# Patient Record
Sex: Female | Born: 1975 | Race: Black or African American | Hispanic: No | Marital: Single | Smoking: Former smoker
Health system: Southern US, Community
[De-identification: ages and names within clinical notes are randomized; demographics above are authoritative.]

## PROBLEM LIST (undated history)

## (undated) DIAGNOSIS — E119 Type 2 diabetes mellitus without complications: Secondary | ICD-10-CM

## (undated) DIAGNOSIS — K750 Abscess of liver: Secondary | ICD-10-CM

## (undated) DIAGNOSIS — I2510 Atherosclerotic heart disease of native coronary artery without angina pectoris: Secondary | ICD-10-CM

## (undated) DIAGNOSIS — K859 Acute pancreatitis without necrosis or infection, unspecified: Secondary | ICD-10-CM

## (undated) DIAGNOSIS — F419 Anxiety disorder, unspecified: Secondary | ICD-10-CM

## (undated) DIAGNOSIS — D259 Leiomyoma of uterus, unspecified: Secondary | ICD-10-CM

## (undated) DIAGNOSIS — R109 Unspecified abdominal pain: Secondary | ICD-10-CM

## (undated) DIAGNOSIS — Z86718 Personal history of other venous thrombosis and embolism: Secondary | ICD-10-CM

## (undated) DIAGNOSIS — I10 Essential (primary) hypertension: Secondary | ICD-10-CM

## (undated) DIAGNOSIS — R0600 Dyspnea, unspecified: Secondary | ICD-10-CM

## (undated) DIAGNOSIS — E079 Disorder of thyroid, unspecified: Secondary | ICD-10-CM

## (undated) DIAGNOSIS — K219 Gastro-esophageal reflux disease without esophagitis: Secondary | ICD-10-CM

## (undated) HISTORY — PX: OTHER SURGICAL HISTORY: SHX169

## (undated) HISTORY — PX: THYROIDECTOMY, PARTIAL: SHX18

---

## 2004-08-27 ENCOUNTER — Emergency Department: Payer: Self-pay | Admitting: Emergency Medicine

## 2006-01-21 ENCOUNTER — Emergency Department: Payer: Self-pay

## 2007-01-06 ENCOUNTER — Emergency Department: Payer: Self-pay | Admitting: Emergency Medicine

## 2017-01-02 DIAGNOSIS — K859 Acute pancreatitis without necrosis or infection, unspecified: Secondary | ICD-10-CM

## 2017-01-02 HISTORY — DX: Acute pancreatitis without necrosis or infection, unspecified: K85.90

## 2018-05-15 ENCOUNTER — Other Ambulatory Visit: Payer: Self-pay

## 2018-05-15 ENCOUNTER — Emergency Department: Payer: Medicaid Other

## 2018-05-15 ENCOUNTER — Emergency Department
Admission: EM | Admit: 2018-05-15 | Discharge: 2018-05-15 | Disposition: A | Discharge status: Home / Self Care | Payer: Medicaid Other | Attending: Emergency Medicine | Admitting: Emergency Medicine

## 2018-05-15 DIAGNOSIS — K861 Other chronic pancreatitis: Secondary | ICD-10-CM | POA: Insufficient documentation

## 2018-05-15 DIAGNOSIS — I2510 Atherosclerotic heart disease of native coronary artery without angina pectoris: Secondary | ICD-10-CM | POA: Insufficient documentation

## 2018-05-15 DIAGNOSIS — I10 Essential (primary) hypertension: Secondary | ICD-10-CM | POA: Diagnosis not present

## 2018-05-15 DIAGNOSIS — I81 Portal vein thrombosis: Secondary | ICD-10-CM

## 2018-05-15 DIAGNOSIS — F17210 Nicotine dependence, cigarettes, uncomplicated: Secondary | ICD-10-CM | POA: Diagnosis not present

## 2018-05-15 DIAGNOSIS — R2243 Localized swelling, mass and lump, lower limb, bilateral: Secondary | ICD-10-CM | POA: Diagnosis present

## 2018-05-15 HISTORY — DX: Atherosclerotic heart disease of native coronary artery without angina pectoris: I25.10

## 2018-05-15 HISTORY — DX: Abscess of liver: K75.0

## 2018-05-15 HISTORY — DX: Disorder of thyroid, unspecified: E07.9

## 2018-05-15 HISTORY — DX: Essential (primary) hypertension: I10

## 2018-05-15 HISTORY — DX: Leiomyoma of uterus, unspecified: D25.9

## 2018-05-15 LAB — URINALYSIS, COMPLETE (UACMP) WITH MICROSCOPIC
Bilirubin Urine: NEGATIVE
Glucose, UA: NEGATIVE mg/dL
Hgb urine dipstick: NEGATIVE
Ketones, ur: NEGATIVE mg/dL
Leukocytes,Ua: NEGATIVE
Nitrite: NEGATIVE
Protein, ur: NEGATIVE mg/dL
Specific Gravity, Urine: 1.006 (ref 1.005–1.030)
pH: 8 (ref 5.0–8.0)

## 2018-05-15 LAB — CBC
HCT: 30.6 % — ABNORMAL LOW (ref 36.0–46.0)
Hemoglobin: 8.9 g/dL — ABNORMAL LOW (ref 12.0–15.0)
MCH: 21.3 pg — ABNORMAL LOW (ref 26.0–34.0)
MCHC: 29.1 g/dL — ABNORMAL LOW (ref 30.0–36.0)
MCV: 73.2 fL — ABNORMAL LOW (ref 80.0–100.0)
Platelets: 407 10*3/uL — ABNORMAL HIGH (ref 150–400)
RBC: 4.18 MIL/uL (ref 3.87–5.11)
RDW: 27 % — ABNORMAL HIGH (ref 11.5–15.5)
WBC: 4.9 10*3/uL (ref 4.0–10.5)
nRBC: 0 % (ref 0.0–0.2)

## 2018-05-15 LAB — COMPREHENSIVE METABOLIC PANEL
ALT: 16 U/L (ref 0–44)
AST: 41 U/L (ref 15–41)
Albumin: 2.6 g/dL — ABNORMAL LOW (ref 3.5–5.0)
Alkaline Phosphatase: 264 U/L — ABNORMAL HIGH (ref 38–126)
Anion gap: 6 (ref 5–15)
BUN: 6 mg/dL (ref 6–20)
CO2: 27 mmol/L (ref 22–32)
Calcium: 8.3 mg/dL — ABNORMAL LOW (ref 8.9–10.3)
Chloride: 107 mmol/L (ref 98–111)
Creatinine, Ser: 0.51 mg/dL (ref 0.44–1.00)
GFR calc Af Amer: >60 mL/min (ref >60)
GFR calc non Af Amer: >60 mL/min (ref >60)
Glucose, Bld: 102 mg/dL — ABNORMAL HIGH (ref 70–99)
Potassium: 3.8 mmol/L (ref 3.5–5.1)
Sodium: 140 mmol/L (ref 135–145)
Total Bilirubin: 0.4 mg/dL (ref 0.3–1.2)
Total Protein: 6.7 g/dL (ref 6.5–8.1)

## 2018-05-15 LAB — LIPASE, BLOOD: Lipase: 170 U/L — ABNORMAL HIGH (ref 11–51)

## 2018-05-15 LAB — POCT PREGNANCY, URINE: Preg Test, Ur: NEGATIVE

## 2018-05-15 MED ORDER — OXYCODONE HCL 5 MG PO TABS
10.0000 mg | ORAL_TABLET | Freq: Once | ORAL | Status: AC
  Administered 2018-05-15: 15:00:00 10 mg via ORAL
  Filled 2018-05-15: qty 2

## 2018-05-15 MED ORDER — IOHEXOL 300 MG/ML  SOLN
100.0000 mL | Freq: Once | INTRAMUSCULAR | Status: AC | PRN
  Administered 2018-05-15: 11:00:00 100 mL via INTRAVENOUS

## 2018-05-15 MED ORDER — ONDANSETRON HCL 4 MG/2ML IJ SOLN
4.0000 mg | Freq: Once | INTRAMUSCULAR | Status: AC
  Administered 2018-05-15: 4 mg via INTRAVENOUS
  Filled 2018-05-15: qty 2

## 2018-05-15 MED ORDER — MORPHINE SULFATE (PF) 4 MG/ML IV SOLN
4.0000 mg | Freq: Once | INTRAVENOUS | Status: AC
  Administered 2018-05-15: 10:00:00 4 mg via INTRAVENOUS
  Filled 2018-05-15: qty 1

## 2018-05-15 MED ORDER — APIXABAN 5 MG PO TABS: 5.0000 mg | ORAL_TABLET | Freq: Two times a day (BID) | ORAL | 2 refills | Status: DC

## 2018-05-15 MED ORDER — SODIUM CHLORIDE 0.9 % IV BOLUS
1000.0000 mL | Freq: Once | INTRAVENOUS | Status: AC
  Administered 2018-05-15: 10:00:00 1000 mL via INTRAVENOUS

## 2018-05-15 MED ORDER — IOHEXOL 240 MG/ML SOLN
50.0000 mL | Freq: Once | INTRAMUSCULAR | Status: AC | PRN
  Administered 2018-05-15: 10:00:00 50 mL via ORAL

## 2018-05-15 MED ORDER — OXYCODONE HCL 5 MG PO TABS: 5.0000 mg | ORAL_TABLET | Freq: Three times a day (TID) | ORAL | 0 refills | Status: DC | PRN

## 2018-05-15 NOTE — ED Notes (Signed)
Pt verbalized understanding of discharge instructions. NAD at this time. 

## 2018-05-15 NOTE — ED Triage Notes (Signed)
Pt just moved back her from [place], was dx with a blood clot of the liver and an abscess in february. States she went back a week later and took all the meds prescribed but is not feeling any better, states she is not able to take the abx due to N/V.. has not taken the eliquis prescribed in a couple weeks. C/o BL LE edema and abd pain.

## 2018-05-15 NOTE — ED Provider Notes (Signed)
[facility] Emergency Department Provider Note  Time seen: 10:04 AM  I have reviewed the triage vital signs and the nursing notes.   HISTORY  Chief Complaint Leg swelling, abdominal pain  HPI Amy Wolfe is a 43 y.o. female with a past medical history of CAD, hypertension, pancreatitis, history of alcohol abuse (9 months sober), recently diagnosed liver abscess, chronic pancreatitis, portal venous thrombosis, presents to the emergency department for abdominal discomfort and lower extremity swelling.  According to the patient she was admitted approximately 1 month ago in [place] to a hospital where she was diagnosed with multiple findings.  Per paper records brought by the patient appears that she was diagnosed with pancreatitis, liver abscess, portal venous thrombosis.  Patient states she was taking blood thinners however she stopped taking them approximately 2 weeks ago when she was discharged from the hospital.  States she did not know she needed to keep taking them.  Patient states she was prescribed pain medication but has since run out of pain medicine.  Patient presents to the emergency department today for moderate diffuse abdominal discomfort somewhat worse across the upper abdomen.  Also increased swelling of her abdomen and lower extremities with occasional tingling sensation in bilateral lower extremities.  Patient denies any fever, cough congestion or shortness of breath.  Patient states after being discharged from the hospital 2 weeks ago she moved to [place] to live with her family, has not followed up with any doctors or specialist in the area.   Past Medical History:  Diagnosis Date  . Coronary artery disease   . Hypertension   . Liver abscess   . Thyroid disease   . Uterine fibroid     There are no active problems to display for this patient.   Past Surgical History:  Procedure Laterality Date  . CESAREAN SECTION    . THYROIDECTOMY,  PARTIAL      Prior to Admission medications   Not on File    No Known Allergies  No family history on file.  Social History Social History   Tobacco Use  . Smoking status: Current Every Day Smoker    Types: Cigarettes  . Smokeless tobacco: Never Used  Substance Use Topics  . Alcohol use: Not Currently  . Drug use: Yes    Types: Marijuana    Review of Systems Constitutional: Negative for fever. ENT: Negative for recent illness/congestion Cardiovascular: Negative for chest pain. Respiratory: Negative for shortness of breath. Gastrointestinal: Positive for abdominal pain.  Negative for vomiting or diarrhea. Genitourinary: Negative for urinary compaints Musculoskeletal: Lower extremity swelling/tingling. Skin: Negative for skin complaints  Neurological: Negative for headache All other ROS negative  ____________________________________________   PHYSICAL EXAM:  VITAL SIGNS: ED Triage Vitals  Enc Vitals Group     BP 05/15/18 0906 (!) 144/108     Pulse Rate 05/15/18 0906 (!) 110     Resp --      Temp 05/15/18 0906 98.7 F (37.1 C)     Temp Source 05/15/18 0906 Oral     SpO2 05/15/18 0906 100 %     Weight 05/15/18 0906 150 lb (68 kg)     Height 05/15/18 0906 5\' 7"  (1.702 m)     Head Circumference --      Peak Flow --      Pain Score 05/15/18 0915 7     Pain Loc --      Pain Edu? --      Excl. in [place]? --  Constitutional: Alert and oriented. Well appearing and in no distress. Eyes: Normal exam ENT      Head: Normocephalic and atraumatic.      Mouth/Throat: Mucous membranes are moist. Cardiovascular: Normal rate, regular rhythm Respiratory: Normal respiratory effort without tachypnea nor retractions. Breath sounds are clear Gastrointestinal: Soft, moderate diffuse tenderness, somewhat worse in the epigastrium.  No rebound guarding or distention. Musculoskeletal: Nontender with normal range of motion in all extremities.  Mild pedal edema  bilaterally. Neurologic:  Normal speech and language. No gross focal neurologic deficits Skin:  Skin is warm, dry and intact.  Psychiatric: Mood and affect are normal  ____________________________________________   RADIOLOGY  Sounds negative for lower extremity DVT.  CT scan shows signs of chronic pancreatitis with pseudocyst.  Nonocclusive thrombus in the portal vein, SMA, occlusive thrombus in the splenic vein.  ____________________________________________   INITIAL IMPRESSION / ASSESSMENT AND PLAN / ED COURSE  Pertinent labs & imaging results that were available during my care of the patient were reviewed by me and considered in my medical decision making (see chart for details).   Patient presents emergency department for abdominal discomfort lower extremity swelling.  Patient appears to have a fairly complex history, history of alcoholism states she is now 9 months sober.  History of recurrent pancreatitis.  Appears the patient was recently diagnosed with a portal venous thrombosis as well as possible liver abscess.  As the patient has no records for review in care everywhere, has no local labs or work-up available we will proceed with lab work CT imaging of the abdomen/pelvis as well as ultrasounds of the lower extremities in hopes of better understanding the patient's current pathology as well as finding cause for her symptoms.  We will treat the patient's pain while awaiting results.  Patient agreeable to plan of care.  CT scan shows thrombus in the portal vein SMA and splenic vein.  Signs of chronic pancreatitis as well as pancreatic pseudocyst.  Moderate ascites.  Patient states she has been 9 months sober from alcohol which is great.  I discussed the patient's findings with Dr. Corene Cornea do a vascular surgery who recommends anticoagulation.  I discussed with the patient she needs to restart her Eliquis.  Patient agreeable to plan.  We will discharge with short course of pain medication.   Patient has all of her other medications and plans to follow-up with medication management.  Patient has several weeks of blood thinner currently but we will prescribe a longer course while the patient gets in with a specialist.  We will refer to GI medicine as well as vascular surgery as well as a primary care doctor.  Patient understands the importance of following up with her doctors as soon as possible.  Sharnice Bosler was evaluated in Emergency Department on 05/15/2018 for the symptoms described in the history of present illness. She was evaluated in the context of the global COVID-19 pandemic, which necessitated consideration that the patient might be at risk for infection with the SARS-CoV-2 virus that causes COVID-19. Institutional protocols and algorithms that pertain to the evaluation of patients at risk for COVID-19 are in a state of rapid change based on information released by regulatory bodies including the CDC and federal and state organizations. These policies and algorithms were followed during the patient's care in the ED.  ____________________________________________   FINAL CLINICAL IMPRESSION(S) / ED DIAGNOSES  Portal venous thrombosis Chronic pancreatitis    Harvest Dark, MD 05/15/18 1432

## 2018-05-15 NOTE — ED Notes (Signed)
Patient transported to CT 

## 2018-05-15 NOTE — ED Notes (Signed)
Pt asking for pain meds, MD Paduchowski made aware.

## 2018-05-15 NOTE — Discharge Instructions (Signed)
Please call the numbers provided to arrange follow-up appointments with a primary care doctor, GI doctor and a vascular surgeon.  Return to the emergency department for any worsening pain or development of fever.  Please take your medications as prescribed by your doctor.  Please begin taking your blood thinner as prescribed twice daily.  Take your pain medication as needed, but only as prescribed.

## 2018-05-17 ENCOUNTER — Other Ambulatory Visit: Payer: Self-pay

## 2018-05-17 ENCOUNTER — Ambulatory Visit (INDEPENDENT_AMBULATORY_CARE_PROVIDER_SITE_OTHER): Payer: Self-pay | Admitting: Vascular Surgery

## 2018-05-17 ENCOUNTER — Encounter (INDEPENDENT_AMBULATORY_CARE_PROVIDER_SITE_OTHER): Payer: Self-pay | Admitting: Vascular Surgery

## 2018-05-17 VITALS — BP 160/111 | HR 87 | Resp 16 | Ht 67.5 in | Wt 155.0 lb

## 2018-05-17 DIAGNOSIS — F17210 Nicotine dependence, cigarettes, uncomplicated: Secondary | ICD-10-CM

## 2018-05-17 DIAGNOSIS — K861 Other chronic pancreatitis: Secondary | ICD-10-CM | POA: Insufficient documentation

## 2018-05-17 DIAGNOSIS — M7989 Other specified soft tissue disorders: Secondary | ICD-10-CM | POA: Insufficient documentation

## 2018-05-17 DIAGNOSIS — I81 Portal vein thrombosis: Secondary | ICD-10-CM

## 2018-05-17 DIAGNOSIS — I10 Essential (primary) hypertension: Secondary | ICD-10-CM | POA: Insufficient documentation

## 2018-05-17 DIAGNOSIS — K862 Cyst of pancreas: Secondary | ICD-10-CM

## 2018-05-17 NOTE — Assessment & Plan Note (Signed)
blood pressure control important in reducing the progression of atherosclerotic disease.  He has run out of her amlodipine and so she asked if we will give her a prescription for this today.  I am going to give her a one-month supply but she knows she will need to find a primary care physician to continue this ongoing.

## 2018-05-17 NOTE — Assessment & Plan Note (Signed)
He has chronic pancreatitis and used to drink heavily but has not drank in 8 months.  Unfortunately, her pancreas is quite diseased and her pancreatitis is quite impressive on the CT scan.  She is to see gastroenterology later this month.  Ultimately, this was the cause of her venous thrombotic issues as well.

## 2018-05-17 NOTE — Assessment & Plan Note (Signed)
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  Some of her areas of thrombosis are partially occlusive and other are completely occlusive.  This would suggest some degree of chronicity to the issue although an acute degree may be present as well.  She had very severe pancreatitis with significant ascites. We discussed the importance of anticoagulation to try to avoid liver damage and portal venous hypertension going forward.  She has had an allergic reaction to the Eliquis, so I am going to switch her over to Xarelto today.  We have given her a prescription for 20 mg daily after she finishes the prescription for 15 mg twice daily for 3 weeks.  I will plan to see her back in about 6 months with a repeat CT scan of the abdomen pelvis to discuss cessation of anticoagulation and transitioning to antiplatelet therapy.  She understands the importance of abstaining from alcohol and following up and having her pancreatitis issues managed.

## 2018-05-17 NOTE — Assessment & Plan Note (Signed)
She has significant lower extremity swelling which is likely multifactorial.  She did not have a DVT.  Her ascites and her potential liver disease from portal venous thrombosis would certainly cause some leg swelling.  I recommend she get compression stockings and elevate her legs.  Increasing her activity will also be of benefit.

## 2018-05-17 NOTE — Progress Notes (Signed)
Patient ID: Amy Wolfe, female   DOB: 04-28-1975, 43 y.o.   MRN: [id number]  Chief Complaint  Patient presents with  . New Patient (Initial Visit)    [facility] ED follow up    HPI Amy Wolfe is a 43 y.o. female.  I am asked to see the patient by Dr. Kerman Passey for evaluation of portal venous thrombosis.  The patient has a long history of chronic pancreatitis and had a flare earlier this week and was seen in the emergency department.  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  Some of her areas of thrombosis are partially occlusive and other are completely occlusive.  This would suggest some degree of chronicity to the issue although an acute degree may be present as well.  She had very severe pancreatitis with significant ascites.  She was discharged from the hospital and is seen in follow-up.  She continues to have severe abdominal pain.  She is having leg swelling and pain.  She also has a markedly elevated blood pressure as she has run out of her blood pressure medicine and has not been taking that.  She was started on Eliquis at the hospital, but has been having an allergic reaction to the Eliquis which include rash, itching, and hives.     Past Medical History:  Diagnosis Date  . Coronary artery disease   . Hypertension   . Liver abscess   . Thyroid disease   . Uterine fibroid   chronic pancreatic issues  Past Surgical History:  Procedure Laterality Date  . CESAREAN SECTION    . THYROIDECTOMY, PARTIAL      Family History Family History  Problem Relation Age of Onset  . Hypertension Mother   . Diabetes Mother   . Cancer Father   . Cancer Paternal Grandmother   . Cancer Paternal Grandfather     Social History Social History   Tobacco Use  . Smoking status: Current Every Day Smoker    Types: Cigarettes  . Smokeless  tobacco: Never Used  Substance Use Topics  . Alcohol use: Not Currently  . Drug use: Yes    Types: Marijuana    No Known Allergies  Current Outpatient Medications  Medication Sig Dispense Refill  . oxyCODONE (ROXICODONE) 5 MG immediate release tablet Take 1 tablet (5 mg total) by mouth every 8 (eight) hours as needed. 20 tablet 0   No current facility-administered medications for this visit.       REVIEW OF SYSTEMS (Negative unless checked)  Constitutional: [] Weight loss  [] Fever  [] Chills Cardiac: [] Chest pain   [] Chest pressure   [] Palpitations   [] Shortness of breath when laying flat   [] Shortness of breath at rest   [] Shortness of breath with exertion. Vascular:  [] Pain in legs with walking   [] Pain in legs at rest   [] Pain in legs when laying flat   [] Claudication   [] Pain in feet when walking  [] Pain in feet at rest  [] Pain in feet when laying flat   [] History of DVT   [] Phlebitis   [x] Swelling in legs   [] Varicose veins   [] Non-healing ulcers Pulmonary:   [] Uses home oxygen   [] Productive cough   [] Hemoptysis   [] Wheeze  [] COPD   [] Asthma Neurologic:  [] Dizziness  [] Blackouts   [] Seizures   [] History of stroke   [] History of  TIA  [] Aphasia   [] Temporary blindness   [] Dysphagia   [] Weakness or numbness in arms   [] Weakness or numbness in legs Musculoskeletal:  [] Arthritis   [] Joint swelling   [] Joint pain   [] Low back pain Hematologic:  [] Easy bruising  [] Easy bleeding   [] Hypercoagulable state   [] Anemic  [] Hepatitis Gastrointestinal:  [] Blood in stool   [] Vomiting blood  [x] Gastroesophageal reflux/heartburn   [x] Abdominal pain Genitourinary:  [] Chronic kidney disease   [] Difficult urination  [] Frequent urination  [] Burning with urination   [] Hematuria Skin:  [] Rashes   [] Ulcers   [] Wounds Psychological:  [] History of anxiety   []  History of major depression.    Physical Exam BP (!) 160/111 (BP Location: Right Arm)   Pulse 87   Resp 16   Ht 5' 7.5" (1.715 m)   Wt 155 lb  (70.3 kg)   LMP 05/02/2018 (Approximate)   BMI 23.92 kg/m  Gen:  WD/WN, NAD Head: [place]/AT, No temporalis wasting. Prominent temp pulse not noted. Ear/Nose/Throat: Hearing grossly intact, nares w/o erythema or drainage, oropharynx w/o Erythema/Exudate Eyes: Conjunctiva clear, sclera non-icteric  Neck: trachea midline.  No JVD.  Pulmonary:  Good air movement, respirations not labored, no use of accessory muscles  Cardiac: RRR, no JVD Vascular:  Vessel Right Left  Radial Palpable Palpable                                   Gastrointestinal:. No masses, surgical incisions, or scars.  Abdomen is tender palpation a particularly in the upper abdomen.  Ascites is present. Musculoskeletal: M/S 5/5 throughout.  Extremities without ischemic changes.  No deformity or atrophy.  1+ bilateral lower extremity edema. Neurologic: Sensation grossly intact in extremities.  Symmetrical.  Speech is fluent. Motor exam as listed above. Psychiatric: Judgment intact, Mood & affect appropriate for pt's clinical situation. Dermatologic: No rashes or ulcers noted.  No cellulitis or open wounds.    Radiology Ct Abdomen Pelvis W Contrast  Result Date: 05/15/2018 CLINICAL DATA:  Right upper quadrant and a bili cul pain for 3 days. Elevated lipase. History of pancreatitis. EXAM: CT ABDOMEN AND PELVIS WITH CONTRAST TECHNIQUE: Multidetector CT imaging of the abdomen and pelvis was performed using the standard protocol following bolus administration of intravenous contrast. CONTRAST:  136mL OMNIPAQUE IOHEXOL 300 MG/ML  SOLN COMPARISON:  None. FINDINGS: Lower chest: Dependent lower lobe atelectasis bilaterally with tiny right pleural effusion. Hepatobiliary: Heterogeneous liver perfusion. 15 mm low-density lesion in the medial right liver posterior to the IVC cannot be definitively characterized. Gallbladder wall has an irregular appearance and appears nodular in some regions. No intrahepatic or extrahepatic biliary  dilation. Pancreas: Pancreatic parenchyma is ill-defined in the head and body of pancreas with some dilatation of the main duct in the body. 2.7 x 2.3 cm low-density lesion in the body of pancreas has attenuation too high to be a simple cyst. Adjacent low-density lesions in the uncinate process measure approximately 17 mm each. There is peripancreatic edema. Spleen: No splenomegaly. No focal mass lesion. Adrenals/Urinary Tract: No adrenal nodule or mass. Kidneys unremarkable. No evidence for hydroureter. The urinary bladder appears normal for the degree of distention. Stomach/Bowel: Stomach is unremarkable. No gastric wall thickening. No evidence of outlet obstruction. Duodenum is normally positioned as is the ligament of Treitz. No small bowel wall thickening. No small bowel dilatation. The terminal ileum is normal. The appendix is normal. No gross colonic mass. No colonic  wall thickening. Vascular/Lymphatic: No abdominal aortic aneurysm. Nonocclusive thrombus in the main portal vein extends into the right portal vein where it appears occlusive. Nonocclusive thrombus identified in the superior mesenteric vein (37/2). Splenic vein appears occluded. There is no gastrohepatic or hepatoduodenal ligament lymphadenopathy. No intraperitoneal or retroperitoneal lymphadenopathy. No pelvic sidewall lymphadenopathy. Reproductive: Large fibroid burden noted in the uterus with multiple exophytic and pedunculated fibroids evident. There is no adnexal mass. Other: Moderate volume ascites noted in the abdomen and pelvis. There is mesenteric edema. Musculoskeletal: Diffuse body wall edema evident. No worrisome lytic or sclerotic osseous abnormality. IMPRESSION: 1. Pancreatic head and body appear enlarged and edematous suggesting pancreatitis. There is probably some parenchymal atrophy in the tail of pancreas with mild ductal dilatation in the body of pancreas. 2.7 cm low-density lesion in the body of pancreas associated with  adjacent 17 mm low-density lesions in the uncinate process. These are likely pseudocysts, but follow-up recommended as neoplasm not excluded. 2. Nonocclusive thrombus in the portal vein extends into the portal vein branch where it becomes occlusive. Nonocclusive thrombus is identified in the superior mesenteric vein in the splenic vein appears occluded. 3. Moderate volume ascites. 4. Gallbladder wall is irregular with areas of apparent nodularity. This is presumably secondary. 5. Diffuse body wall edema. Electronically Signed   By: Misty Stanley M.D.   On: 05/15/2018 11:14   US Venous Img Lower Bilateral  Result Date: 05/15/2018 CLINICAL DATA:  Pain and swelling for 1 week EXAM: BILATERAL LOWER EXTREMITY VENOUS DOPPLER ULTRASOUND TECHNIQUE: Gray-scale sonography with graded compression, as well as color Doppler and duplex ultrasound were performed to evaluate the lower extremity deep venous systems from the level of the common femoral vein and including the common femoral, femoral, profunda femoral, popliteal and calf veins including the posterior tibial, peroneal and gastrocnemius veins when visible. The superficial great saphenous vein was also interrogated. Spectral Doppler was utilized to evaluate flow at rest and with distal augmentation maneuvers in the common femoral, femoral and popliteal veins. COMPARISON:  None. FINDINGS: RIGHT LOWER EXTREMITY Common Femoral Vein: No evidence of thrombus. Normal compressibility, respiratory phasicity and response to augmentation. Saphenofemoral Junction: No evidence of thrombus. Normal compressibility and flow on color Doppler imaging. Profunda Femoral Vein: No evidence of thrombus. Normal compressibility and flow on color Doppler imaging. Femoral Vein: No evidence of thrombus. Normal compressibility, respiratory phasicity and response to augmentation. Popliteal Vein: No evidence of thrombus. Normal compressibility, respiratory phasicity and response to augmentation.  Calf Veins: No evidence of thrombus. Normal compressibility and flow on color Doppler imaging. Superficial Great Saphenous Vein: No evidence of thrombus. Normal compressibility. Venous Reflux:  Not assessed Other Findings:  None. LEFT LOWER EXTREMITY Common Femoral Vein: No evidence of thrombus. Normal compressibility, respiratory phasicity and response to augmentation. Saphenofemoral Junction: No evidence of thrombus. Normal compressibility and flow on color Doppler imaging. Profunda Femoral Vein: No evidence of thrombus. Normal compressibility and flow on color Doppler imaging. Femoral Vein: No evidence of thrombus. Normal compressibility, respiratory phasicity and response to augmentation. Popliteal Vein: No evidence of thrombus. Normal compressibility, respiratory phasicity and response to augmentation. Calf Veins: No evidence of thrombus. Normal compressibility and flow on color Doppler imaging. Superficial Great Saphenous Vein: No evidence of thrombus. Normal compressibility. Venous Reflux:  Not assessed Other Findings:  None. IMPRESSION: No evidence of significant deep venous thrombosis in either lower extremity. Electronically Signed   By: Jerilynn Mages.  Shick M.D.   On: 05/15/2018 12:40    Labs Recent Results (from the  past 2160 hour(s))  CBC     Status: Abnormal   Collection Time: 05/15/18  9:59 AM  Result Value Ref Range   WBC 4.9 4.0 - 10.5 K/uL   RBC 4.18 3.87 - 5.11 MIL/uL   Hemoglobin 8.9 (L) 12.0 - 15.0 g/dL    Comment: Reticulocyte Hemoglobin testing may be clinically indicated, consider ordering this additional test [id number]    HCT 30.6 (L) 36.0 - 46.0 %   MCV 73.2 (L) 80.0 - 100.0 fL   MCH 21.3 (L) 26.0 - 34.0 pg   MCHC 29.1 (L) 30.0 - 36.0 g/dL   RDW 27.0 (H) 11.5 - 15.5 %   Platelets 407 (H) 150 - 400 K/uL   nRBC 0.0 0.0 - 0.2 %    Comment: Performed at [facility], [address] [place], [place] [address]  Comprehensive metabolic panel     Status: Abnormal    Collection Time: 05/15/18  9:59 AM  Result Value Ref Range   Sodium 140 135 - 145 mmol/L   Potassium 3.8 3.5 - 5.1 mmol/L    Comment: HEMOLYSIS AT THIS LEVEL MAY AFFECT RESULT   Chloride 107 98 - 111 mmol/L   CO2 27 22 - 32 mmol/L   Glucose, Bld 102 (H) 70 - 99 mg/dL   BUN 6 6 - 20 mg/dL   Creatinine, Ser 0.51 0.44 - 1.00 mg/dL   Calcium 8.3 (L) 8.9 - 10.3 mg/dL   Total Protein 6.7 6.5 - 8.1 g/dL   Albumin 2.6 (L) 3.5 - 5.0 g/dL   AST 41 15 - 41 U/L   ALT 16 0 - 44 U/L   Alkaline Phosphatase 264 (H) 38 - 126 U/L   Total Bilirubin 0.4 0.3 - 1.2 mg/dL   GFR calc non Af Amer >60 >60 mL/min   GFR calc Af Amer >60 >60 mL/min   Anion gap 6 5 - 15    Comment: Performed at [facility], [address] [place], [place] [address]  Lipase, blood     Status: Abnormal   Collection Time: 05/15/18  9:59 AM  Result Value Ref Range   Lipase 170 (H) 11 - 51 U/L    Comment: Performed at [facility], [address] [place], [place] [address]  Urinalysis, Complete w Microscopic     Status: Abnormal   Collection Time: 05/15/18 10:40 AM  Result Value Ref Range   Color, Urine YELLOW (A) YELLOW   APPearance CLEAR (A) CLEAR   Specific Gravity, Urine 1.006 1.005 - 1.030   pH 8.0 5.0 - 8.0   Glucose, UA NEGATIVE NEGATIVE mg/dL   Hgb urine dipstick NEGATIVE NEGATIVE   Bilirubin Urine NEGATIVE NEGATIVE   Ketones, ur NEGATIVE NEGATIVE mg/dL   Protein, ur NEGATIVE NEGATIVE mg/dL   Nitrite NEGATIVE NEGATIVE   Leukocytes,Ua NEGATIVE NEGATIVE   WBC, UA 0-5 0 - 5 WBC/hpf   Bacteria, UA RARE (A) NONE SEEN   Squamous Epithelial / LPF 0-5 0 - 5   Mucus PRESENT     Comment: Performed at [facility], [address] [place],  [address]  Pregnancy, urine POC     Status: None   Collection Time: 05/15/18 10:42 AM  Result Value Ref Range   Preg Test, Ur NEGATIVE NEGATIVE    Comment:        THE SENSITIVITY OF THIS METHODOLOGY IS >24 mIU/mL     Assessment/Plan:   Swelling of limb She has significant lower extremity swelling which  is likely multifactorial.  She did not have a DVT.  Her ascites and her potential liver disease from portal venous thrombosis would certainly cause some leg swelling.  I recommend she get compression stockings and elevate her legs.  Increasing her activity will also be of benefit.  Essential hypertension blood pressure control important in reducing the progression of atherosclerotic disease.  He has run out of her amlodipine and so she asked if we will give her a prescription for this today.  I am going to give her a one-month supply but she knows she will need to find a primary care physician to continue this ongoing.   Pancreatitis, chronic ([place]) He has chronic pancreatitis and used to drink heavily but has not drank in 8 months.  Unfortunately, her pancreas is quite diseased and her pancreatitis is quite impressive on the CT scan.  She is to see gastroenterology later this month.  Ultimately, this was the cause of her venous thrombotic issues as well.  Portal vein thrombosis  She had a CT scan of the abdomen pelvis which I have independently reviewed which does demonstrate fairly significant portal venous thrombosis as well as thrombosis of the SMV and splenic vein.  Some of her areas of thrombosis are partially occlusive and other are completely occlusive.  This would suggest some degree of chronicity to the issue although an acute degree may be present as well.  She had very severe pancreatitis with significant ascites. We discussed the importance of anticoagulation to try to avoid liver damage and portal venous hypertension going forward.  She has had an allergic reaction to the Eliquis, so I am going to switch her over to Xarelto today.  We have given her a prescription for 20 mg daily after she finishes the prescription for 15 mg twice daily for 3 weeks.  I will plan to see her back in about 6 months with a repeat CT scan of the  abdomen pelvis to discuss cessation of anticoagulation and transitioning to antiplatelet therapy.  She understands the importance of abstaining from alcohol and following up and having her pancreatitis issues managed.      Leotis Pain 05/17/2018, 4:12 PM   This note was created with Dragon medical transcription system.  Any errors from dictation are unintentional.

## 2018-05-17 NOTE — Patient Instructions (Signed)
Chronic Pancreatitis    Chronic pancreatitis is long-lasting inflammation and scarring of the pancreas. The pancreas is a gland that is located behind the stomach. It makes enzymes that help to digest food. The pancreas also releases hormones called glucagon and insulin, which help regulate blood sugar (glucose). Damage to the pancreas may affect digestion, cause pain in the upper abdomen and back, and cause diabetes. Inflammation can also irritate other organs in the abdomen near the pancreas.  At first, pancreatitis may be sudden (acute). If you have several or prolonged episodes of acute pancreatitis, the condition can turn into chronic pancreatitis.  What are the causes?  The most common cause of this condition is alcohol abuse. Other causes include:  · High (elevated) levels of triglycerides in the blood (hypertriglyceridemia).  · Gallstones or other conditions that can block the tube that drains the pancreas (pancreatic duct).  · Pancreatic cancer.  · Cystic fibrosis.  · Too much calcium in the blood (hypercalcemia), which may be caused by an overactive parathyroid gland (hyperparathyroidism).  · Certain medicines.  · Injury to the pancreas.  · Infection.  · Autoimmune pancreatitis. This is when the body's disease-fighting (immune) system attacks the pancreas.  · Genes that are passed from parent to child (inherited).  In some cases, the cause may not be known.  What increases the risk?  This condition is more likely to develop in:  · Men.  · People who are 35-55 years old.  · People who have a family history of pancreatitis.  · People who smoke tobacco.  · People who drink large amounts of alcohol over a long period of time.  What are the signs or symptoms?  Symptoms of this condition may include:  · Pain in the abdomen or upper back. Pain may get worse after eating.  · Nausea and vomiting.  · Fever.  · Weight loss.  · A change in the color and consistency of bowel movements, such as stools that are oily,  fatty, or clay-colored.  How is this diagnosed?  This condition is diagnosed based on your symptoms, your medical history, and a physical exam. You may have tests, such as:  · Blood tests.  · Stool samples.  · Biopsy of the pancreas. This is the removal of a small amount of pancreas tissue to be tested in a lab.  · Imaging tests, such as:  ? X-rays.  ? CT scan.  ? MRI.  ? Ultrasound.  How is this treated?  You may need to be treated at a hospital. Treatment may involve:  · Resting the pancreas. You may need to stop eating and drinking for a few days to give your pancreas time to recover. During this time, you will be given IV fluids to keep you hydrated.  · Controlling pain. You may be given pain medicines by mouth (orally) or as injections.  · Improving digestion. You may be given:  ? Medicines to replace your pancreatic enzymes.  ? Vitamin supplements.  ? A specific diet to follow. You may work with a diet and nutrition specialist (dietitian) to make an eating plan.  · Surgery to:  ? Clear the pancreatic ducts of any blockages, such as gallstones.  ? Remove any fluid or damaged tissue from the pancreas.  Other treatments may include:  · Preventing diabetes. Your health care provider may recommend that you:  ? Get regular screening tests for diabetes.  ? Monitor your blood glucose regularly.  · Lifestyle changes, such   as stopping alcohol use.  · Steroid medicines, if your condition is caused by your immune system attacking your body's own tissues (autoimmune disease).  Follow these instructions at home:  Eating and drinking         · Do not drink alcohol. If you need help quitting, ask your health care provider.  · Follow a diet as told by your health care provider or dietitian, if this applies. This may include:  ? Limiting how much fat you eat.  ? Eating smaller meals more often.  ? Avoiding caffeine.  · Drink enough fluid to keep your urine pale yellow.  General instructions  · Take over-the-counter and  prescription medicines only as told by your health care provider. These include vitamin supplements.  · Do not drive or use heavy machinery while taking prescription pain medicine.  · If you are taking prescription pain medicine, take actions to prevent or treat constipation. Your health care provider may recommend that you:  ? Take an over-the-counter or prescription medicine for constipation.  ? Eat foods that are high in fiber such as whole grains and beans.  ? Limit foods that are high in fat and processed sugars, such as fried or sweet foods.  · Do not use any products that contain nicotine or tobacco, such as cigarettes and e-cigarettes. If you need help quitting, ask your health care provider.  · If recommended by your health care provider, monitor your blood glucose at home.  · Keep all follow-up visits as told by your health care provider. This is important.  Contact a health care provider if:  · You have pain that does not get better with medicine.  · You have a fever.  · You have sudden weight loss.  Get help right away if:  · Your pain suddenly gets worse.  · You have sudden swelling in your abdomen.  · You start to vomit often.  · You vomit blood.  · You have diarrhea that does not go away.  · You have blood in your stool.  · You become confused or you have trouble thinking clearly.  Summary  · Chronic pancreatitis is long-lasting inflammation and scarring of the pancreas. Damage to the pancreas may affect digestion, cause pain in the upper abdomen and back, and cause diabetes. Inflammation can also irritate other organs in the abdomen near the pancreas.  · Common causes of this condition are alcohol abuse, gallstones, high (elevated) levels of triglycerides, and certain medicines.  · This condition is sometimes treated at a hospital and may involve resting the pancreas, controlling pain, replacing enzymes, and avoiding alcohol.  This information is not intended to replace advice given to you by your  health care provider. Make sure you discuss any questions you have with your health care provider.  Document Released: 01/15/2015 Document Revised: 08/18/2016 Document Reviewed: 08/18/2016  Elsevier Interactive Patient Education © 2019 [employer]

## 2018-05-28 ENCOUNTER — Ambulatory Visit: Payer: Self-pay | Admitting: Gastroenterology

## 2018-05-28 ENCOUNTER — Encounter: Payer: Self-pay | Admitting: Gastroenterology

## 2018-05-28 ENCOUNTER — Other Ambulatory Visit: Payer: Self-pay

## 2018-05-28 VITALS — BP 122/82 | HR 92 | Temp 98.5°F | Ht 67.5 in | Wt 147.2 lb

## 2018-05-28 DIAGNOSIS — K861 Other chronic pancreatitis: Secondary | ICD-10-CM

## 2018-05-28 DIAGNOSIS — R748 Abnormal levels of other serum enzymes: Secondary | ICD-10-CM

## 2018-05-28 NOTE — Patient Instructions (Addendum)
You are scheduled for an MRI abdominal with and without contrast at [facility] on Friday, May 29th at 3:00pm. Please arrive at the medical mall registration desk at 2:30pm. You cannot have anything to eat or drink 4 hours prior.   If you need to reschedule this appointment for any reason, please contact central scheduling at [phone].

## 2018-05-28 NOTE — Progress Notes (Signed)
Gastroenterology Consultation  Referring Provider:     Harvest Dark, MD Primary Care Physician:  Patient, [name] Primary Gastroenterologist:  Dr. Allen Norris     Reason for Consultation:     Pancreatic cyst        HPI:   Amy Wolfe is a 43 y.o. y/o female referred for consultation & management of pancreatic cyst by Dr. Patient, [name].  This patient was recently in the emergency room with a history of  pancreatitis, alcohol abuse (9 months sober), recently diagnosed liver abscess, chronic pancreatitis, portal venous thrombosis, presents to the emergency department for abdominal discomfort and lower extremity swelling.  The patient had imaging that showed her to have a nodular liver with ascites in addition to pancreatitis.  The patient was seen in the ER on 5/13 of this year and had a lipase that was elevated at 170.  The patient was also noted to have an increased alkaline phosphatase of 264.  The patient was reported to be in the hospital in [place] about a month and a half ago and put on anticoagulation because of the portal vein thrombosis.  The patient stated that she stopped the anticoagulation because she thought she did not need to take it anymore.  The patient was evaluated by vascular surgery and Dr. Lucky Cowboy suggested that the patient be on anticoagulation.  The patient CT scan of the abdomen showed:  IMPRESSION: 1. Pancreatic head and body appear enlarged and edematous suggesting pancreatitis. There is probably some parenchymal atrophy in the tail of pancreas with mild ductal dilatation in the body of pancreas. 2.7 cm low-density lesion in the body of pancreas associated with adjacent 17 mm low-density lesions in the uncinate process. These are likely pseudocysts, but follow-up recommended as neoplasm not excluded. 2. Nonocclusive thrombus in the portal vein extends into the portal vein branch where it becomes occlusive. Nonocclusive thrombus is identified in the superior  mesenteric vein in the splenic vein appears occluded. 3. Moderate volume ascites. 4. Gallbladder wall is irregular with areas of apparent nodularity. This is presumably secondary. 5. Diffuse body wall edema.  Her lab work also showed her to have a low albumin at 2.6.  The patient also had a CBC that showed what appeared to be a microcytic anemia with a low MCV and a hemoglobin of 8.9.  The patient reports that her diarrhea which was greasy and floated in the water has greatly improved after she was started on some pancreatic enzyme replacement.  She does report that she has gone from a size 18 to a size 8 over the last year.  There is no report of any black stools or bloody stools.  She also denies that she has been told that she has cirrhosis.  Her abdominal pain is worse when she moves around and bends over.  She also reports that it could be worse when she eats also.  Past Medical History:  Diagnosis Date  . Coronary artery disease   . Hypertension   . Liver abscess   . Thyroid disease   . Uterine fibroid     Past Surgical History:  Procedure Laterality Date  . CESAREAN SECTION    . THYROIDECTOMY, PARTIAL      Prior to Admission medications   Medication Sig Start Date End Date Taking? Authorizing Provider  amLODipine (NORVASC) 5 MG tablet Take 5 mg by mouth daily. 05/17/18   [provider]  oxyCODONE (ROXICODONE) 5 MG immediate release tablet Take  1 tablet (5 mg total) by mouth every 8 (eight) hours as needed. 05/15/18 05/15/19  Harvest Dark, MD  XARELTO 15 MG TABS tablet TAKE 1 TABLET BY MOUTH TWICE DAILY FOR 3 WEEKS 05/17/18   [provider]    Family History  Problem Relation Age of Onset  . Hypertension Mother   . Diabetes Mother   . Cancer Father   . Cancer Paternal Grandmother   . Cancer Paternal Grandfather      Social History   Tobacco Use  . Smoking status: Current Every Day Smoker    Types: Cigarettes  . Smokeless tobacco: Never Used   Substance Use Topics  . Alcohol use: Not Currently  . Drug use: Yes    Types: Marijuana    Allergies as of 05/28/2018  . (No Known Allergies)    Review of Systems:    All systems reviewed and negative except where noted in HPI.   Physical Exam:  LMP 05/02/2018 (Approximate)  Patient's last menstrual period was 05/02/2018 (approximate). General:   Alert,  Well-developed, well-nourished, pleasant and cooperative in NAD Head:  Normocephalic and atraumatic. Eyes:  Sclera clear, no icterus.   Conjunctiva pink. Ears:  Normal auditory acuity. Nose:  No deformity, discharge, or lesions. Neck:  Supple; no masses or thyromegaly. Lungs:  Respirations even and unlabored.  Clear throughout to auscultation.   No wheezes, crackles, or rhonchi. No acute distress. Heart:  Regular rate and rhythm; no murmurs, clicks, rubs, or gallops. Abdomen:  Normal bowel sounds.  No bruits.  Soft, positive tenderness to one finger palpation while flexing the abdominal wall muscles and non-distended without masses, hepatosplenomegaly or hernias noted.  No guarding or rebound tenderness.  Positive Carnett sign.   Rectal:  Deferred.  Msk:  Symmetrical without gross deformities.  Good, equal movement & strength bilaterally. Pulses:  Normal pulses noted. Extremities:  No clubbing or edema.  No cyanosis. Neurologic:  Alert and oriented x3;  grossly normal neurologically. Skin:  Intact without significant lesions or rashes.  No jaundice. Lymph Nodes:  No significant cervical adenopathy. Psych:  Alert and cooperative. Normal mood and affect.  Imaging Studies: Ct Abdomen Pelvis W Contrast  Result Date: 05/15/2018 CLINICAL DATA:  Right upper quadrant and a bili cul pain for 3 days. Elevated lipase. History of pancreatitis. EXAM: CT ABDOMEN AND PELVIS WITH CONTRAST TECHNIQUE: Multidetector CT imaging of the abdomen and pelvis was performed using the standard protocol following bolus administration of intravenous  contrast. CONTRAST:  150mL OMNIPAQUE IOHEXOL 300 MG/ML  SOLN COMPARISON:  None. FINDINGS: Lower chest: Dependent lower lobe atelectasis bilaterally with tiny right pleural effusion. Hepatobiliary: Heterogeneous liver perfusion. 15 mm low-density lesion in the medial right liver posterior to the IVC cannot be definitively characterized. Gallbladder wall has an irregular appearance and appears nodular in some regions. No intrahepatic or extrahepatic biliary dilation. Pancreas: Pancreatic parenchyma is ill-defined in the head and body of pancreas with some dilatation of the main duct in the body. 2.7 x 2.3 cm low-density lesion in the body of pancreas has attenuation too high to be a simple cyst. Adjacent low-density lesions in the uncinate process measure approximately 17 mm each. There is peripancreatic edema. Spleen: No splenomegaly. No focal mass lesion. Adrenals/Urinary Tract: No adrenal nodule or mass. Kidneys unremarkable. No evidence for hydroureter. The urinary bladder appears normal for the degree of distention. Stomach/Bowel: Stomach is unremarkable. No gastric wall thickening. No evidence of outlet obstruction. Duodenum is normally positioned as is the ligament of Treitz.  No small bowel wall thickening. No small bowel dilatation. The terminal ileum is normal. The appendix is normal. No gross colonic mass. No colonic wall thickening. Vascular/Lymphatic: No abdominal aortic aneurysm. Nonocclusive thrombus in the main portal vein extends into the right portal vein where it appears occlusive. Nonocclusive thrombus identified in the superior mesenteric vein (37/2). Splenic vein appears occluded. There is no gastrohepatic or hepatoduodenal ligament lymphadenopathy. No intraperitoneal or retroperitoneal lymphadenopathy. No pelvic sidewall lymphadenopathy. Reproductive: Large fibroid burden noted in the uterus with multiple exophytic and pedunculated fibroids evident. There is no adnexal mass. Other: Moderate  volume ascites noted in the abdomen and pelvis. There is mesenteric edema. Musculoskeletal: Diffuse body wall edema evident. No worrisome lytic or sclerotic osseous abnormality. IMPRESSION: 1. Pancreatic head and body appear enlarged and edematous suggesting pancreatitis. There is probably some parenchymal atrophy in the tail of pancreas with mild ductal dilatation in the body of pancreas. 2.7 cm low-density lesion in the body of pancreas associated with adjacent 17 mm low-density lesions in the uncinate process. These are likely pseudocysts, but follow-up recommended as neoplasm not excluded. 2. Nonocclusive thrombus in the portal vein extends into the portal vein branch where it becomes occlusive. Nonocclusive thrombus is identified in the superior mesenteric vein in the splenic vein appears occluded. 3. Moderate volume ascites. 4. Gallbladder wall is irregular with areas of apparent nodularity. This is presumably secondary. 5. Diffuse body wall edema. Electronically Signed   By: Misty Stanley M.D.   On: 05/15/2018 11:14   US Venous Img Lower Bilateral  Result Date: 05/15/2018 CLINICAL DATA:  Pain and swelling for 1 week EXAM: BILATERAL LOWER EXTREMITY VENOUS DOPPLER ULTRASOUND TECHNIQUE: Gray-scale sonography with graded compression, as well as color Doppler and duplex ultrasound were performed to evaluate the lower extremity deep venous systems from the level of the common femoral vein and including the common femoral, femoral, profunda femoral, popliteal and calf veins including the posterior tibial, peroneal and gastrocnemius veins when visible. The superficial great saphenous vein was also interrogated. Spectral Doppler was utilized to evaluate flow at rest and with distal augmentation maneuvers in the common femoral, femoral and popliteal veins. COMPARISON:  None. FINDINGS: RIGHT LOWER EXTREMITY Common Femoral Vein: No evidence of thrombus. Normal compressibility, respiratory phasicity and response to  augmentation. Saphenofemoral Junction: No evidence of thrombus. Normal compressibility and flow on color Doppler imaging. Profunda Femoral Vein: No evidence of thrombus. Normal compressibility and flow on color Doppler imaging. Femoral Vein: No evidence of thrombus. Normal compressibility, respiratory phasicity and response to augmentation. Popliteal Vein: No evidence of thrombus. Normal compressibility, respiratory phasicity and response to augmentation. Calf Veins: No evidence of thrombus. Normal compressibility and flow on color Doppler imaging. Superficial Great Saphenous Vein: No evidence of thrombus. Normal compressibility. Venous Reflux:  Not assessed Other Findings:  None. LEFT LOWER EXTREMITY Common Femoral Vein: No evidence of thrombus. Normal compressibility, respiratory phasicity and response to augmentation. Saphenofemoral Junction: No evidence of thrombus. Normal compressibility and flow on color Doppler imaging. Profunda Femoral Vein: No evidence of thrombus. Normal compressibility and flow on color Doppler imaging. Femoral Vein: No evidence of thrombus. Normal compressibility, respiratory phasicity and response to augmentation. Popliteal Vein: No evidence of thrombus. Normal compressibility, respiratory phasicity and response to augmentation. Calf Veins: No evidence of thrombus. Normal compressibility and flow on color Doppler imaging. Superficial Great Saphenous Vein: No evidence of thrombus. Normal compressibility. Venous Reflux:  Not assessed Other Findings:  None. IMPRESSION: No evidence of significant deep venous thrombosis in either  lower extremity. Electronically Signed   By: Jerilynn Mages.  Shick M.D.   On: 05/15/2018 12:40    Assessment and Plan:   Kathy Wahid is a 43 y.o. y/o female who was referred over from the ER after being seen there for abdominal distention and anasarca hypoalbuminemia pancreatitis with questionable cirrhosis and a diagnosis of portal vein thrombosis.  The patient was put  on anticoagulation and came to see me.  The patient has been anemic but states that she has very heavy periods since starting her anticoagulation and denies any family history or personal history of colon polyps or colon cancer.  The patient's diarrhea has improved greatly since being put on pancreatic enzyme replacement.  She presently is on a low dose and has been told to take 2 pills with meals and 1 pill with snacks.  I will also send off an MRI of the liver and pancreas to better delineate the patient cystic lesions.  Because of the question of fibrosis or cirrhosis I will send her for blood work to include a fibroSure, LFTs, hepatitis A, B and C.  The patient also has elevation of her alkaline phosphatase and will have an antimitochondrial antibody sent off.  The patient has been explained the plan and agrees with it.  Lucilla Lame, MD. Marval Regal    Note: This dictation was prepared with Dragon dictation along with smaller phrase technology. Any transcriptional errors that result from this process are unintentional.

## 2018-05-31 ENCOUNTER — Other Ambulatory Visit
Admission: RE | Admit: 2018-05-31 | Discharge: 2018-05-31 | Disposition: A | Discharge status: Home / Self Care | Payer: Medicaid Other | Source: Home / Self Care | Attending: Gastroenterology | Admitting: Gastroenterology

## 2018-05-31 ENCOUNTER — Other Ambulatory Visit: Payer: Self-pay

## 2018-05-31 ENCOUNTER — Ambulatory Visit
Admission: RE | Admit: 2018-05-31 | Discharge: 2018-05-31 | Disposition: A | Discharge status: Home / Self Care | Payer: Medicaid Other | Source: Ambulatory Visit | Attending: Gastroenterology | Admitting: Gastroenterology

## 2018-05-31 DIAGNOSIS — K861 Other chronic pancreatitis: Secondary | ICD-10-CM

## 2018-05-31 DIAGNOSIS — R748 Abnormal levels of other serum enzymes: Secondary | ICD-10-CM

## 2018-05-31 LAB — HEPATIC FUNCTION PANEL
ALT: 19 U/L (ref 0–44)
AST: 27 U/L (ref 15–41)
Albumin: 3.3 g/dL — ABNORMAL LOW (ref 3.5–5.0)
Alkaline Phosphatase: 190 U/L — ABNORMAL HIGH (ref 38–126)
Bilirubin, Direct: <0.1 mg/dL (ref 0.0–0.2)
Total Bilirubin: 0.3 mg/dL (ref 0.3–1.2)
Total Protein: 7.3 g/dL (ref 6.5–8.1)

## 2018-05-31 MED ORDER — GADOBUTROL 1 MMOL/ML IV SOLN
6.0000 mL | Freq: Once | INTRAVENOUS | Status: AC | PRN
  Administered 2018-05-31: 6 mL via INTRAVENOUS

## 2018-06-01 LAB — HEPATITIS A ANTIBODY, TOTAL: hep A Total Ab: NEGATIVE

## 2018-06-01 LAB — HEPATITIS B SURFACE ANTIGEN: Hepatitis B Surface Ag: NEGATIVE

## 2018-06-01 LAB — HEPATITIS C ANTIBODY: HCV Ab: 0.2 s/co ratio (ref 0.0–0.9)

## 2018-06-01 LAB — HEPATITIS B SURFACE ANTIBODY,QUALITATIVE: Hep B S Ab: NONREACTIVE

## 2018-06-04 LAB — HCV FIBROSURE
ALPHA 2-MACROGLOBULINS, QN: 220 mg/dL (ref 110–276)
ALT (SGPT) P5P: 20 IU/L (ref 0–40)
Apolipoprotein A-1: 167 mg/dL (ref 116–209)
Bilirubin, Total: 0.1 mg/dL (ref 0.0–1.2)
Fibrosis Score: 0.08 (ref 0.00–0.21)
GGT: 369 IU/L — ABNORMAL HIGH (ref 0–60)
Haptoglobin: 272 mg/dL (ref 33–278)
Necroinflammat Activity Score: 0.06 (ref 0.00–0.17)

## 2018-06-04 LAB — MITOCHONDRIAL ANTIBODIES: Mitochondrial M2 Ab, IgG: <20 Units (ref 0.0–20.0)

## 2018-06-13 ENCOUNTER — Other Ambulatory Visit: Payer: Self-pay

## 2018-06-13 ENCOUNTER — Inpatient Hospital Stay
Admission: EM | Admit: 2018-06-13 | Discharge: 2018-06-17 | DRG: 749 | Disposition: A | Discharge status: Home / Self Care | Payer: Medicaid Other | Attending: Internal Medicine | Admitting: Internal Medicine

## 2018-06-13 ENCOUNTER — Telehealth: Payer: Self-pay

## 2018-06-13 ENCOUNTER — Encounter: Payer: Self-pay | Admitting: Emergency Medicine

## 2018-06-13 DIAGNOSIS — Z20828 Contact with and (suspected) exposure to other viral communicable diseases: Secondary | ICD-10-CM | POA: Diagnosis present

## 2018-06-13 DIAGNOSIS — D649 Anemia, unspecified: Secondary | ICD-10-CM | POA: Diagnosis not present

## 2018-06-13 DIAGNOSIS — N938 Other specified abnormal uterine and vaginal bleeding: Secondary | ICD-10-CM | POA: Diagnosis present

## 2018-06-13 DIAGNOSIS — I81 Portal vein thrombosis: Secondary | ICD-10-CM | POA: Diagnosis present

## 2018-06-13 DIAGNOSIS — N939 Abnormal uterine and vaginal bleeding, unspecified: Secondary | ICD-10-CM

## 2018-06-13 DIAGNOSIS — Z79899 Other long term (current) drug therapy: Secondary | ICD-10-CM

## 2018-06-13 DIAGNOSIS — Z7901 Long term (current) use of anticoagulants: Secondary | ICD-10-CM | POA: Diagnosis not present

## 2018-06-13 DIAGNOSIS — F1290 Cannabis use, unspecified, uncomplicated: Secondary | ICD-10-CM | POA: Diagnosis present

## 2018-06-13 DIAGNOSIS — Z9289 Personal history of other medical treatment: Secondary | ICD-10-CM | POA: Diagnosis not present

## 2018-06-13 DIAGNOSIS — D62 Acute posthemorrhagic anemia: Secondary | ICD-10-CM | POA: Diagnosis present

## 2018-06-13 DIAGNOSIS — I2510 Atherosclerotic heart disease of native coronary artery without angina pectoris: Secondary | ICD-10-CM | POA: Diagnosis present

## 2018-06-13 DIAGNOSIS — I959 Hypotension, unspecified: Secondary | ICD-10-CM | POA: Diagnosis present

## 2018-06-13 DIAGNOSIS — R109 Unspecified abdominal pain: Secondary | ICD-10-CM

## 2018-06-13 DIAGNOSIS — D259 Leiomyoma of uterus, unspecified: Secondary | ICD-10-CM | POA: Diagnosis present

## 2018-06-13 DIAGNOSIS — K861 Other chronic pancreatitis: Secondary | ICD-10-CM | POA: Diagnosis present

## 2018-06-13 DIAGNOSIS — Z8249 Family history of ischemic heart disease and other diseases of the circulatory system: Secondary | ICD-10-CM

## 2018-06-13 DIAGNOSIS — K8681 Exocrine pancreatic insufficiency: Secondary | ICD-10-CM | POA: Diagnosis present

## 2018-06-13 DIAGNOSIS — E890 Postprocedural hypothyroidism: Secondary | ICD-10-CM | POA: Diagnosis present

## 2018-06-13 DIAGNOSIS — I10 Essential (primary) hypertension: Secondary | ICD-10-CM | POA: Diagnosis present

## 2018-06-13 DIAGNOSIS — F17210 Nicotine dependence, cigarettes, uncomplicated: Secondary | ICD-10-CM | POA: Diagnosis present

## 2018-06-13 HISTORY — DX: Personal history of other venous thrombosis and embolism: Z86.718

## 2018-06-13 HISTORY — DX: Acute pancreatitis without necrosis or infection, unspecified: K85.90

## 2018-06-13 LAB — CBC
HCT: 15.7 % — ABNORMAL LOW (ref 36.0–46.0)
Hemoglobin: 4.5 g/dL — CL (ref 12.0–15.0)
MCH: 20.2 pg — ABNORMAL LOW (ref 26.0–34.0)
MCHC: 28.7 g/dL — ABNORMAL LOW (ref 30.0–36.0)
MCV: 70.4 fL — ABNORMAL LOW (ref 80.0–100.0)
Platelets: 334 10*3/uL (ref 150–400)
RBC: 2.23 MIL/uL — ABNORMAL LOW (ref 3.87–5.11)
RDW: 22.1 % — ABNORMAL HIGH (ref 11.5–15.5)
WBC: 5.5 10*3/uL (ref 4.0–10.5)
nRBC: 0 % (ref 0.0–0.2)

## 2018-06-13 LAB — COMPREHENSIVE METABOLIC PANEL
ALT: 10 U/L (ref 0–44)
AST: 14 U/L — ABNORMAL LOW (ref 15–41)
Albumin: 3.2 g/dL — ABNORMAL LOW (ref 3.5–5.0)
Alkaline Phosphatase: 96 U/L (ref 38–126)
Anion gap: 6 (ref 5–15)
BUN: 10 mg/dL (ref 6–20)
CO2: 24 mmol/L (ref 22–32)
Calcium: 8.6 mg/dL — ABNORMAL LOW (ref 8.9–10.3)
Chloride: 104 mmol/L (ref 98–111)
Creatinine, Ser: 0.48 mg/dL (ref 0.44–1.00)
GFR calc Af Amer: >60 mL/min (ref >60)
GFR calc non Af Amer: >60 mL/min (ref >60)
Glucose, Bld: 130 mg/dL — ABNORMAL HIGH (ref 70–99)
Potassium: 3.7 mmol/L (ref 3.5–5.1)
Sodium: 134 mmol/L — ABNORMAL LOW (ref 135–145)
Total Bilirubin: 0.2 mg/dL — ABNORMAL LOW (ref 0.3–1.2)
Total Protein: 6.4 g/dL — ABNORMAL LOW (ref 6.5–8.1)

## 2018-06-13 LAB — ABO/RH: ABO/RH(D): O POS

## 2018-06-13 LAB — SARS CORONAVIRUS 2 BY RT PCR (HOSPITAL ORDER, PERFORMED IN ~~LOC~~ HOSPITAL LAB): SARS Coronavirus 2: NEGATIVE

## 2018-06-13 LAB — LIPASE, BLOOD: Lipase: 49 U/L (ref 11–51)

## 2018-06-13 LAB — PREPARE RBC (CROSSMATCH)

## 2018-06-13 MED ORDER — SODIUM CHLORIDE 0.9% FLUSH: 3.0000 mL | INTRAVENOUS | Status: DC | PRN

## 2018-06-13 MED ORDER — ACETAMINOPHEN 650 MG RE SUPP: 650.0000 mg | Freq: Four times a day (QID) | RECTAL | Status: DC | PRN

## 2018-06-13 MED ORDER — HALOPERIDOL LACTATE 5 MG/ML IJ SOLN
5.0000 mg | Freq: Once | INTRAMUSCULAR | Status: AC
  Administered 2018-06-13: 5 mg via INTRAVENOUS
  Filled 2018-06-13: qty 1

## 2018-06-13 MED ORDER — SODIUM CHLORIDE 0.9 % IV SOLN: 250.0000 mL | INTRAVENOUS | Status: DC | PRN

## 2018-06-13 MED ORDER — SODIUM CHLORIDE 0.9 % IV SOLN
INTRAVENOUS | Status: DC
  Administered 2018-06-14 (×2): via INTRAVENOUS

## 2018-06-13 MED ORDER — HYDROCODONE-ACETAMINOPHEN 5-325 MG PO TABS
1.0000 | ORAL_TABLET | ORAL | Status: DC | PRN
  Administered 2018-06-13 – 2018-06-14 (×4): 1 via ORAL
  Filled 2018-06-13 (×4): qty 1

## 2018-06-13 MED ORDER — PANCRELIPASE (LIP-PROT-AMYL) 12000-38000 UNITS PO CPEP
24000.0000 [IU] | ORAL_CAPSULE | Freq: Three times a day (TID) | ORAL | Status: DC
  Administered 2018-06-15 – 2018-06-16 (×5): 24000 [IU] via ORAL
  Filled 2018-06-13 (×12): qty 2

## 2018-06-13 MED ORDER — ONDANSETRON HCL 4 MG/2ML IJ SOLN
4.0000 mg | Freq: Four times a day (QID) | INTRAMUSCULAR | Status: DC | PRN
  Administered 2018-06-14 – 2018-06-17 (×3): 4 mg via INTRAVENOUS
  Filled 2018-06-13 (×3): qty 2

## 2018-06-13 MED ORDER — ACETAMINOPHEN 325 MG PO TABS: 650.0000 mg | ORAL_TABLET | Freq: Four times a day (QID) | ORAL | Status: DC | PRN

## 2018-06-13 MED ORDER — ALBUTEROL SULFATE (2.5 MG/3ML) 0.083% IN NEBU: 2.5000 mg | INHALATION_SOLUTION | Freq: Four times a day (QID) | RESPIRATORY_TRACT | Status: DC

## 2018-06-13 MED ORDER — SODIUM CHLORIDE 0.9 % IV BOLUS
1000.0000 mL | Freq: Once | INTRAVENOUS | Status: AC
  Administered 2018-06-13: 1000 mL via INTRAVENOUS

## 2018-06-13 MED ORDER — PANTOPRAZOLE SODIUM 40 MG PO TBEC
40.0000 mg | DELAYED_RELEASE_TABLET | Freq: Every day | ORAL | Status: DC
  Administered 2018-06-15 – 2018-06-17 (×3): 40 mg via ORAL
  Filled 2018-06-13 (×4): qty 1

## 2018-06-13 MED ORDER — ONDANSETRON HCL 4 MG PO TABS: 4.0000 mg | ORAL_TABLET | Freq: Four times a day (QID) | ORAL | Status: DC | PRN

## 2018-06-13 MED ORDER — POLYETHYLENE GLYCOL 3350 17 G PO PACK: 17.0000 g | PACK | Freq: Every day | ORAL | Status: DC | PRN

## 2018-06-13 MED ORDER — SODIUM CHLORIDE 0.9 % IV SOLN
10.0000 mL/h | Freq: Once | INTRAVENOUS | Status: AC
  Administered 2018-06-13: 10 mL/h via INTRAVENOUS

## 2018-06-13 MED ORDER — ALBUTEROL SULFATE (2.5 MG/3ML) 0.083% IN NEBU: 2.5000 mg | INHALATION_SOLUTION | Freq: Four times a day (QID) | RESPIRATORY_TRACT | Status: DC | PRN

## 2018-06-13 MED ORDER — SODIUM CHLORIDE 0.9% FLUSH
3.0000 mL | Freq: Two times a day (BID) | INTRAVENOUS | Status: DC
  Administered 2018-06-14 – 2018-06-17 (×5): 3 mL via INTRAVENOUS

## 2018-06-13 MED ORDER — PROTHROMBIN COMPLEX CONC HUMAN 500 UNITS IV KIT
3151.0000 [IU] | PACK | Status: AC
  Administered 2018-06-13: 3151 [IU] via INTRAVENOUS
  Filled 2018-06-13: qty 3151

## 2018-06-13 NOTE — Telephone Encounter (Signed)
Pt notified of results.   Dr. Allen Norris: Pt stated she is still quite a lot of pain. She would like to know what to do about it. Please advise.

## 2018-06-13 NOTE — ED Notes (Signed)
Repeat pink top drawn and sent

## 2018-06-13 NOTE — ED Triage Notes (Signed)
Pt here for abdominal pain.  Mostly pain to mid upper abdomen and pelvic area.  Hx of blood clots to liver and spleen and pancreas.  Hx of pancreatitis. Hx uterine fibroids. On xarelto.  Vaginal bleeding X 1 week after already had period. Put diaper on 1 hr ago and it is full.  Membranes extremely pale. Soft bp in triage, normally hypertensive.

## 2018-06-13 NOTE — ED Notes (Signed)
Blood transfusion started. VSS. Will monito first 15 mins, before calling report to unit.

## 2018-06-13 NOTE — Telephone Encounter (Signed)
It appears that she has chronic pancreatitis. Make sure she is using the enzymes and tell her to go on a clear liquid diet to rest the pancrease. If she continues to have pain she may need to go to [facility] for [facility] guy for a possible ERCP of the pancreatic duct.

## 2018-06-13 NOTE — Telephone Encounter (Signed)
-----   Message from Lucilla Lame, MD sent at 06/10/2018 11:45 AM EDT ----- Let the patient know that she will need to have a vaccination for both hepatitis A and hepatitis B because she is not immune to them.  Also let her know that the MRI showed a cystic lesion in the pancreas and chronic pancreatitis with continued thrombus in the portal vein.  She should follow-up with me in 1 month.

## 2018-06-13 NOTE — ED Provider Notes (Signed)
[facility] Emergency Department Provider Note  ____________________________________________   I have reviewed the triage vital signs and the nursing notes.   HISTORY  Chief Complaint Abdominal pain  History limited by: Not Limited   HPI Mairi Figler is a 43 y.o. female who presents to the emergency department today because of concern for abdominal pain. The patient states that she has been having the pain for a number of months. Had a hospitalization in [place] where they found multiple intraabdominal issues. Amongst these she states are chronic pancreatitis, intrahepatic blood clots, fibroids. Has more recently followed up with vascular surgery and GI in the area. Was started on blood thinner. Continues to have significant abdominal pain both in the upper and lower abdomin. The patient also has complaints of heavy vaginal bleeding and weakness. States that the bleeding has been heavy for roughly 2 weeks. She states that she has had clots pass. Has had weakness and shortness of breath with the bleeding. States she has required blood transfusions in the past.   Records reviewed. Per medical record review patient has a history of pancreatitis, on blood thinners for portal vein thrombosis.   Past Medical History:  Diagnosis Date  . Coronary artery disease   . H/O blood clots   . Hypertension   . Liver abscess   . Pancreatitis   . Thyroid disease   . Uterine fibroid     Patient Active Problem List   Diagnosis Date Noted  . Swelling of limb 05/17/2018  . Essential hypertension 05/17/2018  . Pancreatitis, chronic ([place]) 05/17/2018  . Portal vein thrombosis 05/17/2018    Past Surgical History:  Procedure Laterality Date  . CESAREAN SECTION    . THYROIDECTOMY, PARTIAL      Prior to Admission medications   Medication Sig Start Date End Date Taking? Authorizing Provider  amLODipine (NORVASC) 5 MG tablet Take 5 mg by mouth daily. 05/17/18   [provider]  oxyCODONE (ROXICODONE) 5 MG immediate release tablet Take 1 tablet (5 mg total) by mouth every 8 (eight) hours as needed. Patient not taking: Reported on 05/28/2018 05/15/18 05/15/19  Harvest Dark, MD  Pancrelipase, Lip-Prot-Amyl, (PANCREAZE) 78588 units CPEP Take by mouth 3 (three) times daily before meals.     [provider]  pantoprazole (PROTONIX) 40 MG tablet Take 40 mg by mouth daily.    [provider]  XARELTO 15 MG TABS tablet TAKE 1 TABLET BY MOUTH TWICE DAILY FOR 3 WEEKS 05/17/18   [provider]    Allergies Patient has no known allergies.  Family History  Problem Relation Age of Onset  . Hypertension Mother   . Diabetes Mother   . Cancer Father   . Cancer Paternal Grandmother   . Cancer Paternal Grandfather     Social History Social History   Tobacco Use  . Smoking status: Current Every Day Smoker    Types: Cigarettes  . Smokeless tobacco: Never Used  Substance Use Topics  . Alcohol use: Not Currently  . Drug use: Yes    Types: Marijuana    Review of Systems Constitutional: No fever/chills Eyes: No visual changes. ENT: No sore throat. Cardiovascular: Denies chest pain. Respiratory: Positive for shortness of breath. Gastrointestinal: Positive for abdominal pain.  Genitourinary: Positive for vaginal bleeding.  Musculoskeletal: Negative for back pain. Skin: Negative for rash. Neurological: Negative for headaches, focal weakness or numbness.  ____________________________________________   PHYSICAL EXAM:  VITAL SIGNS: ED Triage Vitals  Enc Vitals Group  BP 06/13/18 1529 (!) 98/58     Pulse Rate 06/13/18 1529 (!) 104     Resp --      Temp 06/13/18 1529 98.7 F (37.1 C)     Temp Source 06/13/18 1529 Oral     SpO2 06/13/18 1529 100 %     Weight 06/13/18 1529 147 lb (66.7 kg)     Height 06/13/18 1529 5\' 7"  (1.702 m)     Head Circumference --      Peak Flow --      Pain Score 06/13/18 1535 10    Constitutional: Alert and oriented.  Eyes: Conjunctivae are normal.  ENT      Head: Normocephalic and atraumatic.      Nose: No congestion/rhinnorhea.      Mouth/Throat: Mucous membranes are moist.      Neck: No stridor. Hematological/Lymphatic/Immunilogical: No cervical lymphadenopathy. Cardiovascular: Tachycardic, regular rhythm.  No murmurs, rubs, or gallops.  Respiratory: Normal respiratory effort without tachypnea nor retractions. Breath sounds are clear and equal bilaterally. No wheezes/rales/rhonchi. Gastrointestinal: Soft and diffusely tender to palpation. No rebound. No guarding.  Genitourinary: Deferred Musculoskeletal: Normal range of motion in all extremities. No lower extremity edema. Neurologic:  Normal speech and language. No gross focal neurologic deficits are appreciated.  Skin:  Skin is warm, dry and intact. No rash noted. Psychiatric: Mood and affect are normal. Speech and behavior are normal. Patient exhibits appropriate insight and judgment.  ____________________________________________    LABS (pertinent positives/negatives)  CMP na 134, k 3.7, glu 130, cr 0.48 CBC wbc 5.5, hgb 4.5, plt 334 ____________________________________________   EKG  None  ____________________________________________    RADIOLOGY  None  ____________________________________________   PROCEDURES  Procedures  CRITICAL CARE Performed by: Nance Pear   Total critical care time: 30 minutes  Critical care time was exclusive of separately billable procedures and treating other patients.  Critical care was necessary to treat or prevent imminent or life-threatening deterioration.  Critical care was time spent personally by me on the following activities: development of treatment plan with patient and/or surrogate as well as nursing, discussions with consultants, evaluation of patient's response to treatment, examination of patient, obtaining history from patient or  surrogate, ordering and performing treatments and interventions, ordering and review of laboratory studies, ordering and review of radiographic studies, pulse oximetry and re-evaluation of patient's condition.  ____________________________________________   INITIAL IMPRESSION / ASSESSMENT AND PLAN / ED COURSE  Pertinent labs & imaging results that were available during my care of the patient were reviewed by me and considered in my medical decision making (see chart for details).   Patient presented to the emergency department today because of concerns for weakness and vaginal bleeding.  Patient also complained of abdominal pain.  Patient was noted to be slightly tachycardic and somewhat hypotensive upon initial arrival.  Patient is on blood thinners and given history of vaginal bleeding do have concern for significant anemia.  CBC did show hemoglobin of 4.5.  Given continued bleeding patient was ordered [employer].  Additionally patient was ordered and consented for donor blood.  Will plan on admission.  Discussed with Dr. Lucky Cowboy who will evaluate the patient tomorrow for possible intervention.    ____________________________________________   FINAL CLINICAL IMPRESSION(S) / ED DIAGNOSES  Final diagnoses:  Vaginal bleeding  Abdominal pain, unspecified abdominal location  Anemia, unspecified type     Note: This dictation was prepared with Dragon dictation. Any transcriptional errors that result from this process are unintentional  Nance Pear, MD 06/13/18 2051

## 2018-06-13 NOTE — ED Notes (Signed)
tolorating Blood transfusion, vss.

## 2018-06-13 NOTE — Progress Notes (Signed)
Family Meeting Note  Advance Directive:yes  Today a meeting took place with the Patient.  Patient is able to participate   The following clinical team members were present during this meeting:MD  The following were discussed:Patient's diagnosis:42 y.o. female with a known history per below which includes portal vein thrombosis diagnosed roughly 1 month ago- on Xarelto, known history of uterine fibroids, presenting with recurrent abdominal pain, generalized  weakness, fatigue, lightheadedness, dark multiple episodes of vaginal bleeding with clots, has had to change pads numerous times each day over the last 1 to 2 weeks, in the emergency room patient was tachycardic, hypotensive, hemoglobin 4.5 down from 8.9, recently seen by Dr. dew/vascular surgery, ED attending did discuss with vascular surgery-plans for bilateral uterine artery embolization on tomorrow, patient given Kcentra in the emergency room, patient evaluated in the emergency room, no apparent distress, patient is now been admitted for acute blood loss anemia due to uterine fibroid disease which is compounded by Xarelto, Patient's progosis: Unable to determine and Goals for treatment: Full Code   Additional follow-up to be provided: prn  Time spent during discussion:20 minutes  Gorden Harms, MD

## 2018-06-13 NOTE — H&P (Signed)
[facility] [place] NAME: Amy Wolfe    MR#:  [id number]  DATE OF BIRTH:  1975/01/11  DATE OF ADMISSION:  06/13/2018  PRIMARY CARE PHYSICIAN: Patient, [name]   REQUESTING/REFERRING PHYSICIAN:   CHIEF COMPLAINT:   Chief Complaint  Patient presents with  . Vaginal Bleeding  . Abdominal Pain    HISTORY OF PRESENT ILLNESS: Amy Wolfe  is a 43 y.o. female with a known history per below which includes portal vein thrombosis diagnosed roughly 1 month ago- on Xarelto, known history of uterine fibroids, presenting with recurrent abdominal pain, generalized  weakness, fatigue, lightheadedness, dark multiple episodes of vaginal bleeding with clots, has had to change pads numerous times each day over the last 1 to 2 weeks, in the emergency room patient was tachycardic, hypotensive, hemoglobin 4.5 down from 8.9, recently seen by Dr. dew/vascular surgery, ED attending did discuss with vascular surgery-plans for bilateral uterine artery embolization on tomorrow, patient given Kcentra in the emergency room, patient evaluated in the emergency room, no apparent distress, patient is now been admitted for acute blood loss anemia due to uterine fibroid disease which is compounded by Xarelto.  PAST MEDICAL HISTORY:   Past Medical History:  Diagnosis Date  . Coronary artery disease   . H/O blood clots   . Hypertension   . Liver abscess   . Pancreatitis   . Thyroid disease   . Uterine fibroid     PAST SURGICAL HISTORY:  Past Surgical History:  Procedure Laterality Date  . CESAREAN SECTION    . THYROIDECTOMY, PARTIAL      SOCIAL HISTORY:  Social History   Tobacco Use  . Smoking status: Current Every Day Smoker    Types: Cigarettes  . Smokeless tobacco: Never Used  Substance Use Topics  . Alcohol use: Not Currently    FAMILY HISTORY:  Family History  Problem Relation Age of Onset  . Hypertension Mother   . Diabetes Mother   . Cancer  Father   . Cancer Paternal Grandmother   . Cancer Paternal Grandfather     DRUG ALLERGIES: No Known Allergies  REVIEW OF SYSTEMS:   CONSTITUTIONAL: No fever, fatigue or weakness.  EYES: No blurred or double vision.  EARS, NOSE, AND THROAT: No tinnitus or ear pain.  RESPIRATORY: No cough, shortness of breath, wheezing or hemoptysis.  CARDIOVASCULAR: No chest pain, orthopnea, edema.  GASTROINTESTINAL: No nausea, vomiting, diarrhea + abdominal pain.  GENITOURINARY: No dysuria, hematuria.  ENDOCRINE: No polyuria, nocturia,  HEMATOLOGY: No anemia, easy bruising or bleeding SKIN: No rash or lesion. MUSCULOSKELETAL: No joint pain or arthritis.   NEUROLOGIC: No tingling, numbness, weakness.  PSYCHIATRY: No anxiety or depression.   MEDICATIONS AT HOME:  Prior to Admission medications   Medication Sig Start Date End Date Taking? Authorizing Provider  amLODipine (NORVASC) 5 MG tablet Take 5 mg by mouth daily. 05/17/18   [provider]  Pancrelipase, Lip-Prot-Amyl, (PANCREAZE) 82505 units CPEP Take by mouth 3 (three) times daily before meals.     [provider]  pantoprazole (PROTONIX) 40 MG tablet Take 40 mg by mouth daily.    [provider]  XARELTO 15 MG TABS tablet TAKE 1 TABLET BY MOUTH TWICE DAILY FOR 3 WEEKS 05/17/18   [provider]      PHYSICAL EXAMINATION:   VITAL SIGNS: Blood pressure 112/60, pulse 97, temperature 98.7 F (37.1 C), temperature source Oral, resp. rate 17, height 5\' 7"  (1.702 m),  weight 66.4 kg, last menstrual period 05/28/2018, SpO2 100 %.  GENERAL:  43 y.o.-year-old patient lying in the bed with no acute distress.  EYES: Pupils equal, round, reactive to light and accommodation. No scleral icterus. Extraocular muscles intact.  HEENT: Head atraumatic, normocephalic. Oropharynx and nasopharynx clear.  NECK:  Supple, no jugular venous distention. No thyroid enlargement, no tenderness.  LUNGS: Normal breath sounds  bilaterally, no wheezing, rales,rhonchi or crepitation. No use of accessory muscles of respiration.  CARDIOVASCULAR: S1, S2 normal. No murmurs, rubs, or gallops.  ABDOMEN: Soft, nontender, nondistended. Bowel sounds present. No organomegaly or mass.  EXTREMITIES: No pedal edema, cyanosis, or clubbing.  NEUROLOGIC: Cranial nerves II through XII are intact. Muscle strength 5/5 in all extremities. Sensation intact. Gait not checked.  PSYCHIATRIC: The patient is alert and oriented x 3.  SKIN: No obvious rash, lesion, or ulcer.   LABORATORY PANEL:   CBC Recent Labs  Lab 06/13/18 1546  WBC 5.5  HGB 4.5*  HCT 15.7*  PLT 334  MCV 70.4*  MCH 20.2*  MCHC 28.7*  RDW 22.1*   ------------------------------------------------------------------------------------------------------------------  Chemistries  Recent Labs  Lab 06/13/18 1546  NA 134*  K 3.7  CL 104  CO2 24  GLUCOSE 130*  BUN 10  CREATININE 0.48  CALCIUM 8.6*  AST 14*  ALT 10  ALKPHOS 96  BILITOT 0.2*   ------------------------------------------------------------------------------------------------------------------ estimated creatinine clearance is 90.9 mL/min (by C-G formula based on SCr of 0.48 mg/dL). ------------------------------------------------------------------------------------------------------------------ No results for input(s): TSH, T4TOTAL, T3FREE, THYROIDAB in the last 72 hours.  Invalid input(s): FREET3   Coagulation profile No results for input(s): INR, PROTIME in the last 168 hours. ------------------------------------------------------------------------------------------------------------------- No results for input(s): DDIMER in the last 72 hours. -------------------------------------------------------------------------------------------------------------------  Cardiac Enzymes No results for input(s): CKMB, TROPONINI, MYOGLOBIN in the last 168 hours.  Invalid input(s):  CK ------------------------------------------------------------------------------------------------------------------ Invalid input(s): POCBNP  ---------------------------------------------------------------------------------------------------------------  Urinalysis    Component Value Date/Time   COLORURINE YELLOW (A) 05/15/2018 1040   APPEARANCEUR CLEAR (A) 05/15/2018 1040   LABSPEC 1.006 05/15/2018 1040   PHURINE 8.0 05/15/2018 1040   GLUCOSEU NEGATIVE 05/15/2018 1040   HGBUR NEGATIVE 05/15/2018 1040   BILIRUBINUR NEGATIVE 05/15/2018 1040   [place] 05/15/2018 1040   PROTEINUR NEGATIVE 05/15/2018 1040   NITRITE NEGATIVE 05/15/2018 1040   LEUKOCYTESUR NEGATIVE 05/15/2018 1040     RADIOLOGY: No results found.  EKG: Orders placed or performed during the hospital encounter of 06/13/18  . ED EKG  . ED EKG    IMPRESSION AND PLAN: *Acute blood loss anemia due to uterine fibroids Compounded by Xarelto use Status post Kcentra in the emergency room Admit to regular nursing for bed, transfused 3 units packed red blood cells, H&H every 8 hours, CBC daily, transfuse as needed, vascular surgery/Dr. dew consulted-plans for bilateral uterine artery embolization on tomorrow, n.p.o. after midnight, IV fluids for rehydration, anemia work-up for completeness-treat as indicated, and continue close medical monitoring  *History of portal vein thrombosis Thought to be due to chronic pancreatitis Recently diagnosed 1 month ago-started on Xarelto Reversed with Kcentra, continue to hold Xarelto for now  *Chronic pancreatitis Stable Continue Creon with meals, adult pain protocol  *Chronic hypertension Currently hypotensive due to blood loss, hold Norvasc  DVT prophylaxis with SCDs GI prophylaxis with PPI daily Disposition pending clinical course  All the records are reviewed and case discussed with ED provider. Management plans discussed with the patient, family and they are  in agreement.  CODE STATUS:full    TOTAL  TIME TAKING CARE OF THIS PATIENT: 40 minutes.    Avel Peace Squire Withey M.D on 06/13/2018   Between 7am to 6pm - Pager - [phone]  After 6pm go to [email or web address] - password [email or web address] [facility]  Office  [phone]  CC: Primary care physician; Patient, [name]   Note: This dictation was prepared with Dragon dictation along with smaller phrase technology. Any transcriptional errors that result from this process are unintentional.

## 2018-06-14 ENCOUNTER — Other Ambulatory Visit (INDEPENDENT_AMBULATORY_CARE_PROVIDER_SITE_OTHER): Payer: Self-pay | Admitting: Vascular Surgery

## 2018-06-14 ENCOUNTER — Encounter
Admission: EM | Disposition: A | Discharge status: Home / Self Care | Payer: Self-pay | Source: Home / Self Care | Attending: Internal Medicine

## 2018-06-14 ENCOUNTER — Encounter: Payer: Self-pay | Admitting: Obstetrics and Gynecology

## 2018-06-14 DIAGNOSIS — D259 Leiomyoma of uterus, unspecified: Secondary | ICD-10-CM

## 2018-06-14 DIAGNOSIS — N938 Other specified abnormal uterine and vaginal bleeding: Secondary | ICD-10-CM

## 2018-06-14 DIAGNOSIS — D62 Acute posthemorrhagic anemia: Secondary | ICD-10-CM

## 2018-06-14 DIAGNOSIS — D649 Anemia, unspecified: Secondary | ICD-10-CM

## 2018-06-14 DIAGNOSIS — I81 Portal vein thrombosis: Secondary | ICD-10-CM

## 2018-06-14 DIAGNOSIS — K861 Other chronic pancreatitis: Secondary | ICD-10-CM

## 2018-06-14 DIAGNOSIS — Z9289 Personal history of other medical treatment: Secondary | ICD-10-CM

## 2018-06-14 DIAGNOSIS — Z7901 Long term (current) use of anticoagulants: Secondary | ICD-10-CM

## 2018-06-14 DIAGNOSIS — I10 Essential (primary) hypertension: Secondary | ICD-10-CM

## 2018-06-14 DIAGNOSIS — N939 Abnormal uterine and vaginal bleeding, unspecified: Secondary | ICD-10-CM

## 2018-06-14 DIAGNOSIS — I2510 Atherosclerotic heart disease of native coronary artery without angina pectoris: Secondary | ICD-10-CM

## 2018-06-14 HISTORY — PX: EMBOLIZATION: CATH118239

## 2018-06-14 HISTORY — PX: EMBOLIZATION (CATH LAB): CATH118239

## 2018-06-14 LAB — HEMOGLOBIN AND HEMATOCRIT, BLOOD
HCT: 22.9 % — ABNORMAL LOW (ref 36.0–46.0)
HCT: 22.3 % — ABNORMAL LOW (ref 36.0–46.0)
Hemoglobin: 7.1 g/dL — ABNORMAL LOW (ref 12.0–15.0)
Hemoglobin: 6.9 g/dL — ABNORMAL LOW (ref 12.0–15.0)

## 2018-06-14 LAB — RETICULOCYTES
Immature Retic Fract: 8 % (ref 2.3–15.9)
RBC.: 2.82 MIL/uL — ABNORMAL LOW (ref 3.87–5.11)
Retic Count, Absolute: 19.7 10*3/uL (ref 19.0–186.0)
Retic Ct Pct: 0.7 % (ref 0.4–3.1)

## 2018-06-14 LAB — IRON AND TIBC
Iron: 292 ug/dL — ABNORMAL HIGH (ref 28–170)
Saturation Ratios: 85 % — ABNORMAL HIGH (ref 10.4–31.8)
TIBC: 342 ug/dL (ref 250–450)
UIBC: 50 ug/dL

## 2018-06-14 LAB — BASIC METABOLIC PANEL
Anion gap: 4 — ABNORMAL LOW (ref 5–15)
BUN: 8 mg/dL (ref 6–20)
CO2: 23 mmol/L (ref 22–32)
Calcium: 7.6 mg/dL — ABNORMAL LOW (ref 8.9–10.3)
Chloride: 109 mmol/L (ref 98–111)
Creatinine, Ser: 0.43 mg/dL — ABNORMAL LOW (ref 0.44–1.00)
GFR calc Af Amer: >60 mL/min (ref >60)
GFR calc non Af Amer: >60 mL/min (ref >60)
Glucose, Bld: 96 mg/dL (ref 70–99)
Potassium: 3.2 mmol/L — ABNORMAL LOW (ref 3.5–5.1)
Sodium: 136 mmol/L (ref 135–145)

## 2018-06-14 LAB — FOLATE: Folate: 9.5 ng/mL (ref >5.9)

## 2018-06-14 LAB — FERRITIN: Ferritin: 4 ng/mL — ABNORMAL LOW (ref 11–307)

## 2018-06-14 LAB — VITAMIN B12: Vitamin B-12: 223 pg/mL (ref 180–914)

## 2018-06-14 LAB — TSH: TSH: 1.967 u[IU]/mL (ref 0.350–4.500)

## 2018-06-14 SURGERY — EMBOLIZATION
Anesthesia: Moderate Sedation

## 2018-06-14 MED ORDER — HYDROMORPHONE HCL 1 MG/ML IJ SOLN
1.0000 mg | Freq: Once | INTRAMUSCULAR | Status: DC | PRN
  Administered 2018-06-14: 1 mg via INTRAVENOUS

## 2018-06-14 MED ORDER — ONDANSETRON HCL 4 MG/2ML IJ SOLN
4.0000 mg | Freq: Four times a day (QID) | INTRAMUSCULAR | Status: DC | PRN
  Administered 2018-06-14: 4 mg via INTRAVENOUS

## 2018-06-14 MED ORDER — CEFAZOLIN SODIUM-DEXTROSE 2-4 GM/100ML-% IV SOLN: 2.0000 g | Freq: Once | INTRAVENOUS | Status: DC

## 2018-06-14 MED ORDER — HYDROMORPHONE HCL 1 MG/ML IJ SOLN
INTRAMUSCULAR | Status: AC
  Filled 2018-06-14: qty 1

## 2018-06-14 MED ORDER — ONDANSETRON HCL 4 MG/2ML IJ SOLN: 4.0000 mg | Freq: Four times a day (QID) | INTRAMUSCULAR | Status: DC | PRN

## 2018-06-14 MED ORDER — FENTANYL CITRATE (PF) 100 MCG/2ML IJ SOLN
INTRAMUSCULAR | Status: AC
  Filled 2018-06-14: qty 2

## 2018-06-14 MED ORDER — MORPHINE SULFATE 2 MG/ML IV SOLN: INTRAVENOUS | Status: DC

## 2018-06-14 MED ORDER — NALOXONE HCL 0.4 MG/ML IJ SOLN: 0.4000 mg | INTRAMUSCULAR | Status: DC | PRN

## 2018-06-14 MED ORDER — MIDAZOLAM HCL 2 MG/2ML IJ SOLN
INTRAMUSCULAR | Status: DC | PRN
  Administered 2018-06-14 (×2): 1 mg via INTRAVENOUS
  Administered 2018-06-14: 2 mg via INTRAVENOUS
  Administered 2018-06-14: 1 mg via INTRAVENOUS

## 2018-06-14 MED ORDER — MORPHINE SULFATE 2 MG/ML IV SOLN
INTRAVENOUS | Status: DC
  Administered 2018-06-14: 20:00:00 via INTRAVENOUS
  Administered 2018-06-15: 17.9 mg via INTRAVENOUS
  Administered 2018-06-15: 11.96 mg via INTRAVENOUS
  Administered 2018-06-15: 8.87 mg via INTRAVENOUS
  Administered 2018-06-15: 14.94 mg via INTRAVENOUS
  Administered 2018-06-15: 9.58 mg via INTRAVENOUS
  Administered 2018-06-15: 1 mg via INTRAVENOUS
  Administered 2018-06-16: 8.85 mg via INTRAVENOUS
  Administered 2018-06-16: 1 mg via INTRAVENOUS
  Administered 2018-06-16: 10.9 mg via INTRAVENOUS
  Filled 2018-06-14: qty 30
  Filled 2018-06-14: qty 25
  Filled 2018-06-14 (×2): qty 30

## 2018-06-14 MED ORDER — HYDROMORPHONE HCL 1 MG/ML IJ SOLN: 1.0000 mg | INTRAMUSCULAR | Status: DC

## 2018-06-14 MED ORDER — HYDROMORPHONE HCL 1 MG/ML IJ SOLN
INTRAMUSCULAR | Status: AC
  Administered 2018-06-14: 1 mg via INTRAVENOUS
  Filled 2018-06-14: qty 1

## 2018-06-14 MED ORDER — HEPARIN SODIUM (PORCINE) 1000 UNIT/ML IJ SOLN
INTRAMUSCULAR | Status: AC
  Filled 2018-06-14: qty 1

## 2018-06-14 MED ORDER — MIDAZOLAM HCL 2 MG/ML PO SYRP: 8.0000 mg | ORAL_SOLUTION | Freq: Once | ORAL | Status: DC | PRN

## 2018-06-14 MED ORDER — MORPHINE SULFATE 2 MG/ML IV SOLN
INTRAVENOUS | Status: DC
  Administered 2018-06-14: 16:00:00 via INTRAVENOUS
  Filled 2018-06-14 (×4): qty 30

## 2018-06-14 MED ORDER — DIPHENHYDRAMINE HCL 50 MG/ML IJ SOLN: 12.5000 mg | Freq: Four times a day (QID) | INTRAMUSCULAR | Status: DC | PRN

## 2018-06-14 MED ORDER — ONDANSETRON HCL 4 MG/2ML IJ SOLN
INTRAMUSCULAR | Status: AC
  Filled 2018-06-14: qty 2

## 2018-06-14 MED ORDER — DIPHENHYDRAMINE HCL 50 MG/ML IJ SOLN: 50.0000 mg | Freq: Once | INTRAMUSCULAR | Status: DC | PRN

## 2018-06-14 MED ORDER — IODIXANOL 320 MG/ML IV SOLN
INTRAVENOUS | Status: DC | PRN
  Administered 2018-06-14: 15:00:00 70 mL via INTRA_ARTERIAL

## 2018-06-14 MED ORDER — METHYLPREDNISOLONE SODIUM SUCC 125 MG IJ SOLR: 125.0000 mg | Freq: Once | INTRAMUSCULAR | Status: DC | PRN

## 2018-06-14 MED ORDER — FENTANYL CITRATE (PF) 100 MCG/2ML IJ SOLN
INTRAMUSCULAR | Status: DC | PRN
  Administered 2018-06-14: 50 ug via INTRAVENOUS
  Administered 2018-06-14 (×2): 25 ug via INTRAVENOUS
  Administered 2018-06-14: 50 ug via INTRAVENOUS

## 2018-06-14 MED ORDER — LIDOCAINE-EPINEPHRINE (PF) 1 %-1:200000 IJ SOLN
INTRAMUSCULAR | Status: AC
  Filled 2018-06-14: qty 30

## 2018-06-14 MED ORDER — KETOROLAC TROMETHAMINE 30 MG/ML IJ SOLN
30.0000 mg | Freq: Four times a day (QID) | INTRAMUSCULAR | Status: DC
  Administered 2018-06-14 – 2018-06-16 (×8): 30 mg via INTRAVENOUS
  Filled 2018-06-14 (×8): qty 1

## 2018-06-14 MED ORDER — SODIUM CHLORIDE 0.9 % IV SOLN: INTRAVENOUS | Status: DC

## 2018-06-14 MED ORDER — CEFAZOLIN SODIUM-DEXTROSE 2-4 GM/100ML-% IV SOLN
INTRAVENOUS | Status: AC
  Administered 2018-06-14: 2 g
  Filled 2018-06-14: qty 100

## 2018-06-14 MED ORDER — DIPHENHYDRAMINE HCL 12.5 MG/5ML PO ELIX
12.5000 mg | ORAL_SOLUTION | Freq: Four times a day (QID) | ORAL | Status: DC | PRN
  Filled 2018-06-14: qty 5

## 2018-06-14 MED ORDER — FAMOTIDINE 20 MG PO TABS: 40.0000 mg | ORAL_TABLET | Freq: Once | ORAL | Status: DC | PRN

## 2018-06-14 MED ORDER — SODIUM CHLORIDE 0.9% FLUSH: 9.0000 mL | INTRAVENOUS | Status: DC | PRN

## 2018-06-14 MED ORDER — MIDAZOLAM HCL 5 MG/5ML IJ SOLN
INTRAMUSCULAR | Status: AC
  Filled 2018-06-14: qty 5

## 2018-06-14 MED ORDER — SODIUM CHLORIDE 0.9 % IV SOLN
INTRAVENOUS | Status: DC
  Administered 2018-06-14 – 2018-06-16 (×3): via INTRAVENOUS

## 2018-06-14 SURGICAL SUPPLY — 20 items
BLOCK BEAD 500-700 (Vascular Products) ×3 IMPLANT
BLOCK BEAD 700-900 (Vascular Products) ×3 IMPLANT
CATH BEACON 5 .035 65 KMP TIP (CATHETERS) ×3 IMPLANT
CATH BEACON 5 .035 65 RIM TIP (CATHETERS) ×3 IMPLANT
CATH MICROCATH PRGRT 2.8F 110 (CATHETERS) ×1 IMPLANT
CATH PIG 70CM (CATHETERS) ×3 IMPLANT
COIL 400 COMPLEX SOFT 4X15CM (Vascular Products) ×6 IMPLANT
COIL 400 COMPLEX STD 4X10CM (Vascular Products) ×3 IMPLANT
COIL 400 COMPLEX STD 4X15CM (Vascular Products) ×3 IMPLANT
DEVICE STARCLOSE SE CLOSURE (Vascular Products) ×3 IMPLANT
DEVICE TORQUE .025-.038 (MISCELLANEOUS) ×3 IMPLANT
GLIDEWIRE STIFF .35X180X3 HYDR (WIRE) ×3 IMPLANT
GUIDEWIRE ANGLED .035 180CM (WIRE) ×3 IMPLANT
HANDLE DETACHMENT COIL (MISCELLANEOUS) ×3 IMPLANT
MICROCATH PROGREAT 2.8F 110 CM (CATHETERS) ×3
PACK ANGIOGRAPHY (CUSTOM PROCEDURE TRAY) ×3 IMPLANT
SHEATH BRITE TIP 5FRX11 (SHEATH) ×3 IMPLANT
SYR MEDRAD MARK 7 150ML (SYRINGE) ×3 IMPLANT
TUBING CONTRAST HIGH PRESS 72 (TUBING) ×3 IMPLANT
WIRE J 3MM .035X145CM (WIRE) ×3 IMPLANT

## 2018-06-14 NOTE — Consult Note (Signed)
Reason for Consult:Uterine fibroids, symptomatic anemia Referring Physician: Dr. Epifanio Lesches  Amy Wolfe is an 43 y.o. [phone] female who presented to the Emergency Room yesterday evening due to complaints of abdominal pain (upper and lower) for the past several months.  Of note, patient has a history of chronic pancreatitis, portal vein thrombosis, and fibroid uterus.  She was previously seen in [place] and diagnosed with the pancreatitis and blood clots several months ago.  She notes that she has known about her fibroids for several years but was unable to f/u with a Gynecologist due to lack of insurance. She was started on a blood thinner ~ 1-2 months ago.  She has complaints of heavy vaginal bleeding with passage of clots for ~ 2 weeks, along with weakness and shortness of breath.  She does note a history of requiring blood transfusions in the past. Patient was noted to have a Hgb of 4.5 on admission (down from 8.9).  She had recently been seen by Dr. Lucky Cowboy (vascular surgeon), who had plans for uterine artery embolization today, which patient has recently undergone. She has also received a blood transfusion of 3 units on admission.   Pertinent Gynecological History: Bleeding: dysfunctional uterine bleeding, notes changing pads every hour for the past 2 weeks.  Contraception: none Blood transfusions: prior history of blood transfusions in the past Sexually transmitted diseases: no past history per patient Previous GYN Procedures: None  Last pap: patient cannot recall    Menstrual History: Patient's last menstrual period was 05/28/2018.     OB History  Gravida Para Term Preterm AB Living  4 2 2   2 2   SAB TAB Ectopic Multiple Live Births  1 1     2     # Outcome Date GA Lbr Len/2nd Weight Sex Delivery Anes PTL Lv  4 TAB           3 SAB           2 Term           1 Term              Past Medical History:  Diagnosis Date  . Coronary artery disease   . H/O blood clots   .  Hypertension   . Liver abscess   . Pancreatitis   . Thyroid disease   . Uterine fibroid     Past Surgical History:  Procedure Laterality Date  . CESAREAN SECTION    . THYROIDECTOMY, PARTIAL      Family History  Problem Relation Age of Onset  . Hypertension Mother   . Diabetes Mother   . Cancer Father   . Cancer Paternal Grandmother   . Cancer Paternal Grandfather     Social History:  reports that she has been smoking cigarettes. She has never used smokeless tobacco. She reports previous alcohol use. She reports current drug use. Drug: Marijuana.  Allergies: No Known Allergies  Medications:  Prior to Admission:  Medications Prior to Admission  Medication Sig Dispense Refill Last Dose  . Pancrelipase, Lip-Prot-Amyl, (PANCREAZE) 19509 units CPEP Take by mouth 3 (three) times daily before meals.    06/13/2018 at Unknown time  . pantoprazole (PROTONIX) 40 MG tablet Take 40 mg by mouth daily.   06/13/2018 at prn  . XARELTO 15 MG TABS tablet TAKE 1 TABLET BY MOUTH TWICE DAILY FOR 3 WEEKS   06/13/2018 at 0800  . amLODipine (NORVASC) 5 MG tablet Take 5 mg by mouth daily.   Not Taking  at Unknown time    Review of Systems  Constitutional: Positive for malaise/fatigue. Negative for chills, fever and weight loss.  HENT: Negative for congestion, hearing loss, sore throat and tinnitus.   Eyes: Negative for blurred vision, discharge and redness.  Respiratory: Positive for shortness of breath. Negative for cough, sputum production and wheezing.   Cardiovascular: Negative for chest pain, palpitations and leg swelling.  Gastrointestinal: Positive for abdominal pain. Negative for constipation, diarrhea, heartburn, nausea and vomiting.  Genitourinary: Negative for dysuria, flank pain, frequency, hematuria and urgency.  Musculoskeletal: Negative for back pain, joint pain, myalgias and neck pain.  Skin: Negative for itching and rash.  Neurological: Positive for weakness. Negative for dizziness,  seizures, loss of consciousness and headaches.  Endo/Heme/Allergies: Does not bruise/bleed easily.  Psychiatric/Behavioral: Negative for depression, hallucinations and suicidal ideas.    Blood pressure (!) 166/107, pulse 67, temperature 98 F (36.7 C), resp. rate (!) 9, height 5\' 7"  (1.702 m), weight 67.3 kg, last menstrual period 05/28/2018, SpO2 98 %. Physical Exam  Constitutional: She is oriented to person, place, and time. She appears well-developed and well-nourished. She appears distressed.  Patient reports she is in significant pain despite pain medication use  HENT:  Head: Normocephalic and atraumatic.  Eyes: Pupils are equal, round, and reactive to light. EOM are normal. No scleral icterus.  Neck: Normal range of motion. Neck supple. No JVD present.  Cardiovascular: Normal rate, regular rhythm and normal heart sounds.  Respiratory: Effort normal and breath sounds normal. No respiratory distress.  GI: Soft. There is abdominal tenderness. There is guarding.  Unable to perform adequate exam due to patient's pain  Genitourinary:    Genitourinary Comments: Deferred as patient notes significant pain. Declines exam   Musculoskeletal: Normal range of motion.  Neurological: She is alert and oriented to person, place, and time.    Results for orders placed or performed during the hospital encounter of 06/13/18 (from the past 48 hour(s))  Lipase, blood     Status: None   Collection Time: 06/13/18  3:46 PM  Result Value Ref Range   Lipase 49 11 - 51 U/L    Comment: Performed at [facility], [address] [place], [place] [address]  Comprehensive metabolic panel     Status: Abnormal   Collection Time: 06/13/18  3:46 PM  Result Value Ref Range   Sodium 134 (L) 135 - 145 mmol/L   Potassium 3.7 3.5 - 5.1 mmol/L   Chloride 104 98 - 111 mmol/L   CO2 24 22 - 32 mmol/L   Glucose, Bld 130 (H) 70 - 99 mg/dL   BUN 10 6 - 20 mg/dL   Creatinine, Ser 0.48 0.44 - 1.00 mg/dL    Calcium 8.6 (L) 8.9 - 10.3 mg/dL   Total Protein 6.4 (L) 6.5 - 8.1 g/dL   Albumin 3.2 (L) 3.5 - 5.0 g/dL   AST 14 (L) 15 - 41 U/L   ALT 10 0 - 44 U/L   Alkaline Phosphatase 96 38 - 126 U/L   Total Bilirubin 0.2 (L) 0.3 - 1.2 mg/dL   GFR calc non Af Amer >60 >60 mL/min   GFR calc Af Amer >60 >60 mL/min   Anion gap 6 5 - 15    Comment: Performed at [facility], [address] [place], [place] [address]  CBC     Status: Abnormal   Collection Time: 06/13/18  3:46 PM  Result Value Ref Range   WBC 5.5 4.0 - 10.5  K/uL   RBC 2.23 (L) 3.87 - 5.11 MIL/uL   Hemoglobin 4.5 (LL) 12.0 - 15.0 g/dL    Comment: Reticulocyte Hemoglobin testing may be clinically indicated, consider ordering this additional test [id number] THIS CRITICAL RESULT HAS VERIFIED AND BEEN CALLED TO ANNETTE PEREZ BY KASSIE POWELL ON 06 11 2020 AT 1609, AND HAS BEEN READ BACK.  THIS CRITICAL RESULT HAS VERIFIED AND BEEN CALLED TO ANNETTE PEREZ BY KASSIE POWELL ON 06 11 2020 AT 60, AND HAS BEEN READ BACK.     HCT 15.7 (L) 36.0 - 46.0 %   MCV 70.4 (L) 80.0 - 100.0 fL   MCH 20.2 (L) 26.0 - 34.0 pg   MCHC 28.7 (L) 30.0 - 36.0 g/dL   RDW 22.1 (H) 11.5 - 15.5 %   Platelets 334 150 - 400 K/uL   nRBC 0.0 0.0 - 0.2 %    Comment: Performed at [facility], [address] [place], [place] [address]  Type and screen [place]     Status: None   Collection Time: 06/13/18  3:46 PM  Result Value Ref Range   ABO/RH(D) O POS    Antibody Screen NEG    Sample Expiration 06/16/2018,2359    Unit Number [id number]    Blood Component Type RED CELLS,LR    Unit division 00    Status of Unit ISSUED,FINAL    Transfusion Status OK TO TRANSFUSE    Crossmatch Result Compatible    Unit Number [id number]    Blood Component Type RBC, LR IRR    Unit division 00    Status of Unit ISSUED,FINAL    Transfusion Status OK TO TRANSFUSE    Crossmatch Result      Compatible Performed at [facility], [address] [place], [place] [address]   Prepare RBC     Status: None   Collection Time: 06/13/18  4:20 PM  Result Value Ref Range   Order Confirmation      ORDER PROCESSED BY BLOOD BANK Performed at [facility], [address] [place], [place] [address]   SARS Coronavirus 2 (CEPHEID- Performed in [place] hospital lab), Hosp Order     Status: None   Collection Time: 06/13/18  4:33 PM   Specimen: Nasopharyngeal Swab  Result Value Ref Range   SARS Coronavirus 2 NEGATIVE NEGATIVE    Comment: (NOTE) If result is NEGATIVE SARS-CoV-2 target nucleic acids are NOT DETECTED. The SARS-CoV-2 RNA is generally detectable in upper and lower  respiratory specimens during the acute phase of infection. The lowest  concentration of SARS-CoV-2 viral copies this assay can detect is 250  copies / mL. A negative result does not preclude SARS-CoV-2 infection  and should not be used as the sole basis for treatment or other  patient management decisions.  A negative result may occur with  improper specimen collection / handling, submission of specimen other  than nasopharyngeal swab, presence of viral mutation(s) within the  areas targeted by this assay, and inadequate number of viral copies  (<250 copies / mL). A negative result must be combined with clinical  observations, patient history, and epidemiological information. If result is POSITIVE SARS-CoV-2 target nucleic acids are DETECTED. The SARS-CoV-2 RNA is generally detectable in upper and lower  respiratory specimens dur ing the acute phase of infection.  Positive  results are indicative of active infection with SARS-CoV-2.  Clinical  correlation with patient history and other diagnostic information is  necessary to determine  patient infection status.  Positive results do  not rule out bacterial infection or co-infection with other viruses. If result is PRESUMPTIVE POSTIVE SARS-CoV-2 nucleic acids MAY BE  PRESENT.   A presumptive positive result was obtained on the submitted specimen  and confirmed on repeat testing.  While 2019 novel coronavirus  (SARS-CoV-2) nucleic acids may be present in the submitted sample  additional confirmatory testing may be necessary for epidemiological  and / or clinical management purposes  to differentiate between  SARS-CoV-2 and other Sarbecovirus currently known to infect humans.  If clinically indicated additional testing with an alternate test  methodology [phone]) is advised. The SARS-CoV-2 RNA is generally  detectable in upper and lower respiratory sp ecimens during the acute  phase of infection. The expected result is Negative. Fact Sheet for Patients:  StrictlyIdeas.no Fact Sheet for Healthcare Providers: BankingDealers.co.za This test is not yet approved or cleared by the [place] FDA and has been authorized for detection and/or diagnosis of SARS-CoV-2 by FDA under an Emergency Use Authorization (EUA).  This EUA will remain in effect (meaning this test can be used) for the duration of the COVID-19 declaration under Section 564(b)(1) of the Act, 21 U.S.C. section 360bbb-3(b)(1), unless the authorization is terminated or revoked sooner. Performed at [facility], [address] [place], [place] [address]   ABO/Rh     Status: None   Collection Time: 06/13/18  5:54 PM  Result Value Ref Range   ABO/RH(D)      O POS Performed at [facility], [address] [place], [place] [address]   Hemoglobin and hematocrit, blood     Status: Abnormal   Collection Time: 06/14/18  3:20 AM  Result Value Ref Range   Hemoglobin 7.1 (L) 12.0 - 15.0 g/dL    Comment: REPEATED TO VERIFY   HCT 22.9 (L) 36.0 - 46.0 %    Comment: Performed at [facility], [address] [place], [place] [address]  Basic metabolic panel     Status: Abnormal   Collection Time: 06/14/18  3:20 AM   Result Value Ref Range   Sodium 136 135 - 145 mmol/L   Potassium 3.2 (L) 3.5 - 5.1 mmol/L   Chloride 109 98 - 111 mmol/L   CO2 23 22 - 32 mmol/L   Glucose, Bld 96 70 - 99 mg/dL   BUN 8 6 - 20 mg/dL   Creatinine, Ser 0.43 (L) 0.44 - 1.00 mg/dL   Calcium 7.6 (L) 8.9 - 10.3 mg/dL   GFR calc non Af Amer >60 >60 mL/min   GFR calc Af Amer >60 >60 mL/min   Anion gap 4 (L) 5 - 15    Comment: Performed at [facility], [address] [place], [place] [address]  Vitamin B12     Status: None   Collection Time: 06/14/18  3:20 AM  Result Value Ref Range   Vitamin B-12 223 180 - 914 pg/mL    Comment: (NOTE) This assay is not validated for testing neonatal or myeloproliferative syndrome specimens for Vitamin B12 levels. Performed at [facility], [place] [address] [place], [place] [address]   Folate     Status: None   Collection Time: 06/14/18  3:20 AM  Result Value Ref Range   Folate 9.5 >5.9 ng/mL    Comment: Performed at [facility], [address] [place], [place] [address]  Iron and TIBC     Status: Abnormal   Collection Time: 06/14/18  3:20 AM  Result Value Ref Range   Iron 292 (H) 28 - 170 ug/dL   TIBC 342 250 - 450 ug/dL   Saturation Ratios 85 (H) 10.4 - 31.8 %   UIBC 50 ug/dL    Comment: Performed at [facility], [address] [place], [place] [address]  Reticulocytes     Status: Abnormal   Collection Time: 06/14/18  3:20 AM  Result Value Ref Range   Retic Ct Pct 0.7 0.4 - 3.1 %   RBC. 2.82 (L) 3.87 - 5.11 MIL/uL   Retic Count, Absolute 19.7 19.0 - 186.0 K/uL   Immature Retic Fract 8.0 2.3 - 15.9 %    Comment: Performed at [facility], [address] [place], [place] [address]  TSH     Status: None   Collection Time: 06/14/18  3:20 AM  Result Value Ref Range   TSH 1.967 0.350 - 4.500 uIU/mL    Comment: Performed by a 3rd Generation assay with a functional sensitivity of <=0.01 uIU/mL. Performed at [facility],  [address] [place], [place] [address]   Ferritin     Status: Abnormal   Collection Time: 06/14/18  3:20 AM  Result Value Ref Range   Ferritin 4 (L) 11 - 307 ng/mL    Comment: Performed at [facility], [address] [place], [place] [address]  Hemoglobin and hematocrit, blood     Status: Abnormal   Collection Time: 06/14/18 11:33 AM  Result Value Ref Range   Hemoglobin 6.9 (L) 12.0 - 15.0 g/dL   HCT 22.3 (L) 36.0 - 46.0 %    Comment: Performed at [facility], [name] [place],  [address]     Imaging:  CLINICAL DATA:  Right upper quadrant and a bili cul pain for 3 days. Elevated lipase. History of pancreatitis.  EXAM: CT ABDOMEN AND PELVIS WITH CONTRAST  TECHNIQUE: Multidetector CT imaging of the abdomen and pelvis was performed using the standard protocol following bolus administration of intravenous contrast.  CONTRAST:  178mL OMNIPAQUE IOHEXOL 300 MG/ML  SOLN  COMPARISON:  None.  FINDINGS: Lower chest: Dependent lower lobe atelectasis bilaterally with tiny right pleural effusion.  Hepatobiliary: Heterogeneous liver perfusion. 15 mm low-density lesion in the medial right liver posterior to the IVC cannot be definitively characterized. Gallbladder wall has an irregular appearance and appears nodular in some regions. No intrahepatic or extrahepatic biliary dilation.  Pancreas: Pancreatic parenchyma is ill-defined in the head and body of pancreas with some dilatation of the main duct in the body. 2.7 x 2.3 cm low-density lesion in the body of pancreas has attenuation too high to be a simple cyst. Adjacent low-density lesions in the uncinate process measure approximately 17 mm each. There is peripancreatic edema.  Spleen: No splenomegaly. No focal mass lesion.  Adrenals/Urinary Tract: No adrenal nodule or mass. Kidneys unremarkable. No evidence for hydroureter. The urinary bladder appears normal for the degree of  distention.  Stomach/Bowel: Stomach is unremarkable. No gastric wall thickening. No evidence of outlet obstruction. Duodenum is normally positioned as is the ligament of Treitz. No small bowel wall thickening. No small bowel dilatation. The terminal ileum is normal. The appendix is normal. No gross colonic mass. No colonic wall thickening.  Vascular/Lymphatic: No abdominal aortic aneurysm. Nonocclusive thrombus in the main portal vein extends into the right portal vein where it appears occlusive. Nonocclusive thrombus identified in the superior mesenteric vein (37/2). Splenic vein appears occluded. There is no gastrohepatic or  hepatoduodenal ligament lymphadenopathy. No intraperitoneal or retroperitoneal lymphadenopathy. No pelvic sidewall lymphadenopathy.  Reproductive: Large fibroid burden noted in the uterus with multiple exophytic and pedunculated fibroids evident. There is no adnexal mass.  Other: Moderate volume ascites noted in the abdomen and pelvis. There is mesenteric edema.  Musculoskeletal: Diffuse body wall edema evident. No worrisome lytic or sclerotic osseous abnormality.  IMPRESSION: 1. Pancreatic head and body appear enlarged and edematous suggesting pancreatitis. There is probably some parenchymal atrophy in the tail of pancreas with mild ductal dilatation in the body of pancreas. 2.7 cm low-density lesion in the body of pancreas associated with adjacent 17 mm low-density lesions in the uncinate process. These are likely pseudocysts, but follow-up recommended as neoplasm not excluded. 2. Nonocclusive thrombus in the portal vein extends into the portal vein branch where it becomes occlusive. Nonocclusive thrombus is identified in the superior mesenteric vein in the splenic vein appears occluded. 3. Moderate volume ascites. 4. Gallbladder wall is irregular with areas of apparent nodularity. This is presumably secondary. 5. Diffuse body wall  edema.   Electronically Signed   By: Misty Stanley M.D.   On: 05/15/2018 11:14  Assessment/Plan:  1. Fibroid uterus - patient has recently undergone uterine artery embolization for large fibroids and history of symptomatic anemia with blood transfusions.  Consultation performed so that patient can establish post-operative follow up outpatient as she currently does not have a GYN.  Currently in significant pain. Will notify primary MD of pain to see if pan medications can be adjusted.   2. Symptomatic anemia - patient s/p blood transfusion this admission.  Will require iron supplementation post-transfusion. Continue to monitor. Patient may continue to havehave bleeding from fibroids and recent procedure for up to 2 weeks.  3. Portal vein thromobosis and chronic pancreatitis- to be managed by primary team.  Plans to resume Xeralto for blood clot.     Patient will need outpatient follow up with GYN in 1-2 weeks after procedure.  Usually patients are discharged within 24 hrs after procedure (however due to other comorbidities, patient may be hospitalized for some time longer).  No further inpatient follow up required from GYN unless complications arise.  Will sign off for now, feel free to re-consult as needed.    Rubie Maid, MD [facility].  Phone/pager: [phone] 06/14/2018

## 2018-06-14 NOTE — Op Note (Signed)
[facility] Percutaneous Study/Intervention Procedural Note   Date of Surgery:  06/14/2018  Surgeon(s): Leotis Pain  Assistants:none  Pre-operative Diagnosis: Symptomatic uterine fibroids with dysfunctional uterine bleeding and severe anemia  Post-operative diagnosis: Same  Procedure(s) Performed: 1. Ultrasound guidance for vascular access right femoral artery 2. Catheter placement into left uterine artery and right uterine artery from right femoral approach 3. Aortogram and selective pelvic arteriograms including selective imaging of both uterine arteries 4. Micro-bead embolization with 10 cc of 700-900  polyvinyl alcohol beads to the left uterine artery and coil embolization with two 4 mm Ruby coils to the left uterine artery 5. Micro-bead embolization with 10 cc of 500-700  polyvinyl alcohol beads to the right uterine artery and coil embolization with two 4 mm Ruby coils to the right uterine artery  6. StarClose closure device right femoral artery  EBL: 5 cc  Contrast: 70 cc  Fluoro Time: 9.8 min  Moderate Conscious Sedation time: approximately 35 minutes using 5 mg of Versed and 150 Mcg of Fentanyl  Indications: Patient is a 43 y.o. female with a very anemia after starting anticoagulation from uterine fibroids.  She has ongoing bleeding and her hemoglobin is 4.5 on admission.  Even after transfusion, she is right around 7.  Particularly in an urgent situation, uterine artery embolization to stop the bleeding is preferred. Risks and benefits are discussed and informed consent is obtained  Procedure: The patient was identified and appropriate procedural time out was performed. The patient was then placed supine on the table and prepped and draped in the usual sterile fashion. Moderate conscious sedation was administered throughout the procedure with a face to face encounter  with the patient throughout and with my supervision of the RN administering medicines and monitoring the patients vital signs and mental status throughout from the start of the procedure until the patient was taken to the recovery room.  Ultrasound was used to evaluate the right common femoral artery. It was patent . A digital ultrasound image was acquired. A Seldinger needle was used to access the right common femoral artery under direct ultrasound guidance and a permanent image was performed. A 0.035 J wire was advanced without resistance and a 5Fr sheath was placed. No heparin was given due to her severe anemia and ongoing bleeding.  Pigtail catheter was placed into the aorta and an AP aortogram was performed. This demonstrated normal aorta and iliac segments without significant stenosis.  The renal arteries were not particularly well seen and this may have been due to catheter position.  On the initial imaging, significant flow through larger than average uterine arteries was identified and on the initial aortogram. I then crossed the aortic bifurcation and advanced to the left iliac bifurcation. Using a Kumpe catheter, I was able to cannulate the hypogastric artery on the left. Selective imaging was performed of the hypogastric artery which demonstrated what appeared to be a trifurcation of the primary hypogastric artery with uterine artery being 1 of the branches off of the largest initial downgoing branch. Using a pro-great micro catheter, I was able to cannulate the left uterine artery and advance beyond its initial branches well into the left uterine artery.  Selective imaging was performed. I then performed left uterine artery embolization using approximately 10 cc of 700-900  polyvinyl alcohol beads into the left uterine artery for selective embolization until there was minimal forward flow into the uterine artery going to the uterus.  Given the acute nature of the  situation with ongoing bleeding  and severe anemia, I elected to perform coil embolization as well to ensure cessation of bleeding.  A 4 mm x 25 cm soft Ruby coil and a 4 mm x 15 cm standard Ruby coil were used to embolize the proximal to mid left uterine artery.  Successful embolization was confirmed with minimal flow beyond the coils.  The microcatheter was then removed. I then turned my attention to the right uterine artery. I was able to cannulate the right hypogastric artery with the rim catheter from the right femoral approach and using a Glidewire advanced this into the main right hypogastric artery. Selective imaging was performed in the right hypogastric artery which demonstrated another trifurcation essentially with the largest downgoing branch being the site of origin from the uterine artery which was a lateral branch approximately 1 cm beyond the primary branches origin.  I was able to advance the pro-great microcatheter into the uterine artery and down beyond its primary branches near where it entered the uterus itself. Selective imaging was performed. Approximately 10 cc of the 500-700  polyvinyl alcohol beads were then instilled into the right uterine artery for selective embolization until there was minimal forward flow into the uterus from the right uterine artery.  Again, due to the severe anemia and the ongoing bleeding, I elected to perform coil embolization of the main right uterine artery as well.  A 4 mm diameter by 15 cm length soft Ruby coil was deployed first followed by 4 mm diameter by 10 cm length standard coil to pack this and.  Imaging following this showed successful embolization with essentially no forward flow through the right uterine artery.  The diagnostic catheter was then removed. I elected to terminate the procedure. The sheath was removed and StarClose closure device was deployed in the right femoral artery with excellent hemostatic result. The patient was taken to the recovery room in stable condition  having tolerated the procedure well.      Disposition: Patient was taken to the recovery room in stable condition having tolerated the procedure well.  Complications: None

## 2018-06-14 NOTE — Progress Notes (Signed)
[facility] [place] NAME: Amy Wolfe    MR#:  [id number]  DATE OF BIRTH:  10-04-1975  SUBJECTIVE: Patient has been having bleeding for a long time but did not see any physician and she has no PCP.  Recently found to have portal vein thrombosis and started on Xarelto, reviewed the note from Dr. Laurence Compton.  Patient has no PCP, no gynecologist.  CT abdomen and pelvis on admission showed multiple uterine fibroids, patient admitted to medical service because of symptomatic anemia, as she was on Xarelto she received Kcentra.  Received 3 units of blood transfusion, hemoglobin improved from 4.5-7.1.  CHIEF COMPLAINT:   Chief Complaint  Patient presents with  . Vaginal Bleeding  . Abdominal Pain   Still has abdominal pain REVIEW OF SYSTEMS:   ROS CONSTITUTIONAL: No fever, fatigue or weakness.  EYES: No blurred or double vision.  EARS, NOSE, AND THROAT: No tinnitus or ear pain.  RESPIRATORY: No cough, shortness of breath, wheezing or hemoptysis.  CARDIOVASCULAR: No chest pain, orthopnea, edema.  GASTROINTESTINAL: Has abdominal pain, patient has chronic abdominal pain secondary to chronic pancreatitis.  GENITOURINARY: No dysuria, hematuria.  ENDOCRINE: No polyuria, nocturia,  HEMATOLOGY: No anemia, easy bruising or bleeding SKIN: No rash or lesion. MUSCULOSKELETAL: No joint pain or arthritis.   NEUROLOGIC: No tingling, numbness, weakness.  PSYCHIATRY: No anxiety or depression.   DRUG ALLERGIES:  No Known Allergies  VITALS:  Blood pressure 111/72, pulse 79, temperature 98.7 F (37.1 C), temperature source Oral, resp. rate 18, height 5\' 7"  (1.702 m), weight 67.3 kg, last menstrual period 05/28/2018, SpO2 100 %.  PHYSICAL EXAMINATION:  GENERAL:  43 y.o.-year-old patient lying in the bed with no acute distress.  EYES: Pupils equal, round, reactive to light and accommodation. No scleral icterus. Extraocular muscles intact.  HEENT: Head  atraumatic, normocephalic. Oropharynx and nasopharynx clear.  NECK:  Supple, no jugular venous distention. No thyroid enlargement, no tenderness.  LUNGS: Normal breath sounds bilaterally, no wheezing, rales,rhonchi or crepitation. No use of accessory muscles of respiration.  CARDIOVASCULAR: S1, S2 normal. No murmurs, rubs, or gallops.  ABDOMEN:  epigastric abdominal tenderness present no rebound tenderness.  EXTREMITIES: No pedal edema, cyanosis, or clubbing.  NEUROLOGIC: Cranial nerves II through XII are intact. Muscle strength 5/5 in all extremities. Sensation intact. Gait not checked.  PSYCHIATRIC: The patient is alert and oriented x 3.  SKIN: No obvious rash, lesion, or ulcer.    LABORATORY PANEL:   CBC Recent Labs  Lab 06/13/18 1546 06/14/18 0320  WBC 5.5  --   HGB 4.5* 7.1*  HCT 15.7* 22.9*  PLT 334  --    ------------------------------------------------------------------------------------------------------------------  Chemistries  Recent Labs  Lab 06/13/18 1546 06/14/18 0320  NA 134* 136  K 3.7 3.2*  CL 104 109  CO2 24 23  GLUCOSE 130* 96  BUN 10 8  CREATININE 0.48 0.43*  CALCIUM 8.6* 7.6*  AST 14*  --   ALT 10  --   ALKPHOS 96  --   BILITOT 0.2*  --    ------------------------------------------------------------------------------------------------------------------  Cardiac Enzymes No results for input(s): TROPONINI in the last 168 hours. ------------------------------------------------------------------------------------------------------------------  RADIOLOGY:  No results found.  EKG:   Orders placed or performed during the hospital encounter of 06/13/18  . ED EKG  . ED EKG    ASSESSMENT AND PLAN:  43 year old female patient with history of chronic pancreatitis, recently diagnosed portal vein thrombosis on Xarelto comes in because of symptomatic  anemia.  Patient has been having vaginal bleeding for a long time but did not seek any medical  attention and has no PCP.  Generalized weakness, fatigue, dizziness secondary to symptomatic anemia, patient feels better after blood transfusion but she told me that she is having vaginal bleeding for weeks but did not seek any medical attention.  Continue to take blood thinners.  3 /acute blood loss anemia secondary to uterine fibroids, received clearance of blood transfusion, hemoglobin improved from 4.5-7.1. 3.  Uterine fibroids, vascular surgery is planning to do uterine artery embolization, will consult gynecology. 4.  Chronic pancreatitis, recently diagnosed portal vein thrombosis, started on Xarelto a month ago 5.  Vaginal bleeding with acute blood loss anemia, unable to use anticoagulation for portal vein thrombosis, patient received Kcentra to reverse Xarelto effect. Epic text messaged vascular.  Also consult gynecology, will update the family. Uterine fibroid bleeding secondary to severe anemia. All the records are reviewed and case discussed with Care Management/Social Workerr. Management plans discussed with the patient, family and they are in agreement.  CODE STATUS: full code  TOTAL TIME TAKING CARE OF THIS PATIENT:14minutes.   POSSIBLE D/C IN 1-2 DAYS, DEPENDING ON CLINICAL CONDITION.   more Than 50% of the time spent in counseling, coordination of care. Epifanio Lesches M.D on 06/14/2018 at 9:34 AM  Between 7am to 6pm - Pager - [phone]  After 6pm go to [email or web address] - password [email or web address] [facility]  Office  [phone]  CC: Primary care physician; Patient, [name]   Note: This dictation was prepared with Dragon dictation along with smaller phrase technology. Any transcriptional errors that result from this process are unintentional.

## 2018-06-14 NOTE — Progress Notes (Signed)
Paged on call hospitalist to inform of abnormal lab values.

## 2018-06-14 NOTE — Progress Notes (Addendum)
Patient continues to have significant pain. Dr Shelia Media on call notified and ordered pca to be modified to include patient getting basal morphine of 2mg /hr.   Earlier the patient insisted on getting in the shower. I informed her that she had an IV and it couldn't be stopped or she wouldn't get her pain medicine through her pca. She then said she was getting in the shower and took her gown off and got in the shower. IV was covered as good as possible. She was given a BSC to sit on while allowing the water to run down her back.

## 2018-06-14 NOTE — Consult Note (Signed)
[facility] Vascular Consult Note  MRN : [id number]  Amy Wolfe is a 43 y.o. (July 08, 1975) female who presents with chief complaint of  Chief Complaint  Patient presents with  . Vaginal Bleeding  . Abdominal Pain   History of Present Illness:  The patient is a 43 year old female with a past medical history of thyroid disease, pancreatitis, liver abscess, hypertension, coronary artery disease, portal vein thrombosis on anticoagulation who presented to the [facility] emergency department with dysfunctional uterine bleeding due to fibroids and symptomatic anemia.  The patient endorses a history impressively worsening abdominal pain located primarily to the mid upper/pelvic area.  The patient also notes she has been experiencing vaginal bleeding for approximately 2 weeks.  Describes the bleeding is extremely heavy and full clots.  Patient does have a history of large uterine fibroids.  Patient also has a history of portal vein thrombosis and was placed on Xarelto for anticoagulation.  On presentation to the emergency department the patient's hemoglobin was 4.5.  The patient was admitted to the medicine service underwent reversal of her Xarelto and received 3 units packed red blood cells.  The surgery was consulted by Dr. Jerelyn Charles for possible uterine embolization Current Facility-Administered Medications  Medication Dose Route Frequency Provider Last Rate Last Dose  . 0.9 %  sodium chloride infusion  250 mL Intravenous PRN Salary, Montell D, MD      . 0.9 %  sodium chloride infusion   Intravenous Continuous Salary, Montell D, MD 100 mL/hr at 06/14/18 1138    . acetaminophen (TYLENOL) tablet 650 mg  650 mg Oral Q6H PRN Salary, Montell D, MD       Or  . acetaminophen (TYLENOL) suppository 650 mg  650 mg Rectal Q6H PRN Salary, Montell D, MD      . albuterol (PROVENTIL) (2.5 MG/3ML) 0.083% nebulizer solution 2.5 mg  2.5 mg Nebulization Q6H PRN  Salary, Montell D, MD      . HYDROcodone-acetaminophen (NORCO/VICODIN) 5-325 MG per tablet 1 tablet  1 tablet Oral Q4H PRN Salary, Montell D, MD   1 tablet at 06/14/18 1135  . lipase/protease/amylase (CREON) capsule 24,000 Units  24,000 Units Oral TID AC Salary, Montell D, MD      . ondansetron (ZOFRAN) tablet 4 mg  4 mg Oral Q6H PRN Salary, Montell D, MD       Or  . ondansetron (ZOFRAN) injection 4 mg  4 mg Intravenous Q6H PRN Salary, Montell D, MD      . pantoprazole (PROTONIX) EC tablet 40 mg  40 mg Oral Daily Salary, Montell D, MD      . polyethylene glycol (MIRALAX / GLYCOLAX) packet 17 g  17 g Oral Daily PRN Salary, Montell D, MD      . sodium chloride flush (NS) 0.9 % injection 3 mL  3 mL Intravenous Q12H Salary, Montell D, MD      . sodium chloride flush (NS) 0.9 % injection 3 mL  3 mL Intravenous PRN Salary, Avel Peace, MD       Past Medical History:  Diagnosis Date  . Coronary artery disease   . H/O blood clots   . Hypertension   . Liver abscess   . Pancreatitis   . Thyroid disease   . Uterine fibroid    Past Surgical History:  Procedure Laterality Date  . CESAREAN SECTION    . THYROIDECTOMY, PARTIAL     Social History Social History   Tobacco Use  .  Smoking status: Current Every Day Smoker    Types: Cigarettes  . Smokeless tobacco: Never Used  Substance Use Topics  . Alcohol use: Not Currently  . Drug use: Yes    Types: Marijuana   Family History Family History  Problem Relation Age of Onset  . Hypertension Mother   . Diabetes Mother   . Cancer Father   . Cancer Paternal Grandmother   . Cancer Paternal Grandfather   Denies family history of peripheral artery disease, venous disease, and/or renal/clotting disorders.  No Known Allergies  REVIEW OF SYSTEMS (Negative unless checked)  Constitutional: [] Weight loss  [] Fever  [] Chills Cardiac: [] Chest pain   [] Chest pressure   [] Palpitations   [] Shortness of breath when laying flat   [] Shortness of breath at  rest   [x] Shortness of breath with exertion. Vascular:  [] Pain in legs with walking   [] Pain in legs at rest   [] Pain in legs when laying flat   [] Claudication   [] Pain in feet when walking  [] Pain in feet at rest  [] Pain in feet when laying flat   [] History of DVT   [] Phlebitis   [] Swelling in legs   [] Varicose veins   [] Non-healing ulcers Pulmonary:   [] Uses home oxygen   [] Productive cough   [] Hemoptysis   [] Wheeze  [] COPD   [] Asthma Neurologic:  [] Dizziness  [] Blackouts   [] Seizures   [] History of stroke   [] History of TIA  [] Aphasia   [] Temporary blindness   [] Dysphagia   [] Weakness or numbness in arms   [] Weakness or numbness in legs Musculoskeletal:  [] Arthritis   [] Joint swelling   [] Joint pain   [] Low back pain Hematologic:  [] Easy bruising  [x] Easy bleeding   [x] Hypercoagulable state   [x] Anemic  [] Hepatitis Gastrointestinal:  [] Blood in stool   [] Vomiting blood  [] Gastroesophageal reflux/heartburn   [] Difficulty swallowing. Genitourinary:  [] Chronic kidney disease   [] Difficult urination  [] Frequent urination  [] Burning with urination   [] Blood in urine Skin:  [] Rashes   [] Ulcers   [] Wounds Psychological:  [] History of anxiety   []  History of major depression.  Physical Examination  Vitals:   06/13/18 2016 06/13/18 2141 06/13/18 2204 06/14/18 0102  BP:  103/68 111/69 111/72  Pulse:  82 79 79  Resp:  18  18  Temp:  99.1 F (37.3 C) 98.6 F (37 C) 98.7 F (37.1 C)  TempSrc:  Oral Oral Oral  SpO2:  100% 100% 100%  Weight: 67.3 kg     Height: 5\' 7"  (1.702 m)      Body mass index is 23.24 kg/m. Gen:  WD/WN, NAD Head: [place]/AT, No temporalis wasting. Prominent temp pulse not noted. Ear/Nose/Throat: Hearing grossly intact, nares w/o erythema or drainage, oropharynx w/o Erythema/Exudate Eyes: Sclera non-icteric, conjunctiva clear Neck: Trachea midline.  No JVD.  Pulmonary:  Good air movement, respirations not labored, equal bilaterally.  Cardiac: RRR, normal S1, S2. Vascular:   Vessel Right Left  Radial Palpable Palpable  Ulnar Palpable Palpable  Brachial Palpable Palpable  Carotid Palpable, without bruit Palpable, without bruit  Aorta Not palpable N/A  Femoral Palpable Palpable  Popliteal Palpable Palpable  PT Palpable Palpable  DP Palpable Palpable   Gastrointestinal: soft, non-tender/non-distended. No guarding/reflex.  Musculoskeletal: M/S 5/5 throughout.  Extremities without ischemic changes.  No deformity or atrophy. No edema. Neurologic: Sensation grossly intact in extremities.  Symmetrical.  Speech is fluent. Motor exam as listed above. Psychiatric: Judgment intact, Mood & affect appropriate for pt's clinical situation. Dermatologic: No rashes or  ulcers noted.  No cellulitis or open wounds. Lymph : No Cervical, Axillary, or Inguinal lymphadenopathy.  CBC Lab Results  Component Value Date   WBC 5.5 06/13/2018   HGB 6.9 (L) 06/14/2018   HCT 22.3 (L) 06/14/2018   MCV 70.4 (L) 06/13/2018   PLT 334 06/13/2018   BMET    Component Value Date/Time   NA 136 06/14/2018 0320   K 3.2 (L) 06/14/2018 0320   CL 109 06/14/2018 0320   CO2 23 06/14/2018 0320   GLUCOSE 96 06/14/2018 0320   BUN 8 06/14/2018 0320   CREATININE 0.43 (L) 06/14/2018 0320   CALCIUM 7.6 (L) 06/14/2018 0320   GFRNONAA >60 06/14/2018 0320   GFRAA >60 06/14/2018 0320   Estimated Creatinine Clearance: 90.9 mL/min (A) (by C-G formula based on SCr of 0.43 mg/dL (L)).  COAG No results found for: INR, PROTIME  Radiology Mr Abdomen W Wo Contrast  Result Date: 05/31/2018 CLINICAL DATA:  Chronic pancreatitis follow-up. EXAM: MRI ABDOMEN WITHOUT AND WITH CONTRAST TECHNIQUE: Multiplanar multisequence MR imaging of the abdomen was performed both before and after the administration of intravenous contrast. CONTRAST:  CT 05/15/2018 COMPARISON:  CT 05/15/2018 FINDINGS: Lower chest:  Small RIGHT effusion. Hepatobiliary: No intrahepatic biliary duct dilatation. No gallstones. The common bile  duct is normal caliber. The liver parenchyma is normal without evidence of abscess. No hepatic steatosis. Pancreas: Head of the pancreas remains edematous. There is a cystic collection in the mid pancreas towards the pancreatic neck measuring 2.7 by 2.4 cm (image 18/3). This collection is similar to 2.6 x 2.3 cm on comparison exam. There is internal debris within this cystic collection without enhancement. Proximal to this cystic collection the pancreatic duct is dilated and ectatic (image 16/3). No enhancing lesions within the pancreas.No additional pseudocyst formation outside of the pancreas. Spleen: Normal spleen Adrenals/urinary tract: Adrenal glands and kidneys are normal. Stomach/Bowel: Stomach and limited of the small bowel is unremarkable Vascular/Lymphatic: Abdominal aortic normal caliber. No retroperitoneal periportal lymphadenopathy. 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 Venous vasculature is somewhat difficult evaluate due to motion degradation. Vascular occlusions better depicted on comparison CT. Musculoskeletal: No aggressive osseous lesion IMPRESSION: 1. 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 The cysts either compresses or communicates with the pancreatic duct as the duct is dilated upstream of this cystic complex. Cystic complex not significant changed from CT 05/15/2018. 2. Head of the pancreas remains edematous consistent with chronic pancreatitis. No additional organized fluid collections identified. 3. No cholelithiasis. Common bile duct normal caliber. No intrahepatic duct dilatation. No enhancing hepatic lesion. No hepatic steatosis. 4. 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 Electronically Signed   By: Suzy Bouchard M.D.   On: 05/31/2018 16:22   US Venous Img Lower Bilateral  Result Date: 05/15/2018 CLINICAL DATA:  Pain and swelling for 1 week EXAM: BILATERAL LOWER EXTREMITY VENOUS DOPPLER  ULTRASOUND TECHNIQUE: Gray-scale sonography with graded compression, as well as color Doppler and duplex ultrasound were performed to evaluate the lower extremity deep venous systems from the level of the common femoral vein and including the common femoral, femoral, profunda femoral, popliteal and calf veins including the posterior tibial, peroneal and gastrocnemius veins when visible. The superficial great saphenous vein was also interrogated. Spectral Doppler was utilized to evaluate flow at rest and with distal augmentation maneuvers in the common femoral, femoral and popliteal veins. COMPARISON:  None. FINDINGS: RIGHT LOWER EXTREMITY Common Femoral Vein: No evidence of thrombus.  Normal compressibility, respiratory phasicity and response to augmentation. Saphenofemoral Junction: No evidence of thrombus. Normal compressibility and flow on color Doppler imaging. Profunda Femoral Vein: No evidence of thrombus. Normal compressibility and flow on color Doppler imaging. Femoral Vein: No evidence of thrombus. Normal compressibility, respiratory phasicity and response to augmentation. Popliteal Vein: No evidence of thrombus. Normal compressibility, respiratory phasicity and response to augmentation. Calf Veins: No evidence of thrombus. Normal compressibility and flow on color Doppler imaging. Superficial Great Saphenous Vein: No evidence of thrombus. Normal compressibility. Venous Reflux:  Not assessed Other Findings:  None. LEFT LOWER EXTREMITY Common Femoral Vein: No evidence of thrombus. Normal compressibility, respiratory phasicity and response to augmentation. Saphenofemoral Junction: No evidence of thrombus. Normal compressibility and flow on color Doppler imaging. Profunda Femoral Vein: No evidence of thrombus. Normal compressibility and flow on color Doppler imaging. Femoral Vein: No evidence of thrombus. Normal compressibility, respiratory phasicity and response to augmentation. Popliteal Vein: No evidence of  thrombus. Normal compressibility, respiratory phasicity and response to augmentation. Calf Veins: No evidence of thrombus. Normal compressibility and flow on color Doppler imaging. Superficial Great Saphenous Vein: No evidence of thrombus. Normal compressibility. Venous Reflux:  Not assessed Other Findings:  None. IMPRESSION: No evidence of significant deep venous thrombosis in either lower extremity. Electronically Signed   By: Jerilynn Mages.  Shick M.D.   On: 05/15/2018 12:40   Assessment/Plan The patient is a 43 year old female with a past medical history of thyroid disease, pancreatitis, liver abscess, hypertension, coronary artery disease, portal vein thrombosis on anticoagulation who presented to [facility] emergency department with dysfunctional uterine bleeding due to fibroids and symptomatic anemia. 1.  Dysfunctional uterine bleeding: Patient was on Xarelto for portal vein thrombosis.  The patient also has a past medical history of fibroids / uterine bleeding for approximately 2 weeks causing symptomatic anemia.  Patient will need to continue anticoagulation in the setting of portal vein thrombosis and therefore would recommend embolization of the patient's uterine fibroids in an attempt to improve bleeding/anemia.  Procedure, risks and benefits explained to the patient all questions answered.  Patient wishes to proceed.  The patient will need to be followed by a GYN s/p embolization as we do not conduct uterine ultrasounds in our outpatient practice. 2. Hypertension: Encouraged good control as its slows the progression of atherosclerotic disease 3. CAD: Asymptomatic at this time.  Discussed with Dr. Mayme Genta, PA-C  06/14/2018 12:23 PM  This note was created with Dragon medical transcription system.  Any error is purely unintentional

## 2018-06-15 LAB — CBC
HCT: 21.2 % — ABNORMAL LOW (ref 36.0–46.0)
Hemoglobin: 6.6 g/dL — ABNORMAL LOW (ref 12.0–15.0)
MCH: 23.2 pg — ABNORMAL LOW (ref 26.0–34.0)
MCHC: 31.1 g/dL (ref 30.0–36.0)
MCV: 74.4 fL — ABNORMAL LOW (ref 80.0–100.0)
Platelets: 252 10*3/uL (ref 150–400)
RBC: 2.85 MIL/uL — ABNORMAL LOW (ref 3.87–5.11)
RDW: 19.9 % — ABNORMAL HIGH (ref 11.5–15.5)
WBC: 7.3 10*3/uL (ref 4.0–10.5)
nRBC: 0 % (ref 0.0–0.2)

## 2018-06-15 LAB — HIV ANTIBODY (ROUTINE TESTING W REFLEX): HIV Screen 4th Generation wRfx: NONREACTIVE

## 2018-06-15 LAB — BASIC METABOLIC PANEL
Anion gap: 7 (ref 5–15)
BUN: 7 mg/dL (ref 6–20)
CO2: 24 mmol/L (ref 22–32)
Calcium: 8.3 mg/dL — ABNORMAL LOW (ref 8.9–10.3)
Chloride: 108 mmol/L (ref 98–111)
Creatinine, Ser: 0.5 mg/dL (ref 0.44–1.00)
GFR calc Af Amer: >60 mL/min (ref >60)
GFR calc non Af Amer: >60 mL/min (ref >60)
Glucose, Bld: 75 mg/dL (ref 70–99)
Potassium: 3.6 mmol/L (ref 3.5–5.1)
Sodium: 139 mmol/L (ref 135–145)

## 2018-06-15 LAB — PREPARE RBC (CROSSMATCH)

## 2018-06-15 LAB — HEMOGLOBIN AND HEMATOCRIT, BLOOD
HCT: 30.4 % — ABNORMAL LOW (ref 36.0–46.0)
Hemoglobin: 9.5 g/dL — ABNORMAL LOW (ref 12.0–15.0)

## 2018-06-15 LAB — MAGNESIUM: Magnesium: 1.8 mg/dL (ref 1.7–2.4)

## 2018-06-15 MED ORDER — SODIUM CHLORIDE 0.9% IV SOLUTION
Freq: Once | INTRAVENOUS | Status: AC
  Administered 2018-06-15: 14:00:00 via INTRAVENOUS

## 2018-06-15 MED ORDER — SODIUM CHLORIDE 0.9% IV SOLUTION
Freq: Once | INTRAVENOUS | Status: AC
  Administered 2018-06-15: 11:00:00 via INTRAVENOUS

## 2018-06-15 MED ORDER — SODIUM CHLORIDE 0.9% FLUSH
10.0000 mL | Freq: Two times a day (BID) | INTRAVENOUS | Status: DC
  Administered 2018-06-15 – 2018-06-17 (×5): 10 mL

## 2018-06-15 MED ORDER — SODIUM CHLORIDE 0.9% FLUSH: 10.0000 mL | INTRAVENOUS | Status: DC | PRN

## 2018-06-15 MED ORDER — ONDANSETRON HCL 4 MG/2ML IJ SOLN
4.0000 mg | Freq: Once | INTRAMUSCULAR | Status: AC
  Administered 2018-06-15: 4 mg via INTRAVENOUS
  Filled 2018-06-15: qty 2

## 2018-06-15 MED ORDER — ALUM & MAG HYDROXIDE-SIMETH 200-200-20 MG/5ML PO SUSP: 30.0000 mL | Freq: Four times a day (QID) | ORAL | Status: DC | PRN

## 2018-06-15 NOTE — Progress Notes (Signed)
Blood stopped and doctor notified of pt vomitting. One time order of zofran to be given and blood to be resume in 30 minutes as long as pts nausea is gone.

## 2018-06-15 NOTE — Progress Notes (Signed)
Called Dr. Marcille Blanco regarding patient's hemoglobin of 6.6. Day shift staff will be notified.  Will continue to monitor.  Christene Slates  06/15/2018  6:52 AM

## 2018-06-15 NOTE — Progress Notes (Signed)
Subjective: Interval History: has complaints significant pain and sloughing...  Is less than 24 hours status post bilateral uterine artery embolization with polyvinyl alcohol and Ruby coils.  Hemoglobin has drifted slightly, from 6.9 prior to the procedure yesterday to 6.6 this morning.  Morphine pump was increased with a basal rate given yesterday afternoon to supplement her as needed every 10 minute request rate.  She says the basal has made a difference.  He notes no insertion site complication  Objective: Vital signs in last 24 hours: Temp:  [97.8 F (36.6 C)-98.3 F (36.8 C)] 97.8 F (36.6 C) (06/13 0541) Pulse Rate:  [63-85] 63 (06/13 0541) Resp:  [9-20] 11 (06/13 0615) BP: (102-166)/(53-107) 118/76 (06/13 0541) SpO2:  [98 %-100 %] 100 % (06/13 0615)  Intake/Output from previous day: 06/12 0701 - 06/13 0700 In: 604.5 [I.V.:604.5] Out: 300 [Emesis/NG output:300] Intake/Output this shift: No intake/output data recorded.  General appearance: alert, cooperative and mild distress Incision/Wound: No insertion site complication  Lab Results: Recent Labs    06/13/18 1546  06/14/18 1133 06/15/18 0454  WBC 5.5  --   --  7.3  HGB 4.5*   < > 6.9* 6.6*  HCT 15.7*   < > 22.3* 21.2*  PLT 334  --   --  252   < > = values in this interval not displayed.   BMET Recent Labs    06/14/18 0320 06/15/18 0454  NA 136 139  K 3.2* 3.6  CL 109 108  CO2 23 24  GLUCOSE 96 75  BUN 8 7  CREATININE 0.43* 0.50  CALCIUM 7.6* 8.3*    Studies/Results: Mr Abdomen W Wo Contrast  Result Date: 05/31/2018 CLINICAL DATA:  Chronic pancreatitis follow-up. EXAM: MRI ABDOMEN WITHOUT AND WITH CONTRAST TECHNIQUE: Multiplanar multisequence MR imaging of the abdomen was performed both before and after the administration of intravenous contrast. CONTRAST:  CT 05/15/2018 COMPARISON:  CT 05/15/2018 FINDINGS: Lower chest:  Small RIGHT effusion. Hepatobiliary: No intrahepatic biliary duct dilatation. No  gallstones. The common bile duct is normal caliber. The liver parenchyma is normal without evidence of abscess. No hepatic steatosis. Pancreas: Head of the pancreas remains edematous. There is a cystic collection in the mid pancreas towards the pancreatic neck measuring 2.7 by 2.4 cm (image 18/3). This collection is similar to 2.6 x 2.3 cm on comparison exam. There is internal debris within this cystic collection without enhancement. Proximal to this cystic collection the pancreatic duct is dilated and ectatic (image 16/3). No enhancing lesions within the pancreas.No additional pseudocyst formation outside of the pancreas. Spleen: Normal spleen Adrenals/urinary tract: Adrenal glands and kidneys are normal. Stomach/Bowel: Stomach and limited of the small bowel is unremarkable Vascular/Lymphatic: Abdominal aortic normal caliber. No retroperitoneal periportal lymphadenopathy. 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 Venous vasculature is somewhat difficult evaluate due to motion degradation. Vascular occlusions better depicted on comparison CT. Musculoskeletal: No aggressive osseous lesion IMPRESSION: 1. 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 The cysts either compresses or communicates with the pancreatic duct as the duct is dilated upstream of this cystic complex. Cystic complex not significant changed from CT 05/15/2018. 2. Head of the pancreas remains edematous consistent with chronic pancreatitis. No additional organized fluid collections identified. 3. No cholelithiasis. Common bile duct normal caliber. No intrahepatic duct dilatation. No enhancing hepatic lesion. No hepatic steatosis. 4. 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 Electronically Signed   By: Suzy Bouchard M.D.   On:  05/31/2018 16:22   Anti-infectives: Anti-infectives (From admission, onward)   Start     Dose/Rate Route Frequency Ordered Stop   06/14/18 1500   ceFAZolin (ANCEF) IVPB 2g/100 mL premix  Status:  Discontinued     2 g 200 mL/hr over 30 Minutes Intravenous  Once 06/14/18 1449 06/14/18 1556   06/14/18 1308  ceFAZolin (ANCEF) 2-4 GM/100ML-% IVPB    Note to Pharmacy: Despina Arias  : cabinet override      06/14/18 1308 06/14/18 1355      Assessment/Plan: s/p Procedure(s): Uterine Embolization (N/A) Continue pain supplementation, transfuse as necessary after following hemoglobin.  No plans for further intervention from a vascular standpoint.  She will likely continue to have some sloughing and bleeding given the fact that not just the bleeding areas have been taking care of but there will be a surrounding penumbra of nonviable tissue from the embolizations.  This is unavoidable.  Can go when pain acceptable and well-controlled by oral agents, hemoglobin stable, with resumption of anticoagulant at that point given her history of portal vein thrombosis   LOS: 2 days   Anaih Brander A Mendy Chou 06/15/2018, 10:14 AM

## 2018-06-15 NOTE — Progress Notes (Signed)
[facility] [place] NAME: Malli Falotico    MR#:  [id number]  DATE OF BIRTH:  07/16/1975  SUBJECTIVE:  CHIEF COMPLAINT:   Chief Complaint  Patient presents with  . Vaginal Bleeding  . Abdominal Pain    REVIEW OF SYSTEMS:  ROS ROS CONSTITUTIONAL: No fever, fatigue or weakness.  EYES: No blurred or double vision.  EARS, NOSE, AND THROAT: No tinnitus or ear pain.  RESPIRATORY: No cough, shortness of breath, wheezing or hemoptysis.  CARDIOVASCULAR: No chest pain, orthopnea, edema.  GASTROINTESTINAL:  Has abdominal pain, patient has chronic abdominal pain secondary to chronic pancreatitis.  GENITOURINARY: No dysuria, hematuria.  ENDOCRINE: No polyuria, nocturia,  HEMATOLOGY: No anemia, easy bruising or bleeding SKIN: No rash or lesion. MUSCULOSKELETAL: No joint pain or arthritis.   NEUROLOGIC: No tingling, numbness, weakness.  PSYCHIATRY: No anxiety or depression.   DRUG ALLERGIES:  No Known Allergies VITALS:  Blood pressure 138/84, pulse 69, temperature 98.2 F (36.8 C), temperature source Oral, resp. rate 18, height 5\' 7"  (1.702 m), weight 67.3 kg, last menstrual period 05/28/2018, SpO2 100 %. PHYSICAL EXAMINATION:  Physical Exam  GENERAL:  43 y.o.-year-old patient lying in the bed with no acute distress.  EYES: Pupils equal, round, reactive to light and accommodation. No scleral icterus. Extraocular muscles intact.  HEENT: Head atraumatic, normocephalic. Oropharynx and nasopharynx clear.  NECK:  Supple, no jugular venous distention. No thyroid enlargement, no tenderness.  LUNGS: Normal breath sounds bilaterally, no wheezing, rales,rhonchi or crepitation. No use of accessory muscles of respiration.  CARDIOVASCULAR: S1, S2 normal. No murmurs, rubs, or gallops.  ABDOMEN:  epigastric abdominal tenderness present no rebound tenderness.   EXTREMITIES: No pedal edema, cyanosis, or clubbing.  NEUROLOGIC: Cranial nerves II through XII  are intact. Muscle strength 5/5 in all extremities. Sensation intact. Gait not checked.  PSYCHIATRIC: The patient is alert and oriented x 3.  SKIN: No obvious rash, lesion, or ulcer.  LABORATORY PANEL:  Female CBC Recent Labs  Lab 06/15/18 0454  WBC 7.3  HGB 6.6*  HCT 21.2*  PLT 252   ------------------------------------------------------------------------------------------------------------------ Chemistries  Recent Labs  Lab 06/13/18 1546  06/15/18 0454  NA 134*   < > 139  K 3.7   < > 3.6  CL 104   < > 108  CO2 24   < > 24  GLUCOSE 130*   < > 75  BUN 10   < > 7  CREATININE 0.48   < > 0.50  CALCIUM 8.6*   < > 8.3*  MG  --   --  1.8  AST 14*  --   --   ALT 10  --   --   ALKPHOS 96  --   --   BILITOT 0.2*  --   --    < > = values in this interval not displayed.   RADIOLOGY:  No results found. ASSESSMENT AND PLAN:   43 year old female patient with history of chronic pancreatitis, recently diagnosed portal vein thrombosis on Xarelto comes in because of symptomatic anemia.  Patient has been having vaginal bleeding for a long time but did not seek any medical attention and has no PCP.  1.Generalized weakness, fatigue, dizziness secondary to symptomatic anemia Patient status post 3 units of packed red blood cell transfusion previously and feeling better.  Being transfused with 2 more units of packed red blood cells today due to dropping hemoglobin to 6.6.    2. Acute blood  loss anemia secondary to uterine fibroids,  Drop in hemoglobin to 6.6 today and being transfused with 2 more units of packed red blood cells today.  Follow-up on CBC in a.m.    3.  Uterine fibroids Patient seen by vascular surgery.  Status post bilateral uterine artery embolization.  Likely continue to have some sloughing and some bleeding per vascular surgeon given the fact that not just the bleeding areas have been taking care of but there will be a surrounding penumbra of nonviable tissue from the  embolizations.  This is unavoidable. Patient also seen by gynecologist.  Patient to follow-up with OB/GYN physician post discharge from the hospital.  4.  Chronic pancreatitis, recently diagnosed portal vein thrombosis, started on Xarelto a month ago. anticoagulation currently on hold due to bleeding requiring multiple packed red blood cell transfusions .  Will resume anticoagulation prior to discharge from the hospital as long as hemoglobin remained stable.  5.  Vaginal bleeding with acute blood loss anemia, unable to use anticoagulation for portal vein thrombosis, patient received Kcentra to reverse Xarelto effect.  DVT prophylaxis; SCDs   All the records are reviewed and case discussed with Care Management/Social Worker. Management plans discussed with the patient, family and they are in agreement.  CODE STATUS: Full Code  TOTAL TIME TAKING CARE OF THIS PATIENT: 38 minutes.   More than 50% of the time was spent in counseling/coordination of care: YES  POSSIBLE D/C IN 2 DAYS, DEPENDING ON CLINICAL CONDITION.   Vaeda Westall M.D on 06/15/2018 at 1:39 PM  Between 7am to 6pm - Pager - [phone]  After 6pm go to [email or web address] - Proofreader  [facility]  Office  [phone]  CC: Primary care physician; Patient, [name]  Note: This dictation was prepared with Dragon dictation along with smaller phrase technology. Any transcriptional errors that result from this process are unintentional.

## 2018-06-16 LAB — BASIC METABOLIC PANEL
Anion gap: 8 (ref 5–15)
BUN: 7 mg/dL (ref 6–20)
CO2: 25 mmol/L (ref 22–32)
Calcium: 8.2 mg/dL — ABNORMAL LOW (ref 8.9–10.3)
Chloride: 104 mmol/L (ref 98–111)
Creatinine, Ser: 0.48 mg/dL (ref 0.44–1.00)
GFR calc Af Amer: >60 mL/min (ref >60)
GFR calc non Af Amer: >60 mL/min (ref >60)
Glucose, Bld: 107 mg/dL — ABNORMAL HIGH (ref 70–99)
Potassium: 3.3 mmol/L — ABNORMAL LOW (ref 3.5–5.1)
Sodium: 137 mmol/L (ref 135–145)

## 2018-06-16 LAB — TYPE AND SCREEN
ABO/RH(D): O POS
Antibody Screen: NEGATIVE
Unit division: 0
Unit division: 0
Unit division: 0
Unit division: 0

## 2018-06-16 LAB — CBC
HCT: 26.9 % — ABNORMAL LOW (ref 36.0–46.0)
Hemoglobin: 8.6 g/dL — ABNORMAL LOW (ref 12.0–15.0)
MCH: 24.8 pg — ABNORMAL LOW (ref 26.0–34.0)
MCHC: 32 g/dL (ref 30.0–36.0)
MCV: 77.5 fL — ABNORMAL LOW (ref 80.0–100.0)
Platelets: 220 10*3/uL (ref 150–400)
RBC: 3.47 MIL/uL — ABNORMAL LOW (ref 3.87–5.11)
RDW: 20.2 % — ABNORMAL HIGH (ref 11.5–15.5)
WBC: 5.8 10*3/uL (ref 4.0–10.5)
nRBC: 0 % (ref 0.0–0.2)

## 2018-06-16 LAB — BPAM RBC
Blood Product Expiration Date: 202006112359
Blood Product Expiration Date: 202006242359
Blood Product Expiration Date: 202007082359
Blood Product Expiration Date: 202007152359
ISSUE DATE / TIME: 202006111857
ISSUE DATE / TIME: 202006112152
ISSUE DATE / TIME: 202006131125
ISSUE DATE / TIME: 202006131350
Unit Type and Rh: 5100
Unit Type and Rh: 5100
Unit Type and Rh: 5100
Unit Type and Rh: 9500

## 2018-06-16 LAB — MAGNESIUM: Magnesium: 1.8 mg/dL (ref 1.7–2.4)

## 2018-06-16 MED ORDER — RIVAROXABAN 20 MG PO TABS
20.0000 mg | ORAL_TABLET | Freq: Every day | ORAL | Status: DC
  Administered 2018-06-16 – 2018-06-17 (×2): 20 mg via ORAL
  Filled 2018-06-16 (×2): qty 1

## 2018-06-16 MED ORDER — OXYCODONE-ACETAMINOPHEN 5-325 MG PO TABS
1.0000 | ORAL_TABLET | ORAL | Status: DC | PRN
  Administered 2018-06-16 – 2018-06-17 (×6): 1 via ORAL
  Filled 2018-06-16 (×7): qty 1

## 2018-06-16 MED ORDER — MIRTAZAPINE 15 MG PO TABS
15.0000 mg | ORAL_TABLET | Freq: Every day | ORAL | Status: DC
  Administered 2018-06-16: 15 mg via ORAL
  Filled 2018-06-16: qty 1

## 2018-06-16 NOTE — Consult Note (Signed)
ANTICOAGULATION CONSULT NOTE - Initial Consult  Pharmacy Consult for Rivaroxaban Indication: PVT  No Known Allergies  Patient Measurements: Height: 5\' 7"  (170.2 cm) Weight: 148 lb 5.9 oz (67.3 kg) IBW/kg (Calculated) : 61.6  Vital Signs: Temp: 99 F (37.2 C) (06/14 1154) Temp Source: Oral (06/14 1154) BP: 122/82 (06/14 1154) Pulse Rate: 73 (06/14 1154)  Labs: Recent Labs    06/13/18 1546 06/14/18 0320  06/15/18 0454 06/15/18 1959 06/16/18 0604  HGB 4.5* 7.1*   < > 6.6* 9.5* 8.6*  HCT 15.7* 22.9*   < > 21.2* 30.4* 26.9*  PLT 334  --   --  252  --  220  CREATININE 0.48 0.43*  --  0.50  --  0.48   < > = values in this interval not displayed.    Estimated Creatinine Clearance: 90.9 mL/min (by C-G formula based on SCr of 0.48 mg/dL).   Medical History: Past Medical History:  Diagnosis Date  . Coronary artery disease   . H/O blood clots   . Hypertension   . Liver abscess   . Pancreatitis   . Thyroid disease   . Uterine fibroid     Medications:  Medications Prior to Admission  Medication Sig Dispense Refill Last Dose  . Pancrelipase, Lip-Prot-Amyl, (PANCREAZE) 69629 units CPEP Take by mouth 3 (three) times daily before meals.    06/13/2018 at Unknown time  . pantoprazole (PROTONIX) 40 MG tablet Take 40 mg by mouth daily.   06/13/2018 at prn  . XARELTO 15 MG TABS tablet TAKE 1 TABLET BY MOUTH TWICE DAILY FOR 3 WEEKS   06/13/2018 at 0800  . amLODipine (NORVASC) 5 MG tablet Take 5 mg by mouth daily.   Not Taking at Unknown time   Scheduled:  . ketorolac  30 mg Intravenous Q6H  . lipase/protease/amylase  24,000 Units Oral TID AC  . pantoprazole  40 mg Oral Daily  . sodium chloride flush  10-40 mL Intracatheter Q12H  . sodium chloride flush  3 mL Intravenous Q12H   Infusions:  . sodium chloride     PRN: sodium chloride, acetaminophen **OR** acetaminophen, albuterol, alum & mag hydroxide-simeth, ondansetron **OR** ondansetron (ZOFRAN) IV, oxyCODONE-acetaminophen,  polyethylene glycol Anti-infectives (From admission, onward)   Start     Dose/Rate Route Frequency Ordered Stop   06/14/18 1500  ceFAZolin (ANCEF) IVPB 2g/100 mL premix  Status:  Discontinued     2 g 200 mL/hr over 30 Minutes Intravenous  Once 06/14/18 1449 06/14/18 1556   06/14/18 1308  ceFAZolin (ANCEF) 2-4 GM/100ML-% IVPB    Note to Pharmacy: Despina Arias  : cabinet override      06/14/18 1308 06/14/18 1355      Assessment: Pt picked up medication (rivaroxaban) on 5/15 for complete a 21 day course of 15 mg BID followed by 20 mg once daily. The 21 day course should have ended 6/5 and pt should be taking 20 mg daily. Pt was admitted for vaginal bleeding. Pt received Kcentra on 6/11. CBC stable for 2 days.   Goal of Therapy:  Monitor platelets by anticoagulation protocol: Yes   Plan:  Will order rivaroxaban 20 mg daily. Continue to monitor CBC.   Oswald Hillock, PharmD, BCPS Clinical Pharmacist  06/16/2018,12:40 PM

## 2018-06-16 NOTE — Progress Notes (Signed)
Subjective: Interval History: none..no further significant bleeding, and pain much better. Appetite improved.   Objective: Vital signs in last 24 hours: Temp:  [98 F (36.7 C)-98.7 F (37.1 C)] 98.4 F (36.9 C) (06/14 0356) Pulse Rate:  [65-81] 77 (06/14 0356) Resp:  [11-24] 24 (06/14 0757) BP: (111-143)/(78-99) 111/78 (06/14 0356) SpO2:  [95 %-100 %] 99 % (06/14 0757)  Intake/Output from previous day: 06/13 0701 - 06/14 0700 In: 3084.6 [P.O.:720; I.V.:1764.6; Blood:600] Out: -  Intake/Output this shift: Total I/O In: 240 [P.O.:240] Out: -   General appearance: alert, cooperative and no distress  Lab Results: Recent Labs    06/15/18 0454 06/15/18 1959 06/16/18 0604  WBC 7.3  --  5.8  HGB 6.6* 9.5* 8.6*  HCT 21.2* 30.4* 26.9*  PLT 252  --  220   BMET Recent Labs    06/15/18 0454 06/16/18 0604  NA 139 137  K 3.6 3.3*  CL 108 104  CO2 24 25  GLUCOSE 75 107*  BUN 7 7  CREATININE 0.50 0.48  CALCIUM 8.3* 8.2*    Studies/Results: Mr Abdomen W Wo Contrast  Result Date: 05/31/2018 CLINICAL DATA:  Chronic pancreatitis follow-up. EXAM: MRI ABDOMEN WITHOUT AND WITH CONTRAST TECHNIQUE: Multiplanar multisequence MR imaging of the abdomen was performed both before and after the administration of intravenous contrast. CONTRAST:  CT 05/15/2018 COMPARISON:  CT 05/15/2018 FINDINGS: Lower chest:  Small RIGHT effusion. Hepatobiliary: No intrahepatic biliary duct dilatation. No gallstones. The common bile duct is normal caliber. The liver parenchyma is normal without evidence of abscess. No hepatic steatosis. Pancreas: Head of the pancreas remains edematous. There is a cystic collection in the mid pancreas towards the pancreatic neck measuring 2.7 by 2.4 cm (image 18/3). This collection is similar to 2.6 x 2.3 cm on comparison exam. There is internal debris within this cystic collection without enhancement. Proximal to this cystic collection the pancreatic duct is dilated and ectatic  (image 16/3). No enhancing lesions within the pancreas.No additional pseudocyst formation outside of the pancreas. Spleen: Normal spleen Adrenals/urinary tract: Adrenal glands and kidneys are normal. Stomach/Bowel: Stomach and limited of the small bowel is unremarkable Vascular/Lymphatic: Abdominal aortic normal caliber. No retroperitoneal periportal lymphadenopathy. 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 Venous vasculature is somewhat difficult evaluate due to motion degradation. Vascular occlusions better depicted on comparison CT. Musculoskeletal: No aggressive osseous lesion IMPRESSION: 1. 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 The cysts either compresses or communicates with the pancreatic duct as the duct is dilated upstream of this cystic complex. Cystic complex not significant changed from CT 05/15/2018. 2. Head of the pancreas remains edematous consistent with chronic pancreatitis. No additional organized fluid collections identified. 3. No cholelithiasis. Common bile duct normal caliber. No intrahepatic duct dilatation. No enhancing hepatic lesion. No hepatic steatosis. 4. 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 Electronically Signed   By: Suzy Bouchard M.D.   On: 05/31/2018 16:22   Anti-infectives: Anti-infectives (From admission, onward)   Start     Dose/Rate Route Frequency Ordered Stop   06/14/18 1500  ceFAZolin (ANCEF) IVPB 2g/100 mL premix  Status:  Discontinued     2 g 200 mL/hr over 30 Minutes Intravenous  Once 06/14/18 1449 06/14/18 1556   06/14/18 1308  ceFAZolin (ANCEF) 2-4 GM/100ML-% IVPB    Note to Pharmacy: Despina Arias  : cabinet override      06/14/18 1308 06/14/18 1355      Assessment/Plan: s/p Procedure(s):  Uterine Embolization (N/A) wean iv pain meds, basal rate first, with transition to PO route. Cap iv when tolerating po intake. Mobilize further. Can go home when tolerating  po, adequate pain control established by po route. Restart xarelto as outpatient; hgb up from 6.6 to 9.5 last night, now equilibrating to 8.6. cessation of IVF should help, curbing dilution. Follow up with Dr Lucky Cowboy as outpatient   LOS: 3 days   [place] 06/16/2018, 8:14 AM

## 2018-06-16 NOTE — Progress Notes (Signed)
[place] [facility] [place] NAME: Amy Wolfe    MR#:  [id number]  DATE OF BIRTH:  [birth year]  SUBJECTIVE:  CHIEF COMPLAINT:   Chief Complaint  Patient presents with  . Vaginal Bleeding  . Abdominal Pain   No new complaints this morning.  Patient still does have some minimal vaginal bleeding which is expected.  No fevers.  Transfused with 2 units of packed red blood cells yesterday.  REVIEW OF SYSTEMS:  ROS ROS CONSTITUTIONAL: No fever, fatigue or weakness.  EYES: No blurred or double vision.  EARS, NOSE, AND THROAT: No tinnitus or ear pain.  RESPIRATORY: No cough, shortness of breath, wheezing or hemoptysis.  CARDIOVASCULAR: No chest pain, orthopnea, edema.  GASTROINTESTINAL:  Has abdominal pain, patient has chronic abdominal pain secondary to chronic pancreatitis.  GENITOURINARY: No dysuria, hematuria.  ENDOCRINE: No polyuria, nocturia,  HEMATOLOGY: No anemia, easy bruising or bleeding GU; mild vaginal bleeding intermittently SKIN: No rash or lesion. MUSCULOSKELETAL: No joint pain or arthritis.   NEUROLOGIC: No tingling, numbness, weakness.  PSYCHIATRY: No anxiety or depression.   DRUG ALLERGIES:  No Known Allergies VITALS:  Blood pressure 122/82, pulse 73, temperature 99 F (37.2 C), temperature source Oral, resp. rate 16, height 5\' 7"  (1.702 m), weight 67.3 kg, last menstrual period 05/28/2018, SpO2 100 %. PHYSICAL EXAMINATION:  Physical Exam  GENERAL:  43 y.o.-year-old patient lying in the bed with no acute distress.  EYES: Pupils equal, round, reactive to light and accommodation. No scleral icterus. Extraocular muscles intact.  HEENT: Head atraumatic, normocephalic. Oropharynx and nasopharynx clear.  NECK:  Supple, no jugular venous distention. No thyroid enlargement, no tenderness.  LUNGS: Normal breath sounds bilaterally, no wheezing, rales,rhonchi or crepitation. No use of accessory muscles of respiration.  CARDIOVASCULAR:  S1, S2 normal. No murmurs, rubs, or gallops.  ABDOMEN:  epigastric abdominal tenderness present no rebound tenderness.   EXTREMITIES: No pedal edema, cyanosis, or clubbing.  NEUROLOGIC: Cranial nerves II through XII are intact. Muscle strength 5/5 in all extremities. Sensation intact. Gait not checked.  PSYCHIATRIC: The patient is alert and oriented x 3.  SKIN: No obvious rash, lesion, or ulcer.  LABORATORY PANEL:  Female CBC Recent Labs  Lab 06/16/18 0604  WBC 5.8  HGB 8.6*  HCT 26.9*  PLT 220   ------------------------------------------------------------------------------------------------------------------ Chemistries  Recent Labs  Lab 06/13/18 1546  06/16/18 0604  NA 134*   < > 137  K 3.7   < > 3.3*  CL 104   < > 104  CO2 24   < > 25  GLUCOSE 130*   < > 107*  BUN 10   < > 7  CREATININE 0.48   < > 0.48  CALCIUM 8.6*   < > 8.2*  MG  --    < > 1.8  AST 14*  --   --   ALT 10  --   --   ALKPHOS 96  --   --   BILITOT 0.2*  --   --    < > = values in this interval not displayed.   RADIOLOGY:  No results found. ASSESSMENT AND PLAN:   43 year old female patient with history of chronic pancreatitis, recently diagnosed portal vein thrombosis on Xarelto comes in because of symptomatic anemia.  Patient has been having vaginal bleeding for a long time but did not seek any medical attention and has no PCP.  1.Generalized weakness, fatigue, dizziness secondary to symptomatic anemia Patient status post  5 units of packed red blood cell transfusion during this admission.  Hemoglobin stable at 8.6 this morning. Both symptoms resolved.   2. Acute blood loss anemia secondary to uterine fibroids,  Hemoglobin stable at 8.6 this morning following total of 5 units of packed red blood cell transfusions during this admission.  Follow-up on CBC in a.m.    3.  Uterine fibroids Patient seen by vascular surgery.  Status post bilateral uterine artery embolization.  Likely continue to have some  sloughing and some bleeding per vascular surgeon given the fact that not just the bleeding areas have been taking care of but there will be a surrounding penumbra of nonviable tissue from the embolizations.  This is unavoidable. Patient also seen by gynecologist.  Patient to follow-up with OB/GYN physician post discharge from the hospital.  4.  Chronic pancreatitis, Recently diagnosed portal vein thrombosis, started on Xarelto a month ago.  Anticoagulation was held on admission due to anemia requiring packed red blood cell transfusions.  Vascular surgeon Dr. Shelia Media has given clearance to resume anticoagulation due to recent diagnosis of portal vein thrombosis as long as hemoglobin remained stable. Appropriate dosing of Xarelto to be resumed by pharmacist today.  5.  Vaginal bleeding with acute blood loss anemia, Patient received Kcentra on admission.  Vagina bleeding significantly improved status post bilateral uterine artery embolization.  DVT prophylaxis; SCDs  All the records are reviewed and case discussed with Care Management/Social Worker. Management plans discussed with the patient, family and they are in agreement.  CODE STATUS: Full Code  TOTAL TIME TAKING CARE OF THIS PATIENT: 36 minutes.   More than 50% of the time was spent in counseling/coordination of care: YES  POSSIBLE D/C IN 1-2 DAYS, DEPENDING ON CLINICAL CONDITION.   Mikhail Hallenbeck M.D on 06/16/2018 at 12:39 PM  Between 7am to 6pm - Pager - [phone]  After 6pm go to [email or web address] - Proofreader  [facility]  Office  [phone]  CC: Primary care physician; Patient, [name]  Note: This dictation was prepared with Dragon dictation along with smaller phrase technology. Any transcriptional errors that result from this process are unintentional.

## 2018-06-17 ENCOUNTER — Encounter: Payer: Self-pay | Admitting: Vascular Surgery

## 2018-06-17 LAB — BASIC METABOLIC PANEL
Anion gap: 7 (ref 5–15)
BUN: 8 mg/dL (ref 6–20)
CO2: 27 mmol/L (ref 22–32)
Calcium: 8.1 mg/dL — ABNORMAL LOW (ref 8.9–10.3)
Chloride: 105 mmol/L (ref 98–111)
Creatinine, Ser: 0.62 mg/dL (ref 0.44–1.00)
GFR calc Af Amer: >60 mL/min (ref >60)
GFR calc non Af Amer: >60 mL/min (ref >60)
Glucose, Bld: 108 mg/dL — ABNORMAL HIGH (ref 70–99)
Potassium: 3.5 mmol/L (ref 3.5–5.1)
Sodium: 139 mmol/L (ref 135–145)

## 2018-06-17 LAB — CBC
HCT: 28.4 % — ABNORMAL LOW (ref 36.0–46.0)
Hemoglobin: 8.9 g/dL — ABNORMAL LOW (ref 12.0–15.0)
MCH: 24.9 pg — ABNORMAL LOW (ref 26.0–34.0)
MCHC: 31.3 g/dL (ref 30.0–36.0)
MCV: 79.6 fL — ABNORMAL LOW (ref 80.0–100.0)
Platelets: 220 10*3/uL (ref 150–400)
RBC: 3.57 MIL/uL — ABNORMAL LOW (ref 3.87–5.11)
RDW: 21.3 % — ABNORMAL HIGH (ref 11.5–15.5)
WBC: 6.6 10*3/uL (ref 4.0–10.5)
nRBC: 0 % (ref 0.0–0.2)

## 2018-06-17 MED ORDER — OXYCODONE-ACETAMINOPHEN 5-325 MG PO TABS: 1.0000 | ORAL_TABLET | Freq: Four times a day (QID) | ORAL | 0 refills | Status: DC | PRN

## 2018-06-17 MED ORDER — HYDRALAZINE HCL 20 MG/ML IJ SOLN
10.0000 mg | Freq: Once | INTRAMUSCULAR | Status: DC
  Filled 2018-06-17: qty 1

## 2018-06-17 MED ORDER — MORPHINE SULFATE (PF) 2 MG/ML IV SOLN
2.0000 mg | Freq: Once | INTRAVENOUS | Status: AC
  Administered 2018-06-17: 2 mg via INTRAVENOUS
  Filled 2018-06-17: qty 1

## 2018-06-17 MED ORDER — AMLODIPINE BESYLATE 10 MG PO TABS: 10.0000 mg | ORAL_TABLET | Freq: Every day | ORAL | 0 refills | Status: DC

## 2018-06-17 MED ORDER — AMLODIPINE BESYLATE 5 MG PO TABS
5.0000 mg | ORAL_TABLET | Freq: Every day | ORAL | Status: DC
  Administered 2018-06-17: 5 mg via ORAL
  Filled 2018-06-17: qty 1

## 2018-06-17 MED ORDER — RIVAROXABAN 20 MG PO TABS: 20.0000 mg | ORAL_TABLET | Freq: Every day | ORAL | 0 refills | Status: DC

## 2018-06-17 NOTE — Progress Notes (Signed)
Amy Wolfe to be D/C'd home per MD order.  Discussed prescriptions and follow up appointments with the patient. Prescriptions given to patient, medication list explained in detail. Pt verbalized understanding.  Allergies as of 06/17/2018   No Known Allergies     Medication List    TAKE these medications   amLODipine 10 MG tablet Commonly known as: NORVASC Take 1 tablet (10 mg total) by mouth daily. What changed:   medication strength  how much to take   oxyCODONE-acetaminophen 5-325 MG tablet Commonly known as: PERCOCET/ROXICET Take 1 tablet by mouth every 6 (six) hours as needed for moderate pain or severe pain.   Pancreaze 10500 units Cpep Generic drug: Pancrelipase (Lip-Prot-Amyl) Take by mouth 3 (three) times daily before meals.   pantoprazole 40 MG tablet Commonly known as: PROTONIX Take 40 mg by mouth daily.   rivaroxaban 20 MG Tabs tablet Commonly known as: XARELTO Take 1 tablet (20 mg total) by mouth daily with supper. What changed:   medication strength  See the new instructions.       Vitals:   06/17/18 1425 06/17/18 1535  BP: (!) 160/92 (!) 153/96  Pulse: 70 67  Resp:    Temp:    SpO2:      Skin clean, dry and intact without evidence of skin break down, no evidence of skin tears noted. IV catheter discontinued intact. Site without signs and symptoms of complications. Dressing and pressure applied. Pt denies pain at this time. No complaints noted.  An After Visit Summary was printed and given to the patient. Patient escorted via [place], and D/C home via private auto.  Chuck Hint RN [facility] [employer]

## 2018-06-17 NOTE — Discharge Summary (Signed)
[facility] [place] NAME: Amy Wolfe    MR#:  [id number]  DATE OF BIRTH:  [birth year]  DATE OF ADMISSION:  06/13/2018   ADMITTING PHYSICIAN: Gorden Harms, MD  DATE OF DISCHARGE: 06/17/2018  PRIMARY CARE PHYSICIAN: Patient, [name]   ADMISSION DIAGNOSIS:  Abd pain vaginal bleeding DISCHARGE DIAGNOSIS:  Active Problems:   Symptomatic anemia  SECONDARY DIAGNOSIS:   Past Medical History:  Diagnosis Date   Coronary artery disease    H/O blood clots    Hypertension    Liver abscess    Pancreatitis    Thyroid disease    Uterine fibroid    HOSPITAL COURSE:  Chief complaint; vaginal bleeding and abdominal pains  History of presenting complaint; HISTORY OF PRESENT ILLNESS: Amy Wolfe  is a 43 y.o. female with a known history per below which includes portal vein thrombosis diagnosed roughly 1 month ago- on Xarelto, known history of uterine fibroids, presenting with recurrent abdominal pain, generalized  weakness, fatigue, lightheadedness, dark multiple episodes of vaginal bleeding with clots, has had to change pads numerous times each day over the last 1 to 2 weeks, in the emergency room patient was tachycardic, hypotensive, hemoglobin 4.5 down from 8.9, recently seen by Dr. dew/vascular surgery, ED attending did discuss with vascular surgery-plans for bilateral uterine artery embolization.patient was admitted to medical service.  Please refer to H&P for further details   Hospital course; 1.Generalized weakness, fatigue, dizziness secondary to symptomatic anemia Patient status post 5 units of packed red blood cell transfusion during this admission.  Hemoglobin stable at 8.9 this morning. Both symptoms resolved.   2. Acute blood loss anemia secondary to uterine fibroids,  Hemoglobin stable at 8.9 this morning following total of 5 units of packed red blood cell transfusions during this admission.   3. Uterine  fibroids Patient seen by vascular surgery.  Status post bilateral uterine artery embolization.  Likely continue to have some sloughing and some bleeding per vascular surgeon given the fact that not just the bleeding areas have been taking care of but there will be a surrounding penumbra of nonviable tissue from the embolizations. This is unavoidable. Patient also seen by gynecologist.  Patient to follow-up with OB/GYN physician post discharge from the hospital.  Appointment made prior to discharge  4. Chronic pancreatitis, Recently diagnosed portal vein thrombosis, started on Xarelto a month ago.  Anticoagulation was held on admission due to anemia requiring packed red blood cell transfusions.  Vascular surgeon Dr. Shelia Media has given clearance to resume anticoagulation due to recent diagnosis of portal vein thrombosis as long as hemoglobin remained stable. Appropriate dosing of Xarelto resumed yesterday.  Hemoglobin actually improved further to 8.9 today . Patient with chronic abdominal pains from underlying chronic pancreatitis.  Continue outpatient regimen on discharge.  5. Vaginal bleeding with acute blood loss anemia, Patient received Kcentra on admission.  Vagina bleeding significantly improved status post bilateral uterine artery embolization.  6.  Hypertension Uncontrolled.  Increased dose of Norvasc from 5 mg to 10 mg p.o. daily.  Outpatient monitoring by primary care physician  DISCHARGE CONDITIONS:  Stable CONSULTS OBTAINED:  Treatment Team:  Shelda Altes, MD DRUG ALLERGIES:  No Known Allergies DISCHARGE MEDICATIONS:   Allergies as of 06/17/2018   No Known Allergies     Medication List    TAKE these medications   amLODipine 5 MG tablet Commonly known as: NORVASC Take 5 mg by mouth daily.   oxyCODONE-acetaminophen  5-325 MG tablet Commonly known as: PERCOCET/ROXICET Take 1 tablet by mouth every 6 (six) hours as needed for moderate pain or severe pain.   Pancreaze  10500 units Cpep Generic drug: Pancrelipase (Lip-Prot-Amyl) Take by mouth 3 (three) times daily before meals.   pantoprazole 40 MG tablet Commonly known as: PROTONIX Take 40 mg by mouth daily.   rivaroxaban 20 MG Tabs tablet Commonly known as: XARELTO Take 1 tablet (20 mg total) by mouth daily with supper. What changed:  medication strength See the new instructions.        DISCHARGE INSTRUCTIONS:   DIET:  Cardiac diet DISCHARGE CONDITION:  Stable ACTIVITY:  Activity as tolerated OXYGEN:  Home Oxygen: No.  Oxygen Delivery: room air DISCHARGE LOCATION:  home   If you experience worsening of your admission symptoms, develop shortness of breath, life threatening emergency, suicidal or homicidal thoughts you must seek medical attention immediately by calling 911 or calling your MD immediately  if symptoms less severe.  You Must read complete instructions/literature along with all the possible adverse reactions/side effects for all the Medicines you take and that have been prescribed to you. Take any new Medicines after you have completely understood and accpet all the possible adverse reactions/side effects.   Please note  You were cared for by a hospitalist during your hospital stay. If you have any questions about your discharge medications or the care you received while you were in the hospital after you are discharged, you can call the unit and asked to speak with the hospitalist on call if the hospitalist that took care of you is not available. Once you are discharged, your primary care physician will handle any further medical issues. Please note that NO REFILLS for any discharge medications will be authorized once you are discharged, as it is imperative that you return to your primary care physician (or establish a relationship with a primary care physician if you do not have one) for your aftercare needs so that they can reassess your need for medications and monitor your  lab values.    On the day of Discharge:  VITAL SIGNS:  Blood pressure (!) 142/81, pulse 71, temperature 98.2 F (36.8 C), temperature source Oral, resp. rate 16, height 5\' 7"  (1.702 m), weight 67.3 kg, last menstrual period 05/28/2018, SpO2 100 %. PHYSICAL EXAMINATION:  GENERAL:  43 y.o.-year-old patient lying in the bed with no acute distress.  EYES: Pupils equal, round, reactive to light and accommodation. No scleral icterus. Extraocular muscles intact.  HEENT: Head atraumatic, normocephalic. Oropharynx and nasopharynx clear.  NECK:  Supple, no jugular venous distention. No thyroid enlargement, no tenderness.  LUNGS: Normal breath sounds bilaterally, no wheezing, rales,rhonchi or crepitation. No use of accessory muscles of respiration.  CARDIOVASCULAR: S1, S2 normal. No murmurs, rubs, or gallops.  ABDOMEN: Soft, non-tender, non-distended. Bowel sounds present. No organomegaly or mass.  EXTREMITIES: No pedal edema, cyanosis, or clubbing.  NEUROLOGIC: Cranial nerves II through XII are intact. Muscle strength 5/5 in all extremities. Sensation intact. Gait not checked.  PSYCHIATRIC: The patient is alert and oriented x 3.  SKIN: No obvious rash, lesion, or ulcer.  DATA REVIEW:   CBC Recent Labs  Lab 06/17/18 0435  WBC 6.6  HGB 8.9*  HCT 28.4*  PLT 220    Chemistries  Recent Labs  Lab 06/13/18 1546  06/16/18 0604 06/17/18 0435  NA 134*   < > 137 139  K 3.7   < > 3.3* 3.5  CL 104   < >  104 105  CO2 24   < > 25 27  GLUCOSE 130*   < > 107* 108*  BUN 10   < > 7 8  CREATININE 0.48   < > 0.48 0.62  CALCIUM 8.6*   < > 8.2* 8.1*  MG  --    < > 1.8  --   AST 14*  --   --   --   ALT 10  --   --   --   ALKPHOS 96  --   --   --   BILITOT 0.2*  --   --   --    < > = values in this interval not displayed.     Microbiology Results  Results for orders placed or performed during the hospital encounter of 06/13/18  SARS Coronavirus 2 (CEPHEID- Performed in [facility] hospital  lab), Hosp Order     Status: None   Collection Time: 06/13/18  4:33 PM   Specimen: Nasopharyngeal Swab  Result Value Ref Range Status   SARS Coronavirus 2 NEGATIVE NEGATIVE Final    Comment: (NOTE) If result is NEGATIVE SARS-CoV-2 target nucleic acids are NOT DETECTED. The SARS-CoV-2 RNA is generally detectable in upper and lower  respiratory specimens during the acute phase of infection. The lowest  concentration of SARS-CoV-2 viral copies this assay can detect is 250  copies / mL. A negative result does not preclude SARS-CoV-2 infection  and should not be used as the sole basis for treatment or other  patient management decisions.  A negative result may occur with  improper specimen collection / handling, submission of specimen other  than nasopharyngeal swab, presence of viral mutation(s) within the  areas targeted by this assay, and inadequate number of viral copies  (<250 copies / mL). A negative result must be combined with clinical  observations, patient history, and epidemiological information. If result is POSITIVE SARS-CoV-2 target nucleic acids are DETECTED. The SARS-CoV-2 RNA is generally detectable in upper and lower  respiratory specimens dur ing the acute phase of infection.  Positive  results are indicative of active infection with SARS-CoV-2.  Clinical  correlation with patient history and other diagnostic information is  necessary to determine patient infection status.  Positive results do  not rule out bacterial infection or co-infection with other viruses. If result is PRESUMPTIVE POSTIVE SARS-CoV-2 nucleic acids MAY BE PRESENT.   A presumptive positive result was obtained on the submitted specimen  and confirmed on repeat testing.  While 2019 novel coronavirus  (SARS-CoV-2) nucleic acids may be present in the submitted sample  additional confirmatory testing may be necessary for epidemiological  and / or clinical management purposes  to differentiate between   SARS-CoV-2 and other Sarbecovirus currently known to infect humans.  If clinically indicated additional testing with an alternate test  methodology [phone]) is advised. The SARS-CoV-2 RNA is generally  detectable in upper and lower respiratory sp ecimens during the acute  phase of infection. The expected result is Negative. Fact Sheet for Patients:  StrictlyIdeas.no Fact Sheet for Healthcare Providers: BankingDealers.co.za This test is not yet approved or cleared by the [place] FDA and has been authorized for detection and/or diagnosis of SARS-CoV-2 by FDA under an Emergency Use Authorization (EUA).  This EUA will remain in effect (meaning this test can be used) for the duration of the COVID-19 declaration under Section 564(b)(1) of the Act, 21 U.S.C. section 360bbb-3(b)(1), unless the authorization is terminated or revoked sooner. Performed at [facility], [place], [place] [address]     RADIOLOGY:  No results found.   Management plans discussed with the patient, family and they are in agreement.  CODE STATUS: Full Code   TOTAL TIME TAKING CARE OF THIS PATIENT: 37 minutes.    Wylee Dorantes M.D on 06/17/2018 at 10:58 AM  Between 7am to 6pm - Pager - [phone]  After 6pm go to [email or web address] - Proofreader  [facility]  Office  [phone]  CC: Primary care physician; Patient, [name]   Note: This dictation was prepared with Dragon dictation along with smaller phrase technology. Any transcriptional errors that result from this process are unintentional.

## 2018-06-19 NOTE — Telephone Encounter (Signed)
Left vm for pt to return my call.  

## 2018-06-24 MED FILL — Morphine Sulfate Inj 2 MG/ML: INTRAVENOUS | Qty: 30 | Status: AC

## 2018-06-25 ENCOUNTER — Telehealth: Payer: Self-pay

## 2018-06-25 NOTE — Telephone Encounter (Signed)
LMTRC for prescreening.  

## 2018-06-25 NOTE — Progress Notes (Signed)
Pt is present today from a ED follow up for vaginal bleeding, abd pain and anemia.  Pt stated that her cycle started and is still on. Pt stated having severe abd pain x several months.

## 2018-06-26 ENCOUNTER — Encounter: Payer: Self-pay | Admitting: Obstetrics and Gynecology

## 2018-06-26 ENCOUNTER — Other Ambulatory Visit: Payer: Self-pay

## 2018-06-26 ENCOUNTER — Ambulatory Visit (INDEPENDENT_AMBULATORY_CARE_PROVIDER_SITE_OTHER): Payer: Medicaid Other | Admitting: Obstetrics and Gynecology

## 2018-06-26 VITALS — BP 120/86 | HR 90 | Ht 67.0 in | Wt 146.5 lb

## 2018-06-26 DIAGNOSIS — D259 Leiomyoma of uterus, unspecified: Secondary | ICD-10-CM

## 2018-06-26 DIAGNOSIS — D500 Iron deficiency anemia secondary to blood loss (chronic): Secondary | ICD-10-CM | POA: Diagnosis not present

## 2018-06-26 DIAGNOSIS — Z7689 Persons encountering health services in other specified circumstances: Secondary | ICD-10-CM | POA: Diagnosis not present

## 2018-06-26 DIAGNOSIS — Z9889 Other specified postprocedural states: Secondary | ICD-10-CM

## 2018-06-26 DIAGNOSIS — Z7901 Long term (current) use of anticoagulants: Secondary | ICD-10-CM

## 2018-06-26 MED ORDER — DOCUSATE SODIUM 100 MG PO CAPS: 100.0000 mg | ORAL_CAPSULE | Freq: Two times a day (BID) | ORAL | 2 refills | Status: DC | PRN

## 2018-06-26 MED ORDER — FERROUS SULFATE 325 (65 FE) MG PO TABS: 325.0000 mg | ORAL_TABLET | Freq: Two times a day (BID) | ORAL | 1 refills | Status: DC

## 2018-06-26 MED ORDER — PANTOPRAZOLE SODIUM 40 MG PO TBEC: 40.0000 mg | DELAYED_RELEASE_TABLET | Freq: Every day | ORAL | 6 refills | Status: DC

## 2018-06-26 MED ORDER — ACETAMINOPHEN-CODEINE #3 300-30 MG PO TABS: 1.0000 | ORAL_TABLET | ORAL | 1 refills | Status: DC | PRN

## 2018-06-26 NOTE — Progress Notes (Signed)
GYNECOLOGY PROGRESS NOTE  Subjective:    Patient ID: Amy Wolfe, female    DOB: 05-01-1975, 43 y.o.   MRN: [id number]  HPI  Patient is a 43 y.o. [id number] female who presents ~ 2 weeks s/p hospital admission for chronic pancreatitis, portal vein thrombosis, fibroid uterus with abnormal uterine bleeding and severe anemia (Hgb of 4.5). Patient underwent a uterine artery embolization procedure and received a blood transfusion during that admission. She presents for follow up today after procedure.  Is currently on anticoagulant therapy.    Patient notes that she is still having some pain after the procedure, and is also still experiencing some bleeding (however much less than before). Has run out of her pain medications.    GYN History:  Patient's last menstrual period was 05/28/2018. Menarche age: 10 Last pap smear: ~ 2 months ago, performed in [place] ([employer]), per pt Contraception: Condoms (occasionally) History STI's: denies  The following portions of the patient's history were reviewed and updated as appropriate:   She  has a past medical history of Coronary artery disease, H/O blood clots, Hypertension, Liver abscess, Pancreatitis, Thyroid disease, and Uterine fibroid.   She  has a past surgical history that includes Thyroidectomy, partial; Cesarean section; and EMBOLIZATION (N/A, 06/14/2018).   Her family history includes Cancer in her father, paternal grandfather, and paternal grandmother; Diabetes in her mother; Hypertension in her mother.   She  reports that she has been smoking cigarettes. She has never used smokeless tobacco. She reports previous alcohol use. She reports previous drug use. Drug: Marijuana.   She has a current medication list which includes the following prescription(s): amlodipine, oxycodone-acetaminophen, pancrelipase (lip-prot-amyl), rivaroxaban, acetaminophen-codeine, docusate sodium, ferrous sulfate, and pantoprazole. She has No Known  Allergies..   Review of Systems Pertinent items noted in HPI and remainder of comprehensive ROS otherwise negative.   Objective:   Blood pressure 120/86, pulse 90, height 5\' 7"  (1.702 m), weight 146 lb 8 oz (66.5 kg), last menstrual period 05/28/2018. General appearance: alert and no distress Abdomen: soft, non-tender; bowel sounds normal; no masses,  no organomegaly Pelvic: external genitalia normal, rectovaginal septum normal.  Vagina with scant dark red blood in vault.  Cervix normal appearing, no lesions and no motion tenderness.  Uterus mobile, nontender, normal shape and size.  Adnexae non-palpable, nontender bilaterally.  Extremities: extremities normal, atraumatic, no cyanosis or edema Neurologic: Grossly normal    Labs:  Lab Results  Component Value Date   WBC 6.6 06/17/2018   HGB 8.9 (L) 06/17/2018   HCT 28.4 (L) 06/17/2018   MCV 79.6 (L) 06/17/2018   PLT 220 06/17/2018    Imaging;  CLINICAL DATA:  Right upper quadrant and a bili cul pain for 3 days. Elevated lipase. History of pancreatitis.  EXAM: CT ABDOMEN AND PELVIS WITH CONTRAST  TECHNIQUE: Multidetector CT imaging of the abdomen and pelvis was performed using the standard protocol following bolus administration of intravenous contrast.  CONTRAST:  135mL OMNIPAQUE IOHEXOL 300 MG/ML  SOLN  COMPARISON:  None.  FINDINGS: Lower chest: Dependent lower lobe atelectasis bilaterally with tiny right pleural effusion.  Hepatobiliary: Heterogeneous liver perfusion. 15 mm low-density lesion in the medial right liver posterior to the IVC cannot be definitively characterized. Gallbladder wall has an irregular appearance and appears nodular in some regions. No intrahepatic or extrahepatic biliary dilation.  Pancreas: Pancreatic parenchyma is ill-defined in the head and body of pancreas with some dilatation of the main duct in the body. 2.7 x 2.3 cm  low-density lesion in the body of pancreas has  attenuation too high to be a simple cyst. Adjacent low-density lesions in the uncinate process measure approximately 17 mm each. There is peripancreatic edema.  Spleen: No splenomegaly. No focal mass lesion.  Adrenals/Urinary Tract: No adrenal nodule or mass. Kidneys unremarkable. No evidence for hydroureter. The urinary bladder appears normal for the degree of distention.  Stomach/Bowel: Stomach is unremarkable. No gastric wall thickening. No evidence of outlet obstruction. Duodenum is normally positioned as is the ligament of Treitz. No small bowel wall thickening. No small bowel dilatation. The terminal ileum is normal. The appendix is normal. No gross colonic mass. No colonic wall thickening.  Vascular/Lymphatic: No abdominal aortic aneurysm. Nonocclusive thrombus in the main portal vein extends into the right portal vein where it appears occlusive. Nonocclusive thrombus identified in the superior mesenteric vein (37/2). Splenic vein appears occluded. There is no gastrohepatic or hepatoduodenal ligament lymphadenopathy. No intraperitoneal or retroperitoneal lymphadenopathy. No pelvic sidewall lymphadenopathy.  Reproductive: Large fibroid burden noted in the uterus with multiple exophytic and pedunculated fibroids evident. There is no adnexal mass.  Other: Moderate volume ascites noted in the abdomen and pelvis. There is mesenteric edema.  Musculoskeletal: Diffuse body wall edema evident. No worrisome lytic or sclerotic osseous abnormality.  IMPRESSION: 1. Pancreatic head and body appear enlarged and edematous suggesting pancreatitis. There is probably some parenchymal atrophy in the tail of pancreas with mild ductal dilatation in the body of pancreas. 2.7 cm low-density lesion in the body of pancreas associated with adjacent 17 mm low-density lesions in the uncinate process. These are likely pseudocysts, but follow-up recommended as neoplasm not excluded. 2.  Nonocclusive thrombus in the portal vein extends into the portal vein branch where it becomes occlusive. Nonocclusive thrombus is identified in the superior mesenteric vein in the splenic vein appears occluded. 3. Moderate volume ascites. 4. Gallbladder wall is irregular with areas of apparent nodularity. This is presumably secondary. 5. Diffuse body wall edema.   Electronically Signed   By: Misty Stanley M.D.   On: 05/15/2018 11:14   Assessment:   Uterine leiomyoma, unspecified location  Status post embolization of uterine artery  Iron deficiency anemia due to chronic blood loss Encounter to establish care with new doctor  Anticoagulant therapy  Plan:   1. History of uterine fibroids and anemia requiring blood transfusion - patient s/p recent uterine artery embolization.  Still with some residual symptoms. Discussed that pain and bleeding can occur for several weeks after procedure. The fact that she is on anticoagulation therapy can cause longer episodes of bleeding. Will give prescription for T#3 for continued pain (previously on Percocet) as patient notes pain has lessened some. Cannot use NSAIDs due to use of anticoagulation therapy right now and increased risk of bleeding.   Discussed expectations for recovery after procedure.  2.Iron deficiency anemia, advised on initiation of iron supplementation orally. If patient does not respond to oral treatment, may require referral to Hematology. Will recheck levels in 3 months. Prescribed Colace for possible constipation.  3. Will f/u in 3 months to reassess uterine fibroid size after procedure.     A total of 30 minutes were spent face-to-face with the patient during this encounter and over half of that time involved counseling and coordination of care.    Rubie Maid, MD [facility]

## 2018-06-26 NOTE — Patient Instructions (Signed)
Uterine Artery Embolization for Fibroids, Care After  This sheet gives you information about how to care for yourself after your procedure. Your health care provider may also give you more specific instructions. If you have problems or questions, contact your health care provider.  What can I expect after the procedure?  After your procedure, it is common to have:  Pelvic cramping. You will be given pain medicine.  Nausea and vomiting. You may be given medicine to help relieve nausea.  Follow these instructions at home:  Incision care  Follow instructions from your health care provider about how to take care of your incision. Make sure you:  Wash your hands with soap and water before you change your bandage (dressing). If soap and water are not available, use hand sanitizer.  Change your dressing as told by your health care provider.  Check your incision area every day for signs of infection. Check for:  More redness, swelling, or pain.  More fluid or blood.  Warmth.  Pus or a bad smell.  Medicines    Take over-the-counter and prescription medicines only as told by your health care provider.  Do not take aspirin. It can cause bleeding.  Do not drive for 24 hours if you were given a medicine to help you relax (sedative).  Do not drive or use heavy machinery while taking prescription pain medicine.  General instructions  Ask your health care provider when you can resume sexual activity.  To prevent or treat constipation while you are taking prescription pain medicine, your health care provider may recommend that you:  Drink enough fluid to keep your urine clear or pale yellow.  Take over-the-counter or prescription medicines.  Eat foods that are high in fiber, such as fresh fruits and vegetables, whole grains, and beans.  Limit foods that are high in fat and processed sugars, such as fried and sweet foods.  Contact a health care provider if:  You have a fever.  You have more redness, swelling, or pain around your  incision site.  You have more fluid or blood coming from your incision site.  Your incision feels warm to the touch.  You have pus or a bad smell coming from your incision.  You have a rash.  You have uncontrolled nausea or you cannot eat or drink anything without vomiting.  Get help right away if:  You have trouble breathing.  You have chest pain.  You have severe abdominal pain.  You have leg pain.  You become dizzy and faint.  Summary  After your procedure, it is common to have pelvic cramping. You will be given pain medicine.  Follow instructions from your health care provider about how to take care of your incision.  Check your incision area every day for signs of infection.  Take over-the-counter and prescription medicines only as told by your health care provider.  This information is not intended to replace advice given to you by your health care provider. Make sure you discuss any questions you have with your health care provider.  Document Released: 10/09/2012 Document Revised: 03/23/2016 Document Reviewed: 03/23/2016  Elsevier Interactive Patient Education  2019 [employer] Inc[employer]

## 2018-08-01 ENCOUNTER — Telehealth: Payer: Self-pay | Admitting: Obstetrics and Gynecology

## 2018-08-01 NOTE — Telephone Encounter (Signed)
Patient called stating that the tylenol 3 is making her sick. She is requesting a script for tramadol instead sent to [facility] on [address]. Thanks

## 2018-08-01 NOTE — Telephone Encounter (Signed)
Spoke with pt again about more information concerning her pain. Pt stated that the pain has been increasing over time and she has just been dealing with the pain but it has got to the point that she can not take it anymore. Pt is requesting to follow up with [facility] not [facility] at her next appointment in Sept.

## 2018-08-01 NOTE — Telephone Encounter (Signed)
Pt was called and stated that she has tried to take the tylenol 3 and it is making her sick. Pt is requesting tramodol for the pain that she is having. Pt is aware that a message will be sent to [facility] to prescribe the medication. Pt voiced that she understood.

## 2018-08-01 NOTE — Telephone Encounter (Signed)
Please advise. Thanks Angelina Venard 

## 2018-08-02 ENCOUNTER — Other Ambulatory Visit: Payer: Self-pay

## 2018-08-02 ENCOUNTER — Telehealth: Payer: Self-pay | Admitting: Gastroenterology

## 2018-08-02 MED ORDER — PANCRELIPASE (LIP-PROT-AMYL) 36000-114000 UNITS PO CPEP: ORAL_CAPSULE | ORAL | 3 refills | Status: DC

## 2018-08-02 NOTE — Telephone Encounter (Signed)
Pt  Is calling she needs rx Creon called in to [facility] [address] she is completely out and can not eat without it please call pt once rx  called in

## 2018-08-02 NOTE — Telephone Encounter (Signed)
Rx for Creon has been sent to pt's pharmacy. Pt notified.

## 2018-08-05 NOTE — Telephone Encounter (Signed)
See if we can get her in sometime this week.

## 2018-08-07 ENCOUNTER — Telehealth: Payer: Self-pay | Admitting: Obstetrics and Gynecology

## 2018-08-07 NOTE — Telephone Encounter (Signed)
Spoke with pt and assisted with making an appointment to be seen by [facility] on 08/15/18 at 8:45am.

## 2018-08-07 NOTE — Telephone Encounter (Signed)
The patient called and stated that she needs a call back in regards to her pain medication. Pt stated she never received a call back. Please advise.

## 2018-08-07 NOTE — Telephone Encounter (Signed)
Please see another phone encounter.  

## 2018-08-15 ENCOUNTER — Other Ambulatory Visit: Payer: Self-pay

## 2018-08-15 ENCOUNTER — Ambulatory Visit (INDEPENDENT_AMBULATORY_CARE_PROVIDER_SITE_OTHER): Payer: Medicaid Other | Admitting: Obstetrics and Gynecology

## 2018-08-15 ENCOUNTER — Encounter: Payer: Self-pay | Admitting: Obstetrics and Gynecology

## 2018-08-15 VITALS — BP 120/82 | HR 94 | Ht 67.0 in | Wt 151.0 lb

## 2018-08-15 DIAGNOSIS — D251 Intramural leiomyoma of uterus: Secondary | ICD-10-CM

## 2018-08-15 DIAGNOSIS — I10 Essential (primary) hypertension: Secondary | ICD-10-CM | POA: Diagnosis not present

## 2018-08-15 DIAGNOSIS — N938 Other specified abnormal uterine and vaginal bleeding: Secondary | ICD-10-CM

## 2018-08-15 DIAGNOSIS — R102 Pelvic and perineal pain: Secondary | ICD-10-CM

## 2018-08-15 DIAGNOSIS — Z9889 Other specified postprocedural states: Secondary | ICD-10-CM | POA: Diagnosis not present

## 2018-08-15 DIAGNOSIS — D250 Submucous leiomyoma of uterus: Secondary | ICD-10-CM

## 2018-08-15 MED ORDER — AMLODIPINE BESYLATE 10 MG PO TABS: 10.0000 mg | ORAL_TABLET | Freq: Every day | ORAL | 0 refills | Status: DC

## 2018-08-15 MED ORDER — TRAMADOL HCL 50 MG PO TABS: 100.0000 mg | ORAL_TABLET | Freq: Four times a day (QID) | ORAL | 1 refills | Status: DC | PRN

## 2018-08-15 NOTE — Progress Notes (Signed)
GYNECOLOGY PROGRESS NOTE  Subjective:    Patient ID: Amy Wolfe, female    DOB: [birth year], 43 y.o.   MRN: [id number]  HPI  Patient is a 43 y.o. [id number] female who presents for complaints of worsening pelvic pain s/p uterine fibroid embolization 2 months ago.  She notes that the pain initially had resolved after ~ 3 weeks s/p surgery, however this past Friday, she noted her cycle started and that her pain just becoming increasingly worse.  Still had some T#3 leftover from her post-operative period but she notes that this was not helping.  Reports that she can't deal with the pain as she has had to leave work several times and needs the money. Overall she has has been bleeding since her surgery, with only 1 week of no bleeding. Denies fevers, chills, nausea, vomiting. Denies passage of any tissue or vaginal discharge.   Additionally, patient requests refill of her HTN medication.  Notes that she has an appointment to establish with a new PCP next month (relocated from [place] several months ago), however only has a few pills left. Requests refill.   The following portions of the patient's history were reviewed and updated as appropriate: allergies, current medications, past family history, past medical history, past social history, past surgical history and problem list.  Review of Systems Pertinent items noted in HPI and remainder of comprehensive ROS otherwise negative.   Objective:   Blood pressure 120/82, pulse 94, height 5\' 7"  (1.702 m), weight 151 lb (68.5 kg), last menstrual period 08/12/2018. General appearance: alert and no distress Abdomen: normal findings: bowel sounds normal, no masses palpable and soft and abnormal findings:  mild to moderate tenderness in the lower abdomen Pelvic: deferred.   Assessment:   Pelvic pain Fibroid uterus S/p uterine fibroid embolization Dysfunctional uterine bleeding Essential HTN  Plan:  1.  Pelvic pain - patient with worsening pelvic pain  since her uterine fibroid embolization procedure.  Notes that Tylenol 3 is not working.  Does report that tramadol did help her in the past.  Is able to take 1 tramadol however symptoms returned after approximately 4 hours.  Will prescribe tramadol, can take up to 2 tablets every 6 hours as needed, also encouraged to alternate with ibuprofen.  We will also order pelvic ultrasound to reassess uterine fibroids, as well as rule out any possible complications such as hematoma, uterine ischemia/necrosis, or abscess formation (although less likely as patient is afebrile).  2.  Dysfunctional uterine bleeding - discussion had been abnormal bleeding can still occur after her procedure, may still need other management options such as hormonal suppression.  Can reassess bleeding at the 41-month postoperative check.  Also discussed the possibility of a failed uterine fibroid embolization procedure.  Discussed possible surgical need for hysterectomy as the point if symptoms continue to persist.  Patient notes understanding, notes that she would be willing and ready to proceed if necessary.  Procedure was not done initially due to patient's severe anemia at presentation to the hospital 2 months ago where she required a blood transfusion (hgb was 4.5 on admission). 3. Essential HTN, desires refill on medication until she establishes care with a PCP. Will refill medication.   We will follow up after ultrasound to determine appropriate management recommendations.   A total of 15 minutes were spent face-to-face with the patient during this encounter and over half of that time dealt with counseling and coordination of care.   Rubie Maid, MD [facility]

## 2018-08-15 NOTE — Progress Notes (Signed)
Pt is present today due to having abd pain with her cycles. Pt stated that her LMP 08/12/18. Pt stated having a lot of cramping, heavy bleeding, clots, and having her cycle for 6 months straight. Pt stated that no medication is helping with the pain. Pt stated that the pain medication prescribed caused her to be nauseated and did not help with the pain.

## 2018-08-15 NOTE — Patient Instructions (Signed)
Pelvic Pain, Female Pelvic pain is pain in your lower belly (abdomen), below your belly button and between your hips. The pain may start suddenly (be acute), keep coming back (be recurring), or last a long time (become chronic). Pelvic pain that lasts longer than 6 months is called chronic pelvic pain. There are many causes of pelvic pain. Sometimes the cause of pelvic pain is not known. Follow these instructions at home:   Take over-the-counter and prescription medicines only as told by your doctor.  Rest as told by your doctor.  Do not have sex if it hurts.  Keep a journal of your pelvic pain. Write down: ? When the pain started. ? Where the pain is located. ? What seems to make the pain better or worse, such as food or your period (menstrual cycle). ? Any symptoms you have along with the pain.  Keep all follow-up visits as told by your doctor. This is important. Contact a doctor if:  Medicine does not help your pain.  Your pain comes back.  You have new symptoms.  You have unusual discharge or bleeding from your vagina.  You have a fever or chills.  You are having trouble pooping (constipation).  You have blood in your pee (urine) or poop (stool).  Your pee smells bad.  You feel weak or light-headed. Get help right away if:  You have sudden pain that is very bad.  Your pain keeps getting worse.  You have very bad pain and also have any of these symptoms: ? A fever. ? Feeling sick to your stomach (nausea). ? Throwing up (vomiting). ? Being very sweaty.  You pass out (lose consciousness). Summary  Pelvic pain is pain in your lower belly (abdomen), below your belly button and between your hips.  There are many possible causes of pelvic pain.  Keep a journal of your pelvic pain. This information is not intended to replace advice given to you by your health care provider. Make sure you discuss any questions you have with your health care provider. Document  Released: 06/07/2007 Document Revised: 06/06/2017 Document Reviewed: 06/06/2017 Elsevier Patient Education  2020 [employer].

## 2018-09-04 ENCOUNTER — Ambulatory Visit (INDEPENDENT_AMBULATORY_CARE_PROVIDER_SITE_OTHER): Payer: Medicaid Other

## 2018-09-04 ENCOUNTER — Encounter: Payer: Self-pay | Admitting: Obstetrics and Gynecology

## 2018-09-04 ENCOUNTER — Other Ambulatory Visit: Payer: Self-pay

## 2018-09-04 ENCOUNTER — Ambulatory Visit (INDEPENDENT_AMBULATORY_CARE_PROVIDER_SITE_OTHER): Payer: Medicaid Other | Admitting: Obstetrics and Gynecology

## 2018-09-04 VITALS — BP 115/75 | HR 80 | Ht 67.0 in | Wt 168.9 lb

## 2018-09-04 DIAGNOSIS — D251 Intramural leiomyoma of uterus: Secondary | ICD-10-CM | POA: Diagnosis not present

## 2018-09-04 DIAGNOSIS — Z9889 Other specified postprocedural states: Secondary | ICD-10-CM

## 2018-09-04 DIAGNOSIS — E039 Hypothyroidism, unspecified: Secondary | ICD-10-CM

## 2018-09-04 DIAGNOSIS — Z7901 Long term (current) use of anticoagulants: Secondary | ICD-10-CM

## 2018-09-04 DIAGNOSIS — N938 Other specified abnormal uterine and vaginal bleeding: Secondary | ICD-10-CM

## 2018-09-04 DIAGNOSIS — D500 Iron deficiency anemia secondary to blood loss (chronic): Secondary | ICD-10-CM

## 2018-09-04 DIAGNOSIS — D259 Leiomyoma of uterus, unspecified: Secondary | ICD-10-CM | POA: Diagnosis not present

## 2018-09-04 DIAGNOSIS — R102 Pelvic and perineal pain: Secondary | ICD-10-CM | POA: Diagnosis not present

## 2018-09-04 DIAGNOSIS — D250 Submucous leiomyoma of uterus: Secondary | ICD-10-CM

## 2018-09-04 DIAGNOSIS — Z86718 Personal history of other venous thrombosis and embolism: Secondary | ICD-10-CM

## 2018-09-04 DIAGNOSIS — I10 Essential (primary) hypertension: Secondary | ICD-10-CM

## 2018-09-04 DIAGNOSIS — K861 Other chronic pancreatitis: Secondary | ICD-10-CM

## 2018-09-04 MED ORDER — TRAMADOL HCL 50 MG PO TABS: 100.0000 mg | ORAL_TABLET | Freq: Four times a day (QID) | ORAL | 1 refills | Status: DC | PRN

## 2018-09-04 NOTE — H&P (Signed)
GYNECOLOGY PREOPERATIVE HISTORY AND PHYSICAL   Subjective:  Amy Wolfe is a 43 y.o. [id number] here for surgical management of pelvic pain, fibroid uterus, history of abnormal uterine bleeding, and anemia requiring blood transfusions.  She is s/p uterine artery embolization procedure in May 2020, however she has had persistent pelvic pain since this time. Significant preoperative concern is her history of anemia, last Hgb in June was 8.9 .  Also with chronic pancreatitis, history of blood clots on anti-coagulant therapy. Also with a PMH of HTN and hypothyroidism.   Proposed surgery: Laparoscopic- Assisted Vaginal Hysterectomy possible TVH), with bilateral salpingectomy.     Pertinent Gynecological History: Menses: flow is lighter since May, lasting 4-5 days.  Contraception: condoms (intermittently) Last mammogram: patient has never had one.   Last pap: normal, per patient, performed in [place] [employer]). Date: April 2020.    Past Medical History:  Diagnosis Date  . Coronary artery disease   . H/O blood clots   . Hypertension   . Liver abscess   . Pancreatitis   . Thyroid disease   . Uterine fibroid     Past Surgical History:  Procedure Laterality Date  . CESAREAN SECTION    . EMBOLIZATION N/A 06/14/2018   Procedure: Uterine Embolization;  Surgeon: Algernon Huxley, MD;  Location: [facility];  Service: Cardiovascular;  Laterality: N/A;  . THYROIDECTOMY, PARTIAL      OB History  Gravida Para Term Preterm AB Living  4 2 2   2 2   SAB TAB Ectopic Multiple Live Births  1 1     2     # Outcome Date GA Lbr Len/2nd Weight Sex Delivery Anes PTL Lv  4 TAB           3 SAB           2 Term           1 Term             Family History  Problem Relation Age of Onset  . Hypertension Mother   . Diabetes Mother   . Cancer Father   . Cancer Paternal Grandmother   . Cancer Paternal Grandfather     Social History   Socioeconomic History  . Marital status:  Single    Spouse name: Not on file  . Number of children: Not on file  . Years of education: Not on file  . Highest education level: Not on file  Occupational History  . Not on file  Social Needs  . Financial resource strain: Not on file  . Food insecurity    Worry: Not on file    Inability: Not on file  . Transportation needs    Medical: Not on file    Non-medical: Not on file  Tobacco Use  . Smoking status: Current Every Day Smoker    Types: Cigarettes  . Smokeless tobacco: Never Used  Substance and Sexual Activity  . Alcohol use: Not Currently  . Drug use: Not Currently    Types: Marijuana  . Sexual activity: Not Currently  Lifestyle  . Physical activity    Days per week: Not on file    Minutes per session: Not on file  . Stress: Not on file  Relationships  . Social Herbalist on phone: Not on file    Gets together: Not on file    Attends religious service: Not on file    Active member of club or  organization: Not on file    Attends meetings of clubs or organizations: Not on file    Relationship status: Not on file  . Intimate partner violence    Fear of current or ex partner: Not on file    Emotionally abused: Not on file    Physically abused: Not on file    Forced sexual activity: Not on file  Other Topics Concern  . Not on file  Social History Narrative  . Not on file    Current Outpatient Medications on File Prior to Visit  Medication Sig Dispense Refill  . amLODipine (NORVASC) 10 MG tablet Take 1 tablet (10 mg total) by mouth daily. 90 tablet 0  . docusate sodium (COLACE) 100 MG capsule Take 1 capsule (100 mg total) by mouth 2 (two) times daily as needed. 30 capsule 2  . ferrous sulfate (FERROUSUL) 325 (65 FE) MG tablet Take 1 tablet (325 mg total) by mouth 2 (two) times daily. 60 tablet 1  . lipase/protease/amylase (CREON) 36000 UNITS CPEP capsule Take 2 capsules with each meal and 1 capsule with a snack 210 capsule 3  . pantoprazole (PROTONIX)  40 MG tablet Take 1 tablet (40 mg total) by mouth daily. 30 tablet 6  . rivaroxaban (XARELTO) 20 MG TABS tablet Take 1 tablet (20 mg total) by mouth daily with supper. 30 tablet 0  . acetaminophen-codeine (TYLENOL #3) 300-30 MG tablet Take 1-2 tablets by mouth every 4 (four) hours as needed for moderate pain. (Patient not taking: Reported on 08/15/2018) 30 tablet 1   No current facility-administered medications on file prior to visit.    No Known Allergies    Review of Systems Constitutional: No recent fever/chills/sweats Respiratory: No recent cough/bronchitis Cardiovascular: No chest pain Gastrointestinal: No recent nausea/vomiting/diarrhea Genitourinary: No UTI symptoms Hematologic/lymphatic:No history of coagulopathy or recent blood thinner use    Objective:   Blood pressure 115/75, pulse 80, height 5\' 7"  (1.702 m), weight 168 lb 14.4 oz (76.6 kg), last menstrual period 09/02/2018. CONSTITUTIONAL: Well-developed, well-nourished female in no acute distress.  HENT:  Normocephalic, atraumatic, External right and left ear normal. Oropharynx is clear and moist EYES: Conjunctivae and EOM are normal. Pupils are equal, round, and reactive to light. No scleral icterus.  NECK: Normal range of motion, supple, no masses SKIN: Skin is warm and dry. No rash noted. Not diaphoretic. No erythema. No pallor. NEUROLOGIC: Alert and oriented to person, place, and time. Normal reflexes, muscle tone coordination. No cranial nerve deficit noted. PSYCHIATRIC: Normal mood and affect. Normal behavior. Normal judgment and thought content. CARDIOVASCULAR: Normal heart rate noted, regular rhythm RESPIRATORY: Effort and breath sounds normal, no problems with respiration noted ABDOMEN: Soft, non-distended, mild to moderate tenderness in the lower abdomen. PELVIC: Deferred MUSCULOSKELETAL: Normal range of motion. No edema and no tenderness. 2+ distal pulses.    Labs: No results found for this or any previous  visit (from the past 336 hour(s)). CBC pending.   Imaging Studies: Patient Name: Amy Wolfe DOB: 30-Apr-1975 MRN: [id number] ULTRASOUND REPORT  Location: [facility]  Date of Service: 09/04/2018     Indications:Enlarged Uterus Findings:  The uterus is anteverted and measures 11.7 x 6.0 x 9.0 cm. Echo texture is heterogenous with evidence of focal masses. Within the uterus are multiple suspected fibroids measuring: Fibroid 1:Anterior RT IM 3.2 x 3.1 x 3.2 cm Fibroid 2:LT pedunculated 3.3 x 2.6 x 3.2 cm. Fibroid 3: Posterior pedunculated 3.2 x 3.2 x 3.2 cm Fibroid 4. Posterior M 1.8 x  1.8 x 1.8 cm Fibroid 5. Posterior Rt IM 3.2 x 3.2 x 3.4 cm  The Endometrium measures 13 mm.  Right Ovary measures not seen  Left Ovary measures not seen  Survey of the adnexa demonstrates no adnexal masses. There is no free fluid in the cul de sac.  Impression: 1. Enlarge uterus due to multiple fibroids as described above. 2. Ovaries not seen due to fibroids.  Recommendations: 1.Clinical correlation with the patient's History and Physical Exam.   Jenine M. Albertine Grates    RDMS  Assessment:    Pelvic pain Fibroid uterus S/p uterine fibroid embolization Dysfunctional uterine bleeding History of anemia  HTN Hypothyroidism H/o blood clots on anticoagulation Chronic pancreatitis   Plan:    Counseling: Procedure, risks, reasons, benefits and complications (including injury to bowel, bladder, major blood vessel, ureter, bleeding, possibility of transfusion, infection, or fistula formation) reviewed in detail. Likelihood of success in alleviating the patient's condition was discussed. Routine postoperative instructions will be reviewed with the patient and her family in detail after surgery.  The patient concurred with the proposed plan, giving informed written consent for the surgery.   Preop testing ordered. Will need pre-operative medical clearance.  Instructions reviewed,  including NPO after midnight.      Rubie Maid, MD [facility]

## 2018-09-04 NOTE — Progress Notes (Signed)
GYNECOLOGY PROGRESS NOTE  Subjective:    Patient ID: Amy Wolfe, female    DOB: [birth year], 43 y.o.   MRN: [id number]  HPI  Patient is a 43 y.o. [id number] female who presents for follow up after pelvic ultrasound.  She is s/p uterine artery embolization in June for uterine fibroids and severe anemia requiring a blood transfusion.  Patient continues to complain of pelvic pain.  Notes pain is interfering with her daily activates and life. She currently is taking Tramadol for pain relief.  Notes that it gets her through the day, but still has some residual pain.  Declines anything stronger at this time as she is worried that it will make her too drowsy to work.  Also still noting bleeding (although lighter than prior to her procedure).   The following portions of the patient's history were reviewed and updated as appropriate: allergies, current medications, past family history, past medical history, past social history, past surgical history and problem list.  Review of Systems Pertinent items noted in HPI and remainder of comprehensive ROS otherwise negative.   Objective:   Blood pressure 115/75, pulse 80, height 5\' 7"  (1.702 m), weight 168 lb 14.4 oz (76.6 kg), last menstrual period 09/02/2018. General appearance: alert and no distress Remainder of exam unchanged since last visit 08/15/2018   Imaging:  Patient Name: Amy Wolfe DOB: 01/09/1975 MRN: [id number] ULTRASOUND REPORT  Location: [facility]  Date of Service: 09/04/2018     Indications:Enlarged Uterus Findings:  The uterus is anteverted and measures 11.7 x 6.0 x 9.0 cm. Echo texture is heterogenous with evidence of focal masses. Within the uterus are multiple suspected fibroids measuring: Fibroid 1:Anterior RT IM 3.2 x 3.1 x 3.2 cm Fibroid 2:LT pedunculated 3.3 x 2.6 x 3.2 cm. Fibroid 3: Posterior pedunculated 3.2 x 3.2 x 3.2 cm Fibroid 4. Posterior M 1.8 x 1.8 x 1.8 cm Fibroid 5. Posterior Rt IM 3.2 x 3.2  x 3.4 cm  The Endometrium measures 13 mm.  Right Ovary measures not seen  Left Ovary measures not seen  Survey of the adnexa demonstrates no adnexal masses. There is no free fluid in the cul de sac.  Impression: 1. Enlarge uterus due to multiple fibroids as described above. 2. Ovaries not seen due to fibroids.  Recommendations: 1.Clinical correlation with the patient's History and Physical Exam.   Jenine M. Albertine Grates    RDMS   Assessment:  Pelvic pain Fibroid uterus S/p uterine fibroid embolization Dysfunctional uterine bleeding History of anemia  HTN Hypothyroidism H/o blood clots on anticoagulation Chronic pancreatitis  Plan:  1. Discussed that at this time, the only option to help with her pain and bleeding would be a hysterectomy.  No evidence of uterine necrosis or other causes of pain.  Patient notes that she is ok with proceeding with hysterectomy. I proposed doing a laparoscopic-assisted vaginal hysterectomy (LAVH) (but possible (TVH)) and prophylactic bilateral salpingectomy.  No indication for oophorectomy.  Patient agrees with this proposed surgery.  The risks of surgery were discussed in detail with the patient including but not limited to: bleeding which may require transfusion or reoperation; infection which may require antibiotics; injury to bowel, bladder, ureters or other surrounding organs; need for additional procedures including laparotomy; thromboembolic phenomenon, incisional problems and other postoperative/anesthesia complications.  Patient was also advised that she will remain in house for 1-2 nights; and expected recovery time after a hysterectomy is 6-8 weeks.  Patient was told that the likelihood that her condition and  symptoms will be treated effectively with this surgical management was very high; the postoperative expectations were also discussed in detail. The patient also understands the alternative treatment options which were discussed in full.  All questions were answered.  She was told that she will be contacted by our surgical scheduler regarding the time and date of her surgery; routine preoperative instructions will be given to her by the preoperative nursing team.   She is aware of need for preoperative COVID testing and subsequent quarantine from time of test to time of surgery; she will be given further preoperative instructions at that [place] screening visit.   Routine postoperative instructions will be reviewed with the patient in detail after surgery. Bleeding precautions were reviewed. Printed patient education handouts about the procedure was given to the patient to review at home.  Scheduled for 09/30/2018.  2. Desires refill on pain medication until surgery. Refill given on Tramadol.  3. Will check Hgb level today, last Hgb was 8.9 on 06/17/2018.  Patient notes that she tries to take her PO iron supplementation but it does cause some GI upset.  Tries to take it at night. Given samples of [place].  4. Dysfunctional uterine bleeding - Has had modest improvement with UFE. Will recheck H/H.  5. Patient will need medical preoperative clearance and peri-operative anticoagulation therapy management.     Rubie Maid, MD [facility]

## 2018-09-04 NOTE — Progress Notes (Signed)
Pt is present for a follow up after having an ultrasound today. Pt stated that she is still in pain and would like a refill of tramadol.

## 2018-09-04 NOTE — H&P (View-Only) (Signed)
GYNECOLOGY PREOPERATIVE HISTORY AND PHYSICAL   Subjective:  Amy Wolfe is a 43 y.o. [id number] here for surgical management of pelvic pain, fibroid uterus, history of abnormal uterine bleeding, and anemia requiring blood transfusions.  She is s/p uterine artery embolization procedure in May 2020, however she has had persistent pelvic pain since this time. Significant preoperative concern is her history of anemia, last Hgb in June was 8.9 .  Also with chronic pancreatitis, history of blood clots on anti-coagulant therapy. Also with a PMH of HTN and hypothyroidism.   Proposed surgery: Laparoscopic- Assisted Vaginal Hysterectomy possible TVH), with bilateral salpingectomy.     Pertinent Gynecological History: Menses: flow is lighter since May, lasting 4-5 days.  Contraception: condoms (intermittently) Last mammogram: patient has never had one.   Last pap: normal, per patient, performed in [place] [employer]). Date: April 2020.    Past Medical History:  Diagnosis Date  . Coronary artery disease   . H/O blood clots   . Hypertension   . Liver abscess   . Pancreatitis   . Thyroid disease   . Uterine fibroid     Past Surgical History:  Procedure Laterality Date  . CESAREAN SECTION    . EMBOLIZATION N/A 06/14/2018   Procedure: Uterine Embolization;  Surgeon: Algernon Huxley, MD;  Location: [facility];  Service: Cardiovascular;  Laterality: N/A;  . THYROIDECTOMY, PARTIAL      OB History  Gravida Para Term Preterm AB Living  4 2 2   2 2   SAB TAB Ectopic Multiple Live Births  1 1     2     # Outcome Date GA Lbr Len/2nd Weight Sex Delivery Anes PTL Lv  4 TAB           3 SAB           2 Term           1 Term             Family History  Problem Relation Age of Onset  . Hypertension Mother   . Diabetes Mother   . Cancer Father   . Cancer Paternal Grandmother   . Cancer Paternal Grandfather     Social History   Socioeconomic History  . Marital status:  Single    Spouse name: Not on file  . Number of children: Not on file  . Years of education: Not on file  . Highest education level: Not on file  Occupational History  . Not on file  Social Needs  . Financial resource strain: Not on file  . Food insecurity    Worry: Not on file    Inability: Not on file  . Transportation needs    Medical: Not on file    Non-medical: Not on file  Tobacco Use  . Smoking status: Current Every Day Smoker    Types: Cigarettes  . Smokeless tobacco: Never Used  Substance and Sexual Activity  . Alcohol use: Not Currently  . Drug use: Not Currently    Types: Marijuana  . Sexual activity: Not Currently  Lifestyle  . Physical activity    Days per week: Not on file    Minutes per session: Not on file  . Stress: Not on file  Relationships  . Social Herbalist on phone: Not on file    Gets together: Not on file    Attends religious service: Not on file    Active member of club or  organization: Not on file    Attends meetings of clubs or organizations: Not on file    Relationship status: Not on file  . Intimate partner violence    Fear of current or ex partner: Not on file    Emotionally abused: Not on file    Physically abused: Not on file    Forced sexual activity: Not on file  Other Topics Concern  . Not on file  Social History Narrative  . Not on file    Current Outpatient Medications on File Prior to Visit  Medication Sig Dispense Refill  . amLODipine (NORVASC) 10 MG tablet Take 1 tablet (10 mg total) by mouth daily. 90 tablet 0  . docusate sodium (COLACE) 100 MG capsule Take 1 capsule (100 mg total) by mouth 2 (two) times daily as needed. 30 capsule 2  . ferrous sulfate (FERROUSUL) 325 (65 FE) MG tablet Take 1 tablet (325 mg total) by mouth 2 (two) times daily. 60 tablet 1  . lipase/protease/amylase (CREON) 36000 UNITS CPEP capsule Take 2 capsules with each meal and 1 capsule with a snack 210 capsule 3  . pantoprazole (PROTONIX)  40 MG tablet Take 1 tablet (40 mg total) by mouth daily. 30 tablet 6  . rivaroxaban (XARELTO) 20 MG TABS tablet Take 1 tablet (20 mg total) by mouth daily with supper. 30 tablet 0  . acetaminophen-codeine (TYLENOL #3) 300-30 MG tablet Take 1-2 tablets by mouth every 4 (four) hours as needed for moderate pain. (Patient not taking: Reported on 08/15/2018) 30 tablet 1   No current facility-administered medications on file prior to visit.    No Known Allergies    Review of Systems Constitutional: No recent fever/chills/sweats Respiratory: No recent cough/bronchitis Cardiovascular: No chest pain Gastrointestinal: No recent nausea/vomiting/diarrhea Genitourinary: No UTI symptoms Hematologic/lymphatic:No history of coagulopathy or recent blood thinner use    Objective:   Blood pressure 115/75, pulse 80, height 5\' 7"  (1.702 m), weight 168 lb 14.4 oz (76.6 kg), last menstrual period 09/02/2018. CONSTITUTIONAL: Well-developed, well-nourished female in no acute distress.  HENT:  Normocephalic, atraumatic, External right and left ear normal. Oropharynx is clear and moist EYES: Conjunctivae and EOM are normal. Pupils are equal, round, and reactive to light. No scleral icterus.  NECK: Normal range of motion, supple, no masses SKIN: Skin is warm and dry. No rash noted. Not diaphoretic. No erythema. No pallor. NEUROLOGIC: Alert and oriented to person, place, and time. Normal reflexes, muscle tone coordination. No cranial nerve deficit noted. PSYCHIATRIC: Normal mood and affect. Normal behavior. Normal judgment and thought content. CARDIOVASCULAR: Normal heart rate noted, regular rhythm RESPIRATORY: Effort and breath sounds normal, no problems with respiration noted ABDOMEN: Soft, non-distended, mild to moderate tenderness in the lower abdomen. PELVIC: Deferred MUSCULOSKELETAL: Normal range of motion. No edema and no tenderness. 2+ distal pulses.    Labs: No results found for this or any previous  visit (from the past 336 hour(s)). CBC pending.   Imaging Studies: Patient Name: Amy Wolfe DOB: 1975-01-27 MRN: [id number] ULTRASOUND REPORT  Location: [facility]  Date of Service: 09/04/2018     Indications:Enlarged Uterus Findings:  The uterus is anteverted and measures 11.7 x 6.0 x 9.0 cm. Echo texture is heterogenous with evidence of focal masses. Within the uterus are multiple suspected fibroids measuring: Fibroid 1:Anterior RT IM 3.2 x 3.1 x 3.2 cm Fibroid 2:LT pedunculated 3.3 x 2.6 x 3.2 cm. Fibroid 3: Posterior pedunculated 3.2 x 3.2 x 3.2 cm Fibroid 4. Posterior M 1.8 x  1.8 x 1.8 cm Fibroid 5. Posterior Rt IM 3.2 x 3.2 x 3.4 cm  The Endometrium measures 13 mm.  Right Ovary measures not seen  Left Ovary measures not seen  Survey of the adnexa demonstrates no adnexal masses. There is no free fluid in the cul de sac.  Impression: 1. Enlarge uterus due to multiple fibroids as described above. 2. Ovaries not seen due to fibroids.  Recommendations: 1.Clinical correlation with the patient's History and Physical Exam.   Jenine M. Albertine Grates    RDMS  Assessment:    Pelvic pain Fibroid uterus S/p uterine fibroid embolization Dysfunctional uterine bleeding History of anemia  HTN Hypothyroidism H/o blood clots on anticoagulation Chronic pancreatitis   Plan:    Counseling: Procedure, risks, reasons, benefits and complications (including injury to bowel, bladder, major blood vessel, ureter, bleeding, possibility of transfusion, infection, or fistula formation) reviewed in detail. Likelihood of success in alleviating the patient's condition was discussed. Routine postoperative instructions will be reviewed with the patient and her family in detail after surgery.  The patient concurred with the proposed plan, giving informed written consent for the surgery.   Preop testing ordered. Will need pre-operative medical clearance.  Instructions reviewed,  including NPO after midnight.      Rubie Maid, MD [facility]

## 2018-09-04 NOTE — Patient Instructions (Signed)
Laparoscopically Assisted Vaginal Hysterectomy A laparoscopically assisted vaginal hysterectomy (LAVH) is a surgical procedure to remove the uterus and cervix. Sometimes, the ovaries and fallopian tubes are also removed. This surgery may be done to treat problems such as:  Noncancerous growths in the uterus (uterine fibroids) that cause symptoms.  A condition that causes the lining of the uterus to grow in other areas (endometriosis).  Problems with pelvic support.  Cancer of the cervix, ovaries, uterus, or tissue that lines the uterus (endometrium).  Excessive (dysfunctional) uterine bleeding. During an LAVH, some of the surgical removal is done through the vagina, and the rest is done through a few small incisions in the abdomen. This technique may be an option for women who are not able to have a vaginal hysterectomy. Tell a health care provider about:  Any allergies you have.  All medicines you are taking, including vitamins, herbs, eye drops, creams, and over-the-counter medicines.  Any problems you or family members have had with anesthetic medicines.  Any blood disorders you have.  Any surgeries you have had.  Any medical conditions you have.  Whether you are pregnant or may be pregnant. What are the risks? Generally, this is a safe procedure. However, problems may occur, including:  Infection.  Bleeding.  Allergic reactions to medicines.  Damage to other structures or organs.  Difficulty breathing. What happens before the procedure? Staying hydrated Follow instructions from your health care provider about hydration, which may include:  Up to 2 hours before the procedure - you may continue to drink clear liquids, such as water, clear fruit juice, black coffee, and plain tea. Eating and drinking restrictions Follow instructions from your health care provider about eating and drinking, which may include:  8 hours before the procedure - stop eating heavy meals or  foods such as meat, fried foods, or fatty foods.  6 hours before the procedure - stop eating light meals or foods, such as toast or cereal.  6 hours before the procedure - stop drinking milk or drinks that contain milk.  2 hours before the procedure - stop drinking clear liquids. Medicines  Ask your health care provider about: ? Changing or stopping your regular medicines. This is especially important if you are taking diabetes medicines or blood thinners. ? Taking over-the-counter medicines, vitamins, herbs, and supplements. ? Taking medicines such as aspirin and ibuprofen. These medicines can thin your blood. Do not take these medicines unless your health care provider tells you to take them.  You may be asked to take a medicine to empty your colon (bowel preparation).  You may be given antibiotic medicine to help prevent infection. General instructions  Plan to have someone take you home from the hospital or clinic.  Ask your health care provider how your surgical site will be marked or identified.  You may be asked to shower with a germ-killing soap.  Do not use any products that contain nicotine or tobacco, such as cigarettes and e-cigarettes. These can delay healing after surgery. If you need help quitting, ask your health care provider. What happens during the procedure?  To lower your risk of infection: ? Your health care team will wash or sanitize their hands. ? Hair may be removed from the surgical area. ? Your skin will be washed with soap.  An IV will be inserted into one of your veins.  You will be given one or more of the following: ? A medicine to help you relax (sedative). ? A medicine to  make you fall asleep (general anesthetic).  You may have a flexible tube (catheter) put into your bladder to drain urine.  You may have a tube put through your nose or mouth down into your stomach (nasogastric tube). The nasogastric tube will remove digestive fluids and  prevent nausea and vomiting.  Tight-fitting (compression) stockings will be placed on your legs to promote circulation.  Three or four small incisions will be made in your abdomen. An incision will also be made in your vagina.  Probes and tools will be inserted into the small incisions. The uterus and cervix (and possibly the ovaries and fallopian tubes) will be removed through your vagina as well as through the small incisions that were made in the abdomen.  The incisions will then be closed with stitches (sutures). The procedure may vary among health care providers and hospitals. What happens after the procedure?  Your blood pressure, heart rate, breathing rate, and blood oxygen level will be monitored until the medicines you were given have worn off.  You may have a liquid diet at first. You will most likely return to your usual diet the day after surgery.  You will still have the urinary catheter in place. It will likely be removed the day after surgery.  You may have to wear compression stockings. These stockings help to prevent blood clots and reduce swelling in your legs.  You will be encouraged to walk as soon as possible. You will also use a device or do breathing exercises to keep your lungs clear.  Do not drive for 24 hours if you were given a sedative. Summary  A laparoscopically assisted vaginal hysterectomy (LAVH) is a surgical procedure to remove the uterus and cervix, and sometimes the ovaries and fallopian tubes.  Follow instructions from your health care provider about eating and drinking before the procedure.  During an LAVH, some of the surgical removal is done through the vagina, and the rest is done through a few small incisions in the abdomen. This information is not intended to replace advice given to you by your health care provider. Make sure you discuss any questions you have with your health care provider. Document Released: 12/08/2010 Document Revised:  02/11/2018 Document Reviewed: 03/16/2016 Elsevier Patient Education  2020 [employer]   

## 2018-09-05 LAB — HEMOGLOBIN AND HEMATOCRIT, BLOOD
Hematocrit: 37.1 % (ref 34.0–46.6)
Hemoglobin: 11 g/dL — ABNORMAL LOW (ref 11.1–15.9)

## 2018-09-17 ENCOUNTER — Other Ambulatory Visit: Payer: Self-pay | Admitting: Obstetrics and Gynecology

## 2018-09-20 ENCOUNTER — Inpatient Hospital Stay: Admission: RE | Admit: 2018-09-20 | Payer: Self-pay | Source: Ambulatory Visit

## 2018-09-23 ENCOUNTER — Encounter: Payer: Self-pay | Admitting: Nurse Practitioner

## 2018-09-23 ENCOUNTER — Ambulatory Visit: Payer: Medicaid Other | Admitting: Nurse Practitioner

## 2018-09-26 ENCOUNTER — Encounter: Payer: Medicaid Other | Admitting: Obstetrics and Gynecology

## 2018-09-26 ENCOUNTER — Other Ambulatory Visit: Admission: RE | Admit: 2018-09-26 | Payer: Self-pay | Source: Ambulatory Visit

## 2018-09-26 ENCOUNTER — Encounter
Admission: RE | Admit: 2018-09-26 | Discharge: 2018-09-26 | Disposition: A | Discharge status: Home / Self Care | Payer: Medicaid Other | Source: Ambulatory Visit | Attending: Obstetrics and Gynecology | Admitting: Obstetrics and Gynecology

## 2018-09-26 ENCOUNTER — Telehealth: Payer: Self-pay | Admitting: Obstetrics and Gynecology

## 2018-09-26 ENCOUNTER — Other Ambulatory Visit: Payer: Medicaid Other

## 2018-09-26 ENCOUNTER — Other Ambulatory Visit: Payer: Self-pay

## 2018-09-26 DIAGNOSIS — Z01818 Encounter for other preprocedural examination: Secondary | ICD-10-CM | POA: Diagnosis not present

## 2018-09-26 DIAGNOSIS — Z20828 Contact with and (suspected) exposure to other viral communicable diseases: Secondary | ICD-10-CM | POA: Insufficient documentation

## 2018-09-26 DIAGNOSIS — I10 Essential (primary) hypertension: Secondary | ICD-10-CM | POA: Insufficient documentation

## 2018-09-26 HISTORY — DX: Unspecified abdominal pain: R10.9

## 2018-09-26 HISTORY — DX: Gastro-esophageal reflux disease without esophagitis: K21.9

## 2018-09-26 HISTORY — DX: Anxiety disorder, unspecified: F41.9

## 2018-09-26 HISTORY — DX: Dyspnea, unspecified: R06.00

## 2018-09-26 LAB — RAPID HIV SCREEN (HIV 1/2 AB+AG)
HIV 1/2 Antibodies: NONREACTIVE
HIV-1 P24 Antigen - HIV24: NONREACTIVE

## 2018-09-26 LAB — PROTIME-INR
INR: 1.1 (ref 0.8–1.2)
Prothrombin Time: 14.5 seconds (ref 11.4–15.2)

## 2018-09-26 LAB — CBC
HCT: 39.4 % (ref 36.0–46.0)
Hemoglobin: 11.7 g/dL — ABNORMAL LOW (ref 12.0–15.0)
MCH: 22.8 pg — ABNORMAL LOW (ref 26.0–34.0)
MCHC: 29.7 g/dL — ABNORMAL LOW (ref 30.0–36.0)
MCV: 76.8 fL — ABNORMAL LOW (ref 80.0–100.0)
Platelets: 317 10*3/uL (ref 150–400)
RBC: 5.13 MIL/uL — ABNORMAL HIGH (ref 3.87–5.11)
RDW: 20.3 % — ABNORMAL HIGH (ref 11.5–15.5)
WBC: 6.1 10*3/uL (ref 4.0–10.5)
nRBC: 0 % (ref 0.0–0.2)

## 2018-09-26 LAB — COMPREHENSIVE METABOLIC PANEL
ALT: 20 U/L (ref 0–44)
AST: 20 U/L (ref 15–41)
Albumin: 3.8 g/dL (ref 3.5–5.0)
Alkaline Phosphatase: 102 U/L (ref 38–126)
Anion gap: 11 (ref 5–15)
BUN: 12 mg/dL (ref 6–20)
CO2: 26 mmol/L (ref 22–32)
Calcium: 8.9 mg/dL (ref 8.9–10.3)
Chloride: 101 mmol/L (ref 98–111)
Creatinine, Ser: 0.55 mg/dL (ref 0.44–1.00)
GFR calc Af Amer: >60 mL/min (ref >60)
GFR calc non Af Amer: >60 mL/min (ref >60)
Glucose, Bld: 94 mg/dL (ref 70–99)
Potassium: 4 mmol/L (ref 3.5–5.1)
Sodium: 138 mmol/L (ref 135–145)
Total Bilirubin: 0.4 mg/dL (ref 0.3–1.2)
Total Protein: 7.1 g/dL (ref 6.5–8.1)

## 2018-09-26 LAB — TYPE AND SCREEN
ABO/RH(D): O POS
Antibody Screen: NEGATIVE

## 2018-09-26 LAB — APTT: aPTT: 34 seconds (ref 24–36)

## 2018-09-26 NOTE — Telephone Encounter (Signed)
The patient called and stated that she needs to speak with Dr. Marcelline Mates or her nurse in regards to a medication concern. The patient needs to know if she needs to stop taking her blood thinning medication prior to having surgery next week. Pt is requesting a call back if possible before the end of the day. Please advise.

## 2018-09-26 NOTE — Telephone Encounter (Signed)
Pt called and is advised to stop taking the blood thinning medication. Pt stated that her took a dose today. Pt is aware to not taking anymore of the medication.

## 2018-09-26 NOTE — Patient Instructions (Addendum)
Your procedure is scheduled on: 09/30/2018 Mon Report to Same Day Surgery 2nd floor medical mall [facility] Entrance-take elevator on left to 2nd floor.  Check in with surgery information desk.) To find out your arrival time please call [phone] between 1PM - 3PM on 09/27/2018 Fri  Remember: Instructions that are not followed completely may result in serious medical risk, up to and including death, or upon the discretion of your surgeon and anesthesiologist your surgery may need to be rescheduled.    _x___ 1. Do not eat food after midnight the night before your procedure. You may drink clear liquids up to 2 hours before you are scheduled to arrive at the hospital for your procedure.  Do not drink clear liquids within 2 hours of your scheduled arrival to the hospital.  Clear liquids include  --Water or Apple juice without pulp  --Clear carbohydrate beverage such as ClearFast or Gatorade  --Black Coffee or Clear Tea (No milk, no creamers, do not add anything to                  the coffee or Tea Type 1 and type 2 diabetics should only drink water.   ____Ensure clear carbohydrate drink on the way to the hospital for bariatric patients  ____Ensure clear carbohydrate drink 3 hours before surgery.   No gum chewing or hard candies.     __x__ 2. No Alcohol for 24 hours before or after surgery.   __x__3. No Smoking or e-cigarettes for 24 prior to surgery.  Do not use any chewable tobacco products for at least 6 hour prior to surgery   ____  4. Bring all medications with you on the day of surgery if instructed.    __x__ 5. Notify your doctor if there is any change in your medical condition     (cold, fever, infections).    x___6. On the morning of surgery brush your teeth with toothpaste and water.  You may rinse your mouth with mouth wash if you wish.  Do not swallow any toothpaste or mouthwash.   Do not wear jewelry, make-up, hairpins, clips or nail polish.  Do not wear lotions,  powders, or perfumes. You may wear deodorant.  Do not shave 48 hours prior to surgery. Men may shave face and neck.  Do not bring valuables to the hospital.    [facility] is not responsible for any belongings or valuables.               Contacts, dentures or bridgework may not be worn into surgery.  Leave your suitcase in the car. After surgery it may be brought to your room.  For patients admitted to the hospital, discharge time is determined by your                       treatment team.  _  Patients discharged the day of surgery will not be allowed to drive home.  You will need someone to drive you home and stay with you the night of your procedure.    Please read over the following fact sheets that you were given:   [facility] Preparing for Surgery and or MRSA Information   _x___ Take anti-hypertensive listed below, cardiac, seizure, asthma,     anti-reflux and psychiatric medicines. These include:  1.   2.pantoprazole (PROTONIX) 40 MG tablet  3.  4.  5.  6.  ____Fleets enema or Magnesium Citrate as directed.   _x___  Use CHG Soap or sage wipes as directed on instruction sheet   ____ Use inhalers on the day of surgery and bring to hospital day of surgery  ____ Stop Metformin and Janumet 2 days prior to surgery.    ____ Take 1/2 of usual insulin dose the night before surgery and none on the morning     surgery.   _x___ Follow recommendations from Cardiologist, Pulmonologist or PCP regarding          stopping Aspirin, Coumadin, Plavix ,Eliquis, Effient, or Pradaxa, and Pletal.  X____Stop Anti-inflammatories such as Advil, Aleve, Ibuprofen, Motrin, Naproxen, Naprosyn, Goodies powders or aspirin products. OK to take Tylenol and                          Celebrex.   _x___ Stop supplements until after surgery.  But may continue Vitamin D, Vitamin B,       and multivitamin.   ____ Bring C-Pap to the hospital.

## 2018-09-26 NOTE — Pre-Procedure Instructions (Signed)
Incentive spirometry and carbohydrate drink given along with instructions.

## 2018-09-27 LAB — SARS CORONAVIRUS 2 (TAT 6-24 HRS): SARS Coronavirus 2: NEGATIVE

## 2018-09-27 LAB — RPR: RPR Ser Ql: NONREACTIVE

## 2018-09-27 NOTE — Pre-Procedure Instructions (Signed)
Lab notified of appearance of d/c of order in order history for [facility].  States it was sent out.  If no results by DOS.  Will redraw.

## 2018-09-29 MED ORDER — CEFAZOLIN SODIUM-DEXTROSE 2-4 GM/100ML-% IV SOLN: 2.0000 g | INTRAVENOUS | Status: DC

## 2018-09-30 ENCOUNTER — Encounter
Admission: EM | Disposition: A | Discharge status: Home / Self Care | Payer: Self-pay | Source: Home / Self Care | Attending: Student in an Organized Health Care Education/Training Program

## 2018-09-30 ENCOUNTER — Encounter: Payer: Self-pay | Admitting: Certified Registered Nurse Anesthetist

## 2018-09-30 ENCOUNTER — Other Ambulatory Visit: Payer: Self-pay

## 2018-09-30 ENCOUNTER — Ambulatory Visit
Admission: RE | Admit: 2018-09-30 | Payer: Medicaid Other | Source: Home / Self Care | Admitting: Obstetrics and Gynecology

## 2018-09-30 ENCOUNTER — Emergency Department
Admission: EM | Admit: 2018-09-30 | Discharge: 2018-09-30 | Disposition: A | Discharge status: Home / Self Care | Payer: Medicaid Other | Attending: Student in an Organized Health Care Education/Training Program | Admitting: Student in an Organized Health Care Education/Training Program

## 2018-09-30 ENCOUNTER — Emergency Department: Payer: Medicaid Other

## 2018-09-30 ENCOUNTER — Encounter: Payer: Self-pay | Admitting: Emergency Medicine

## 2018-09-30 DIAGNOSIS — X500XXA Overexertion from strenuous movement or load, initial encounter: Secondary | ICD-10-CM | POA: Insufficient documentation

## 2018-09-30 DIAGNOSIS — Z79899 Other long term (current) drug therapy: Secondary | ICD-10-CM | POA: Diagnosis not present

## 2018-09-30 DIAGNOSIS — F17210 Nicotine dependence, cigarettes, uncomplicated: Secondary | ICD-10-CM | POA: Diagnosis not present

## 2018-09-30 DIAGNOSIS — Z7901 Long term (current) use of anticoagulants: Secondary | ICD-10-CM | POA: Insufficient documentation

## 2018-09-30 DIAGNOSIS — S73102A Unspecified sprain of left hip, initial encounter: Secondary | ICD-10-CM

## 2018-09-30 DIAGNOSIS — Y929 Unspecified place or not applicable: Secondary | ICD-10-CM | POA: Diagnosis not present

## 2018-09-30 DIAGNOSIS — Y9389 Activity, other specified: Secondary | ICD-10-CM | POA: Diagnosis not present

## 2018-09-30 DIAGNOSIS — I10 Essential (primary) hypertension: Secondary | ICD-10-CM | POA: Diagnosis not present

## 2018-09-30 DIAGNOSIS — S79912A Unspecified injury of left hip, initial encounter: Secondary | ICD-10-CM | POA: Diagnosis present

## 2018-09-30 DIAGNOSIS — Y999 Unspecified external cause status: Secondary | ICD-10-CM | POA: Insufficient documentation

## 2018-09-30 DIAGNOSIS — I2510 Atherosclerotic heart disease of native coronary artery without angina pectoris: Secondary | ICD-10-CM | POA: Diagnosis not present

## 2018-09-30 SURGERY — HYSTERECTOMY, VAGINAL, LAPAROSCOPY-ASSISTED, WITH SALPINGECTOMY
Anesthesia: General | Laterality: Bilateral

## 2018-09-30 MED ORDER — ACETAMINOPHEN 500 MG PO TABS: 1000.0000 mg | ORAL_TABLET | ORAL | Status: DC

## 2018-09-30 MED ORDER — HYDROCODONE-ACETAMINOPHEN 5-325 MG PO TABS: 1.0000 | ORAL_TABLET | ORAL | 0 refills | Status: DC | PRN

## 2018-09-30 MED ORDER — GABAPENTIN 300 MG PO CAPS: 300.0000 mg | ORAL_CAPSULE | ORAL | Status: DC

## 2018-09-30 MED ORDER — LACTATED RINGERS IV SOLN: INTRAVENOUS | Status: DC

## 2018-09-30 MED ORDER — ONDANSETRON HCL 4 MG/2ML IJ SOLN
4.0000 mg | Freq: Once | INTRAMUSCULAR | Status: AC
  Administered 2018-09-30: 08:00:00 4 mg via INTRAVENOUS
  Filled 2018-09-30: qty 2

## 2018-09-30 MED ORDER — HYDROMORPHONE HCL 1 MG/ML IJ SOLN
1.0000 mg | Freq: Once | INTRAMUSCULAR | Status: AC
  Administered 2018-09-30: 1 mg via INTRAVENOUS
  Filled 2018-09-30: qty 1

## 2018-09-30 MED ORDER — MORPHINE SULFATE (PF) 4 MG/ML IV SOLN
4.0000 mg | INTRAVENOUS | Status: DC | PRN
  Administered 2018-09-30 (×2): 4 mg via INTRAVENOUS
  Filled 2018-09-30 (×2): qty 1

## 2018-09-30 MED ORDER — VASOPRESSIN 20 UNIT/ML IV SOLN
INTRAVENOUS | Status: AC
  Filled 2018-09-30: qty 1

## 2018-09-30 MED ORDER — ESTROGENS, CONJUGATED 0.625 MG/GM VA CREA
TOPICAL_CREAM | VAGINAL | Status: AC
  Filled 2018-09-30: qty 30

## 2018-09-30 MED ORDER — SODIUM CHLORIDE (PF) 0.9 % IJ SOLN
INTRAMUSCULAR | Status: AC
  Filled 2018-09-30: qty 50

## 2018-09-30 MED ORDER — ENOXAPARIN SODIUM 40 MG/0.4ML ~~LOC~~ SOLN: 40.0000 mg | Freq: Once | SUBCUTANEOUS | Status: DC

## 2018-09-30 SURGICAL SUPPLY — 69 items
BAG COUNTER SPONGE EZ (MISCELLANEOUS) ×2 IMPLANT
BAG URINE DRAINAGE (UROLOGICAL SUPPLIES) ×2 IMPLANT
BLADE SURG 15 STRL LF DISP TIS (BLADE) ×1 IMPLANT
BLADE SURG 15 STRL SS (BLADE) ×1
BLADE SURG SZ10 CARB STEEL (BLADE) ×2 IMPLANT
BLADE SURG SZ11 CARB STEEL (BLADE) ×2 IMPLANT
CANISTER SUCT 1200ML W/VALVE (MISCELLANEOUS) ×2 IMPLANT
CATH FOLEY 2WAY  5CC 16FR (CATHETERS) ×1
CATH URTH 16FR FL 2W BLN LF (CATHETERS) ×1 IMPLANT
CHLORAPREP W/TINT 26 (MISCELLANEOUS) ×2 IMPLANT
COVER WAND RF STERILE (DRAPES) ×2 IMPLANT
DERMABOND ADVANCED (GAUZE/BANDAGES/DRESSINGS) ×1
DERMABOND ADVANCED .7 DNX12 (GAUZE/BANDAGES/DRESSINGS) ×1 IMPLANT
DRAPE 3/4 80X56 (DRAPES) ×2 IMPLANT
DRAPE PERI LITHO V/GYN (MISCELLANEOUS) ×2 IMPLANT
ELECT CAUTERY BLADE 6.4 (BLADE) ×2 IMPLANT
ELECT REM PT RETURN 9FT ADLT (ELECTROSURGICAL) ×2
ELECTRODE REM PT RTRN 9FT ADLT (ELECTROSURGICAL) ×1 IMPLANT
GAUZE 4X4 16PLY RFD (DISPOSABLE) ×2 IMPLANT
GAUZE PACK 2X3YD (GAUZE/BANDAGES/DRESSINGS) ×2 IMPLANT
GLOVE BIO SURGEON STRL SZ 6.5 (GLOVE) ×2 IMPLANT
GLOVE BIO SURGEON STRL SZ8 (GLOVE) ×4 IMPLANT
GLOVE INDICATOR 7.0 STRL GRN (GLOVE) ×4 IMPLANT
GOWN STRL REUS W/ TWL LRG LVL3 (GOWN DISPOSABLE) ×2 IMPLANT
GOWN STRL REUS W/TWL LRG LVL3 (GOWN DISPOSABLE) ×2
HANDLE YANKAUER SUCT BULB TIP (MISCELLANEOUS) ×2 IMPLANT
IRRIGATION STRYKERFLOW (MISCELLANEOUS) ×1 IMPLANT
IRRIGATOR STRYKERFLOW (MISCELLANEOUS) ×2
IV LACTATED RINGERS 1000ML (IV SOLUTION) ×2 IMPLANT
KIT PINK PAD W/HEAD ARE REST (MISCELLANEOUS) ×2
KIT PINK PAD W/HEAD ARM REST (MISCELLANEOUS) ×1 IMPLANT
KIT TURNOVER CYSTO (KITS) ×2 IMPLANT
LABEL OR SOLS (LABEL) ×2 IMPLANT
LIGASURE VESSEL 5MM BLUNT TIP (ELECTROSURGICAL) IMPLANT
MANIPULATOR VCARE LG CRV RETR (MISCELLANEOUS) IMPLANT
MANIPULATOR VCARE SML CRV RETR (MISCELLANEOUS) IMPLANT
MANIPULATOR VCARE STD CRV RETR (MISCELLANEOUS) IMPLANT
NEEDLE HYPO 25GX1 SAFETY (NEEDLE) ×2 IMPLANT
NEEDLE SPNL 22GX3.5 QUINCKE BK (NEEDLE) ×2 IMPLANT
NS IRRIG 500ML POUR BTL (IV SOLUTION) ×2 IMPLANT
OCCLUDER COLPOPNEUMO (BALLOONS) IMPLANT
PACK BASIN MINOR ARMC (MISCELLANEOUS) ×2 IMPLANT
PACK GYN LAPAROSCOPIC (MISCELLANEOUS) ×2 IMPLANT
PAD OB MATERNITY 4.3X12.25 (PERSONAL CARE ITEMS) ×2 IMPLANT
PAD PREP 24X41 OB/GYN DISP (PERSONAL CARE ITEMS) ×2 IMPLANT
SCISSORS METZENBAUM CVD 33 (INSTRUMENTS) ×2 IMPLANT
SHEARS HARMONIC ACE PLUS 36CM (ENDOMECHANICALS) IMPLANT
SLEEVE ENDOPATH XCEL 5M (ENDOMECHANICALS) ×4 IMPLANT
SOL PREP PVP 2OZ (MISCELLANEOUS) ×2
SOLUTION PREP PVP 2OZ (MISCELLANEOUS) ×1 IMPLANT
STRIP CLOSURE SKIN 1/2X4 (GAUZE/BANDAGES/DRESSINGS) ×2 IMPLANT
SUT CHROMIC 0 CT 1 (SUTURE) ×2 IMPLANT
SUT CHROMIC 1-0 (SUTURE) ×2 IMPLANT
SUT CHROMIC 2 0 CT 1 (SUTURE) ×2 IMPLANT
SUT MNCRL 4-0 (SUTURE) ×1
SUT MNCRL 4-0 27XMFL (SUTURE) ×1
SUT VIC AB 0 CT1 27 (SUTURE) ×4
SUT VIC AB 0 CT1 27XCR 8 STRN (SUTURE) ×4 IMPLANT
SUT VIC AB 0 CT1 36 (SUTURE) ×2 IMPLANT
SUT VIC AB 0 CT2 27 (SUTURE) ×2 IMPLANT
SUT VIC AB 2-0 CT1 (SUTURE) ×2 IMPLANT
SUT VIC AB 4-0 FS2 27 (SUTURE) ×2 IMPLANT
SUTURE MNCRL 4-0 27XMF (SUTURE) ×1 IMPLANT
SYR 10ML LL (SYRINGE) ×2 IMPLANT
SYR 50ML LL SCALE MARK (SYRINGE) IMPLANT
SYR CONTROL 10ML LL (SYRINGE) ×2 IMPLANT
TAPE TRANSPORE STRL 2 31045 (GAUZE/BANDAGES/DRESSINGS) ×2 IMPLANT
TROCAR XCEL NON-BLD 5MMX100MML (ENDOMECHANICALS) ×2 IMPLANT
TUBING EVAC SMOKE HEATED PNEUM (TUBING) ×2 IMPLANT

## 2018-09-30 NOTE — ED Notes (Signed)
Pt given phone to speak with mri

## 2018-09-30 NOTE — ED Notes (Signed)
Dr Cherry at bedside  

## 2018-09-30 NOTE — ED Triage Notes (Signed)
Pt via ems from home with dislocated hip. She had sex and put her legs over her head and awakened with bad hip pain. Pt is scheduled for hysterectomy this morning. Pt received 100 mcg fentanyl pta, 75 mcg at 0638, 25 mcg at 0701. Pt alert & oriented, very vocal about her pain, but pleasant.

## 2018-09-30 NOTE — ED Provider Notes (Signed)
[facility] Emergency Department Provider Note    First MD Initiated Contact with Patient 09/30/18 [phone]     (approximate)  I have reviewed the triage vital signs and the nursing notes.   HISTORY  Chief Complaint Hip Injury    HPI Amy Wolfe is a 43 y.o. female below listed past medical history presents the ER for evaluation of acute left hip pain.  Patient states the pain initially started yesterday morning after she was having sex.  States that she is very flexible and had her feet folded behind her head while they are having intercourse.  States that she did feel a popping sensation.  Was able to walk after that but started having worsening pain throughout the day.  States that she woke up this morning unable to bear weight and unable to move her left hip.  Denies any fevers.  States that she is scheduled for hysterectomy at 89.    Past Medical History:  Diagnosis Date   Abdominal pain    Anxiety    Coronary artery disease    Dyspnea    GERD (gastroesophageal reflux disease)    H/O blood clots    Hypertension    Liver abscess    Pancreatitis    Thyroid disease    Uterine fibroid    Family History  Problem Relation Age of Onset   Hypertension Mother    Diabetes Mother    Cancer Father    Cancer Paternal Grandmother    Cancer Paternal Grandfather    Past Surgical History:  Procedure Laterality Date   ABDOMINAL HYSTERECTOMY     CESAREAN SECTION     EMBOLIZATION N/A 06/14/2018   Procedure: Uterine Embolization;  Surgeon: Algernon Huxley, MD;  Location: [place] [facility];  Service: Cardiovascular;  Laterality: N/A;   laceration finger Right    THYROIDECTOMY, PARTIAL     Patient Active Problem List   Diagnosis Date Noted   Symptomatic anemia 06/13/2018   Swelling of limb 05/17/2018   Essential hypertension 05/17/2018   Pancreatitis, chronic ([place]) 05/17/2018   Portal vein thrombosis 05/17/2018      Prior  to Admission medications   Medication Sig Start Date End Date Taking? Authorizing Provider  acetaminophen-codeine (TYLENOL #3) 300-30 MG tablet Take 1-2 tablets by mouth every 4 (four) hours as needed for moderate pain. Patient not taking: Reported on 08/15/2018 06/26/18   Rubie Maid, MD  amLODipine (NORVASC) 10 MG tablet Take 1 tablet (10 mg total) by mouth daily. Patient taking differently: Take 10 mg by mouth at bedtime.  08/15/18 11/13/18  Rubie Maid, MD  docusate sodium (COLACE) 100 MG capsule Take 1 capsule (100 mg total) by mouth 2 (two) times daily as needed. Patient not taking: Reported on 09/19/2018 06/26/18   Rubie Maid, MD  ferrous sulfate (FERROUSUL) 325 (65 FE) MG tablet Take 1 tablet (325 mg total) by mouth 2 (two) times daily. 06/26/18   Rubie Maid, MD  HYDROcodone-acetaminophen (NORCO) 5-325 MG tablet Take 1 tablet by mouth every 4 (four) hours as needed for moderate pain. 09/30/18   Merlyn Lot, MD  lipase/protease/amylase (CREON) 36000 UNITS CPEP capsule Take 2 capsules with each meal and 1 capsule with a snack Patient taking differently: Take 36,000-72,000 Units by mouth See admin instructions. Take 72,000 units with each meal and 36,000 units with a snack 08/02/18   Lucilla Lame, MD  pantoprazole (PROTONIX) 40 MG tablet Take 1 tablet (40 mg total) by mouth daily. Patient taking  differently: Take 40 mg by mouth at bedtime.  06/26/18   Rubie Maid, MD  rivaroxaban (XARELTO) 20 MG TABS tablet Take 1 tablet (20 mg total) by mouth daily with supper. 06/17/18   Stark Jock Jude, MD  traMADol (ULTRAM) 50 MG tablet Take 2 tablets (100 mg total) by mouth every 6 (six) hours as needed. Patient taking differently: Take 100 mg by mouth every 6 (six) hours as needed for moderate pain.  09/04/18   Rubie Maid, MD    Allergies Patient has no known allergies.    Social History Social History   Tobacco Use   Smoking status: Current Every Day Smoker    Packs/day: 0.50    Years:  35.00    Pack years: 17.50    Types: Cigarettes   Smokeless tobacco: Never Used  Substance Use Topics   Alcohol use: Not Currently   Drug use: Yes    Types: Marijuana    Comment: daily    Review of Systems Patient denies headaches, rhinorrhea, blurry vision, numbness, shortness of breath, chest pain, edema, cough, abdominal pain, nausea, vomiting, diarrhea, dysuria, fevers, rashes or hallucinations unless otherwise stated above in HPI. ____________________________________________   PHYSICAL EXAM:  VITAL SIGNS: Vitals:   09/30/18 0900 09/30/18 0930  BP: 132/87 140/87  Pulse: 66 68  Resp: 14 15  Temp:    SpO2: 100% 99%    Constitutional: Alert and oriented.  Eyes: Conjunctivae are normal.  Head: Atraumatic. Nose: No congestion/rhinnorhea. Mouth/Throat: Mucous membranes are moist.   Neck: No stridor. Painless ROM.  Cardiovascular: Normal rate, regular rhythm. Grossly normal heart sounds.  Good peripheral circulation. Respiratory: Normal respiratory effort.  No retractions. Lungs CTAB. Gastrointestinal: Soft and nontender. No distention. No abdominal bruits. No CVA tenderness. Genitourinary:  Musculoskeletal:left hip held in position of comfort at 45 degree flexion.  Pain with ROM.  No overlying erythema or swelling.  Compartments are soft.  NV intact distally. Neurologic:  Normal speech and language. No gross focal neurologic deficits are appreciated. No facial droop Skin:  Skin is warm, dry and intact. No rash noted. Psychiatric: Mood and affect are normal. Speech and behavior are normal.  ____________________________________________   LABS (all labs ordered are listed, but only abnormal results are displayed)  No results found for this or any previous visit (from the past 24 hour(s)). ____________________________________________  EKG My review and personal interpretation at Time: 7:10   Indication: hip pain  Rate: 80  Rhythm: sinus Axis: normal Other: normal  intervals, no stemi ____________________________________________  RADIOLOGY I personally reviewed all radiographic images ordered to evaluate for the above acute complaints and reviewed radiology reports and findings.  These findings were personally discussed with the patient.  Please see medical record for radiology report.  ____________________________________________   PROCEDURES  Procedure(s) performed:  Procedures    Critical Care performed: no ____________________________________________   INITIAL IMPRESSION / ASSESSMENT AND PLAN / ED COURSE  Pertinent labs & imaging results that were available during my care of the patient were reviewed by me and considered in my medical decision making (see chart for details).   DDX: Fracture, dislocation, contusion, strain  Amy Wolfe is a 43 y.o. who presents to the ED with symptoms as described above.  Certainly concerning for fracture or ligamentous injury.  X-ray will be ordered.  Will give IV pain medication.  Abdominal exam soft and benign.  No other complaints.  Clinical Course as of Sep 30 1139  Mon Sep 30, 2018  0801 No evidence of  dislocation.  Given the extent of her pain and limited movement will order MRI of the hip to evaluate for occult fracture or ligamentous injury.   [PR]  1052 Case discussed with Dr. Roland Rack of orthopedics.  No evidence of fracture.  Consistent with hip sprain.  Recommending crutches with outpatient follow up.   [PR]  1131 Patient pain is improved.  Given history not consistent with inflammatory or infectious process.  Most consistent with mechanical hip strain.  Blood work was initially ordered due to concern for need for preop testing in the event of fracture dislocation however given MRI showing evidence of hip sprain and no indication for operation do not feel the blood work is clinically indicated at this time.  Patient is nontoxic.  Will give crutches as well as pain control.    Have discussed with  the patient and available family all diagnostics and treatments performed thus far and all questions were answered to the best of my ability. The patient demonstrates understanding and agreement with plan.    [PR]    Clinical Course User Index [PR] Merlyn Lot, MD    The patient was evaluated in Emergency Department today for the symptoms described in the history of present illness. He/she was evaluated in the context of the global COVID-19 pandemic, which necessitated consideration that the patient might be at risk for infection with the SARS-CoV-2 virus that causes COVID-19. Institutional protocols and algorithms that pertain to the evaluation of patients at risk for COVID-19 are in a state of rapid change based on information released by regulatory bodies including the CDC and federal and state organizations. These policies and algorithms were followed during the patient's care in the ED.  As part of my medical decision making, I reviewed the following data within the [place] notes reviewed and incorporated, Labs reviewed, notes from prior ED visits and  Controlled Substance Database   ____________________________________________   FINAL CLINICAL IMPRESSION(S) / ED DIAGNOSES  Final diagnoses:  Hip sprain, left, initial encounter      NEW MEDICATIONS STARTED DURING THIS VISIT:  New Prescriptions   HYDROCODONE-ACETAMINOPHEN (NORCO) 5-325 MG TABLET    Take 1 tablet by mouth every 4 (four) hours as needed for moderate pain.     Note:  This document was prepared using Dragon voice recognition software and may include unintentional dictation errors.    Merlyn Lot, MD 09/30/18 [phone]

## 2018-09-30 NOTE — Interval H&P Note (Signed)
History and Physical Interval Note:  09/30/2018 8:36 AM   Patient currently in the Emergency Room as she came in this earlier this morning due to complaints of severe left hip pain. Concerns for possible dislocation of her hip. Review of CT scan notes no obvious dislocation or fracture.  Pending evaluation by Ortho Team and MRI for further assessment.  She was also scheduled to undergo laparoscopic hysterectomy this morning for fibroid uterus (s/p UFE treatment in March), history of severe anemia requiring blood transfusion, pelvic pain.  See below.   Amy Wolfe  is scheduled today for surgery, with the diagnosis of IRON DEFICIENCY ANEMIA DUE TO CHRONIC BLOOD LOSS, DUB, HYPERTENSION, HYPOTYROIDISM, HISTORY OF BLOOD CLOTS,ANTICOAGULATED,CHRONIC PANCREATITIS.  The various methods of treatment have been discussed with the patient and family. After consideration of risks, benefits and other options for treatment, the patient has consented to  Procedure(s): LAPAROSCOPIC ASSISTED VAGINAL HYSTERECTOMY WITH BILATERAL SALPINGECTOMY (Bilateral), Possible Vaginal-only approach as a surgical intervention.  The patient's history has been reviewed, patient examined.  She has held her anti-coagulation in preparation for her surgery.   I have reviewed the patient's chart and labs.  Questions were answered to the patient's satisfaction.    We will await evaluation from Ortho and MRI results to determine if surgery is able to move forward today.    Rubie Maid, MD [facility]

## 2018-09-30 NOTE — ED Notes (Signed)
Pt assisted with bedpan usage.

## 2018-09-30 NOTE — ED Notes (Signed)
Pt to xray

## 2018-09-30 NOTE — ED Notes (Signed)
Pt transported to mri 

## 2018-09-30 NOTE — ED Notes (Signed)
Pt discharged home after verbalizing understanding of discharge instructions; nad noted.  Pt given blue scrub pants and sandals for trip home

## 2018-10-01 ENCOUNTER — Other Ambulatory Visit: Payer: Medicaid Other

## 2018-10-01 ENCOUNTER — Encounter: Payer: Medicaid Other | Admitting: Obstetrics and Gynecology

## 2018-10-02 ENCOUNTER — Telehealth: Payer: Self-pay

## 2018-10-02 ENCOUNTER — Telehealth: Payer: Self-pay | Admitting: Obstetrics and Gynecology

## 2018-10-02 NOTE — Telephone Encounter (Signed)
Please see another phone encounter.  

## 2018-10-02 NOTE — Telephone Encounter (Signed)
Pt called stating that she was in a lot of pain. Pt had a scheduled surgery for Monday, Sept 28, 20 which was canceled due to an injury. Pt stated that needed some pain medication asap to help with the cramping from her cycle. Pt was informed that [facility] is not in the office and other providers do not giving prescription pain medication with a visit with pt.  Pt was advised to go to urgent care or back to the E.D.  Pt stated that she understood and made an appointment to see [facility] when she returns from vacation.

## 2018-10-02 NOTE — Telephone Encounter (Signed)
The patient called and stated that she is needing to reschedule her surgery due to breaking her hip, The patient also stated that she needs more pain medication as well. Pt was extremely upset and crying, spoke with nurse FH, Nurse picked up phone call. Thank you.

## 2018-10-04 ENCOUNTER — Telehealth: Payer: Self-pay | Admitting: Obstetrics and Gynecology

## 2018-10-04 ENCOUNTER — Encounter: Payer: Self-pay | Admitting: Family Medicine

## 2018-10-04 ENCOUNTER — Ambulatory Visit (INDEPENDENT_AMBULATORY_CARE_PROVIDER_SITE_OTHER): Payer: Medicaid Other | Admitting: Family Medicine

## 2018-10-04 ENCOUNTER — Other Ambulatory Visit: Payer: Self-pay

## 2018-10-04 DIAGNOSIS — E890 Postprocedural hypothyroidism: Secondary | ICD-10-CM | POA: Insufficient documentation

## 2018-10-04 DIAGNOSIS — Z9889 Other specified postprocedural states: Secondary | ICD-10-CM

## 2018-10-04 DIAGNOSIS — I10 Essential (primary) hypertension: Secondary | ICD-10-CM | POA: Diagnosis not present

## 2018-10-04 DIAGNOSIS — Z9009 Acquired absence of other part of head and neck: Secondary | ICD-10-CM | POA: Diagnosis not present

## 2018-10-04 DIAGNOSIS — K861 Other chronic pancreatitis: Secondary | ICD-10-CM | POA: Diagnosis not present

## 2018-10-04 DIAGNOSIS — I81 Portal vein thrombosis: Secondary | ICD-10-CM

## 2018-10-04 DIAGNOSIS — R102 Pelvic and perineal pain: Secondary | ICD-10-CM

## 2018-10-04 DIAGNOSIS — Z7901 Long term (current) use of anticoagulants: Secondary | ICD-10-CM

## 2018-10-04 DIAGNOSIS — D649 Anemia, unspecified: Secondary | ICD-10-CM

## 2018-10-04 MED ORDER — DOCUSATE SODIUM 100 MG PO CAPS: 100.0000 mg | ORAL_CAPSULE | Freq: Two times a day (BID) | ORAL | 2 refills | Status: DC | PRN

## 2018-10-04 MED ORDER — HYDROCODONE-ACETAMINOPHEN 5-325 MG PO TABS: 1.0000 | ORAL_TABLET | Freq: Four times a day (QID) | ORAL | 0 refills | Status: DC | PRN

## 2018-10-04 MED ORDER — TIZANIDINE HCL 4 MG PO TABS: 2.0000 mg | ORAL_TABLET | Freq: Three times a day (TID) | ORAL | 0 refills | Status: DC | PRN

## 2018-10-04 MED ORDER — NORETHINDRONE ACETATE 5 MG PO TABS: 5.0000 mg | ORAL_TABLET | Freq: Every day | ORAL | 0 refills | Status: DC

## 2018-10-04 NOTE — Progress Notes (Signed)
Name: Amy Wolfe   MRN: [id number]    DOB: 02-08-1975   Date:10/04/2018       Progress Note  Subjective  Chief Complaint  Chief Complaint  Patient presents with  . Establish Care    I connected with  Amy Wolfe  on 10/04/18 at 11:00 AM EDT by a video enabled telemedicine application and verified that I am speaking with the correct person using two identifiers.  I discussed the limitations of evaluation and management by telemedicine and the availability of in person appointments. The patient expressed understanding and agreed to proceed. Staff also discussed with the patient that there may be a patient responsible charge related to this service. Patient Location: Work (private location) Provider Location: Office Additional Individuals present: None  HPI  HTN:  Taking 10mg  Amlodipine and has been stable; Denies chest pain, shortness of breath, BLE edema.    Hypothyroidism: Had partial thyroidectomy 8-10 years ago.  Last TSH was normal 3 months, and is not on any medication at this time.  Chronic Pancreatitis/GERD: Seeing Dr. Allen Norris with GI.  Taking protonix and creon.  Her last visit with GI was 05/28/2018. She notes gas with her creon and some constipation; no diarrhea. She has not been taking colace - needs refill.  Pelvic Pain: Seeing Dr. Marcelline Mates, scheduled for hysterectomy. She notes generalized abdominal pain that started 3 days ago.  She has had intermittent nausea when her pain gets severe.  She denies fever but is having diaphoresis when she has severe pain.  States her current abdominal pain is related to her menses - has her menses right now.  She was told by Dr. Allen Norris in the past that she needs to see pain specialist - we will wait on referral to pain management until after her hysterectomy.  We will trial a muscle relaxer today.  History of blood clots/anticoagulated: She has ongoing blood clots - liver, stomach, pancreas.  She notes that this has been an issues ongoing with  her chronic pancreatitis. She is taking Xarelto. Sees Dr. Lucky Cowboy.  Patient Active Problem List   Diagnosis Date Noted  . Symptomatic anemia 06/13/2018  . Swelling of limb 05/17/2018  . Essential hypertension 05/17/2018  . Pancreatitis, chronic ([place]) 05/17/2018  . Portal vein thrombosis 05/17/2018    Past Surgical History:  Procedure Laterality Date  . CESAREAN SECTION    . EMBOLIZATION N/A 06/14/2018   Procedure: Uterine Embolization;  Surgeon: Algernon Huxley, MD;  Location: [place] [facility];  Service: Cardiovascular;  Laterality: N/A;  . laceration finger Right   . THYROIDECTOMY, PARTIAL      Family History  Problem Relation Age of Onset  . Hypertension Mother   . Diabetes Mother   . Cancer Father   . Cancer Paternal Grandmother   . Cancer Paternal Grandfather   . Aneurysm Brother     Social History   Socioeconomic History  . Marital status: Single    Spouse name: Not on file  . Number of children: 2  . Years of education: 68  . Highest education level: Associate degree: academic program  Occupational History  . Occupation: [employer]  Social Needs  . Financial resource strain: Not hard at all  . Food insecurity    Worry: Never true    Inability: Never true  . Transportation needs    Medical: No    Non-medical: No  Tobacco Use  . Smoking status: Current Every Day Smoker    Packs/day: 0.50  Years: 35.00    Pack years: 17.50    Types: Cigarettes  . Smokeless tobacco: Never Used  Substance and Sexual Activity  . Alcohol use: Not Currently  . Drug use: Yes    Types: Marijuana    Comment: daily  . Sexual activity: Not Currently  Lifestyle  . Physical activity    Days per week: 0 days    Minutes per session: 0 min  . Stress: Only a little  Relationships  . Social connections    Talks on phone: More than three times a week    Gets together: More than three times a week    Attends religious service: Never    Active member of club or organization: No     Attends meetings of clubs or organizations: Never    Relationship status: Not on file  . Intimate partner violence    Fear of current or ex partner: No    Emotionally abused: No    Physically abused: No    Forced sexual activity: No  Other Topics Concern  . Not on file  Social History Narrative  . Not on file     Current Outpatient Medications:  .  acetaminophen-codeine (TYLENOL #3) 300-30 MG tablet, Take 1-2 tablets by mouth every 4 (four) hours as needed for moderate pain., Disp: 30 tablet, Rfl: 1 .  amLODipine (NORVASC) 10 MG tablet, Take 1 tablet (10 mg total) by mouth daily. (Patient taking differently: Take 10 mg by mouth at bedtime. ), Disp: 90 tablet, Rfl: 0 .  ferrous sulfate (FERROUSUL) 325 (65 FE) MG tablet, Take 1 tablet (325 mg total) by mouth 2 (two) times daily., Disp: 60 tablet, Rfl: 1 .  pantoprazole (PROTONIX) 40 MG tablet, Take 1 tablet (40 mg total) by mouth daily. (Patient taking differently: Take 40 mg by mouth at bedtime. ), Disp: 30 tablet, Rfl: 6 .  rivaroxaban (XARELTO) 20 MG TABS tablet, Take 1 tablet (20 mg total) by mouth daily with supper., Disp: 30 tablet, Rfl: 0 .  traMADol (ULTRAM) 50 MG tablet, Take 2 tablets (100 mg total) by mouth every 6 (six) hours as needed. (Patient taking differently: Take 100 mg by mouth every 6 (six) hours as needed for moderate pain. ), Disp: 60 tablet, Rfl: 1 .  docusate sodium (COLACE) 100 MG capsule, Take 1 capsule (100 mg total) by mouth 2 (two) times daily as needed. (Patient not taking: Reported on 10/04/2018), Disp: 30 capsule, Rfl: 2 .  HYDROcodone-acetaminophen (NORCO) 5-325 MG tablet, Take 1 tablet by mouth every 4 (four) hours as needed for moderate pain. (Patient not taking: Reported on 10/04/2018), Disp: 6 tablet, Rfl: 0 .  lipase/protease/amylase (CREON) 36000 UNITS CPEP capsule, Take 2 capsules with each meal and 1 capsule with a snack (Patient not taking: Reported on 10/04/2018), Disp: 210 capsule, Rfl: 3  No Known  Allergies  I personally reviewed active problem list, medication list, allergies, health maintenance, notes from last encounter, lab results with the patient/caregiver today.   ROS  Ten systems reviewed and is negative except as mentioned in HPI   Objective  Virtual encounter, vitals not obtained.  There is no height or weight on file to calculate BMI.  Physical Exam Constitutional: Patient appears well-developed and well-nourished. No distress.  HENT: Head: Normocephalic and atraumatic.  Neck: Normal range of motion. Pulmonary/Chest: Effort normal. No respiratory distress. Speaking in complete sentences Neurological: Pt is alert and oriented to person, place, and time. Coordination, speech are normal.  Psychiatric: Patient  has a normal mood and affect. behavior is normal. Judgment and thought content normal.  No results found for this or any previous visit (from the past 72 hour(s)).  PHQ2/9: Depression screen PHQ 2/9 10/04/2018  Decreased Interest 0  Down, Depressed, Hopeless 0  PHQ - 2 Score 0  Altered sleeping 0  Tired, decreased energy 0  Change in appetite 0  Feeling bad or failure about yourself  0  Trouble concentrating 0  Moving slowly or fidgety/restless 0  Suicidal thoughts 0  PHQ-9 Score 0  Difficult doing work/chores Not difficult at all   PHQ-2/9 Result is negative.    Fall Risk: Fall Risk  10/04/2018  Falls in the past year? 0  Number falls in past yr: 0  Injury with Fall? 0    Assessment & Plan  1. Essential hypertension Stable on Amlodipine  2. History of partial thyroidectomy Stable - last labs 3 months prior are normal  3. Chronic pancreatitis, unspecified pancreatitis type ([place]) Seeing GI; colace PRN for constipation  4. Portal vein thrombosis Seeing GI and vein and vascular  5. Status post embolization of uterine artery Seeing vein and vascular  6. Symptomatic anemia Seeing GYN- planned hysterectomy  7. Pelvic pain Advised while  I will not provide any additional narcotic pain medication, I can offer a muscle relaxer to trial.  8. Anticoagulated - Seeing vein and vascular  I discussed the assessment and treatment plan with the patient. The patient was provided an opportunity to ask questions and all were answered. The patient agreed with the plan and demonstrated an understanding of the instructions.  The patient was advised to call back or seek an in-person evaluation if the symptoms worsen or if the condition fails to improve as anticipated.  I provided 22 minutes of non-face-to-face time during this encounter.

## 2018-10-04 NOTE — Telephone Encounter (Signed)
I have sent in a short supply of Hydrocodone until she is seen by the Orthopedist.  It was sent to the [facility] on [address].

## 2018-10-04 NOTE — Telephone Encounter (Signed)
Please inform that if she is taking Tramadol this frequently and has already used her refill, she may need something stronger to manage her pain (especially since she has a contra-indication to NSAIDs).  I see that the ER provider gave her a few tablets of Hydrocodone when she left the ER Monday, did those help? Also, we need to get her rescheduled for surgery.  I know that he should have an appointment with Ortho next week. Should we go ahead and see about adding her back to the schedule in the following week?  Lastly, for her bleeding, I can prescribe Aygestin 5 mg daily until surgery has been rescheduled.   Dr. Marcelline Mates

## 2018-10-04 NOTE — Telephone Encounter (Signed)
Spoke with patient and she is scheduled for her surgery on 10/28/2018. The ER gave her 6 tablets of the Hydrocodone. This did help with the pain. Ortho appointment is next week.She would also like to try the Aygestin 5 mg for bleeding. I have sent this this to the pharmacy for you.

## 2018-10-04 NOTE — Telephone Encounter (Signed)
Patient is requesting refill of her Tramadol. She states that she is taking medication every 4 hours. Patient stated that she is bleeding heavy and pain has been worse since Wednesday.

## 2018-10-04 NOTE — Telephone Encounter (Signed)
Patient called stating needs a refill on Tramadol sent to [facility] on [address]. Thanks

## 2018-10-04 NOTE — Telephone Encounter (Signed)
LM for patient that prescriptions have been sent to the pharmacy.

## 2018-10-04 NOTE — Telephone Encounter (Signed)
Pt called to ask who called her from the office. Notified pt that nurse reached out to pt to confirm Meds are at pharmacy. Please advise.

## 2018-10-15 ENCOUNTER — Telehealth: Payer: Self-pay | Admitting: Obstetrics and Gynecology

## 2018-10-15 NOTE — Telephone Encounter (Signed)
The patient called and stated that she needs to speak with Dr. Marcelline Mates or her nurse in regards to her needing to obtain the contact number to schedule an appointment for her "hip". Please advise.

## 2018-10-15 NOTE — Telephone Encounter (Signed)
Spoke with pt. Pt was given the information to Dr. Jenny Reichmann [place]. [facility] phone number [phone]. Pt was advised to call as soon as possible to schedule that appointment to prevent any delays in with her surgery.

## 2018-10-20 ENCOUNTER — Emergency Department
Admission: EM | Admit: 2018-10-20 | Discharge: 2018-10-20 | Disposition: A | Discharge status: Home / Self Care | Payer: Medicaid Other | Attending: Emergency Medicine | Admitting: Emergency Medicine

## 2018-10-20 ENCOUNTER — Other Ambulatory Visit: Payer: Self-pay

## 2018-10-20 ENCOUNTER — Encounter: Payer: Self-pay | Admitting: Emergency Medicine

## 2018-10-20 DIAGNOSIS — R109 Unspecified abdominal pain: Secondary | ICD-10-CM | POA: Diagnosis present

## 2018-10-20 DIAGNOSIS — Z5321 Procedure and treatment not carried out due to patient leaving prior to being seen by health care provider: Secondary | ICD-10-CM | POA: Insufficient documentation

## 2018-10-20 LAB — COMPREHENSIVE METABOLIC PANEL
ALT: 13 U/L (ref 0–44)
AST: 16 U/L (ref 15–41)
Albumin: 4.2 g/dL (ref 3.5–5.0)
Alkaline Phosphatase: 121 U/L (ref 38–126)
Anion gap: 11 (ref 5–15)
BUN: 13 mg/dL (ref 6–20)
CO2: 23 mmol/L (ref 22–32)
Calcium: 9.6 mg/dL (ref 8.9–10.3)
Chloride: 105 mmol/L (ref 98–111)
Creatinine, Ser: 0.76 mg/dL (ref 0.44–1.00)
GFR calc Af Amer: >60 mL/min (ref >60)
GFR calc non Af Amer: >60 mL/min (ref >60)
Glucose, Bld: 120 mg/dL — ABNORMAL HIGH (ref 70–99)
Potassium: 3.3 mmol/L — ABNORMAL LOW (ref 3.5–5.1)
Sodium: 139 mmol/L (ref 135–145)
Total Bilirubin: 0.5 mg/dL (ref 0.3–1.2)
Total Protein: 8.8 g/dL — ABNORMAL HIGH (ref 6.5–8.1)

## 2018-10-20 LAB — LIPASE, BLOOD: Lipase: 21 U/L (ref 11–51)

## 2018-10-20 LAB — CBC
HCT: 39.4 % (ref 36.0–46.0)
Hemoglobin: 11.8 g/dL — ABNORMAL LOW (ref 12.0–15.0)
MCH: 22 pg — ABNORMAL LOW (ref 26.0–34.0)
MCHC: 29.9 g/dL — ABNORMAL LOW (ref 30.0–36.0)
MCV: 73.5 fL — ABNORMAL LOW (ref 80.0–100.0)
Platelets: 415 10*3/uL — ABNORMAL HIGH (ref 150–400)
RBC: 5.36 MIL/uL — ABNORMAL HIGH (ref 3.87–5.11)
RDW: 19 % — ABNORMAL HIGH (ref 11.5–15.5)
WBC: 6.6 10*3/uL (ref 4.0–10.5)
nRBC: 0 % (ref 0.0–0.2)

## 2018-10-20 MED ORDER — SODIUM CHLORIDE 0.9% FLUSH: 3.0000 mL | Freq: Once | INTRAVENOUS | Status: DC

## 2018-10-20 NOTE — ED Triage Notes (Signed)
Pt to ED via [facility] from home for abdominal pain x 1 week. Pt that she has hx/o pancreatitis, uterine fibroids, and blood clots. Pt states that she has been vomiting as well. Pt is in NAD

## 2018-10-21 ENCOUNTER — Telehealth: Payer: Self-pay | Admitting: Emergency Medicine

## 2018-10-21 NOTE — Telephone Encounter (Signed)
Called patient due to lwot to inquire about condition and follow up plans. Has called her doctor and is awaiting call back.  Explained that lab results are available for her doctor to review.

## 2018-10-22 ENCOUNTER — Ambulatory Visit (INDEPENDENT_AMBULATORY_CARE_PROVIDER_SITE_OTHER): Payer: Medicaid Other | Admitting: Obstetrics and Gynecology

## 2018-10-22 ENCOUNTER — Other Ambulatory Visit: Payer: Self-pay

## 2018-10-22 ENCOUNTER — Encounter: Payer: Self-pay | Admitting: Obstetrics and Gynecology

## 2018-10-22 VITALS — BP 126/89 | HR 91 | Ht 67.0 in | Wt 166.1 lb

## 2018-10-22 DIAGNOSIS — N938 Other specified abnormal uterine and vaginal bleeding: Secondary | ICD-10-CM

## 2018-10-22 DIAGNOSIS — Z9889 Other specified postprocedural states: Secondary | ICD-10-CM

## 2018-10-22 DIAGNOSIS — Z01818 Encounter for other preprocedural examination: Secondary | ICD-10-CM

## 2018-10-22 DIAGNOSIS — E039 Hypothyroidism, unspecified: Secondary | ICD-10-CM

## 2018-10-22 DIAGNOSIS — R102 Pelvic and perineal pain: Secondary | ICD-10-CM

## 2018-10-22 DIAGNOSIS — Z86718 Personal history of other venous thrombosis and embolism: Secondary | ICD-10-CM

## 2018-10-22 DIAGNOSIS — K861 Other chronic pancreatitis: Secondary | ICD-10-CM

## 2018-10-22 DIAGNOSIS — D500 Iron deficiency anemia secondary to blood loss (chronic): Secondary | ICD-10-CM

## 2018-10-22 DIAGNOSIS — D250 Submucous leiomyoma of uterus: Secondary | ICD-10-CM

## 2018-10-22 DIAGNOSIS — I10 Essential (primary) hypertension: Secondary | ICD-10-CM

## 2018-10-22 DIAGNOSIS — D251 Intramural leiomyoma of uterus: Secondary | ICD-10-CM

## 2018-10-22 DIAGNOSIS — Z7901 Long term (current) use of anticoagulants: Secondary | ICD-10-CM

## 2018-10-22 MED ORDER — HYDROCODONE-ACETAMINOPHEN 5-325 MG PO TABS: 1.0000 | ORAL_TABLET | Freq: Four times a day (QID) | ORAL | 0 refills | Status: DC | PRN

## 2018-10-22 NOTE — H&P (View-Only) (Signed)
GYNECOLOGY PREOPERATIVE HISTORY AND PHYSICAL   Subjective:  Amy Wolfe is a 43 y.o. [id number] here for surgical management of pelvic pain, fibroid uterus, history of abnormal uterine bleeding, and anemia requiring blood transfusions.  She is s/p uterine artery embolization procedure in May 2020, however she has had persistent pelvic pain since this time. Significant preoperative concern is her history of anemia, last Hgb in June was 8.9 .  Also with chronic pancreatitis, history of blood clots on anti-coagulant therapy. Also with a PMH of HTN and hypothyroidism.   Of note, patient was scheduled for surgery earlier this month, however surgery was postponed due to hip injury.  Has been cleared by Orthopedics last week.   Proposed surgery: Laparoscopic- Assisted Vaginal Hysterectomy with bilateral salpingectomy.    Pertinent Gynecological History: Menses: flow is lighter since May, lasting 4-5 days.  Contraception: condoms (intermittently) Last mammogram: patient has never had one.   Last pap: normal, per patient, performed in [place] [employer]). Date: April 2020.    Past Medical History:  Diagnosis Date  . Abdominal pain   . Anxiety   . Coronary artery disease   . Dyspnea   . GERD (gastroesophageal reflux disease)   . H/O blood clots   . Hypertension   . Liver abscess   . Pancreatitis   . Thyroid disease   . Uterine fibroid     Past Surgical History:  Procedure Laterality Date  . CESAREAN SECTION    . EMBOLIZATION N/A 06/14/2018   Procedure: Uterine Embolization;  Surgeon: Algernon Huxley, MD;  Location: [facility];  Service: Cardiovascular;  Laterality: N/A;  . laceration finger Right   . THYROIDECTOMY, PARTIAL      OB History  Gravida Para Term Preterm AB Living  4 2 2   2 2   SAB TAB Ectopic Multiple Live Births  1 1     2     # Outcome Date GA Lbr Len/2nd Weight Sex Delivery Anes PTL Lv  4 TAB           3 SAB           2 Term           1 Term              Family History  Problem Relation Age of Onset  . Hypertension Mother   . Diabetes Mother   . Cancer Father   . Cancer Paternal Grandmother   . Cancer Paternal Grandfather   . Aneurysm Brother     Social History   Socioeconomic History  . Marital status: Single    Spouse name: Not on file  . Number of children: 2  . Years of education: 39  . Highest education level: Associate degree: academic program  Occupational History  . Occupation: [employer]  Social Needs  . Financial resource strain: Not hard at all  . Food insecurity    Worry: Never true    Inability: Never true  . Transportation needs    Medical: No    Non-medical: No  Tobacco Use  . Smoking status: Current Every Day Smoker    Packs/day: 0.50    Years: 35.00    Pack years: 17.50    Types: Cigarettes  . Smokeless tobacco: Never Used  Substance and Sexual Activity  . Alcohol use: Not Currently  . Drug use: Yes    Types: Marijuana    Comment: daily  . Sexual activity: Not Currently  Lifestyle  .  Physical activity    Days per week: 0 days    Minutes per session: 0 min  . Stress: Only a little  Relationships  . Social connections    Talks on phone: More than three times a week    Gets together: More than three times a week    Attends religious service: Never    Active member of club or organization: No    Attends meetings of clubs or organizations: Never    Relationship status: Not on file  . Intimate partner violence    Fear of current or ex partner: No    Emotionally abused: No    Physically abused: No    Forced sexual activity: No  Other Topics Concern  . Not on file  Social History Narrative  . Not on file    Current Outpatient Medications on File Prior to Visit  Medication Sig Dispense Refill  . amLODipine (NORVASC) 10 MG tablet Take 1 tablet (10 mg total) by mouth daily. (Patient taking differently: Take 10 mg by mouth at bedtime. ) 90 tablet 0  . docusate sodium (COLACE) 100 MG  capsule Take 1 capsule (100 mg total) by mouth 2 (two) times daily as needed. (Patient taking differently: Take 100 mg by mouth See admin instructions. Take 1 capsule (100 mg) by mouth twice daily every other day.) 30 capsule 2  . ferrous sulfate (FERROUSUL) 325 (65 FE) MG tablet Take 1 tablet (325 mg total) by mouth 2 (two) times daily. (Patient taking differently: Take 325 mg by mouth every evening. ) 60 tablet 1  . lipase/protease/amylase (CREON) 36000 UNITS CPEP capsule Take 2 capsules with each meal and 1 capsule with a snack (Patient taking differently: Take 36,000-72,000 Units by mouth See admin instructions. Take 2 capsules (72000 units) with each meal & 1 capsule (36000 units) with each snack) 210 capsule 3  . pantoprazole (PROTONIX) 40 MG tablet Take 1 tablet (40 mg total) by mouth daily. (Patient taking differently: Take 40 mg by mouth at bedtime. ) 30 tablet 6  . rivaroxaban (XARELTO) 20 MG TABS tablet Take 1 tablet (20 mg total) by mouth daily with supper. 30 tablet 0  . acetaminophen-codeine (TYLENOL #3) 300-30 MG tablet Take 1-2 tablets by mouth every 4 (four) hours as needed for moderate pain. (Patient not taking: Reported on 10/21/2018) 30 tablet 1   No current facility-administered medications on file prior to visit.    No Known Allergies    Review of Systems Constitutional: No recent fever/chills/sweats Respiratory: No recent cough/bronchitis Cardiovascular: No chest pain Gastrointestinal: No recent nausea/vomiting/diarrhea Genitourinary: No UTI symptoms Hematologic/lymphatic:No history of coagulopathy or recent blood thinner use    Objective:   Blood pressure 126/89, pulse 91, height 5\' 7"  (1.702 m), weight 166 lb 1.6 oz (75.3 kg), last menstrual period 09/30/2018. CONSTITUTIONAL: Well-developed, well-nourished female in no acute distress.  HENT:  Normocephalic, atraumatic, External right and left ear normal. Oropharynx is clear and moist EYES: Conjunctivae and EOM are  normal. Pupils are equal, round, and reactive to light. No scleral icterus.  NECK: Normal range of motion, supple, no masses SKIN: Skin is warm and dry. No rash noted. Not diaphoretic. No erythema. No pallor. NEUROLOGIC: Alert and oriented to person, place, and time. Normal reflexes, muscle tone coordination. No cranial nerve deficit noted. PSYCHIATRIC: Normal mood and affect. Normal behavior. Normal judgment and thought content. CARDIOVASCULAR: Normal heart rate noted, regular rhythm RESPIRATORY: Effort and breath sounds normal, no problems with respiration noted ABDOMEN: Soft,  non-distended, mild to moderate tenderness in the lower abdomen. PELVIC: Deferred MUSCULOSKELETAL: Normal range of motion. No edema and no tenderness. 2+ distal pulses.    Labs: Results for orders placed or performed during the hospital encounter of 10/20/18 (from the past 336 hour(s))  Lipase, blood   Collection Time: 10/20/18  5:02 PM  Result Value Ref Range   Lipase 21 11 - 51 U/L  Comprehensive metabolic panel   Collection Time: 10/20/18  5:02 PM  Result Value Ref Range   Sodium 139 135 - 145 mmol/L   Potassium 3.3 (L) 3.5 - 5.1 mmol/L   Chloride 105 98 - 111 mmol/L   CO2 23 22 - 32 mmol/L   Glucose, Bld 120 (H) 70 - 99 mg/dL   BUN 13 6 - 20 mg/dL   Creatinine, Ser 0.76 0.44 - 1.00 mg/dL   Calcium 9.6 8.9 - 10.3 mg/dL   Total Protein 8.8 (H) 6.5 - 8.1 g/dL   Albumin 4.2 3.5 - 5.0 g/dL   AST 16 15 - 41 U/L   ALT 13 0 - 44 U/L   Alkaline Phosphatase 121 38 - 126 U/L   Total Bilirubin 0.5 0.3 - 1.2 mg/dL   GFR calc non Af Amer >60 >60 mL/min   GFR calc Af Amer >60 >60 mL/min   Anion gap 11 5 - 15  CBC   Collection Time: 10/20/18  5:02 PM  Result Value Ref Range   WBC 6.6 4.0 - 10.5 K/uL   RBC 5.36 (H) 3.87 - 5.11 MIL/uL   Hemoglobin 11.8 (L) 12.0 - 15.0 g/dL   HCT 39.4 36.0 - 46.0 %   MCV 73.5 (L) 80.0 - 100.0 fL   MCH 22.0 (L) 26.0 - 34.0 pg   MCHC 29.9 (L) 30.0 - 36.0 g/dL   RDW 19.0 (H)  11.5 - 15.5 %   Platelets 415 (H) 150 - 400 K/uL   nRBC 0.0 0.0 - 0.2 %     Imaging Studies: Patient Name: Anaijah Seman DOB: Feb 14, 1975 MRN: [id number] ULTRASOUND REPORT  Location: [facility]  Date of Service: 09/04/2018     Indications:Enlarged Uterus Findings:  The uterus is anteverted and measures 11.7 x 6.0 x 9.0 cm. Echo texture is heterogenous with evidence of focal masses. Within the uterus are multiple suspected fibroids measuring: Fibroid 1:Anterior RT IM 3.2 x 3.1 x 3.2 cm Fibroid 2:LT pedunculated 3.3 x 2.6 x 3.2 cm. Fibroid 3: Posterior pedunculated 3.2 x 3.2 x 3.2 cm Fibroid 4. Posterior M 1.8 x 1.8 x 1.8 cm Fibroid 5. Posterior Rt IM 3.2 x 3.2 x 3.4 cm  The Endometrium measures 13 mm.  Right Ovary measures not seen  Left Ovary measures not seen  Survey of the adnexa demonstrates no adnexal masses. There is no free fluid in the cul de sac.  Impression: 1. Enlarge uterus due to multiple fibroids as described above. 2. Ovaries not seen due to fibroids.  Recommendations: 1.Clinical correlation with the patient's History and Physical Exam.   Jenine M. Albertine Grates    RDMS  Assessment:    Pelvic pain Fibroid uterus S/p uterine fibroid embolization Dysfunctional uterine bleeding History of anemia  HTN Hypothyroidism H/o blood clots on anticoagulation Chronic pancreatitis   Plan:    Counseling: Procedure, risks, reasons, benefits and complications (including injury to bowel, bladder, major blood vessel, ureter, bleeding, possibility of transfusion, infection, or fistula formation) reviewed in detail. Likelihood of success in alleviating the patient's condition was discussed. Routine postoperative instructions will  be reviewed with the patient and her family in detail after surgery.  The patient concurred with the proposed plan, giving informed written consent for the surgery.  Aware of need for COVID testing.  Preop testing previously ordered.   Patient reports that she has not taken her Xarelto since Saturday (states she initially forgot several doses due to pain). Will continue to hold until surgery date.  Refill given on Norco until surgery date next week.  Instructions reviewed, including NPO after midnight.      Rubie Maid, MD [facility]

## 2018-10-22 NOTE — Progress Notes (Signed)
Pt is present for pre-op. Pt stated that she is still in a lot of pain all the time.

## 2018-10-22 NOTE — Progress Notes (Signed)
GYNECOLOGY PREOPERATIVE HISTORY AND PHYSICAL   Subjective:  Amy Wolfe is a 43 y.o. [id number] here for surgical management of pelvic pain, fibroid uterus, history of abnormal uterine bleeding, and anemia requiring blood transfusions.  She is s/p uterine artery embolization procedure in May 2020, however she has had persistent pelvic pain since this time. Significant preoperative concern is her history of anemia, last Hgb in June was 8.9 .  Also with chronic pancreatitis, history of blood clots on anti-coagulant therapy. Also with a PMH of HTN and hypothyroidism.   Of note, patient was scheduled for surgery earlier this month, however surgery was postponed due to hip injury.  Has been cleared by Orthopedics last week.   Proposed surgery: Laparoscopic- Assisted Vaginal Hysterectomy with bilateral salpingectomy.    Pertinent Gynecological History: Menses: flow is lighter since May, lasting 4-5 days.  Contraception: condoms (intermittently) Last mammogram: patient has never had one.   Last pap: normal, per patient, performed in [place] [employer]). Date: April 2020.    Past Medical History:  Diagnosis Date  . Abdominal pain   . Anxiety   . Coronary artery disease   . Dyspnea   . GERD (gastroesophageal reflux disease)   . H/O blood clots   . Hypertension   . Liver abscess   . Pancreatitis   . Thyroid disease   . Uterine fibroid     Past Surgical History:  Procedure Laterality Date  . CESAREAN SECTION    . EMBOLIZATION N/A 06/14/2018   Procedure: Uterine Embolization;  Surgeon: Algernon Huxley, MD;  Location: [facility];  Service: Cardiovascular;  Laterality: N/A;  . laceration finger Right   . THYROIDECTOMY, PARTIAL      OB History  Gravida Para Term Preterm AB Living  4 2 2   2 2   SAB TAB Ectopic Multiple Live Births  1 1     2     # Outcome Date GA Lbr Len/2nd Weight Sex Delivery Anes PTL Lv  4 TAB           3 SAB           2 Term           1 Term              Family History  Problem Relation Age of Onset  . Hypertension Mother   . Diabetes Mother   . Cancer Father   . Cancer Paternal Grandmother   . Cancer Paternal Grandfather   . Aneurysm Brother     Social History   Socioeconomic History  . Marital status: Single    Spouse name: Not on file  . Number of children: 2  . Years of education: 37  . Highest education level: Associate degree: academic program  Occupational History  . Occupation: [employer]  Social Needs  . Financial resource strain: Not hard at all  . Food insecurity    Worry: Never true    Inability: Never true  . Transportation needs    Medical: No    Non-medical: No  Tobacco Use  . Smoking status: Current Every Day Smoker    Packs/day: 0.50    Years: 35.00    Pack years: 17.50    Types: Cigarettes  . Smokeless tobacco: Never Used  Substance and Sexual Activity  . Alcohol use: Not Currently  . Drug use: Yes    Types: Marijuana    Comment: daily  . Sexual activity: Not Currently  Lifestyle  .  Physical activity    Days per week: 0 days    Minutes per session: 0 min  . Stress: Only a little  Relationships  . Social connections    Talks on phone: More than three times a week    Gets together: More than three times a week    Attends religious service: Never    Active member of club or organization: No    Attends meetings of clubs or organizations: Never    Relationship status: Not on file  . Intimate partner violence    Fear of current or ex partner: No    Emotionally abused: No    Physically abused: No    Forced sexual activity: No  Other Topics Concern  . Not on file  Social History Narrative  . Not on file    Current Outpatient Medications on File Prior to Visit  Medication Sig Dispense Refill  . amLODipine (NORVASC) 10 MG tablet Take 1 tablet (10 mg total) by mouth daily. (Patient taking differently: Take 10 mg by mouth at bedtime. ) 90 tablet 0  . docusate sodium (COLACE) 100 MG  capsule Take 1 capsule (100 mg total) by mouth 2 (two) times daily as needed. (Patient taking differently: Take 100 mg by mouth See admin instructions. Take 1 capsule (100 mg) by mouth twice daily every other day.) 30 capsule 2  . ferrous sulfate (FERROUSUL) 325 (65 FE) MG tablet Take 1 tablet (325 mg total) by mouth 2 (two) times daily. (Patient taking differently: Take 325 mg by mouth every evening. ) 60 tablet 1  . lipase/protease/amylase (CREON) 36000 UNITS CPEP capsule Take 2 capsules with each meal and 1 capsule with a snack (Patient taking differently: Take 36,000-72,000 Units by mouth See admin instructions. Take 2 capsules (72000 units) with each meal & 1 capsule (36000 units) with each snack) 210 capsule 3  . pantoprazole (PROTONIX) 40 MG tablet Take 1 tablet (40 mg total) by mouth daily. (Patient taking differently: Take 40 mg by mouth at bedtime. ) 30 tablet 6  . rivaroxaban (XARELTO) 20 MG TABS tablet Take 1 tablet (20 mg total) by mouth daily with supper. 30 tablet 0  . acetaminophen-codeine (TYLENOL #3) 300-30 MG tablet Take 1-2 tablets by mouth every 4 (four) hours as needed for moderate pain. (Patient not taking: Reported on 10/21/2018) 30 tablet 1   No current facility-administered medications on file prior to visit.    No Known Allergies    Review of Systems Constitutional: No recent fever/chills/sweats Respiratory: No recent cough/bronchitis Cardiovascular: No chest pain Gastrointestinal: No recent nausea/vomiting/diarrhea Genitourinary: No UTI symptoms Hematologic/lymphatic:No history of coagulopathy or recent blood thinner use    Objective:   Blood pressure 126/89, pulse 91, height 5\' 7"  (1.702 m), weight 166 lb 1.6 oz (75.3 kg), last menstrual period 09/30/2018. CONSTITUTIONAL: Well-developed, well-nourished female in no acute distress.  HENT:  Normocephalic, atraumatic, External right and left ear normal. Oropharynx is clear and moist EYES: Conjunctivae and EOM are  normal. Pupils are equal, round, and reactive to light. No scleral icterus.  NECK: Normal range of motion, supple, no masses SKIN: Skin is warm and dry. No rash noted. Not diaphoretic. No erythema. No pallor. NEUROLOGIC: Alert and oriented to person, place, and time. Normal reflexes, muscle tone coordination. No cranial nerve deficit noted. PSYCHIATRIC: Normal mood and affect. Normal behavior. Normal judgment and thought content. CARDIOVASCULAR: Normal heart rate noted, regular rhythm RESPIRATORY: Effort and breath sounds normal, no problems with respiration noted ABDOMEN: Soft,  non-distended, mild to moderate tenderness in the lower abdomen. PELVIC: Deferred MUSCULOSKELETAL: Normal range of motion. No edema and no tenderness. 2+ distal pulses.    Labs: Results for orders placed or performed during the hospital encounter of 10/20/18 (from the past 336 hour(s))  Lipase, blood   Collection Time: 10/20/18  5:02 PM  Result Value Ref Range   Lipase 21 11 - 51 U/L  Comprehensive metabolic panel   Collection Time: 10/20/18  5:02 PM  Result Value Ref Range   Sodium 139 135 - 145 mmol/L   Potassium 3.3 (L) 3.5 - 5.1 mmol/L   Chloride 105 98 - 111 mmol/L   CO2 23 22 - 32 mmol/L   Glucose, Bld 120 (H) 70 - 99 mg/dL   BUN 13 6 - 20 mg/dL   Creatinine, Ser 0.76 0.44 - 1.00 mg/dL   Calcium 9.6 8.9 - 10.3 mg/dL   Total Protein 8.8 (H) 6.5 - 8.1 g/dL   Albumin 4.2 3.5 - 5.0 g/dL   AST 16 15 - 41 U/L   ALT 13 0 - 44 U/L   Alkaline Phosphatase 121 38 - 126 U/L   Total Bilirubin 0.5 0.3 - 1.2 mg/dL   GFR calc non Af Amer >60 >60 mL/min   GFR calc Af Amer >60 >60 mL/min   Anion gap 11 5 - 15  CBC   Collection Time: 10/20/18  5:02 PM  Result Value Ref Range   WBC 6.6 4.0 - 10.5 K/uL   RBC 5.36 (H) 3.87 - 5.11 MIL/uL   Hemoglobin 11.8 (L) 12.0 - 15.0 g/dL   HCT 39.4 36.0 - 46.0 %   MCV 73.5 (L) 80.0 - 100.0 fL   MCH 22.0 (L) 26.0 - 34.0 pg   MCHC 29.9 (L) 30.0 - 36.0 g/dL   RDW 19.0 (H)  11.5 - 15.5 %   Platelets 415 (H) 150 - 400 K/uL   nRBC 0.0 0.0 - 0.2 %     Imaging Studies: Patient Name: Amy Wolfe DOB: 01-31-1975 MRN: [id number] ULTRASOUND REPORT  Location: [facility]  Date of Service: 09/04/2018     Indications:Enlarged Uterus Findings:  The uterus is anteverted and measures 11.7 x 6.0 x 9.0 cm. Echo texture is heterogenous with evidence of focal masses. Within the uterus are multiple suspected fibroids measuring: Fibroid 1:Anterior RT IM 3.2 x 3.1 x 3.2 cm Fibroid 2:LT pedunculated 3.3 x 2.6 x 3.2 cm. Fibroid 3: Posterior pedunculated 3.2 x 3.2 x 3.2 cm Fibroid 4. Posterior M 1.8 x 1.8 x 1.8 cm Fibroid 5. Posterior Rt IM 3.2 x 3.2 x 3.4 cm  The Endometrium measures 13 mm.  Right Ovary measures not seen  Left Ovary measures not seen  Survey of the adnexa demonstrates no adnexal masses. There is no free fluid in the cul de sac.  Impression: 1. Enlarge uterus due to multiple fibroids as described above. 2. Ovaries not seen due to fibroids.  Recommendations: 1.Clinical correlation with the patient's History and Physical Exam.   Jenine M. Albertine Grates    RDMS  Assessment:    Pelvic pain Fibroid uterus S/p uterine fibroid embolization Dysfunctional uterine bleeding History of anemia  HTN Hypothyroidism H/o blood clots on anticoagulation Chronic pancreatitis   Plan:    Counseling: Procedure, risks, reasons, benefits and complications (including injury to bowel, bladder, major blood vessel, ureter, bleeding, possibility of transfusion, infection, or fistula formation) reviewed in detail. Likelihood of success in alleviating the patient's condition was discussed. Routine postoperative instructions will  be reviewed with the patient and her family in detail after surgery.  The patient concurred with the proposed plan, giving informed written consent for the surgery.  Aware of need for COVID testing.  Preop testing previously ordered.   Patient reports that she has not taken her Xarelto since Saturday (states she initially forgot several doses due to pain). Will continue to hold until surgery date.  Refill given on Norco until surgery date next week.  Instructions reviewed, including NPO after midnight.      Rubie Maid, MD [facility]

## 2018-10-22 NOTE — Patient Instructions (Signed)
Preparing for Surgery Preparing for surgery is an important part of your care. It can make things go more smoothly and help you avoid complications. The steps leading up to surgery may vary among hospitals. Follow all instructions given to you by your health care providers. Ask questions if you do not understand something. Talk about any concerns that you have. Here are some questions to consider asking before your surgery:  If my surgery is not an emergency (is elective), when would be the best time to have the surgery?  What arrangements do I need to make for work, home, or school?  What will my recovery be like? How long will it be before I can return to normal activities?  Will I need to prepare my home? Will I need to arrange care for me or my children?  Should I expect to have pain after surgery? What are my pain management options? Are there nonmedical options that I can try for pain? Tell a health care provider about:   Any allergies you have.  All medicines you are taking, including vitamins, herbs, eye drops, creams, and over-the-counter medicines.  Any problems you or family members have had with anesthetic medicines.  Any blood disorders you have.  Any surgeries you have had.  Any medical conditions you have.  Whether you are pregnant or may be pregnant. What are the risks? The risks and complications of surgery depend on the specific procedure that you have. Discuss all the risks with your health care providers before your surgery. Ask about common surgical complications, which may include:  Infection.  Bleeding or a need for blood replacement (transfusion).  Allergic reactions to medicines.  Damage to surrounding nerves, tissues, or structures.  A blood clot.  Scarring.  Failure of the surgery to correct the problem. Follow these instructions before the procedure: Several days or weeks before your procedure  You may have a physical exam by your primary  health care provider to make sure it is safe for you to have surgery.  You may have testing. This may include a chest X-ray, blood and urine tests, electrocardiogram (ECG), or other testing.  Ask your health care provider about: ? Changing or stopping your regular medicines. This is especially important if you are taking diabetes medicines or blood thinners. ? Taking medicines such as aspirin and ibuprofen. These medicines can thin your blood. Do not take these medicines unless your health care provider tells you to take them. ? Taking over-the-counter medicines, vitamins, herbs, and supplements.  Do not use any products that contain nicotine or tobacco, such as cigarettes and e-cigarettes. If you need help quitting, ask your health care provider.  Avoid alcohol, or limit your alcohol intake to no more than 1 drink a day for nonpregnant women and 2 drinks a day for men. One drink equals 12 oz of beer, 5 oz of wine, or 1 oz of hard liquor.  Ask your health care provider if there are exercises you can do to prepare for surgery.  Eat a healthy diet. A healthy diet includes low-fat dairy products, low-fat (lean) meats, and fiber from whole grains, beans, and lots of fruits and vegetables.  Plan to have someone take you home from the hospital or clinic.  Plan to have a responsible adult care for you for at least 24 hours after you leave the hospital or clinic. This is important. The day before your procedure  You may be given antibiotic medicine to take by mouth to  help prevent infection. Take it as told by your health care provider.  You may be asked to shower with a germ-killing soap.  Follow instructions from your health care provider about eating and drinking restrictions. The day of your procedure  You may need to take another shower with a germ-killing soap before you leave home in the morning.  With a small sip of water, take only the medicines that you are told to take.  Do not  wear any makeup, nail polish, powder, deodorant, lotion, jewelry, hair accessories, or anything on your skin or body except your clothes.  If you will be staying in the hospital, bring a case to hold your glasses, contacts, or dentures. You may also want to bring your robe and non-skid footwear.  If instructed by your health care provider, bring your sleep apnea device with you on the day of your surgery (if this applies to you).  Arrive at the hospital as scheduled.  Bring a friend or family member with you who can help to answer questions and be present while you meet with your health care provider. At the hospital  When you arrive at the hospital, you will likely: ? Go to the reception desk to notify staff that you have arrived. ? Move to the preoperative and preparation areas before going to the operating room.  You may have to wear compression stockings. These help to prevent blood clots and reduce swelling in your legs.  An IV may be inserted into one of your veins.  In the operating room, you may be given one or more of the following: ? A medicine to help you relax (sedative). ? A medicine to numb the area (local anesthetic). ? A medicine to make you fall asleep (general anesthetic). ? A medicine that is injected into an area of your body to numb everything below the injection site (regional anesthetic).  Your surgical site will be marked or identified.  You may be given an antibiotic through your IV to help prevent infection. Contact a health care provider if you:  Develop a fever of more than 100.4F (38C) or other feelings of illness during the 48 hours before your surgery.  Have symptoms that get worse.  Have questions or concerns about your surgery. Summary  Preparing for surgery can make the procedure go more smoothly and lower your risk of complications.  Before surgery, make a list of questions and concerns to discuss with your surgeon. Ask about the risks and  possible complications.  In the days or weeks before your surgery, follow all instructions from your health care provider. You may need to stop smoking, avoid alcohol, follow eating restrictions, and change or stop your regular medicines.  Contact your surgeon if you develop a fever or other signs of illness during the few days before your surgery. This information is not intended to replace advice given to you by your health care provider. Make sure you discuss any questions you have with your health care provider. Document Released: 10/24/2016 Document Revised: 12/22/2016 Document Reviewed: 10/24/2016 Elsevier Patient Education  2020 Elsevier Inc.  

## 2018-10-24 ENCOUNTER — Other Ambulatory Visit: Payer: Self-pay

## 2018-10-24 ENCOUNTER — Other Ambulatory Visit
Admission: RE | Admit: 2018-10-24 | Discharge: 2018-10-24 | Disposition: A | Discharge status: Home / Self Care | Payer: Medicaid Other | Source: Ambulatory Visit | Attending: Obstetrics and Gynecology | Admitting: Obstetrics and Gynecology

## 2018-10-24 DIAGNOSIS — Z20828 Contact with and (suspected) exposure to other viral communicable diseases: Secondary | ICD-10-CM | POA: Diagnosis not present

## 2018-10-24 DIAGNOSIS — Z01812 Encounter for preprocedural laboratory examination: Secondary | ICD-10-CM | POA: Diagnosis present

## 2018-10-24 LAB — SARS CORONAVIRUS 2 (TAT 6-24 HRS): SARS Coronavirus 2: NEGATIVE

## 2018-10-25 ENCOUNTER — Telehealth: Payer: Self-pay | Admitting: Obstetrics and Gynecology

## 2018-10-25 NOTE — Telephone Encounter (Signed)
Pt aware per SMD and pre admit she is to be at [facility] at [facility] Monday. NPO after midnight.

## 2018-10-25 NOTE — Telephone Encounter (Signed)
Patient called the office to confirm the time of her surgery that is scheduled for Monday. Pt stated she has not heard anything yet from Pre-admit. Pt requesting a call back. Please advise.

## 2018-10-25 NOTE — Telephone Encounter (Signed)
Pt aware surgery is posted. LM with preadmit to call me. Will call pt back before EOD.

## 2018-10-27 MED ORDER — CEFAZOLIN SODIUM-DEXTROSE 2-4 GM/100ML-% IV SOLN
2.0000 g | INTRAVENOUS | Status: AC
  Administered 2018-10-28: 2 g via INTRAVENOUS

## 2018-10-28 ENCOUNTER — Other Ambulatory Visit: Payer: Self-pay

## 2018-10-28 ENCOUNTER — Encounter
Admission: RE | Disposition: A | Discharge status: Home / Self Care | Payer: Self-pay | Source: Home / Self Care | Attending: Obstetrics and Gynecology

## 2018-10-28 ENCOUNTER — Inpatient Hospital Stay
Admission: RE | Admit: 2018-10-28 | Discharge: 2018-10-30 | DRG: 742 | Disposition: A | Discharge status: Home / Self Care | Payer: Medicaid Other | Attending: Obstetrics and Gynecology | Admitting: Obstetrics and Gynecology

## 2018-10-28 ENCOUNTER — Ambulatory Visit: Payer: Medicaid Other | Admitting: Certified Registered"

## 2018-10-28 DIAGNOSIS — D250 Submucous leiomyoma of uterus: Principal | ICD-10-CM | POA: Diagnosis present

## 2018-10-28 DIAGNOSIS — Z9889 Other specified postprocedural states: Secondary | ICD-10-CM

## 2018-10-28 DIAGNOSIS — D251 Intramural leiomyoma of uterus: Secondary | ICD-10-CM

## 2018-10-28 DIAGNOSIS — N72 Inflammatory disease of cervix uteri: Secondary | ICD-10-CM

## 2018-10-28 DIAGNOSIS — E039 Hypothyroidism, unspecified: Secondary | ICD-10-CM | POA: Diagnosis present

## 2018-10-28 DIAGNOSIS — I81 Portal vein thrombosis: Secondary | ICD-10-CM

## 2018-10-28 DIAGNOSIS — I10 Essential (primary) hypertension: Secondary | ICD-10-CM | POA: Diagnosis present

## 2018-10-28 DIAGNOSIS — N888 Other specified noninflammatory disorders of cervix uteri: Secondary | ICD-10-CM

## 2018-10-28 DIAGNOSIS — N7001 Acute salpingitis: Secondary | ICD-10-CM

## 2018-10-28 DIAGNOSIS — N939 Abnormal uterine and vaginal bleeding, unspecified: Secondary | ICD-10-CM

## 2018-10-28 DIAGNOSIS — K658 Other peritonitis: Secondary | ICD-10-CM

## 2018-10-28 DIAGNOSIS — D500 Iron deficiency anemia secondary to blood loss (chronic): Secondary | ICD-10-CM | POA: Diagnosis present

## 2018-10-28 DIAGNOSIS — D252 Subserosal leiomyoma of uterus: Secondary | ICD-10-CM | POA: Diagnosis not present

## 2018-10-28 DIAGNOSIS — N800 Endometriosis of uterus: Secondary | ICD-10-CM

## 2018-10-28 DIAGNOSIS — Z90710 Acquired absence of both cervix and uterus: Secondary | ICD-10-CM | POA: Diagnosis present

## 2018-10-28 DIAGNOSIS — K219 Gastro-esophageal reflux disease without esophagitis: Secondary | ICD-10-CM | POA: Diagnosis present

## 2018-10-28 DIAGNOSIS — D509 Iron deficiency anemia, unspecified: Secondary | ICD-10-CM

## 2018-10-28 DIAGNOSIS — K861 Other chronic pancreatitis: Secondary | ICD-10-CM

## 2018-10-28 DIAGNOSIS — F17210 Nicotine dependence, cigarettes, uncomplicated: Secondary | ICD-10-CM | POA: Diagnosis present

## 2018-10-28 DIAGNOSIS — Z7901 Long term (current) use of anticoagulants: Secondary | ICD-10-CM | POA: Diagnosis not present

## 2018-10-28 DIAGNOSIS — R102 Pelvic and perineal pain: Secondary | ICD-10-CM | POA: Diagnosis present

## 2018-10-28 DIAGNOSIS — Z86718 Personal history of other venous thrombosis and embolism: Secondary | ICD-10-CM

## 2018-10-28 DIAGNOSIS — D649 Anemia, unspecified: Secondary | ICD-10-CM

## 2018-10-28 HISTORY — PX: LAPAROSCOPIC VAGINAL HYSTERECTOMY WITH SALPINGECTOMY: SHX6680

## 2018-10-28 HISTORY — PX: VAGINAL HYSTERECTOMY: SHX2639

## 2018-10-28 LAB — COMPREHENSIVE METABOLIC PANEL
ALT: 11 U/L (ref 0–44)
AST: 17 U/L (ref 15–41)
Albumin: 3.2 g/dL — ABNORMAL LOW (ref 3.5–5.0)
Alkaline Phosphatase: 99 U/L (ref 38–126)
Anion gap: 9 (ref 5–15)
BUN: 12 mg/dL (ref 6–20)
CO2: 25 mmol/L (ref 22–32)
Calcium: 8.6 mg/dL — ABNORMAL LOW (ref 8.9–10.3)
Chloride: 102 mmol/L (ref 98–111)
Creatinine, Ser: 0.72 mg/dL (ref 0.44–1.00)
GFR calc Af Amer: >60 mL/min (ref >60)
GFR calc non Af Amer: >60 mL/min (ref >60)
Glucose, Bld: 185 mg/dL — ABNORMAL HIGH (ref 70–99)
Potassium: 3.7 mmol/L (ref 3.5–5.1)
Sodium: 136 mmol/L (ref 135–145)
Total Bilirubin: 0.3 mg/dL (ref 0.3–1.2)
Total Protein: 7.2 g/dL (ref 6.5–8.1)

## 2018-10-28 LAB — URINE DRUG SCREEN, QUALITATIVE (ARMC ONLY)
Amphetamines, Ur Screen: NOT DETECTED
Barbiturates, Ur Screen: NOT DETECTED
Benzodiazepine, Ur Scrn: NOT DETECTED
Cannabinoid 50 Ng, Ur ~~LOC~~: POSITIVE — AB
Cocaine Metabolite,Ur ~~LOC~~: NOT DETECTED
MDMA (Ecstasy)Ur Screen: NOT DETECTED
Methadone Scn, Ur: NOT DETECTED
Opiate, Ur Screen: POSITIVE — AB
Phencyclidine (PCP) Ur S: NOT DETECTED
Tricyclic, Ur Screen: NOT DETECTED

## 2018-10-28 LAB — POCT PREGNANCY, URINE: Preg Test, Ur: NEGATIVE

## 2018-10-28 LAB — TYPE AND SCREEN
ABO/RH(D): O POS
Antibody Screen: NEGATIVE

## 2018-10-28 SURGERY — HYSTERECTOMY, VAGINAL, LAPAROSCOPY-ASSISTED, WITH SALPINGECTOMY
Anesthesia: General | Laterality: Bilateral

## 2018-10-28 MED ORDER — SUGAMMADEX SODIUM 500 MG/5ML IV SOLN
INTRAVENOUS | Status: DC | PRN
  Administered 2018-10-28: 300 mg via INTRAVENOUS

## 2018-10-28 MED ORDER — ALBUTEROL SULFATE HFA 108 (90 BASE) MCG/ACT IN AERS
INHALATION_SPRAY | RESPIRATORY_TRACT | Status: DC | PRN
  Administered 2018-10-28: 4 via RESPIRATORY_TRACT

## 2018-10-28 MED ORDER — SODIUM CHLORIDE (PF) 0.9 % IJ SOLN
INTRAMUSCULAR | Status: AC
  Filled 2018-10-28: qty 50

## 2018-10-28 MED ORDER — AMLODIPINE BESYLATE 10 MG PO TABS
10.0000 mg | ORAL_TABLET | Freq: Every day | ORAL | Status: DC
  Administered 2018-10-29: 22:00:00 10 mg via ORAL
  Filled 2018-10-28 (×3): qty 1

## 2018-10-28 MED ORDER — FENTANYL CITRATE (PF) 100 MCG/2ML IJ SOLN
INTRAMUSCULAR | Status: AC
  Administered 2018-10-28: 25 ug via INTRAVENOUS
  Filled 2018-10-28: qty 2

## 2018-10-28 MED ORDER — MIDAZOLAM HCL 2 MG/2ML IJ SOLN
INTRAMUSCULAR | Status: AC
  Filled 2018-10-28: qty 2

## 2018-10-28 MED ORDER — PROPOFOL 10 MG/ML IV BOLUS
INTRAVENOUS | Status: AC
  Filled 2018-10-28: qty 40

## 2018-10-28 MED ORDER — HYDROMORPHONE HCL 1 MG/ML IJ SOLN
0.2500 mg | INTRAMUSCULAR | Status: DC | PRN
  Administered 2018-10-28 (×2): 0.25 mg via INTRAVENOUS

## 2018-10-28 MED ORDER — PROPOFOL 10 MG/ML IV BOLUS
INTRAVENOUS | Status: DC | PRN
  Administered 2018-10-28: 50 mg via INTRAVENOUS
  Administered 2018-10-28: 150 mg via INTRAVENOUS

## 2018-10-28 MED ORDER — KETOROLAC TROMETHAMINE 30 MG/ML IJ SOLN: 30.0000 mg | Freq: Four times a day (QID) | INTRAMUSCULAR | Status: DC

## 2018-10-28 MED ORDER — CELECOXIB 200 MG PO CAPS
400.0000 mg | ORAL_CAPSULE | ORAL | Status: AC
  Administered 2018-10-28: 400 mg via ORAL

## 2018-10-28 MED ORDER — SIMETHICONE 80 MG PO CHEW
80.0000 mg | CHEWABLE_TABLET | Freq: Four times a day (QID) | ORAL | Status: DC | PRN
  Administered 2018-10-28 – 2018-10-30 (×2): 80 mg via ORAL
  Filled 2018-10-28 (×2): qty 1

## 2018-10-28 MED ORDER — ONDANSETRON HCL 4 MG PO TABS: 4.0000 mg | ORAL_TABLET | Freq: Four times a day (QID) | ORAL | Status: DC | PRN

## 2018-10-28 MED ORDER — PROMETHAZINE HCL 25 MG/ML IJ SOLN
6.2500 mg | Freq: Once | INTRAMUSCULAR | Status: AC
  Administered 2018-10-28: 14:00:00 6.25 mg via INTRAVENOUS

## 2018-10-28 MED ORDER — LIDOCAINE HCL (CARDIAC) PF 100 MG/5ML IV SOSY
PREFILLED_SYRINGE | INTRAVENOUS | Status: DC | PRN
  Administered 2018-10-28: 100 mg via INTRAVENOUS

## 2018-10-28 MED ORDER — ESTROGENS, CONJUGATED 0.625 MG/GM VA CREA
TOPICAL_CREAM | VAGINAL | Status: AC
  Filled 2018-10-28: qty 30

## 2018-10-28 MED ORDER — BISACODYL 10 MG RE SUPP: 10.0000 mg | Freq: Every day | RECTAL | Status: DC | PRN

## 2018-10-28 MED ORDER — ACETAMINOPHEN 500 MG PO TABS
ORAL_TABLET | ORAL | Status: AC
  Filled 2018-10-28: qty 2

## 2018-10-28 MED ORDER — GABAPENTIN 300 MG PO CAPS
300.0000 mg | ORAL_CAPSULE | ORAL | Status: AC
  Administered 2018-10-28: 08:00:00 300 mg via ORAL

## 2018-10-28 MED ORDER — HYDROMORPHONE HCL 1 MG/ML IJ SOLN
INTRAMUSCULAR | Status: DC | PRN
  Administered 2018-10-28 (×2): 0.5 mg via INTRAVENOUS

## 2018-10-28 MED ORDER — MORPHINE SULFATE (PF) 4 MG/ML IV SOLN
4.0000 mg | Freq: Once | INTRAVENOUS | Status: AC
  Administered 2018-10-28: 16:00:00 4 mg via INTRAVENOUS
  Filled 2018-10-28: qty 1

## 2018-10-28 MED ORDER — ROCURONIUM BROMIDE 100 MG/10ML IV SOLN
INTRAVENOUS | Status: DC | PRN
  Administered 2018-10-28 (×3): 10 mg via INTRAVENOUS
  Administered 2018-10-28: 50 mg via INTRAVENOUS

## 2018-10-28 MED ORDER — ONDANSETRON HCL 4 MG/2ML IJ SOLN
4.0000 mg | Freq: Once | INTRAMUSCULAR | Status: AC
  Administered 2018-10-28: 14:00:00 4 mg via INTRAVENOUS

## 2018-10-28 MED ORDER — FENTANYL CITRATE (PF) 100 MCG/2ML IJ SOLN
25.0000 ug | INTRAMUSCULAR | Status: DC | PRN
  Administered 2018-10-28 (×4): 25 ug via INTRAVENOUS

## 2018-10-28 MED ORDER — FENTANYL CITRATE (PF) 100 MCG/2ML IJ SOLN
INTRAMUSCULAR | Status: DC | PRN
  Administered 2018-10-28 (×2): 50 ug via INTRAVENOUS

## 2018-10-28 MED ORDER — DEXAMETHASONE SODIUM PHOSPHATE 10 MG/ML IJ SOLN
INTRAMUSCULAR | Status: DC | PRN
  Administered 2018-10-28: 10 mg via INTRAVENOUS

## 2018-10-28 MED ORDER — ACETAMINOPHEN 500 MG PO TABS
1000.0000 mg | ORAL_TABLET | ORAL | Status: AC
  Administered 2018-10-28: 08:00:00 1000 mg via ORAL

## 2018-10-28 MED ORDER — LIDOCAINE HCL (PF) 2 % IJ SOLN
INTRAMUSCULAR | Status: AC
  Filled 2018-10-28: qty 10

## 2018-10-28 MED ORDER — SENNOSIDES-DOCUSATE SODIUM 8.6-50 MG PO TABS: 1.0000 | ORAL_TABLET | Freq: Every evening | ORAL | Status: DC | PRN

## 2018-10-28 MED ORDER — PANCRELIPASE (LIP-PROT-AMYL) 12000-38000 UNITS PO CPEP: 36000.0000 [IU] | ORAL_CAPSULE | ORAL | Status: DC

## 2018-10-28 MED ORDER — BUPIVACAINE HCL (PF) 0.5 % IJ SOLN
INTRAMUSCULAR | Status: AC
  Filled 2018-10-28: qty 30

## 2018-10-28 MED ORDER — PHENYLEPHRINE HCL (PRESSORS) 10 MG/ML IV SOLN
INTRAVENOUS | Status: DC | PRN
  Administered 2018-10-28 (×2): 100 ug via INTRAVENOUS

## 2018-10-28 MED ORDER — SODIUM CHLORIDE FLUSH 0.9 % IV SOLN
INTRAVENOUS | Status: AC
  Filled 2018-10-28: qty 10

## 2018-10-28 MED ORDER — ONDANSETRON HCL 4 MG/2ML IJ SOLN
INTRAMUSCULAR | Status: DC | PRN
  Administered 2018-10-28 (×2): 4 mg via INTRAVENOUS

## 2018-10-28 MED ORDER — PROMETHAZINE HCL 25 MG/ML IJ SOLN
INTRAMUSCULAR | Status: AC
  Administered 2018-10-28: 14:00:00 6.25 mg via INTRAVENOUS
  Filled 2018-10-28: qty 1

## 2018-10-28 MED ORDER — FENTANYL CITRATE (PF) 100 MCG/2ML IJ SOLN
INTRAMUSCULAR | Status: AC
  Filled 2018-10-28: qty 2

## 2018-10-28 MED ORDER — HYDROCODONE-ACETAMINOPHEN 5-325 MG PO TABS
1.0000 | ORAL_TABLET | ORAL | Status: DC | PRN
  Administered 2018-10-28 – 2018-10-30 (×4): 2 via ORAL
  Filled 2018-10-28 (×4): qty 2

## 2018-10-28 MED ORDER — PROMETHAZINE HCL 25 MG/ML IJ SOLN
25.0000 mg | Freq: Four times a day (QID) | INTRAMUSCULAR | Status: DC | PRN
  Administered 2018-10-29: 25 mg via INTRAVENOUS
  Filled 2018-10-28: qty 1

## 2018-10-28 MED ORDER — ONDANSETRON HCL 4 MG/2ML IJ SOLN
INTRAMUSCULAR | Status: AC
  Filled 2018-10-28: qty 2

## 2018-10-28 MED ORDER — PANTOPRAZOLE SODIUM 40 MG PO TBEC: 40.0000 mg | DELAYED_RELEASE_TABLET | Freq: Every day | ORAL | Status: DC

## 2018-10-28 MED ORDER — PANCRELIPASE (LIP-PROT-AMYL) 12000-38000 UNITS PO CPEP
72000.0000 [IU] | ORAL_CAPSULE | Freq: Three times a day (TID) | ORAL | Status: DC
  Administered 2018-10-29 – 2018-10-30 (×2): 72000 [IU] via ORAL
  Filled 2018-10-28 (×7): qty 6

## 2018-10-28 MED ORDER — VASOPRESSIN 20 UNIT/ML IV SOLN
INTRAVENOUS | Status: AC
  Filled 2018-10-28: qty 1

## 2018-10-28 MED ORDER — ENOXAPARIN SODIUM 40 MG/0.4ML ~~LOC~~ SOLN
40.0000 mg | SUBCUTANEOUS | Status: AC
  Administered 2018-10-28: 40 mg via SUBCUTANEOUS
  Filled 2018-10-28 (×2): qty 0.4

## 2018-10-28 MED ORDER — MIDAZOLAM HCL 2 MG/2ML IJ SOLN
INTRAMUSCULAR | Status: DC | PRN
  Administered 2018-10-28: 2 mg via INTRAVENOUS

## 2018-10-28 MED ORDER — FAMOTIDINE IN NACL 20-0.9 MG/50ML-% IV SOLN
20.0000 mg | Freq: Two times a day (BID) | INTRAVENOUS | Status: DC
  Administered 2018-10-28 – 2018-10-29 (×3): 20 mg via INTRAVENOUS
  Filled 2018-10-28 (×5): qty 50

## 2018-10-28 MED ORDER — PROMETHAZINE HCL 25 MG/ML IJ SOLN
25.0000 mg | Freq: Four times a day (QID) | INTRAMUSCULAR | Status: DC | PRN
  Administered 2018-10-28: 22:00:00 25 mg via INTRAVENOUS
  Filled 2018-10-28 (×2): qty 1

## 2018-10-28 MED ORDER — DOCUSATE SODIUM 100 MG PO CAPS
100.0000 mg | ORAL_CAPSULE | Freq: Two times a day (BID) | ORAL | Status: DC
  Administered 2018-10-29: 22:00:00 100 mg via ORAL
  Filled 2018-10-28: qty 1

## 2018-10-28 MED ORDER — MENTHOL 3 MG MT LOZG: 1.0000 | LOZENGE | OROMUCOSAL | Status: DC | PRN

## 2018-10-28 MED ORDER — MORPHINE SULFATE (PF) 4 MG/ML IV SOLN
4.0000 mg | INTRAVENOUS | Status: DC | PRN
  Administered 2018-10-28 – 2018-10-29 (×7): 4 mg via INTRAVENOUS
  Filled 2018-10-28 (×7): qty 1

## 2018-10-28 MED ORDER — ONDANSETRON HCL 4 MG/2ML IJ SOLN
INTRAMUSCULAR | Status: AC
  Administered 2018-10-28: 14:00:00 4 mg via INTRAVENOUS
  Filled 2018-10-28: qty 2

## 2018-10-28 MED ORDER — ONDANSETRON HCL 4 MG/2ML IJ SOLN
INTRAMUSCULAR | Status: AC
  Administered 2018-10-28: 13:00:00 4 mg via INTRAVENOUS
  Filled 2018-10-28: qty 2

## 2018-10-28 MED ORDER — ROCURONIUM BROMIDE 50 MG/5ML IV SOLN
INTRAVENOUS | Status: AC
  Filled 2018-10-28: qty 1

## 2018-10-28 MED ORDER — LACTATED RINGERS IV SOLN
INTRAVENOUS | Status: DC
  Administered 2018-10-28: 08:00:00 via INTRAVENOUS

## 2018-10-28 MED ORDER — HYDROMORPHONE HCL 1 MG/ML IJ SOLN
INTRAMUSCULAR | Status: AC
  Administered 2018-10-28: 14:00:00 0.25 mg via INTRAVENOUS
  Filled 2018-10-28: qty 1

## 2018-10-28 MED ORDER — CELECOXIB 200 MG PO CAPS
ORAL_CAPSULE | ORAL | Status: AC
  Filled 2018-10-28: qty 2

## 2018-10-28 MED ORDER — SCOPOLAMINE 1 MG/3DAYS TD PT72
1.0000 | MEDICATED_PATCH | TRANSDERMAL | Status: DC
  Administered 2018-10-28: 1.5 mg via TRANSDERMAL
  Filled 2018-10-28: qty 1

## 2018-10-28 MED ORDER — VASOPRESSIN 20 UNIT/ML IV SOLN
INTRAVENOUS | Status: DC | PRN
  Administered 2018-10-28: 11:00:00 20 mL via INTRAMUSCULAR

## 2018-10-28 MED ORDER — LACTATED RINGERS IV SOLN
INTRAVENOUS | Status: DC
  Administered 2018-10-28: 10:00:00 via INTRAVENOUS

## 2018-10-28 MED ORDER — PANCRELIPASE (LIP-PROT-AMYL) 12000-38000 UNITS PO CPEP
36000.0000 [IU] | ORAL_CAPSULE | ORAL | Status: DC
  Filled 2018-10-28 (×7): qty 3

## 2018-10-28 MED ORDER — EPHEDRINE SULFATE 50 MG/ML IJ SOLN
INTRAMUSCULAR | Status: DC | PRN
  Administered 2018-10-28 (×2): 5 mg via INTRAVENOUS

## 2018-10-28 MED ORDER — MAGNESIUM CITRATE PO SOLN: 1.0000 | Freq: Once | ORAL | Status: DC | PRN

## 2018-10-28 MED ORDER — PHENYLEPHRINE HCL (PRESSORS) 10 MG/ML IV SOLN
INTRAVENOUS | Status: AC
  Filled 2018-10-28: qty 1

## 2018-10-28 MED ORDER — HYDROMORPHONE HCL 1 MG/ML IJ SOLN
INTRAMUSCULAR | Status: AC
  Filled 2018-10-28: qty 1

## 2018-10-28 MED ORDER — ACETAMINOPHEN 500 MG PO TABS
ORAL_TABLET | ORAL | Status: AC
  Filled 2018-10-28: qty 1

## 2018-10-28 MED ORDER — DEXAMETHASONE SODIUM PHOSPHATE 10 MG/ML IJ SOLN
INTRAMUSCULAR | Status: AC
  Filled 2018-10-28: qty 1

## 2018-10-28 MED ORDER — LACTATED RINGERS IV SOLN
INTRAVENOUS | Status: DC
  Administered 2018-10-28 – 2018-10-29 (×3): via INTRAVENOUS

## 2018-10-28 MED ORDER — KETOROLAC TROMETHAMINE 30 MG/ML IJ SOLN
30.0000 mg | Freq: Four times a day (QID) | INTRAMUSCULAR | Status: DC
  Administered 2018-10-28 – 2018-10-30 (×7): 30 mg via INTRAVENOUS
  Filled 2018-10-28 (×7): qty 1

## 2018-10-28 MED ORDER — ONDANSETRON HCL 4 MG/2ML IJ SOLN
4.0000 mg | Freq: Four times a day (QID) | INTRAMUSCULAR | Status: DC | PRN
  Administered 2018-10-28: 20:00:00 4 mg via INTRAVENOUS
  Filled 2018-10-28 (×2): qty 2

## 2018-10-28 MED ORDER — GLYCOPYRROLATE 0.2 MG/ML IJ SOLN
INTRAMUSCULAR | Status: DC | PRN
  Administered 2018-10-28: 0.2 mg via INTRAVENOUS

## 2018-10-28 MED ORDER — CEFAZOLIN SODIUM-DEXTROSE 2-4 GM/100ML-% IV SOLN
INTRAVENOUS | Status: AC
  Filled 2018-10-28: qty 100

## 2018-10-28 MED ORDER — GABAPENTIN 100 MG PO CAPS
100.0000 mg | ORAL_CAPSULE | Freq: Three times a day (TID) | ORAL | Status: DC
  Administered 2018-10-28 – 2018-10-30 (×3): 100 mg via ORAL
  Filled 2018-10-28 (×3): qty 1

## 2018-10-28 MED ORDER — GABAPENTIN 300 MG PO CAPS
ORAL_CAPSULE | ORAL | Status: AC
  Filled 2018-10-28: qty 1

## 2018-10-28 MED ORDER — PROMETHAZINE HCL 25 MG/ML IJ SOLN
12.5000 mg | Freq: Once | INTRAMUSCULAR | Status: AC
  Administered 2018-10-28: 12.5 mg via INTRAVENOUS
  Filled 2018-10-28: qty 1

## 2018-10-28 MED ORDER — EPHEDRINE SULFATE 50 MG/ML IJ SOLN
INTRAMUSCULAR | Status: AC
  Filled 2018-10-28: qty 1

## 2018-10-28 MED ORDER — ONDANSETRON HCL 4 MG/2ML IJ SOLN
4.0000 mg | Freq: Once | INTRAMUSCULAR | Status: AC | PRN
  Administered 2018-10-28: 13:00:00 4 mg via INTRAVENOUS

## 2018-10-28 MED ORDER — ACETAMINOPHEN 500 MG PO TABS
1000.0000 mg | ORAL_TABLET | Freq: Four times a day (QID) | ORAL | Status: DC
  Administered 2018-10-29: 23:00:00 1000 mg via ORAL
  Filled 2018-10-28: qty 2

## 2018-10-28 MED ORDER — SODIUM CHLORIDE 0.9 % IV SOLN
8.0000 mg | Freq: Four times a day (QID) | INTRAVENOUS | Status: DC
  Administered 2018-10-28 – 2018-10-29 (×5): 8 mg via INTRAVENOUS
  Filled 2018-10-28: qty 4
  Filled 2018-10-28: qty 2
  Filled 2018-10-28 (×8): qty 4

## 2018-10-28 MED ORDER — MORPHINE SULFATE (PF) 2 MG/ML IV SOLN
1.0000 mg | INTRAVENOUS | Status: DC | PRN
  Administered 2018-10-28 (×2): 2 mg via INTRAVENOUS
  Filled 2018-10-28 (×2): qty 1

## 2018-10-28 MED ORDER — BUPIVACAINE HCL (PF) 0.5 % IJ SOLN
INTRAMUSCULAR | Status: DC | PRN
  Administered 2018-10-28: 3 mL

## 2018-10-28 MED ORDER — ALUM & MAG HYDROXIDE-SIMETH 200-200-20 MG/5ML PO SUSP: 30.0000 mL | ORAL | Status: DC | PRN

## 2018-10-28 MED ORDER — ZOLPIDEM TARTRATE 5 MG PO TABS
5.0000 mg | ORAL_TABLET | Freq: Every evening | ORAL | Status: DC | PRN
  Administered 2018-10-29: 23:00:00 5 mg via ORAL
  Filled 2018-10-28: qty 1

## 2018-10-28 MED ORDER — FLUMAZENIL 0.5 MG/5ML IV SOLN
INTRAVENOUS | Status: AC
  Administered 2018-10-28: 14:00:00 0.2 mg via INTRAVENOUS
  Filled 2018-10-28: qty 5

## 2018-10-28 MED ORDER — FLUMAZENIL 0.5 MG/5ML IV SOLN
0.2000 mg | Freq: Once | INTRAVENOUS | Status: AC
  Administered 2018-10-28: 14:00:00 0.2 mg via INTRAVENOUS

## 2018-10-28 MED ORDER — SUCCINYLCHOLINE CHLORIDE 20 MG/ML IJ SOLN
INTRAMUSCULAR | Status: AC
  Filled 2018-10-28: qty 1

## 2018-10-28 MED ORDER — GLYCOPYRROLATE 0.2 MG/ML IJ SOLN
INTRAMUSCULAR | Status: AC
  Filled 2018-10-28: qty 1

## 2018-10-28 MED ORDER — DEXMEDETOMIDINE HCL IN NACL 200 MCG/50ML IV SOLN
INTRAVENOUS | Status: DC | PRN
  Administered 2018-10-28: 4 ug via INTRAVENOUS

## 2018-10-28 SURGICAL SUPPLY — 75 items
BAG COUNTER SPONGE EZ (MISCELLANEOUS) ×2 IMPLANT
BAG URINE DRAINAGE (UROLOGICAL SUPPLIES) ×3 IMPLANT
BLADE SURG 15 STRL LF DISP TIS (BLADE) ×1 IMPLANT
BLADE SURG 15 STRL SS (BLADE) ×2
BLADE SURG SZ10 CARB STEEL (BLADE) ×3 IMPLANT
BLADE SURG SZ11 CARB STEEL (BLADE) ×3 IMPLANT
CANISTER SUCT 1200ML W/VALVE (MISCELLANEOUS) ×3 IMPLANT
CATH FOLEY 2WAY  5CC 16FR (CATHETERS) ×2
CATH URTH 16FR FL 2W BLN LF (CATHETERS) ×1 IMPLANT
CHLORAPREP W/TINT 26 (MISCELLANEOUS) ×3 IMPLANT
CLOSURE WOUND 1/2 X4 (GAUZE/BANDAGES/DRESSINGS) ×1
COUNTER SPONGE BAG EZ (MISCELLANEOUS) ×1
COVER WAND RF STERILE (DRAPES) ×3 IMPLANT
DERMABOND ADVANCED (GAUZE/BANDAGES/DRESSINGS) ×2
DERMABOND ADVANCED .7 DNX12 (GAUZE/BANDAGES/DRESSINGS) ×1 IMPLANT
DRAPE 3/4 80X56 (DRAPES) ×3 IMPLANT
DRAPE PERI LITHO V/GYN (MISCELLANEOUS) ×3 IMPLANT
ELECT CAUTERY BLADE 6.4 (BLADE) ×3 IMPLANT
ELECT REM PT RETURN 9FT ADLT (ELECTROSURGICAL) ×3
ELECTRODE REM PT RTRN 9FT ADLT (ELECTROSURGICAL) ×1 IMPLANT
GAUZE 4X4 16PLY RFD (DISPOSABLE) ×3 IMPLANT
GAUZE PACK 2X3YD (GAUZE/BANDAGES/DRESSINGS) ×3 IMPLANT
GLOVE BIO SURGEON STRL SZ 6.5 (GLOVE) ×6 IMPLANT
GLOVE BIO SURGEON STRL SZ8 (GLOVE) ×6 IMPLANT
GLOVE BIO SURGEONS STRL SZ 6.5 (GLOVE) ×3
GLOVE INDICATOR 7.0 STRL GRN (GLOVE) ×6 IMPLANT
GOWN STRL REUS W/ TWL LRG LVL3 (GOWN DISPOSABLE) ×4 IMPLANT
GOWN STRL REUS W/TWL LRG LVL3 (GOWN DISPOSABLE) ×8
HANDLE YANKAUER SUCT BULB TIP (MISCELLANEOUS) ×3 IMPLANT
IRRIGATION STRYKERFLOW (MISCELLANEOUS) ×1 IMPLANT
IRRIGATOR STRYKERFLOW (MISCELLANEOUS) ×3
IV LACTATED RINGERS 1000ML (IV SOLUTION) ×3 IMPLANT
KIT PINK PAD W/HEAD ARE REST (MISCELLANEOUS) ×3
KIT PINK PAD W/HEAD ARM REST (MISCELLANEOUS) ×1 IMPLANT
KIT TURNOVER CYSTO (KITS) ×3 IMPLANT
LABEL OR SOLS (LABEL) ×3 IMPLANT
LIGASURE VESSEL 5MM BLUNT TIP (ELECTROSURGICAL) IMPLANT
MANIPULATOR VCARE LG CRV RETR (MISCELLANEOUS) IMPLANT
MANIPULATOR VCARE SML CRV RETR (MISCELLANEOUS) IMPLANT
MANIPULATOR VCARE STD CRV RETR (MISCELLANEOUS) IMPLANT
NEEDLE HYPO 25GX1 SAFETY (NEEDLE) ×3 IMPLANT
NEEDLE SPNL 22GX3.5 QUINCKE BK (NEEDLE) ×3 IMPLANT
NS IRRIG 500ML POUR BTL (IV SOLUTION) IMPLANT
OCCLUDER COLPOPNEUMO (BALLOONS) IMPLANT
PACK BASIN MINOR ARMC (MISCELLANEOUS) ×3 IMPLANT
PACK GYN LAPAROSCOPIC (MISCELLANEOUS) ×3 IMPLANT
PAD OB MATERNITY 4.3X12.25 (PERSONAL CARE ITEMS) ×3 IMPLANT
PAD PREP 24X41 OB/GYN DISP (PERSONAL CARE ITEMS) ×3 IMPLANT
PENCIL SMOKE EVACUATOR (MISCELLANEOUS) ×3 IMPLANT
SCISSORS METZENBAUM CVD 33 (INSTRUMENTS) ×3 IMPLANT
SHEARS HARMONIC ACE PLUS 36CM (ENDOMECHANICALS) ×3 IMPLANT
SLEEVE ENDOPATH XCEL 5M (ENDOMECHANICALS) ×6 IMPLANT
SLEEVE SUCTION CATH 165 (SLEEVE) ×3 IMPLANT
SOL PREP PVP 2OZ (MISCELLANEOUS) ×3
SOLUTION ELECTROLUBE (MISCELLANEOUS) ×3 IMPLANT
SOLUTION PREP PVP 2OZ (MISCELLANEOUS) ×1 IMPLANT
STRIP CLOSURE SKIN 1/2X4 (GAUZE/BANDAGES/DRESSINGS) ×2 IMPLANT
SUT CHROMIC 0 CT 1 (SUTURE) ×6 IMPLANT
SUT CHROMIC 1-0 (SUTURE) ×3 IMPLANT
SUT CHROMIC 2 0 CT 1 (SUTURE) ×3 IMPLANT
SUT MNCRL 4-0 (SUTURE) ×2
SUT MNCRL 4-0 27XMFL (SUTURE) ×1
SUT VIC AB 0 CT1 27 (SUTURE) ×8
SUT VIC AB 0 CT1 27XCR 8 STRN (SUTURE) ×4 IMPLANT
SUT VIC AB 0 CT1 36 (SUTURE) ×3 IMPLANT
SUT VIC AB 0 CT2 27 (SUTURE) ×3 IMPLANT
SUT VIC AB 2-0 CT1 (SUTURE) ×3 IMPLANT
SUT VIC AB 4-0 FS2 27 (SUTURE) ×3 IMPLANT
SUTURE MNCRL 4-0 27XMF (SUTURE) ×1 IMPLANT
SYR 10ML LL (SYRINGE) ×3 IMPLANT
SYR 50ML LL SCALE MARK (SYRINGE) IMPLANT
SYR CONTROL 10ML LL (SYRINGE) ×3 IMPLANT
TAPE TRANSPORE STRL 2 31045 (GAUZE/BANDAGES/DRESSINGS) ×3 IMPLANT
TROCAR XCEL NON-BLD 5MMX100MML (ENDOMECHANICALS) ×3 IMPLANT
TUBING EVAC SMOKE HEATED PNEUM (TUBING) ×3 IMPLANT

## 2018-10-28 NOTE — Anesthesia Preprocedure Evaluation (Signed)
Anesthesia Evaluation  Patient identified by MRN, date of birth, ID band Patient awake    Reviewed: Allergy & Precautions, NPO status , Patient's Chart, lab work & pertinent test results  Airway Mallampati: III       Dental   Pulmonary shortness of breath and with exertion, Current Smoker,    Pulmonary exam normal        Cardiovascular hypertension, + CAD  Normal cardiovascular exam     Neuro/Psych Anxiety    GI/Hepatic GERD  ,  Endo/Other  Hypothyroidism   Renal/GU   Female GU complaint     Musculoskeletal   Abdominal Normal abdominal exam  (+)   Peds negative pediatric ROS (+)  Hematology  (+) anemia ,   Anesthesia Other Findings Past Medical History: No date: Abdominal pain No date: Anxiety No date: Coronary artery disease No date: Dyspnea No date: GERD (gastroesophageal reflux disease) No date: H/O blood clots No date: Hypertension No date: Liver abscess No date: Pancreatitis No date: Thyroid disease No date: Uterine fibroid  Reproductive/Obstetrics                             Anesthesia Physical Anesthesia Plan  ASA: III  Anesthesia Plan: General   Post-op Pain Management:    Induction: Intravenous  PONV Risk Score and Plan:   Airway Management Planned: Oral ETT  Additional Equipment:   Intra-op Plan:   Post-operative Plan: Extubation in OR  Informed Consent: I have reviewed the patients History and Physical, chart, labs and discussed the procedure including the risks, benefits and alternatives for the proposed anesthesia with the patient or authorized representative who has indicated his/her understanding and acceptance.     Dental advisory given  Plan Discussed with: CRNA and Surgeon  Anesthesia Plan Comments:         Anesthesia Quick Evaluation

## 2018-10-28 NOTE — Progress Notes (Signed)
Belching a lot  States she is sick  zofran given

## 2018-10-28 NOTE — Progress Notes (Signed)
Ch visited pt while rounding I pre-op. Pt is a 43 y.o. here for a HYSTERECTOMY. Pt reports that she has been in pain for about 6 months and had a menstrual cycle that lasted for 4 months leading to her needing blood transfusion. Pt works at a [employer] and is confident that she will be OK returning to work post-op in 2 weeks. Ch informed pt that if she needed support post-op to hv a nurse pg ch. Pt was appreciative of visit.    10/28/18 0900  Clinical Encounter Type  Visited With Patient  Visit Type Social support  Spiritual Encounters  Spiritual Needs Emotional;Grief support  Stress Factors  Patient Stress Factors Exhausted;Health changes;Major life changes  Family Stress Factors None identified

## 2018-10-28 NOTE — Op Note (Signed)
Procedure(s): LAPAROSCOPIC ASSISTED VAGINAL HYSTERECTOMY BILATERAL WITH SALPINGECTOMY  Procedure Note  Amy Wolfe female 43 y.o. 10/28/2018  Indications: The patient is a 43 y.o. [phone] female with fibroid uterus, pelvic pain, history of iron deficiency anemia, dysfunctional uterine bleeding, s/p uterine artery embolization in March 2020. Also with PMH of hypertension, hypothyroidism, history of blood clots on anticoagulation therapy, and chronic pancreatitis.   Pre-operative Diagnosis:   fibroid uterus, pelvic pain, history of iron deficiency anemia, dysfunctional uterine bleeding, s/p uterine artery embolization in March 2020.  Also with PMH of hypertension, hypothyroidism, history of blood clots on anticoagulation therapy, and chronic pancreatitis.   Post-operative Diagnosis: Same  Surgeon: Rubie Maid, MD  Assistants:  Jeannie Fend, MD No other capable assistant available in surgery requiring a high level assistant.  Charleston Ropes, [place] PA-S also present and scrubbed.   Anesthesia: General endotracheal anesthesia  Procedure Details: The patient was seen in the Holding Room. The risks, benefits, complications, treatment options, and expected outcomes were discussed with the patient.  The patient concurred with the proposed plan, giving informed consent.  The site of surgery properly noted/marked. The patient was taken to the Operating Room, identified as Amy Wolfe and the procedure verified as Procedure(s) (LRB): LAPAROSCOPIC ASSISTED VAGINAL HYSTERECTOMY BILATERAL WITH SALPINGECTOMY (Bilateral) HYSTERECTOMY VAGINAL WITH BILATERAL SALPINGECTOMY (Bilateral). A Time Out was held and the above information confirmed.  She was then placed under general anesthesia without difficulty. She was placed in the dorsal lithotomy position, and was prepped and draped in a sterile manner.  A Foley catheter was inserted into her bladder, and attached to constant drainage.  A uterine manipulator  was then advanced into the uterus .  After an adequate timeout was performed, attention was turned to the abdomen where an umbilical incision was made with the scalpel.  The Optiview 5-mm trocar and sleeve were then advanced without difficulty with the laparoscope under direct visualization into the abdomen.  The abdomen was then insufflated with carbon dioxide gas and adequate pneumoperitoneum was obtained.  Bilateral 5-mm lower quadrant ports were then placed under direct visualization.  A survey of the patient's pelvis and abdomen revealed the findings as above.  Attention was turned to the mesosalpinx and broad ligament on the patient's right side, which was clamped using the Harmonic scalpel and ligated. The utero-ovarian ligament was also ligated with the Harmonic scalpel working towards the round ligament.  The round and broad ligaments were then clamped and transected with the Harmonic scalpel.  The ureter were noted to be safely away from the area of dissection.  The uterine artery was then skeletonized and a bladder flap was created.  The bladder was then bluntly dissected off the lower uterine segment.  At this point, attention was turned to the right uterine vessels, which were clamped and ligated using the Harmonic scalpel.  Attention was then turned to the patient's left side, which was treated in a similar manner by taking the mesosalpinx, the utero-ovarian ligament, the round ligament and the broad ligaments and the bladder flap creation was completed. The left uterine vessels were also clamped and transected in a similar fashion. The laparoscope was used to survey the operative site, with good hemostasis noted throughout.  No intraoperative injury to other surrounding organs was noted.  The abdomen was desufflated and all instruments were then removed from the patient's abdomen.   All skin incisions were closed 4-0 Monocryl with Dermabond. The incisions were then injected with a total of 9 ml of  0.5% Sensorcaine.   Attention was then turned to her pelvis.  A weighted speculum was then placed in the vagina, and the anterior and posterior lips of the cervix were grasped bilaterally with tenaculums.  The cervix was then injected circumferentially with Petressin solution to maintain hemostasis.  The cervix was then circumferentially incised, and the posterior cul-de-sac was entered sharply without difficulty.  A long weighted speculum was inserted into the posterior cul-de-sac. The Heaney clamp was then used to clamp the uterosacral ligaments on either side.  They were then cut and sutured ligated with 0 Vicryl, and were held with a tag for later identification. Of note, all sutures used in this case were 0 Vicryl unless otherwise noted.   The cardinal ligaments were then clamped, cut and ligated bilaterally.  At this point, full entry into the anterior cul-de-sac was made, no injury to the bladder was noted.  The uterus, tubes and ovaries were freed from all ligaments and was then delivered and sent to pathology.  A modified McCall's culdoplasty stitch was performed. The vaginal cuff was then closed in a running locked fashion with 0 Vicryl with care given to incorporate the uterosacral pedicles bilaterally.  All instruments were then removed from the pelvis.   The patient tolerated the procedures well.  All instruments, needles, and sponge counts were correct  times three. The patient was taken to the recovery room awake, extubated and in stable condition.    Findings: The uterus was mobile, enlarged, with multiple leiomyoma visualized.  Fallopian tubes and ovaries appeared normal.   Estimated Blood Loss:  200 ml      Drains: foley catheterization prior to procedure with  350 ml of clear urine         Total IV Fluids: 1000 ml  Specimens: Uterus with cervix, bilateral fallopian tubes         Implants: None         Complications:  None; patient tolerated the procedure well.          Disposition: PACU - hemodynamically stable.         Condition: stable   Rubie Maid, MD [facility]

## 2018-10-28 NOTE — Anesthesia Procedure Notes (Signed)
Procedure Name: Intubation Performed by: Kelton Pillar, CRNA Pre-anesthesia Checklist: Patient identified, Emergency Drugs available, Suction available and Patient being monitored Patient Re-evaluated:Patient Re-evaluated prior to induction Oxygen Delivery Method: Circle system utilized Preoxygenation: Pre-oxygenation with 100% oxygen Induction Type: IV induction Ventilation: Mask ventilation without difficulty Laryngoscope Size: McGraph and 3 Grade View: Grade I Tube type: Oral Tube size: 6.5 mm Number of attempts: 1 Airway Equipment and Method: Stylet Placement Confirmation: ETT inserted through vocal cords under direct vision,  positive ETCO2,  breath sounds checked- equal and bilateral and CO2 detector Secured at: 21 cm Tube secured with: Tape Dental Injury: Teeth and Oropharynx as per pre-operative assessment

## 2018-10-28 NOTE — Progress Notes (Signed)
Nauseated   zofran given

## 2018-10-28 NOTE — Discharge Instructions (Signed)
Laparoscopically Assisted Vaginal Hysterectomy, Care After °This sheet gives you information about how to care for yourself after your procedure. Your health care provider may also give you more specific instructions. If you have problems or questions, contact your health care provider. °What can I expect after the procedure? °After the procedure, it is common to have: °· Soreness and numbness in your incision areas. °· Abdominal pain. You will be given pain medicine to control it. °· Vaginal bleeding and discharge. You will need to use a sanitary napkin after this procedure. °· Sore throat from the breathing tube that was inserted during surgery. °Follow these instructions at home: °Medicines °· Take over-the-counter and prescription medicines only as told by your health care provider. °· Do not take aspirin or ibuprofen. These medicines can cause bleeding. °· Do not drive or use heavy machinery while taking prescription pain medicine. °· Do not drive for 24 hours if you were given a medicine to help you relax (sedative) during the procedure. °Incision care ° °· Follow instructions from your health care provider about how to take care of your incisions. Make sure you: °? Wash your hands with soap and water before you change your bandage (dressing). If soap and water are not available, use hand sanitizer. °? Change your dressing as told by your health care provider. °? Leave stitches (sutures), skin glue, or adhesive strips in place. These skin closures may need to stay in place for 2 weeks or longer. If adhesive strip edges start to loosen and curl up, you may trim the loose edges. Do not remove adhesive strips completely unless your health care provider tells you to do that. °· Check your incision area every day for signs of infection. Check for: °? Redness, swelling, or pain. °? Fluid or blood. °? Warmth. °? Pus or a bad smell. °Activity °· Get regular exercise as told by your health care provider. You may be  told to take short walks every day and go farther each time. °· Return to your normal activities as told by your health care provider. Ask your health care provider what activities are safe for you. °· Do not douche, use tampons, or have sexual intercourse for at least 6 weeks, or until your health care provider gives you permission. °· Do not lift anything that is heavier than 10 lb (4.5 kg), or the limit that your health care provider tells you, until he or she says that it is safe. °General instructions °· Do not take baths, swim, or use a hot tub until your health care provider approves. Take showers instead of baths. °· Do not drive for 24 hours if you received a sedative. °· Do not drive or operate heavy machinery while taking prescription pain medicine. °· To prevent or treat constipation while you are taking prescription pain medicine, your health care provider may recommend that you: °? Drink enough fluid to keep your urine clear or pale yellow. °? Take over-the-counter or prescription medicines. °? Eat foods that are high in fiber, such as fresh fruits and vegetables, whole grains, and beans. °? Limit foods that are high in fat and processed sugars, such as fried and sweet foods. °· Keep all follow-up visits as told by your health care provider. This is important. °Contact a health care provider if: °· You have signs of infection, such as: °? Redness, swelling, or pain around your incision sites. °? Fluid or blood coming from an incision. °? An incision that feels warm to the   touch. °? Pus or a bad smell coming from an incision. °· Your incision breaks open. °· Your pain medicine is not helping. °· You feel dizzy or light-headed. °· You have pain or bleeding when you urinate. °· You have persistent nausea and vomiting. °· You have blood, pus, or a bad-smelling discharge from your vagina. °Get help right away if: °· You have a fever. °· You have severe abdominal pain. °· You have chest pain. °· You have  shortness of breath. °· You faint. °· You have pain, swelling, or redness in your leg. °· You have heavy bleeding from your vagina. °Summary °· After the procedure, it is common to have abdominal pain and vaginal bleeding. °· You should not drive or lift heavy objects until your health care provider says that it is safe. °· Contact your health care provider if you have any symptoms of infection, excessive vaginal bleeding, nausea, vomiting, or shortness of breath. °This information is not intended to replace advice given to you by your health care provider. Make sure you discuss any questions you have with your health care provider. °Document Released: 12/08/2010 Document Revised: 12/01/2016 Document Reviewed: 02/15/2016 °Elsevier Patient Education © 2020 Elsevier Inc. ° °

## 2018-10-28 NOTE — Anesthesia Post-op Follow-up Note (Signed)
Anesthesia QCDR form completed.        

## 2018-10-28 NOTE — Progress Notes (Signed)
Patient impulsively jumped up into shower and bed alarm going off. RN accompanied patient to shower and patient vomiting. Dr. Marcelline Mates called again for additional pain and nausea medications. Patient vomiting profusely in shower. Dr. Leonides Schanz overheard patient vomiting and assessed patient and tried to contact Dr. Marcelline Mates in [place] also. Dr. Leonides Schanz then gave verbal orders for patient to receive scopolamine patch, 25 mg phenergan, and IV pepcid. Medications given per MD order. Patient then accompanied back to bed and patient sleeping. Approximately one hour later Dr. Marcelline Mates came and saw patient and readjusted her medications and patient again jumped up into the shower shortly after, ripping her IV out and spilling emesis bag into bed. Patient finally helped back to bed again and new IV inserted and 4 mg morphine given and IVPB zofran hung. Patient again sleeping soundly and will continue to be monitored closely.

## 2018-10-28 NOTE — Progress Notes (Signed)
Patient in shower at arrival of RN's shift. Patient had NT at bedside and was hunched over in shower chair and seemed to be asleep in shower. RN asked patient to return to bed to get more comfortable and for safety reasons. Patient refused and told RN to leave her room. She stated that she was in 10/10 pain and that the pain medication she was given did not help and that she wished to remain in shower all night. RN told patient MD would be notified to discuss further pain medication options when patient returned to bed. Patient continued  to stay in shower appearing to be asleep and lethargic. RN called Dr. Marcelline Mates and left voicemail at this time. Patient continued to demand to stay in the shower. Shift coordinator notified of situation. Patient finally coaxed back to bed with 2 RN's accompanying and Morphine given for pain. Patient then began to vomit and dry heave. Zofran given at this time for vomiting. Patient now sleeping in bed with heating pad applied to back. Bed alarm on for safety purposes. Will continue to monitor.

## 2018-10-28 NOTE — Progress Notes (Signed)
Continues to have nausea   Another zofran given  Per dr Kayleen Memos    Vomited liquid approx 50

## 2018-10-28 NOTE — Interval H&P Note (Signed)
History and Physical Interval Note:  10/28/2018 8:42 AM  Amy Wolfe  has presented today for surgery, with the diagnosis of PELVIC PAIN, INTRAMURAL AND SUBMUCOUS LEIOMYOMA OF UTERUS, IRON DEFICIENCY ANEMIA DUE TO CHRONIC BLOOD LOSS, DUB, HYPERTENSION, HYPOTHYROIDISM, HISTORY OF BLOOD CLOTS, ANTICOAGULATED, CHRONIC PANCREATITIS.  The various methods of treatment have been discussed with the patient and family. After consideration of risks, benefits and other options for treatment, the patient has consented to  Procedure(s): LAPAROSCOPIC ASSISTED VAGINAL HYSTERECTOMY BILATERAL WITH SALPINGECTOMY (Bilateral) HYSTERECTOMY VAGINAL WITH BILATERAL SALPINGECTOMY (Bilateral) as a surgical intervention.  The patient's history has been reviewed, patient examined, no change in status, stable for surgery.  I have reviewed the patient's chart and labs.  Questions were answered to the patient's satisfaction.     Rubie Maid, MD [facility]

## 2018-10-28 NOTE — Progress Notes (Signed)
After arrival to the unit from PACU, pt started moaning and complaining of 10/10 pain. Pt very restless and wanting to get up to get in the shower to help with pain. IV toradol and morphine given with no relief. Dr. Marcelline Mates notified, orders received to allow patient to shower and to give her PRN norco. Pt up to shower, still moaning and complaining of 10/10 pain. Norco and gabapentin given. Pt. vomited about 5 minutes after pills given and pills were noted to be in the vomit. Dr. Marcelline Mates notified again and orders received to give 12.5mg  phenergan and 4mg  morphine. Pt. helped back to bed and then given medication with relief. Pt. now sleeping soundly. Will continue to monitor. Harriette Bouillon, RN

## 2018-10-28 NOTE — Transfer of Care (Addendum)
Immediate Anesthesia Transfer of Care Note  Patient: Amy Wolfe  Procedure(s) Performed: LAPAROSCOPIC ASSISTED VAGINAL HYSTERECTOMY BILATERAL WITH SALPINGECTOMY (Bilateral ) HYSTERECTOMY VAGINAL WITH BILATERAL SALPINGECTOMY (Bilateral )  Patient Location: PACU  Anesthesia Type:General  Level of Consciousness: drowsy, patient cooperative and responds to stimulation  Airway & Oxygen Therapy: Patient Spontanous Breathing and Patient connected to face mask oxygen  Post-op Assessment: Report given to RN and Post -op Vital signs reviewed and stable  Post vital signs: Reviewed and stable  Last Vitals:  Vitals Value Taken Time  BP 123/79 10/28/18 1222  Temp    Pulse 81 10/28/18 1224  Resp 18 10/28/18 1224  SpO2 100 % 10/28/18 1224  Vitals shown include unvalidated device data.  Last Pain:  Vitals:   10/28/18 0750  TempSrc: Temporal  PainSc: 0-No pain         Complications: Patient re-intubated

## 2018-10-28 NOTE — Progress Notes (Signed)
Received flumazenil to reverse versed   Continues to have nausea

## 2018-10-29 ENCOUNTER — Encounter: Payer: Self-pay | Admitting: Obstetrics and Gynecology

## 2018-10-29 LAB — CBC
HCT: 33.3 % — ABNORMAL LOW (ref 36.0–46.0)
Hemoglobin: 10.3 g/dL — ABNORMAL LOW (ref 12.0–15.0)
MCH: 22.1 pg — ABNORMAL LOW (ref 26.0–34.0)
MCHC: 30.9 g/dL (ref 30.0–36.0)
MCV: 71.5 fL — ABNORMAL LOW (ref 80.0–100.0)
Platelets: 299 10*3/uL (ref 150–400)
RBC: 4.66 MIL/uL (ref 3.87–5.11)
RDW: 18.7 % — ABNORMAL HIGH (ref 11.5–15.5)
WBC: 11.4 10*3/uL — ABNORMAL HIGH (ref 4.0–10.5)
nRBC: 0 % (ref 0.0–0.2)

## 2018-10-29 MED ORDER — ENOXAPARIN SODIUM 40 MG/0.4ML ~~LOC~~ SOLN
40.0000 mg | SUBCUTANEOUS | Status: DC
  Administered 2018-10-29: 12:00:00 40 mg via SUBCUTANEOUS
  Filled 2018-10-29: qty 0.4

## 2018-10-29 MED ORDER — RIVAROXABAN 20 MG PO TABS
20.0000 mg | ORAL_TABLET | Freq: Every day | ORAL | Status: DC
  Administered 2018-10-29: 22:00:00 20 mg via ORAL
  Filled 2018-10-29 (×2): qty 1

## 2018-10-29 NOTE — Anesthesia Postprocedure Evaluation (Signed)
Anesthesia Post Note  Patient: Amy Wolfe  Procedure(s) Performed: LAPAROSCOPIC ASSISTED VAGINAL HYSTERECTOMY BILATERAL WITH SALPINGECTOMY (Bilateral ) HYSTERECTOMY VAGINAL WITH BILATERAL SALPINGECTOMY (Bilateral )  Patient location during evaluation: PACU Anesthesia Type: General Level of consciousness: awake and alert and oriented Pain management: pain level controlled Vital Signs Assessment: post-procedure vital signs reviewed and stable Respiratory status: spontaneous breathing Cardiovascular status: blood pressure returned to baseline Anesthetic complications: no     Last Vitals:  Vitals:   10/29/18 0500 10/29/18 0834  BP:  (!) 137/93  Pulse: 80 79  Resp:  20  Temp:  36.7 C  SpO2: 99% 100%    Last Pain:  Vitals:   10/29/18 0834  TempSrc: Oral  PainSc:                  Laramie Meissner

## 2018-10-29 NOTE — Progress Notes (Signed)
Post-Operative Day/Hospital Day # 1, s/p LAVH with bilateral salpingectomy  Subjective: patient still with post-operative nausea and vomiting (due to anesthesia), however is starting to calm down. Is ambulating to the restroom.  Patient otherwise sleeping, not answering questions.  Per nurse, patient was up for most of the night due to the nausea/vomiting.  Voiding without difficulty   Objective: Temp:  [97.1 F (36.2 C)-98.6 F (37 C)] 98.1 F (36.7 C) (10/27 0834) Pulse Rate:  [63-94] 79 (10/27 0834) Resp:  [12-25] 20 (10/27 0834) BP: (112-150)/(65-97) 137/93 (10/27 0834) SpO2:  [94 %-100 %] 100 % (10/27 0834)  Physical Exam:  General: alert and no distress, resting Lungs: clear to auscultation bilaterally Heart: regular rate and rhythm, S1, S2 normal, no murmur, click, rub or gallop Abdomen: normal findings: bowel sounds normal, no masses palpable and soft. Appropriately tender.  Pelvis:Bleeding: appropriate,  Incision: healing well, no significant drainage, no dehiscence, no significant erythema Extremities: DVT Evaluation: No evidence of DVT seen on physical exam. Negative Homan's sign. No cords or calf tenderness. No significant calf/ankle edema.  WBC 4.0 - 10.5 K/uL 11.4(H) 6.6 6.1  Hemoglobin 12.0 - 15.0 g/dL 10.3(L) 11.8(L) 11.7(L)  Hematocrit 36.0 - 46.0 % 33.3(L) 39.4 39.4  Platelets 150 - 400 K/uL 299 415(H) 317     Lab Results  Component Value Date   CREATININE 0.72 10/28/2018   CREATININE 0.76 10/20/2018     Assessment/Plan: 1. Continue management of post-operative nausea and vomiting. Has Scopolamine patch, alternating Zofran and Phenergan scheduled. Will advance diet slowly.  2. Continue IVF until tolerating diet.  3. Encourage ambulation 4. Continue with daily Lovenox. Will need to resume Xarelto this evening.  5. Dispo: possible d/c home later today, or in a.m. depending on when resolution of post-operative nausea/vomiting resolves.      Rubie Maid, MD [facility]

## 2018-10-30 LAB — SURGICAL PATHOLOGY

## 2018-10-30 MED ORDER — HYDROCODONE-ACETAMINOPHEN 5-325 MG PO TABS: 1.0000 | ORAL_TABLET | Freq: Four times a day (QID) | ORAL | 0 refills | Status: DC | PRN

## 2018-10-30 MED ORDER — ACETAMINOPHEN ER 650 MG PO TBCR: 650.0000 mg | EXTENDED_RELEASE_TABLET | Freq: Three times a day (TID) | ORAL | 0 refills | Status: DC | PRN

## 2018-10-30 NOTE — Progress Notes (Signed)
DC inst reviewed with pt.  Verb u/o.  Reviewed meds called into pharmacy.

## 2018-10-30 NOTE — Progress Notes (Signed)
Post-Operative Day/Hospital Day # 2, s/p LAVH with bilateral salpingectomy  Subjective: no complaints, up ad lib, voiding, tolerating PO and + flatus.   Per nurse, patient was was caught smoking in bathroom.   Objective: Temp:  [97.9 F (36.6 C)-98.4 F (36.9 C)] 98.4 F (36.9 C) (10/28 0431) Pulse Rate:  [65-94] 65 (10/28 0431) Resp:  [18-20] 20 (10/28 0431) BP: (117-143)/(81-99) 119/84 (10/28 0431) SpO2:  [98 %-100 %] 99 % (10/28 0431)  Physical Exam:  General: alert and no distress,  Lungs: clear to auscultation bilaterally Heart: regular rate and rhythm, S1, S2 normal, no murmur, click, rub or gallop Abdomen: normal findings: bowel sounds normal, no masses palpable and soft. Appropriately tender.  Pelvis:Bleeding: minimal  Incision: healing well, no significant drainage, no dehiscence, no significant erythema Extremities: DVT Evaluation: No evidence of DVT seen on physical exam. Negative Homan's sign. No cords or calf tenderness. No significant calf/ankle edema.  WBC 4.0 - 10.5 K/uL 11.4(H) 6.6 6.1  Hemoglobin 12.0 - 15.0 g/dL 10.3(L) 11.8(L) 11.7(L)  Hematocrit 36.0 - 46.0 % 33.3(L) 39.4 39.4  Platelets 150 - 400 K/uL 299 415(H) 317     Lab Results  Component Value Date   CREATININE 0.72 10/28/2018   CREATININE 0.76 10/20/2018     Assessment/Plan: 1.Regular diet 2. Continue to encourage ambulation.  3. Resumed Xarelto overnight.  4. Can d/c home today.      Rubie Maid, MD [facility]

## 2018-10-30 NOTE — Progress Notes (Signed)
DC to home.  To car via WC 

## 2018-10-30 NOTE — Progress Notes (Signed)
RN entered room to reassess patient. RN smelled cigarette smoke at this time and the patient was coming from bathroom. RN asked patient if she was smoking in the bathroom and patient admitted that she was. RN explained that this was a huge safety issue and this was a tobacco free campus. Patient apologized and stated that she would not do it again.

## 2018-10-30 NOTE — Discharge Summary (Signed)
Gynecology Physician Postoperative Discharge Summary  Patient ID: Amy Wolfe MRN: [id number] DOB/AGE: January 06, 1975 43 y.o.  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 [id number]  admitted for scheduled surgery.  She underwent the procedures as mentioned above, her operation was uncomplicated. For further details about surgery, please refer to the operative report. Patient'S postoperative course was complicated by severe post-anesthetic nausea/vomiting. By the middle of Day#1, her symptoms were resolving and she had an otherwise uncomplicated postoperative course. By time of discharge on POD#2, her pain was controlled on oral pain medications; she was ambulating, voiding without difficulty, tolerating regular diet and passing flatus. She was deemed stable for discharge to home.   Significant Labs: CBC Latest Ref Rng & Units 10/29/2018 10/20/2018 09/26/2018  WBC 4.0 - 10.5 K/uL 11.4(H) 6.6 6.1  Hemoglobin 12.0 - 15.0 g/dL 10.3(L) 11.8(L) 11.7(L)  Hematocrit 36.0 - 46.0 % 33.3(L) 39.4 39.4  Platelets 150 - 400 K/uL 299 415(H) 317    Discharge Exam: Blood pressure 119/84, pulse 65, temperature 98.4 F (36.9 C), temperature source Oral, resp. rate 20, last menstrual period 09/30/2018, SpO2 99 %. General appearance: alert and no distress  Resp: clear to auscultation bilaterally  Cardio: regular rate and rhythm  GI: soft, non-tender; bowel sounds normal; no masses, no organomegaly.  Incision:  C/D/I, no erythema, no drainage noted Pelvic: scant blood on pad  Extremities: extremities normal, atraumatic, no cyanosis or edema and Homans sign is negative, no sign of DVT  Discharged Condition: Stable  Disposition: Discharge disposition: 01-Home or Self Care       Discharge Instructions    Discharge patient   Complete by: As directed    Discharge disposition: 01-Home or Self Care   Discharge patient date: 10/30/2018     Allergies as of 10/30/2018   No Known Allergies     Medication List    STOP taking these medications   ferrous sulfate 325 (65 FE) MG tablet Commonly known as: FerrouSul     TAKE these medications   acetaminophen 650 MG CR tablet Commonly known as: Tylenol 8 Hour Take 1 tablet (650 mg total) by mouth every 8 (eight) hours as needed for pain.   amLODipine 10 MG tablet Commonly known as: NORVASC Take 1 tablet (10 mg total) by mouth daily. What changed: when to take this   docusate sodium 100 MG capsule Commonly known as: COLACE Take 1 capsule (100 mg total) by mouth 2 (two) times daily as needed. What changed:   when to take this  additional instructions   HYDROcodone-acetaminophen 5-325 MG tablet Commonly known as: Norco Take 1-2 tablets by mouth every 6 (six) hours as needed for moderate pain.   lipase/protease/amylase 36000 UNITS Cpep capsule Commonly known as: CREON Take 2 capsules with each meal and 1 capsule with a snack What changed:   how much to take  how to take this  when to take this  additional instructions   pantoprazole 40 MG tablet Commonly known as: PROTONIX Take 1 tablet (40 mg total) by mouth daily. What changed: when to take this   rivaroxaban 20 MG Tabs tablet Commonly known as:  XARELTO Take 1 tablet (20 mg total) by mouth daily with supper.      Future Appointments  Date Time Provider [place]  11/18/2018  9:00 AM [facility]  11/22/2018  3:30 PM Kris Hartmann, NP  [facility] None   Follow-up Information    Rubie Maid, MD Follow up in 1 week(s).   Specialties: Obstetrics and Gynecology, Radiology Why: Post-operative visit Contact information: [place] [address] [phone]           Signed:  Rubie Maid, MD [facility]

## 2018-11-04 ENCOUNTER — Encounter: Payer: Self-pay | Admitting: Emergency Medicine

## 2018-11-04 ENCOUNTER — Inpatient Hospital Stay
Admission: EM | Admit: 2018-11-04 | Discharge: 2018-11-11 | DRG: 863 | Disposition: A | Discharge status: Home / Self Care | Payer: Medicaid Other | Attending: Obstetrics and Gynecology | Admitting: Obstetrics and Gynecology

## 2018-11-04 ENCOUNTER — Other Ambulatory Visit: Payer: Self-pay

## 2018-11-04 ENCOUNTER — Emergency Department: Payer: Medicaid Other

## 2018-11-04 DIAGNOSIS — R112 Nausea with vomiting, unspecified: Secondary | ICD-10-CM

## 2018-11-04 DIAGNOSIS — I81 Portal vein thrombosis: Secondary | ICD-10-CM

## 2018-11-04 DIAGNOSIS — Z9889 Other specified postprocedural states: Secondary | ICD-10-CM

## 2018-11-04 DIAGNOSIS — G8918 Other acute postprocedural pain: Secondary | ICD-10-CM

## 2018-11-04 DIAGNOSIS — K861 Other chronic pancreatitis: Secondary | ICD-10-CM | POA: Diagnosis present

## 2018-11-04 DIAGNOSIS — F17210 Nicotine dependence, cigarettes, uncomplicated: Secondary | ICD-10-CM | POA: Diagnosis present

## 2018-11-04 DIAGNOSIS — M5136 Other intervertebral disc degeneration, lumbar region: Secondary | ICD-10-CM | POA: Diagnosis present

## 2018-11-04 DIAGNOSIS — Z86718 Personal history of other venous thrombosis and embolism: Secondary | ICD-10-CM

## 2018-11-04 DIAGNOSIS — D649 Anemia, unspecified: Secondary | ICD-10-CM

## 2018-11-04 DIAGNOSIS — T8143XA Infection following a procedure, organ and space surgical site, initial encounter: Principal | ICD-10-CM | POA: Diagnosis present

## 2018-11-04 DIAGNOSIS — I10 Essential (primary) hypertension: Secondary | ICD-10-CM

## 2018-11-04 DIAGNOSIS — Z7901 Long term (current) use of anticoagulants: Secondary | ICD-10-CM

## 2018-11-04 DIAGNOSIS — N760 Acute vaginitis: Secondary | ICD-10-CM | POA: Diagnosis present

## 2018-11-04 DIAGNOSIS — Z20828 Contact with and (suspected) exposure to other viral communicable diseases: Secondary | ICD-10-CM | POA: Diagnosis present

## 2018-11-04 DIAGNOSIS — N739 Female pelvic inflammatory disease, unspecified: Secondary | ICD-10-CM

## 2018-11-04 LAB — COMPREHENSIVE METABOLIC PANEL
ALT: 14 U/L (ref 0–44)
AST: 27 U/L (ref 15–41)
Albumin: 3.2 g/dL — ABNORMAL LOW (ref 3.5–5.0)
Alkaline Phosphatase: 142 U/L — ABNORMAL HIGH (ref 38–126)
Anion gap: 12 (ref 5–15)
BUN: 12 mg/dL (ref 6–20)
CO2: 28 mmol/L (ref 22–32)
Calcium: 8.6 mg/dL — ABNORMAL LOW (ref 8.9–10.3)
Chloride: 96 mmol/L — ABNORMAL LOW (ref 98–111)
Creatinine, Ser: 0.71 mg/dL (ref 0.44–1.00)
GFR calc Af Amer: >60 mL/min (ref >60)
GFR calc non Af Amer: >60 mL/min (ref >60)
Glucose, Bld: 131 mg/dL — ABNORMAL HIGH (ref 70–99)
Potassium: 3.1 mmol/L — ABNORMAL LOW (ref 3.5–5.1)
Sodium: 136 mmol/L (ref 135–145)
Total Bilirubin: 0.7 mg/dL (ref 0.3–1.2)
Total Protein: 8.4 g/dL — ABNORMAL HIGH (ref 6.5–8.1)

## 2018-11-04 LAB — CBC
HCT: 36.7 % (ref 36.0–46.0)
Hemoglobin: 11.4 g/dL — ABNORMAL LOW (ref 12.0–15.0)
MCH: 21.6 pg — ABNORMAL LOW (ref 26.0–34.0)
MCHC: 31.1 g/dL (ref 30.0–36.0)
MCV: 69.6 fL — ABNORMAL LOW (ref 80.0–100.0)
Platelets: 516 10*3/uL — ABNORMAL HIGH (ref 150–400)
RBC: 5.27 MIL/uL — ABNORMAL HIGH (ref 3.87–5.11)
RDW: 18.4 % — ABNORMAL HIGH (ref 11.5–15.5)
WBC: 15.3 10*3/uL — ABNORMAL HIGH (ref 4.0–10.5)
nRBC: 0 % (ref 0.0–0.2)

## 2018-11-04 LAB — LIPASE, BLOOD: Lipase: 13 U/L (ref 11–51)

## 2018-11-04 MED ORDER — SCOPOLAMINE 1 MG/3DAYS TD PT72
1.0000 | MEDICATED_PATCH | TRANSDERMAL | Status: DC
  Administered 2018-11-04 – 2018-11-07 (×2): 1.5 mg via TRANSDERMAL
  Filled 2018-11-04 (×3): qty 1

## 2018-11-04 MED ORDER — SODIUM CHLORIDE 0.9 % IV SOLN
1000.0000 mL | Freq: Once | INTRAVENOUS | Status: AC
  Administered 2018-11-04: 1000 mL via INTRAVENOUS

## 2018-11-04 MED ORDER — TRAMADOL HCL 50 MG PO TABS
100.0000 mg | ORAL_TABLET | Freq: Four times a day (QID) | ORAL | Status: DC | PRN
  Administered 2018-11-05 – 2018-11-11 (×18): 100 mg via ORAL
  Filled 2018-11-04 (×18): qty 2

## 2018-11-04 MED ORDER — SODIUM CHLORIDE 0.9 % IV SOLN
1.0000 g | Freq: Four times a day (QID) | INTRAVENOUS | Status: DC
  Administered 2018-11-04 – 2018-11-05 (×4): 1 g via INTRAVENOUS
  Filled 2018-11-04 (×7): qty 1

## 2018-11-04 MED ORDER — PROMETHAZINE HCL 25 MG/ML IJ SOLN
25.0000 mg | Freq: Four times a day (QID) | INTRAMUSCULAR | Status: DC | PRN
  Administered 2018-11-05 (×2): 25 mg via INTRAVENOUS
  Filled 2018-11-04 (×2): qty 1

## 2018-11-04 MED ORDER — DOCUSATE SODIUM 100 MG PO CAPS: 100.0000 mg | ORAL_CAPSULE | Freq: Two times a day (BID) | ORAL | Status: DC

## 2018-11-04 MED ORDER — ACETAMINOPHEN 500 MG PO TABS
1000.0000 mg | ORAL_TABLET | Freq: Four times a day (QID) | ORAL | Status: DC | PRN
  Administered 2018-11-05 – 2018-11-09 (×3): 1000 mg via ORAL
  Filled 2018-11-04 (×5): qty 2

## 2018-11-04 MED ORDER — ZOLPIDEM TARTRATE 5 MG PO TABS
5.0000 mg | ORAL_TABLET | Freq: Every evening | ORAL | Status: DC | PRN
  Filled 2018-11-04: qty 1

## 2018-11-04 MED ORDER — MAGNESIUM CITRATE PO SOLN
1.0000 | Freq: Once | ORAL | Status: DC | PRN
  Filled 2018-11-04: qty 296

## 2018-11-04 MED ORDER — ALUM & MAG HYDROXIDE-SIMETH 200-200-20 MG/5ML PO SUSP: 30.0000 mL | ORAL | Status: DC | PRN

## 2018-11-04 MED ORDER — HYDROMORPHONE HCL 1 MG/ML IJ SOLN
0.5000 mg | Freq: Once | INTRAMUSCULAR | Status: AC
  Administered 2018-11-04: 0.5 mg via INTRAVENOUS
  Filled 2018-11-04: qty 1

## 2018-11-04 MED ORDER — ONDANSETRON HCL 4 MG/2ML IJ SOLN
4.0000 mg | Freq: Once | INTRAMUSCULAR | Status: AC
  Administered 2018-11-04: 4 mg via INTRAVENOUS
  Filled 2018-11-04: qty 2

## 2018-11-04 MED ORDER — ONDANSETRON HCL 4 MG PO TABS: 4.0000 mg | ORAL_TABLET | Freq: Four times a day (QID) | ORAL | Status: DC | PRN

## 2018-11-04 MED ORDER — AMLODIPINE BESYLATE 10 MG PO TABS
10.0000 mg | ORAL_TABLET | Freq: Every day | ORAL | Status: DC
  Administered 2018-11-05 (×2): 10 mg via ORAL
  Filled 2018-11-04 (×8): qty 1

## 2018-11-04 MED ORDER — PANTOPRAZOLE SODIUM 40 MG PO TBEC
40.0000 mg | DELAYED_RELEASE_TABLET | Freq: Every day | ORAL | Status: DC
  Administered 2018-11-05 – 2018-11-06 (×2): 40 mg via ORAL
  Filled 2018-11-04 (×2): qty 1

## 2018-11-04 MED ORDER — BISACODYL 5 MG PO TBEC
5.0000 mg | DELAYED_RELEASE_TABLET | Freq: Every day | ORAL | Status: DC | PRN
  Filled 2018-11-04: qty 1

## 2018-11-04 MED ORDER — HYDROMORPHONE HCL 1 MG/ML IJ SOLN
0.2000 mg | INTRAMUSCULAR | Status: DC | PRN
  Administered 2018-11-04 – 2018-11-05 (×11): 0.6 mg via INTRAVENOUS
  Filled 2018-11-04 (×11): qty 1

## 2018-11-04 MED ORDER — MAGNESIUM HYDROXIDE 400 MG/5ML PO SUSP: 30.0000 mL | Freq: Every day | ORAL | Status: DC | PRN

## 2018-11-04 MED ORDER — MORPHINE SULFATE (PF) 4 MG/ML IV SOLN
4.0000 mg | Freq: Once | INTRAVENOUS | Status: AC
  Administered 2018-11-04: 4 mg via INTRAVENOUS
  Filled 2018-11-04: qty 1

## 2018-11-04 MED ORDER — HYDROCODONE-ACETAMINOPHEN 5-325 MG PO TABS
1.0000 | ORAL_TABLET | Freq: Four times a day (QID) | ORAL | Status: DC | PRN
  Administered 2018-11-06 – 2018-11-09 (×11): 2 via ORAL
  Filled 2018-11-04 (×11): qty 2

## 2018-11-04 MED ORDER — ONDANSETRON HCL 4 MG/2ML IJ SOLN
INTRAMUSCULAR | Status: AC
  Filled 2018-11-04: qty 2

## 2018-11-04 MED ORDER — LACTATED RINGERS IV SOLN
INTRAVENOUS | Status: DC
  Administered 2018-11-04 – 2018-11-06 (×5): via INTRAVENOUS

## 2018-11-04 MED ORDER — RIVAROXABAN 20 MG PO TABS
20.0000 mg | ORAL_TABLET | Freq: Every day | ORAL | Status: DC
  Administered 2018-11-05 – 2018-11-10 (×4): 20 mg via ORAL
  Filled 2018-11-04 (×7): qty 1

## 2018-11-04 MED ORDER — ONDANSETRON HCL 4 MG/2ML IJ SOLN
4.0000 mg | Freq: Four times a day (QID) | INTRAMUSCULAR | Status: DC | PRN
  Administered 2018-11-04 – 2018-11-05 (×2): 4 mg via INTRAVENOUS
  Filled 2018-11-04: qty 2

## 2018-11-04 MED ORDER — IOHEXOL 300 MG/ML  SOLN
100.0000 mL | Freq: Once | INTRAMUSCULAR | Status: AC | PRN
  Administered 2018-11-04: 100 mL via INTRAVENOUS
  Filled 2018-11-04: qty 100

## 2018-11-04 MED ORDER — MORPHINE SULFATE (PF) 4 MG/ML IV SOLN
4.0000 mg | Freq: Once | INTRAVENOUS | Status: AC
  Administered 2018-11-04: 17:00:00 4 mg via INTRAVENOUS

## 2018-11-04 MED ORDER — SODIUM CHLORIDE 0.9% FLUSH
3.0000 mL | Freq: Once | INTRAVENOUS | Status: AC
  Administered 2018-11-04: 3 mL via INTRAVENOUS

## 2018-11-04 MED ORDER — MORPHINE SULFATE (PF) 4 MG/ML IV SOLN
INTRAVENOUS | Status: AC
  Administered 2018-11-04: 4 mg via INTRAVENOUS
  Filled 2018-11-04: qty 1

## 2018-11-04 MED ORDER — DOCUSATE SODIUM 100 MG PO CAPS
100.0000 mg | ORAL_CAPSULE | Freq: Two times a day (BID) | ORAL | Status: DC
  Administered 2018-11-05 – 2018-11-11 (×8): 100 mg via ORAL
  Filled 2018-11-04 (×9): qty 1

## 2018-11-04 MED ORDER — PANCRELIPASE (LIP-PROT-AMYL) 12000-38000 UNITS PO CPEP
72000.0000 [IU] | ORAL_CAPSULE | Freq: Three times a day (TID) | ORAL | Status: DC
  Filled 2018-11-04 (×21): qty 6

## 2018-11-04 NOTE — ED Notes (Signed)
Before giving pt prescribed morphine, pt states that she doesn't think it is going to work. While giving morphine pt states that she needs that "thing that starts with a D". As soon as this RN finished pushing morphine pt states that it is not working. Requested that give it a minute to kick in and if still not working will let MD know.

## 2018-11-04 NOTE — H&P (Signed)
Reason for Consult: Nausea and Vomiting, abdominal pain after hysterectomy Referring Physician: Lavonia Drafts, MD (ER Physician)  Amy Wolfe is an 43 y.o. [phone] female who presented to the Emergency Room with complaints of nausea and vomiting since Wednesday.  Of note, patient has a history of recent laparoscopic-assisted vaginal hysterectomy last Monday, complicated by post-anesthetic nausea and vomiting.  She also has a history of portal vein thrombosis on anticoagulation and chronic pancreatitis.  Also reporting worsening pelvic pain over the past few days. Denies fever, chills, foul-smelling vaginal discharge, SOB, chest pain.      Past Medical History:  Diagnosis Date  . Abdominal pain   . Anxiety   . Coronary artery disease   . Dyspnea   . GERD (gastroesophageal reflux disease)   . H/O blood clots   . Hypertension   . Liver abscess   . Pancreatitis   . Thyroid disease   . Uterine fibroid     Past Surgical History:  Procedure Laterality Date  . CESAREAN SECTION    . EMBOLIZATION N/A 06/14/2018   Procedure: Uterine Embolization;  Surgeon: Algernon Huxley, MD;  Location: [facility];  Service: Cardiovascular;  Laterality: N/A;  . laceration finger Right   . LAPAROSCOPIC VAGINAL HYSTERECTOMY WITH SALPINGECTOMY Bilateral 10/28/2018   Procedure: LAPAROSCOPIC ASSISTED VAGINAL HYSTERECTOMY BILATERAL WITH SALPINGECTOMY;  Surgeon: Rubie Maid, MD;  Location: [facility];  Service: Gynecology;  Laterality: Bilateral;  . THYROIDECTOMY, PARTIAL    . VAGINAL HYSTERECTOMY Bilateral 10/28/2018   Procedure: HYSTERECTOMY VAGINAL WITH BILATERAL SALPINGECTOMY;  Surgeon: Rubie Maid, MD;  Location: [facility];  Service: Gynecology;  Laterality: Bilateral;    Family History  Problem Relation Age of Onset  . Hypertension Mother   . Diabetes Mother   . Cancer Father   . Cancer Paternal Grandmother   . Cancer Paternal Grandfather   . Aneurysm Brother     Social History:  reports  that she has been smoking cigarettes. She has a 17.50 pack-year smoking history. She has never used smokeless tobacco. She reports previous alcohol use. She reports current drug use. Drug: Marijuana.  Allergies: No Known Allergies  Medications:  No current facility-administered medications on file prior to encounter.    Current Outpatient Medications on File Prior to Encounter  Medication Sig Dispense Refill  . acetaminophen (TYLENOL 8 HOUR) 650 MG CR tablet Take 1 tablet (650 mg total) by mouth every 8 (eight) hours as needed for pain. 30 tablet 0  . amLODipine (NORVASC) 10 MG tablet Take 1 tablet (10 mg total) by mouth daily. (Patient taking differently: Take 10 mg by mouth at bedtime. ) 90 tablet 0  . docusate sodium (COLACE) 100 MG capsule Take 1 capsule (100 mg total) by mouth 2 (two) times daily as needed. (Patient taking differently: Take 100 mg by mouth daily. ) 30 capsule 2  . HYDROcodone-acetaminophen (NORCO) 5-325 MG tablet Take 1-2 tablets by mouth every 6 (six) hours as needed for moderate pain. 20 tablet 0  . lipase/protease/amylase (CREON) 36000 UNITS CPEP capsule Take 2 capsules with each meal and 1 capsule with a snack (Patient taking differently: Take 36,000-72,000 Units by mouth See admin instructions. Take 2 capsules (72000 units) with each meal & 1 capsule (36000 units) with each snack) 210 capsule 3  . pantoprazole (PROTONIX) 40 MG tablet Take 1 tablet (40 mg total) by mouth daily. (Patient taking differently: Take 40 mg by mouth at bedtime. ) 30 tablet 6  . rivaroxaban (XARELTO) 20 MG TABS  tablet Take 1 tablet (20 mg total) by mouth daily with supper. 30 tablet 0     Review of Systems  Constitutional: Negative for chills and fever.  HENT: Negative.   Eyes: Negative.   Respiratory: Negative for cough, shortness of breath and wheezing.   Cardiovascular: Negative for chest pain, palpitations and leg swelling.  Gastrointestinal: Positive for abdominal pain, heartburn,  nausea and vomiting. Negative for constipation.  Genitourinary: Negative for dysuria, frequency and urgency.  Musculoskeletal: Negative.   Skin: Negative for itching and rash.  Neurological: Negative for dizziness and weakness.  Endo/Heme/Allergies: Negative.   Psychiatric/Behavioral: Negative.     Blood pressure 114/70, pulse 79, temperature 98.7 F (37.1 C), temperature source Oral, resp. rate 18, height 5\' 7"  (1.702 m), weight 75.3 kg, last menstrual period 09/30/2018, SpO2 100 %. Physical Exam  Constitutional: She is oriented to person, place, and time. She appears well-developed and well-nourished. No distress.  HENT:  Head: Normocephalic and atraumatic.  Mouth/Throat: No oropharyngeal exudate.  Eyes: Pupils are equal, round, and reactive to light. Conjunctivae are normal. No scleral icterus.  Neck: Normal range of motion. Neck supple. No thyromegaly present.  Cardiovascular: Normal rate, regular rhythm and normal heart sounds. Exam reveals no gallop and no friction rub.  No murmur heard. Respiratory: Effort normal and breath sounds normal. No respiratory distress.  GI: Soft. Bowel sounds are normal. She exhibits no mass. There is abdominal tenderness. There is no guarding.  Genitourinary:    No vaginal discharge.     Genitourinary Comments: Deferred   Musculoskeletal: Normal range of motion.        General: No tenderness, deformity or edema.  Neurological: She is alert and oriented to person, place, and time.  Skin: Skin is warm and dry. No rash noted. No erythema.  Psychiatric: She has a normal mood and affect. Thought content normal.    Results for orders placed or performed during the hospital encounter of 11/04/18 (from the past 48 hour(s))  Lipase, blood     Status: None   Collection Time: 11/04/18  4:27 PM  Result Value Ref Range   Lipase 13 11 - 51 U/L    Comment: Performed at [facility], [address] [place], [place] [address]  Comprehensive  metabolic panel     Status: Abnormal   Collection Time: 11/04/18  4:27 PM  Result Value Ref Range   Sodium 136 135 - 145 mmol/L   Potassium 3.1 (L) 3.5 - 5.1 mmol/L   Chloride 96 (L) 98 - 111 mmol/L   CO2 28 22 - 32 mmol/L   Glucose, Bld 131 (H) 70 - 99 mg/dL   BUN 12 6 - 20 mg/dL   Creatinine, Ser 0.71 0.44 - 1.00 mg/dL   Calcium 8.6 (L) 8.9 - 10.3 mg/dL   Total Protein 8.4 (H) 6.5 - 8.1 g/dL   Albumin 3.2 (L) 3.5 - 5.0 g/dL   AST 27 15 - 41 U/L   ALT 14 0 - 44 U/L   Alkaline Phosphatase 142 (H) 38 - 126 U/L   Total Bilirubin 0.7 0.3 - 1.2 mg/dL   GFR calc non Af Amer >60 >60 mL/min   GFR calc Af Amer >60 >60 mL/min   Anion gap 12 5 - 15    Comment: Performed at [facility], [address], [place], [place] [address]  CBC     Status: Abnormal   Collection Time: 11/04/18  4:27 PM  Result Value Ref Range  WBC 15.3 (H) 4.0 - 10.5 K/uL   RBC 5.27 (H) 3.87 - 5.11 MIL/uL   Hemoglobin 11.4 (L) 12.0 - 15.0 g/dL   HCT 36.7 36.0 - 46.0 %   MCV 69.6 (L) 80.0 - 100.0 fL   MCH 21.6 (L) 26.0 - 34.0 pg   MCHC 31.1 30.0 - 36.0 g/dL   RDW 18.4 (H) 11.5 - 15.5 %   Platelets 516 (H) 150 - 400 K/uL   nRBC 0.0 0.0 - 0.2 %    Comment: Performed at [facility], [address] [place], [place] [address]  SARS CORONAVIRUS 2 (TAT 6-24 HRS) Nasopharyngeal Nasopharyngeal Swab     Status: None   Collection Time: 11/04/18  8:21 PM   Specimen: Nasopharyngeal Swab  Result Value Ref Range   SARS Coronavirus 2 NEGATIVE NEGATIVE    Comment: (NOTE) SARS-CoV-2 target nucleic acids are NOT DETECTED. The SARS-CoV-2 RNA is generally detectable in upper and lower respiratory specimens during the acute phase of infection. Negative results do not preclude SARS-CoV-2 infection, do not rule out co-infections with other pathogens, and should not be used as the sole basis for treatment or other patient management decisions. Negative results must be combined with clinical  observations, patient history, and epidemiological information. The expected result is Negative. Fact Sheet for Patients: SugarRoll.be Fact Sheet for Healthcare Providers: https://www.woods-mathews.com/ This test is not yet approved or cleared by the [place] FDA and  has been authorized for detection and/or diagnosis of SARS-CoV-2 by FDA under an Emergency Use Authorization (EUA). This EUA will remain  in effect (meaning this test can be used) for the duration of the COVID-19 declaration under Section 56 4(b)(1) of the Act, 21 U.S.C. section 360bbb-3(b)(1), unless the authorization is terminated or revoked sooner. Performed at [facility], [place] [address], [place], [place] [address]   CBC WITH DIFFERENTIAL     Status: Abnormal   Collection Time: 11/05/18  5:38 AM  Result Value Ref Range   WBC 13.0 (H) 4.0 - 10.5 K/uL   RBC 4.81 3.87 - 5.11 MIL/uL   Hemoglobin 10.3 (L) 12.0 - 15.0 g/dL   HCT 33.2 (L) 36.0 - 46.0 %   MCV 69.0 (L) 80.0 - 100.0 fL   MCH 21.4 (L) 26.0 - 34.0 pg   MCHC 31.0 30.0 - 36.0 g/dL   RDW 18.3 (H) 11.5 - 15.5 %   Platelets 434 (H) 150 - 400 K/uL   nRBC 0.0 0.0 - 0.2 %   Neutrophils Relative % 74 %   Neutro Abs 9.6 (H) 1.7 - 7.7 K/uL   Lymphocytes Relative 8 %   Lymphs Abs 1.0 0.7 - 4.0 K/uL   Monocytes Relative 17 %   Monocytes Absolute 2.2 (H) 0.1 - 1.0 K/uL   Eosinophils Relative 0 %   Eosinophils Absolute 0.1 0.0 - 0.5 K/uL   Basophils Relative 0 %   Basophils Absolute 0.0 0.0 - 0.1 K/uL   WBC Morphology MORPHOLOGY UNREMARKABLE    RBC Morphology MICROCYTES    Smear Review Normal platelet morphology    Immature Granulocytes 1 %   Abs Immature Granulocytes 0.09 (H) 0.00 - 0.07 K/uL    Comment: Performed at [facility], [address] [place], [place] [address]    Ct Abdomen Pelvis W Contrast  Result Date: 11/04/2018 CLINICAL DATA:  Vomiting and nausea. Laparoscopic vaginal  hysterectomy with bilateral salpingectomy 10/28/2018. EXAM: CT ABDOMEN AND PELVIS WITH CONTRAST TECHNIQUE: Multidetector CT imaging of the abdomen  and pelvis was performed using the standard protocol following bolus administration of intravenous contrast. CONTRAST:  137mL OMNIPAQUE IOHEXOL 300 MG/ML  SOLN COMPARISON:  Multiple exams, including 05/15/2018 and MRI from 05/31/2018 FINDINGS: Lower chest: Lymph node adjacent to the distal thoracic esophagus 0.8 cm in short axis on image 10/2, significance uncertain. 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 No significant focal hepatic lesion is identified. Pancreas: Mild indistinct prominence of the dorsal pancreatic duct. The previous cystic lesion in the pancreatic body is much less appreciable on today's exam and manifests onlay as vague hypodensity rather than the previous 2.8 cm lesion. Punctate calcification in the pancreatic head near the dorsal pancreatic duct on image 31/2. Spleen: Unremarkable Adrenals/Urinary Tract: Unremarkable Stomach/Bowel: Unremarkable.  Appendix normal. 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 Collateral vessels especially along the right and left gastric regions. Reproductive: Uterus absent. Clips along the adnexa with indistinct adnexal densities probably representing the ovaries, removal of the ovaries is somewhat ambiguous in the operative note but ovarian specimens are not listed in the pathology report. Fluid density lesion associated with the right ovary proximally 2.2 by 1.4 cm on image 33/5, surrounded by adjacent ascites. Other: Approximately 180 cubic cm collection of fluid in the pelvis above the urinary bladder, containing a small amount of extraluminal gas and with mildly enhancing margins, as shown on  image 38/5 and image 48/5. We were currently postoperative day 7. Small amount of perihepatic and perisplenic ascites also tracking in the paracolic gutters. There is fluid infiltration of the omentum and of the mesentery. Musculoskeletal: Lumbar degenerative disc disease at L4-5 causing right greater than left foraminal impingement. IMPRESSION: 1. Infiltrative edema in the omentum and mesentery with about 180 cc of pelvic ascites which has a thin enhancing margin and a tiny loculation of residual extraluminal gas. Postoperative gas can persist for up to 10 days after an open surgery, extraluminal gas usually clears within a few days after laparoscopy, and the enhancement along the margins of the pelvic collection of ascites raise the possibility of exudative fluid/inflammation rather typical postoperative fluid. Given the overall appearance, infection/peritonitis cannot be readily excluded, although overt abscess formation has not occurred. 2. Sequela of prior portal vein thrombosis, with cavernous transformation of the portal vein and chronic linear filling defect in the superior mesenteric vein. 3. Small fluid density lesion associated with the residual right ovary. 4. Previous cystic lesion of the pancreatic body is much less appreciable on today's exam, appearing is only a vague hypodensity rather than the previous 2.8 cm lesion shown on 05/31/2018. 5. Lumbar impingement at L4-5. Electronically Signed   By: Van Clines M.D.   On: 11/04/2018 18:06    Assessment/Plan: 1. Patient with nausea/vomiting, and abdominal/pelvic pain since several days after her surgery. CT scan with fluid collection (no abscess present), appears more like edematous fluid (blood count increased since admission last week).  WBC count is somewhat elevated since surgery, but patient remains afebrile, stable vitals.  Admitted for observation for possible vaginal cuff cellulitis vs peritonitis and treat with IV antibiotics  (Cefoxitin) and antiemetics, and make NPO. Will manage pain with IV pain meds until able to tolerate PO.  2. H/o portal vein thrombosis, on anticoagulation. Will continue while inpatient.    Rubie Maid, MD [facility] 11/04/2018

## 2018-11-04 NOTE — ED Triage Notes (Signed)
Arrives c/o vomiting and nausea since Wednesday.  States had hysterectomy on Monday.  States also has pancreatitis.

## 2018-11-04 NOTE — ED Triage Notes (Signed)
EMS administered 4mg  of zofran but continues to vomit. #20g to left hand.

## 2018-11-04 NOTE — ED Triage Notes (Signed)
Pt in via EMS from home with c/i hysterectomy a week ago and has not been able to eat or drinks since then. EMS reports pt is having extreme pain and vomiting.

## 2018-11-04 NOTE — ED Provider Notes (Signed)
[facility] Emergency Department Provider Note   ____________________________________________    I have reviewed the triage vital signs and the nursing notes.   HISTORY  Chief Complaint Emesis     HPI Amy Wolfe is a 43 y.o. female presents with complaints of abdominal pain, nausea and vomiting.  Patient had laparoscopic assisted vaginal hysterectomy on 1028.  Post procedure she had nausea and vomiting thought to be related to anesthesia.  She felt better and was discharged and did do well for 1 or 2 days but then her nausea vomiting has returned and she complains of worsening abdominal pain.  She denies fevers or chills.  No vaginal bleeding.  Past Medical History:  Diagnosis Date  . Abdominal pain   . Anxiety   . Coronary artery disease   . Dyspnea   . GERD (gastroesophageal reflux disease)   . H/O blood clots   . Hypertension   . Liver abscess   . Pancreatitis   . Thyroid disease   . Uterine fibroid     Patient Active Problem List   Diagnosis Date Noted  . Vaginal cuff cellulitis 11/04/2018  . Status post laparoscopic hysterectomy 10/28/2018  . Anticoagulated 10/04/2018  . Pelvic pain 10/04/2018  . 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 History of partial thyroidectomy 10/04/2018  . Symptomatic anemia 06/13/2018  . Swelling of limb 05/17/2018  . Essential hypertension 05/17/2018  . Pancreatitis, chronic ([place]) 05/17/2018  . Portal vein thrombosis 05/17/2018    Past Surgical History:  Procedure Laterality Date  . CESAREAN SECTION    . EMBOLIZATION N/A 06/14/2018   Procedure: Uterine Embolization;  Surgeon: Algernon Huxley, MD;  Location: [place] [facility];  Service: Cardiovascular;  Laterality: N/A;  . laceration finger Right   . LAPAROSCOPIC VAGINAL HYSTERECTOMY WITH SALPINGECTOMY Bilateral 10/28/2018   Procedure: LAPAROSCOPIC ASSISTED VAGINAL HYSTERECTOMY BILATERAL WITH SALPINGECTOMY;  Surgeon: Rubie Maid,  MD;  Location: [facility];  Service: Gynecology;  Laterality: Bilateral;  . THYROIDECTOMY, PARTIAL    . VAGINAL HYSTERECTOMY Bilateral 10/28/2018   Procedure: HYSTERECTOMY VAGINAL WITH BILATERAL SALPINGECTOMY;  Surgeon: Rubie Maid, MD;  Location: [facility];  Service: Gynecology;  Laterality: Bilateral;    Prior to Admission medications   Medication Sig Start Date End Date Taking? Authorizing Provider  acetaminophen (TYLENOL 8 HOUR) 650 MG CR tablet Take 1 tablet (650 mg total) by mouth every 8 (eight) hours as needed for pain. 10/30/18  Yes Rubie Maid, MD  amLODipine (NORVASC) 10 MG tablet Take 1 tablet (10 mg total) by mouth daily. Patient taking differently: Take 10 mg by mouth at bedtime.  08/15/18 11/13/18 Yes Rubie Maid, MD  docusate sodium (COLACE) 100 MG capsule Take 1 capsule (100 mg total) by mouth 2 (two) times daily as needed. Patient taking differently: Take 100 mg by mouth daily.  10/04/18  Yes Hubbard Hartshorn, FNP  HYDROcodone-acetaminophen (NORCO) 5-325 MG tablet Take 1-2 tablets by mouth every 6 (six) hours as needed for moderate pain. 10/30/18  Yes Rubie Maid, MD  lipase/protease/amylase (CREON) 36000 UNITS CPEP capsule Take 2 capsules with each meal and 1 capsule with a snack Patient taking differently: Take 36,000-72,000 Units by mouth See admin instructions. Take 2 capsules (72000 units) with each meal & 1 capsule (36000 units) with each snack 08/02/18  Yes Lucilla Lame, MD  pantoprazole (PROTONIX) 40 MG tablet Take 1 tablet (40 mg total) by mouth daily. Patient taking differently: Take 40 mg by mouth at bedtime.  06/26/18  Yes Rubie Maid, MD  rivaroxaban (XARELTO) 20 MG TABS tablet Take 1 tablet (20 mg total) by mouth daily with supper. 06/17/18  Yes Otila Back, MD     Allergies Patient has no known allergies.  Family History  Problem Relation Age of Onset  . Hypertension Mother   . Diabetes Mother   . Cancer Father   . Cancer Paternal Grandmother   . Cancer  Paternal Grandfather   . Aneurysm Brother     Social History Social History   Tobacco Use  . Smoking status: Current Every Day Smoker    Packs/day: 0.50    Years: 35.00    Pack years: 17.50    Types: Cigarettes  . Smokeless tobacco: Never Used  Substance Use Topics  . Alcohol use: Not Currently  . Drug use: Yes    Types: Marijuana    Comment: daily    Review of Systems  Constitutional: No fever/chills Eyes: No visual changes.  ENT: No sore throat. Cardiovascular: Denies chest pain. Respiratory: Denies shortness of breath. Gastrointestinal: As above Genitourinary: As above Musculoskeletal: Negative for back pain. Skin: Negative for rash. Neurological: Negative for headaches or weakness   ____________________________________________   PHYSICAL EXAM:  VITAL SIGNS: ED Triage Vitals  Enc Vitals Group     BP 11/04/18 1620 (!) 148/104     Pulse Rate 11/04/18 1620 (!) 110     Resp 11/04/18 1620 20     Temp 11/04/18 1620 99.1 F (37.3 C)     Temp Source 11/04/18 1620 Oral     SpO2 11/04/18 1620 97 %     Weight 11/04/18 1618 75.3 kg (166 lb 0.1 oz)     Height 11/04/18 1618 1.702 m (5\' 7" )     Head Circumference --      Peak Flow --      Pain Score 11/04/18 1617 10     Pain Loc --      Pain Edu? --      Excl. in [place]? --     Constitutional: Alert and oriented.   Nose: No congestion/rhinnorhea. Mouth/Throat: Mucous membranes are moist.    Cardiovascular: Normal rate, regular rhythm. Grossly normal heart sounds.  Good peripheral circulation. Respiratory: Normal respiratory effort.  No retractions.  Gastrointestinal: Mild tenderness in the right lower quadrant and left lower quadrant,. No distention.   Genitourinary: deferred per patient request Musculoskeletal: No lower extremity tenderness nor edema.  Warm and well perfused Neurologic:  Normal speech and language. No gross focal neurologic deficits are appreciated.  Skin:  Skin is warm, dry and intact. No rash  noted. Psychiatric: Mood and affect are normal. Speech and behavior are normal.  ____________________________________________   LABS (all labs ordered are listed, but only abnormal results are displayed)  Labs Reviewed  COMPREHENSIVE METABOLIC PANEL - Abnormal; Notable for the following components:      Result Value   Potassium 3.1 (*)    Chloride 96 (*)    Glucose, Bld 131 (*)    Calcium 8.6 (*)    Total Protein 8.4 (*)    Albumin 3.2 (*)    Alkaline Phosphatase 142 (*)    All other components within normal limits  CBC - Abnormal; Notable for the following components:   WBC 15.3 (*)    RBC 5.27 (*)    Hemoglobin 11.4 (*)    MCV 69.6 (*)    MCH 21.6 (*)    RDW 18.4 (*)    Platelets 516 (*)  All other components within normal limits  SARS CORONAVIRUS 2 (TAT 6-24 HRS)  LIPASE, BLOOD  CBC WITH DIFFERENTIAL/PLATELET   ____________________________________________  EKG  None ____________________________________________  RADIOLOGY  CT abdomen pelvis demonstrates thinly enhancing infiltrative edema question inflammation ____________________________________________   PROCEDURES  Procedure(s) performed: No  Procedures   Critical Care performed: No ____________________________________________   INITIAL IMPRESSION / ASSESSMENT AND PLAN / ED COURSE  Pertinent labs & imaging results that were available during my care of the patient were reviewed by me and considered in my medical decision making (see chart for details).  Patient presents with worsening abdominal pain, nausea and vomiting in the setting of recent hysterectomy.  Afebrile, however elevated white blood cell count.  Sent for CT scan after receiving IV morphine and IV Zofran.  CT demonstrates possible inflammation, discussed with Dr. Marcelline Mates of OB/GYN who will admit the patient overnight    ____________________________________________   FINAL CLINICAL IMPRESSION(S) / ED DIAGNOSES  Final diagnoses:   Post-operative pain        Note:  This document was prepared using Dragon voice recognition software and may include unintentional dictation errors.   Lavonia Drafts, MD 11/04/18 2027

## 2018-11-05 DIAGNOSIS — I10 Essential (primary) hypertension: Secondary | ICD-10-CM | POA: Diagnosis present

## 2018-11-05 DIAGNOSIS — N760 Acute vaginitis: Secondary | ICD-10-CM | POA: Diagnosis present

## 2018-11-05 DIAGNOSIS — M5136 Other intervertebral disc degeneration, lumbar region: Secondary | ICD-10-CM | POA: Diagnosis present

## 2018-11-05 DIAGNOSIS — Z7901 Long term (current) use of anticoagulants: Secondary | ICD-10-CM | POA: Diagnosis not present

## 2018-11-05 DIAGNOSIS — Z86718 Personal history of other venous thrombosis and embolism: Secondary | ICD-10-CM | POA: Diagnosis not present

## 2018-11-05 DIAGNOSIS — R102 Pelvic and perineal pain: Secondary | ICD-10-CM

## 2018-11-05 DIAGNOSIS — R112 Nausea with vomiting, unspecified: Secondary | ICD-10-CM | POA: Diagnosis present

## 2018-11-05 DIAGNOSIS — R1110 Vomiting, unspecified: Secondary | ICD-10-CM

## 2018-11-05 DIAGNOSIS — N739 Female pelvic inflammatory disease, unspecified: Secondary | ICD-10-CM | POA: Diagnosis not present

## 2018-11-05 DIAGNOSIS — Z20828 Contact with and (suspected) exposure to other viral communicable diseases: Secondary | ICD-10-CM | POA: Diagnosis present

## 2018-11-05 DIAGNOSIS — K861 Other chronic pancreatitis: Secondary | ICD-10-CM | POA: Diagnosis present

## 2018-11-05 DIAGNOSIS — F17210 Nicotine dependence, cigarettes, uncomplicated: Secondary | ICD-10-CM | POA: Diagnosis present

## 2018-11-05 DIAGNOSIS — T8143XA Infection following a procedure, organ and space surgical site, initial encounter: Secondary | ICD-10-CM | POA: Diagnosis present

## 2018-11-05 LAB — URINALYSIS, ROUTINE W REFLEX MICROSCOPIC
Bacteria, UA: NONE SEEN
Bilirubin Urine: NEGATIVE
Glucose, UA: NEGATIVE mg/dL
Hgb urine dipstick: NEGATIVE
Ketones, ur: 20 mg/dL — AB
Leukocytes,Ua: NEGATIVE
Nitrite: NEGATIVE
Protein, ur: 100 mg/dL — AB
Specific Gravity, Urine: 1.04 — ABNORMAL HIGH (ref 1.005–1.030)
pH: 5 (ref 5.0–8.0)

## 2018-11-05 LAB — CBC WITH DIFFERENTIAL/PLATELET
Abs Immature Granulocytes: 0.09 10*3/uL — ABNORMAL HIGH (ref 0.00–0.07)
Basophils Absolute: 0 10*3/uL (ref 0.0–0.1)
Basophils Relative: 0 %
Eosinophils Absolute: 0.1 10*3/uL (ref 0.0–0.5)
Eosinophils Relative: 0 %
HCT: 33.2 % — ABNORMAL LOW (ref 36.0–46.0)
Hemoglobin: 10.3 g/dL — ABNORMAL LOW (ref 12.0–15.0)
Immature Granulocytes: 1 %
Lymphocytes Relative: 8 %
Lymphs Abs: 1 10*3/uL (ref 0.7–4.0)
MCH: 21.4 pg — ABNORMAL LOW (ref 26.0–34.0)
MCHC: 31 g/dL (ref 30.0–36.0)
MCV: 69 fL — ABNORMAL LOW (ref 80.0–100.0)
Monocytes Absolute: 2.2 10*3/uL — ABNORMAL HIGH (ref 0.1–1.0)
Monocytes Relative: 17 %
Neutro Abs: 9.6 10*3/uL — ABNORMAL HIGH (ref 1.7–7.7)
Neutrophils Relative %: 74 %
Platelets: 434 10*3/uL — ABNORMAL HIGH (ref 150–400)
RBC: 4.81 MIL/uL (ref 3.87–5.11)
RDW: 18.3 % — ABNORMAL HIGH (ref 11.5–15.5)
Smear Review: NORMAL
WBC: 13 10*3/uL — ABNORMAL HIGH (ref 4.0–10.5)
nRBC: 0 % (ref 0.0–0.2)

## 2018-11-05 LAB — SARS CORONAVIRUS 2 (TAT 6-24 HRS): SARS Coronavirus 2: NEGATIVE

## 2018-11-05 MED ORDER — HYDROMORPHONE HCL 1 MG/ML IJ SOLN
1.0000 mg | INTRAMUSCULAR | Status: DC | PRN
  Administered 2018-11-05 – 2018-11-09 (×26): 1 mg via INTRAVENOUS
  Filled 2018-11-05 (×27): qty 1

## 2018-11-05 MED ORDER — PIPERACILLIN-TAZOBACTAM 3.375 G IVPB
3.3750 g | Freq: Three times a day (TID) | INTRAVENOUS | Status: DC
  Administered 2018-11-05 – 2018-11-08 (×8): 3.375 g via INTRAVENOUS
  Filled 2018-11-05 (×11): qty 50

## 2018-11-05 MED ORDER — FAMOTIDINE IN NACL 20-0.9 MG/50ML-% IV SOLN
20.0000 mg | Freq: Once | INTRAVENOUS | Status: DC
  Filled 2018-11-05: qty 50

## 2018-11-05 MED ORDER — FAMOTIDINE IN NACL 20-0.9 MG/50ML-% IV SOLN
20.0000 mg | INTRAVENOUS | Status: DC
  Administered 2018-11-05 – 2018-11-08 (×4): 20 mg via INTRAVENOUS
  Filled 2018-11-05 (×7): qty 50

## 2018-11-05 MED ORDER — METOCLOPRAMIDE HCL 5 MG/ML IJ SOLN
10.0000 mg | Freq: Four times a day (QID) | INTRAMUSCULAR | Status: DC
  Administered 2018-11-05 – 2018-11-09 (×13): 10 mg via INTRAVENOUS
  Filled 2018-11-05 (×13): qty 2

## 2018-11-05 MED ORDER — METOCLOPRAMIDE HCL 5 MG/ML IJ SOLN
10.0000 mg | Freq: Four times a day (QID) | INTRAMUSCULAR | Status: DC
  Administered 2018-11-05 (×2): 10 mg via INTRAVENOUS
  Filled 2018-11-05 (×2): qty 2

## 2018-11-05 MED ORDER — BISACODYL 10 MG RE SUPP: 10.0000 mg | Freq: Every day | RECTAL | Status: DC | PRN

## 2018-11-05 MED ORDER — PROMETHAZINE HCL 25 MG/ML IJ SOLN
25.0000 mg | Freq: Four times a day (QID) | INTRAMUSCULAR | Status: DC
  Administered 2018-11-05 – 2018-11-10 (×12): 25 mg via INTRAVENOUS
  Filled 2018-11-05 (×13): qty 1

## 2018-11-05 MED ORDER — PIPERACILLIN-TAZOBACTAM 3.375 G IVPB: 3.3750 g | Freq: Four times a day (QID) | INTRAVENOUS | Status: DC

## 2018-11-05 NOTE — Progress Notes (Signed)
Hospital Day # 1, admitted for suspected vaginal cuff cellulitis, pelvic pain, nausea/vomiting   Subjective: Patient complains that pain is minimally improved wit Dilaudid.  Uses showers to help.  Patient notes fever that occurred ~ 15 minutes ago.   Objective: Vitals:   11/05/18 0719 11/05/18 1134 11/05/18 1700 11/05/18 1810  BP: 114/70 114/74 131/80   Pulse: 79 75 89   Resp: 18 20 18    Temp: 98.7 F (37.1 C) 100.1 F (37.8 C) (!) 100.8 F (38.2 C) (!) 101.2 F (38.4 C)  TempSrc: Oral Oral Oral Oral  SpO2: 100%     Weight:      Height:        Physical Exam:  General: alert and no distress  Lungs: clear to auscultation bilaterally Heart: regular rate and rhythm, S1, S2 normal, no murmur, click, rub or gallop Abdomen: soft, non-distended. Tenderness generalized in entire abdomen.  Pelvis:Bleeding: None  Incision: healing well Extremities: DVT Evaluation: No evidence of DVT seen on physical exam. Negative Homan's sign. No cords or calf tenderness. No significant calf/ankle edema.   CBC Latest Ref Rng & Units 11/05/2018 11/04/2018 10/29/2018  WBC 4.0 - 10.5 K/uL 13.0(H) 15.3(H) 11.4(H)  Hemoglobin 12.0 - 15.0 g/dL 10.3(L) 11.4(L) 10.3(L)  Hematocrit 36.0 - 46.0 % 33.2(L) 36.7 33.3(L)  Platelets 150 - 400 K/uL 434(H) 516(H) 299     Lab Results  Component Value Date   CREATININE 0.71 11/04/2018     Assessment/Plan: 1. Patient currently on Cefoxitin for elevated WBC. Will change to Zosyn for more broad spectrum coverage due to new onset febrile morbidity. WBC count decreasing. Blood cultures ordered. 2. Continue NPO status for now.  3. Continue IV pain meds until better tolerating PO meds.  4. Continue antiemetics (Reglan/Phenergan/Scopolamine patch).     Rubie Maid, MD [facility]

## 2018-11-05 NOTE — Plan of Care (Signed)
Vs stable but watch pt's BP; pt does take norvasc; pt has pancreatitis and has some home meds ordered for here; pt c/o of pain in her lower abdomen "10 out of 10 and only gets to a 7 after the pain medicine"; pt able to get up to void on her own this shift; pt nauseated and did vomit once this shift

## 2018-11-05 NOTE — Progress Notes (Signed)
Pt states she is feeling better already with the Dilaudid 1mg  every 3 hours combined with the Tramadol PO 100mg  every 6 hours.   At 2010 her temperature had dropped to 97.7 and she was not feeling nauseous at the moment per patient.   She was on the phone with her friend and was laughing.   Stated she felt like she could get some rest tonight.   Pt peed and urine sample was collected and sent.   Will continue to monitor.

## 2018-11-05 NOTE — Progress Notes (Signed)
Pt remains NPO- continued c/o nausea despite change in medications this morning, no emesis today. Required IV Dilaudid every 2 hours with "some relief but it still feels the same" per pt. IV abx given as ordered- WBC trending down, tMax 100.1 around lunch today. Dr. Marcelline Mates updated and will round this evening after clinic. Pt updated.

## 2018-11-06 DIAGNOSIS — F17210 Nicotine dependence, cigarettes, uncomplicated: Secondary | ICD-10-CM

## 2018-11-06 LAB — CBC WITH DIFFERENTIAL/PLATELET
Abs Immature Granulocytes: 0.12 10*3/uL — ABNORMAL HIGH (ref 0.00–0.07)
Basophils Absolute: 0.1 10*3/uL (ref 0.0–0.1)
Basophils Relative: 0 %
Eosinophils Absolute: 0.1 10*3/uL (ref 0.0–0.5)
Eosinophils Relative: 1 %
HCT: 33.7 % — ABNORMAL LOW (ref 36.0–46.0)
Hemoglobin: 10.6 g/dL — ABNORMAL LOW (ref 12.0–15.0)
Immature Granulocytes: 1 %
Lymphocytes Relative: 9 %
Lymphs Abs: 1.3 10*3/uL (ref 0.7–4.0)
MCH: 21.6 pg — ABNORMAL LOW (ref 26.0–34.0)
MCHC: 31.5 g/dL (ref 30.0–36.0)
MCV: 68.6 fL — ABNORMAL LOW (ref 80.0–100.0)
Monocytes Absolute: 1.9 10*3/uL — ABNORMAL HIGH (ref 0.1–1.0)
Monocytes Relative: 13 %
Neutro Abs: 11.3 10*3/uL — ABNORMAL HIGH (ref 1.7–7.7)
Neutrophils Relative %: 76 %
Platelets: 458 10*3/uL — ABNORMAL HIGH (ref 150–400)
RBC: 4.91 MIL/uL (ref 3.87–5.11)
RDW: 18.6 % — ABNORMAL HIGH (ref 11.5–15.5)
WBC: 14.7 10*3/uL — ABNORMAL HIGH (ref 4.0–10.5)
nRBC: 0 % (ref 0.0–0.2)

## 2018-11-06 LAB — COMPREHENSIVE METABOLIC PANEL
ALT: 12 U/L (ref 0–44)
AST: 16 U/L (ref 15–41)
Albumin: 2.5 g/dL — ABNORMAL LOW (ref 3.5–5.0)
Alkaline Phosphatase: 131 U/L — ABNORMAL HIGH (ref 38–126)
Anion gap: 11 (ref 5–15)
BUN: 9 mg/dL (ref 6–20)
CO2: 27 mmol/L (ref 22–32)
Calcium: 8.2 mg/dL — ABNORMAL LOW (ref 8.9–10.3)
Chloride: 99 mmol/L (ref 98–111)
Creatinine, Ser: 0.69 mg/dL (ref 0.44–1.00)
GFR calc Af Amer: >60 mL/min (ref >60)
GFR calc non Af Amer: >60 mL/min (ref >60)
Glucose, Bld: 92 mg/dL (ref 70–99)
Potassium: 3.3 mmol/L — ABNORMAL LOW (ref 3.5–5.1)
Sodium: 137 mmol/L (ref 135–145)
Total Bilirubin: 0.5 mg/dL (ref 0.3–1.2)
Total Protein: 7 g/dL (ref 6.5–8.1)

## 2018-11-06 LAB — URINE CULTURE: Culture: NO GROWTH

## 2018-11-06 MED ORDER — SODIUM CHLORIDE (PF) 0.9 % IJ SOLN
INTRAMUSCULAR | Status: AC
  Administered 2018-11-06: 14:00:00
  Filled 2018-11-06: qty 10

## 2018-11-06 MED ORDER — PHENAZOPYRIDINE HCL 100 MG PO TABS
100.0000 mg | ORAL_TABLET | Freq: Three times a day (TID) | ORAL | Status: DC
  Administered 2018-11-06 – 2018-11-07 (×5): 100 mg via ORAL
  Filled 2018-11-06 (×6): qty 1

## 2018-11-06 NOTE — Progress Notes (Signed)
Hospital Day # 2, admitted for suspected vaginal cuff cellulitis, pelvic pain, nausea/vomiting   Subjective: Patient reports pain is unchanged.  No further fevers overnight. Patient reports last BM was yesterday. Has not had any further nausea and vomiting. Reports pain with urination.   Objective: Temp:  [97.4 F (36.3 C)-101.2 F (38.4 C)] 98 F (36.7 C) (11/04 0740) Pulse Rate:  [74-89] 77 (11/04 0740) Resp:  [16-20] 18 (11/04 0740) BP: (106-132)/(65-89) 122/77 (11/04 0740) SpO2:  [96 %-100 %] 97 % (11/04 0740)  Physical Exam:  General: alert and no distress  Lungs: clear to auscultation bilaterally Heart: regular rate and rhythm, S1, S2 normal, no murmur, click, rub or gallop Abdomen: soft, non-distended. Tenderness generalized in entire abdomen.  Pelvis:Bleeding: None  Incision: healing well Extremities: DVT Evaluation: No evidence of DVT seen on physical exam. Negative Homan's sign. No cords or calf tenderness. No significant calf/ankle edema.   CBC Latest Ref Rng & Units 11/06/2018 11/05/2018 11/04/2018  WBC 4.0 - 10.5 K/uL 14.7(H) 13.0(H) 15.3(H)  Hemoglobin 12.0 - 15.0 g/dL 10.6(L) 10.3(L) 11.4(L)  Hematocrit 36.0 - 46.0 % 33.7(L) 33.2(L) 36.7  Platelets 150 - 400 K/uL 458(H) 434(H) 516(H)     CMP Latest Ref Rng & Units 11/06/2018 11/04/2018 10/28/2018  Glucose 70 - 99 mg/dL 92 131(H) 185(H)  BUN 6 - 20 mg/dL 9 12 12   Creatinine 0.44 - 1.00 mg/dL 0.69 0.71 0.72  Sodium 135 - 145 mmol/L 137 136 136  Potassium 3.5 - 5.1 mmol/L 3.3(L) 3.1(L) 3.7  Chloride 98 - 111 mmol/L 99 96(L) 102  CO2 22 - 32 mmol/L 27 28 25   Calcium 8.9 - 10.3 mg/dL 8.2(L) 8.6(L) 8.6(L)  Total Protein 6.5 - 8.1 g/dL 7.0 8.4(H) 7.2  Total Bilirubin 0.3 - 1.2 mg/dL 0.5 0.7 0.3  Alkaline Phos 38 - 126 U/L 131(H) 142(H) 99  AST 15 - 41 U/L 16 27 17   ALT 0 - 44 U/L 12 14 11     Urinalysis    Component Value Date/Time   COLORURINE AMBER (A) 11/05/2018 2247   APPEARANCEUR CLOUDY (A) 11/05/2018 2247    LABSPEC 1.040 (H) 11/05/2018 2247   PHURINE 5.0 11/05/2018 [place] NEGATIVE 11/05/2018 2247   HGBUR NEGATIVE 11/05/2018 2247   BILIRUBINUR NEGATIVE 11/05/2018 2247   KETONESUR 20 (A) 11/05/2018 2247   PROTEINUR 100 (A) 11/05/2018 2247   NITRITE NEGATIVE 11/05/2018 2247   LEUKOCYTESUR NEGATIVE 11/05/2018 2247      Blood and urine cultures pending.   Assessment/Plan: 1. Changed to Zosyn from Cefoxitin overnight for broader coverage due to new onset of febrile morbidity. Will continue to monitor WBC trend. No further fevers, Tmax was 101.2 at 1818. Pending urine and blood cultures.  2. Will advance to liquid diet as patient has had no further nausea/vomiting. Continue GI prophylaxis with Pepcid.  3. Continue IV pain meds until better tolerating PO meds. Began introducing PO pain meds overnight.  4. Continue antiemetics (Reglan/Phenergan/Scopolamine patch) as needed.  5. Patient is a smoker, ~ 1/2 ppd, declines nicotine patch at this time.  6. Continue Xarelto for anticoagulation.   Rubie Maid, MD [facility]

## 2018-11-07 ENCOUNTER — Inpatient Hospital Stay: Payer: Medicaid Other

## 2018-11-07 DIAGNOSIS — N760 Acute vaginitis: Secondary | ICD-10-CM

## 2018-11-07 DIAGNOSIS — Z9889 Other specified postprocedural states: Secondary | ICD-10-CM

## 2018-11-07 DIAGNOSIS — R112 Nausea with vomiting, unspecified: Secondary | ICD-10-CM | POA: Insufficient documentation

## 2018-11-07 HISTORY — DX: Other specified postprocedural states: Z98.890

## 2018-11-07 HISTORY — DX: Nausea with vomiting, unspecified: R11.2

## 2018-11-07 LAB — CBC WITH DIFFERENTIAL/PLATELET
Abs Immature Granulocytes: 0.13 10*3/uL — ABNORMAL HIGH (ref 0.00–0.07)
Basophils Absolute: 0.1 10*3/uL (ref 0.0–0.1)
Basophils Relative: 0 %
Eosinophils Absolute: 0.2 10*3/uL (ref 0.0–0.5)
Eosinophils Relative: 1 %
HCT: 32.3 % — ABNORMAL LOW (ref 36.0–46.0)
Hemoglobin: 9.7 g/dL — ABNORMAL LOW (ref 12.0–15.0)
Immature Granulocytes: 1 %
Lymphocytes Relative: 12 %
Lymphs Abs: 1.5 10*3/uL (ref 0.7–4.0)
MCH: 21.2 pg — ABNORMAL LOW (ref 26.0–34.0)
MCHC: 30 g/dL (ref 30.0–36.0)
MCV: 70.5 fL — ABNORMAL LOW (ref 80.0–100.0)
Monocytes Absolute: 1.6 10*3/uL — ABNORMAL HIGH (ref 0.1–1.0)
Monocytes Relative: 13 %
Neutro Abs: 9.2 10*3/uL — ABNORMAL HIGH (ref 1.7–7.7)
Neutrophils Relative %: 73 %
Platelets: 465 10*3/uL — ABNORMAL HIGH (ref 150–400)
RBC: 4.58 MIL/uL (ref 3.87–5.11)
RDW: 18.7 % — ABNORMAL HIGH (ref 11.5–15.5)
Smear Review: NORMAL
WBC: 12.7 10*3/uL — ABNORMAL HIGH (ref 4.0–10.5)
nRBC: 0 % (ref 0.0–0.2)

## 2018-11-07 MED ORDER — FENTANYL CITRATE (PF) 100 MCG/2ML IJ SOLN
INTRAMUSCULAR | Status: AC | PRN
  Administered 2018-11-07 (×2): 50 ug via INTRAVENOUS

## 2018-11-07 MED ORDER — SODIUM CHLORIDE 0.9% FLUSH
5.0000 mL | Freq: Three times a day (TID) | INTRAVENOUS | Status: DC
  Administered 2018-11-08 – 2018-11-10 (×3): 5 mL

## 2018-11-07 MED ORDER — IOHEXOL 300 MG/ML  SOLN
100.0000 mL | Freq: Once | INTRAMUSCULAR | Status: AC | PRN
  Administered 2018-11-07: 100 mL via INTRAVENOUS

## 2018-11-07 MED ORDER — MIDAZOLAM HCL 2 MG/2ML IJ SOLN
INTRAMUSCULAR | Status: AC | PRN
  Administered 2018-11-07 (×3): 1 mg via INTRAVENOUS

## 2018-11-07 MED ORDER — SODIUM CHLORIDE 0.9 % IV SOLN
INTRAVENOUS | Status: DC | PRN
  Administered 2018-11-07 – 2018-11-08 (×2): 250 mL via INTRAVENOUS
  Administered 2018-11-09: 500 mL via INTRAVENOUS

## 2018-11-07 NOTE — Progress Notes (Signed)
Patient to CT.

## 2018-11-07 NOTE — Progress Notes (Signed)
Hospital Day # 3, admitted for suspected vaginal cuff cellulitis, pelvic pain, nausea/vomiting   Subjective: Patient reports pain is getting somewhat better. Is able to ambulate in hallways. Is tolerating more PO meds, full liquid diet. Does report that her belly looks more bloated today. Denies any fevers, chills, nausea, vomiting. Patient notes that she is passing gas and has had a BM within the past 2 days.   Objective: Temp:  [98.2 F (36.8 C)-98.6 F (37 C)] 98.5 F (36.9 C) (11/05 0731) Pulse Rate:  [77-99] 80 (11/05 0731) Resp:  [16-20] 18 (11/05 0731) BP: (108-138)/(74-95) 108/74 (11/05 0731) SpO2:  [91 %-98 %] 97 % (11/05 0805)   Physical Exam:  General: alert and no distress  Lungs: clear to auscultation bilaterally Heart: regular rate and rhythm, S1, S2 normal, no murmur, click, rub or gallop Abdomen: soft. abnormal finding: mild to moderate tenderness in generalized abdomen on palpation, distention present.  Pelvis:Scant discharge.: None  Incision: healing well Extremities: DVT Evaluation: No evidence of DVT seen on physical exam. Negative Homan's sign. No cords or calf tenderness. No significant calf/ankle edema.   CBC Latest Ref Rng & Units 11/07/2018 11/06/2018 11/05/2018  WBC 4.0 - 10.5 K/uL 12.7(H) 14.7(H) 13.0(H)  Hemoglobin 12.0 - 15.0 g/dL 9.7(L) 10.6(L) 10.3(L)  Hematocrit 36.0 - 46.0 % 32.3(L) 33.7(L) 33.2(L)  Platelets 150 - 400 K/uL 465(H) 458(H) 434(H)     CMP Latest Ref Rng & Units 11/06/2018 11/04/2018 10/28/2018  Glucose 70 - 99 mg/dL 92 131(H) 185(H)  BUN 6 - 20 mg/dL 9 12 12   Creatinine 0.44 - 1.00 mg/dL 0.69 0.71 0.72  Sodium 135 - 145 mmol/L 137 136 136  Potassium 3.5 - 5.1 mmol/L 3.3(L) 3.1(L) 3.7  Chloride 98 - 111 mmol/L 99 96(L) 102  CO2 22 - 32 mmol/L 27 28 25   Calcium 8.9 - 10.3 mg/dL 8.2(L) 8.6(L) 8.6(L)  Total Protein 6.5 - 8.1 g/dL 7.0 8.4(H) 7.2  Total Bilirubin 0.3 - 1.2 mg/dL 0.5 0.7 0.3  Alkaline Phos 38 - 126 U/L 131(H) 142(H) 99   AST 15 - 41 U/L 16 27 17   ALT 0 - 44 U/L 12 14 11     Urinalysis    Component Value Date/Time   COLORURINE AMBER (A) 11/05/2018 2247   APPEARANCEUR CLOUDY (A) 11/05/2018 2247   LABSPEC 1.040 (H) 11/05/2018 2247   PHURINE 5.0 11/05/2018 [place] NEGATIVE 11/05/2018 2247   HGBUR NEGATIVE 11/05/2018 2247   BILIRUBINUR NEGATIVE 11/05/2018 2247   KETONESUR 20 (A) 11/05/2018 2247   PROTEINUR 100 (A) 11/05/2018 2247   NITRITE NEGATIVE 11/05/2018 2247   LEUKOCYTESUR NEGATIVE 11/05/2018 2247      Blood and urine cultures pending.   Assessment/Plan: 1. Currently on Zosyn for broader coverage.  Has remained afebrile for over 24 hrs. Urine culture and blood cultures so far negative.  Patient with more abdominal distention and now a decline in her hemoglobin. Cannot r/o intra-abdominal bleed, small bowel obstruction vs ileus, or abscess formation. Will order stat CT scan to assess.  2. Patient had tolerated diet up now, was advancing to soft diet this morning, however with no physical findings will make NPO until findings of CT scan resulted in case of necessary surgical intervention.  Continue GI prophylaxis with Pepcid.  3. Patient is tolerating more PO meds. . Using IV for breakthrough pain.  4. Patient refused Norvasc last night.  Continue to monitor BPs.  6. Continue Xarelto for anticoagulation.   Rubie Maid, MD [facility]

## 2018-11-07 NOTE — Procedures (Signed)
Interventional Radiology Procedure Note  Procedure: CT guided drain placement, 87F, with aspiration of 410 mL fould smelling purulent fliud.   Complications: None  Estimated Blood Loss: None  Recommendations: - Drain to JP - Flush Q shift - Cultures sent  Signed,  Criselda Peaches, MD

## 2018-11-07 NOTE — Progress Notes (Signed)
Subjective: Pt status post CT guided drain for postoperative intra-abdominal abscess at 4:25 pm. Drain was placed without complications with aspiration of 410 mL foul smelling purulent fluid. JP drain left in place. At this time approximately 50 mL serosanguineous fluid present in drain. States she is doing well overall. Notes abdominal pain around the area of the drain, as well as epigastric pain. She reports she has been belching and passing flatus. She denies any nausea or vomiting.   Objective: .  General: alert, cooperative and no distress Cardio: regular rate and rhythm, S1, S2 normal, no murmur, click, rub or gallop GI: Tender to palpation right and left lower quadrants, but soft. Drain is in place and dressing is clean. Upper abdomen is firm and tender with tympany on percussion.   Extremities: extremities normal, atraumatic, no cyanosis or edema, no lower extremity swelling or calf tenderness.   Assessment/Plan: Continue antibiotics, continue to trend labs Monitor JP drainage   LOS: 2 days    Dorris Fetch 11/07/2018, 5:29 PM

## 2018-11-07 NOTE — Progress Notes (Signed)
Patient clinically stable post Abscess Drain placement per DR Laurence Ferrari, vitals stable. Tolerated procedure well. Awake,alert and oriented post procedure. Report Called to [facility] RN on [phone] with questions answered. Denies complaints at this time.

## 2018-11-08 DIAGNOSIS — I81 Portal vein thrombosis: Secondary | ICD-10-CM

## 2018-11-08 DIAGNOSIS — K861 Other chronic pancreatitis: Secondary | ICD-10-CM

## 2018-11-08 DIAGNOSIS — I10 Essential (primary) hypertension: Secondary | ICD-10-CM

## 2018-11-08 DIAGNOSIS — Z7901 Long term (current) use of anticoagulants: Secondary | ICD-10-CM

## 2018-11-08 DIAGNOSIS — N739 Female pelvic inflammatory disease, unspecified: Secondary | ICD-10-CM

## 2018-11-08 LAB — CBC WITH DIFFERENTIAL/PLATELET
Abs Immature Granulocytes: 0.15 10*3/uL — ABNORMAL HIGH (ref 0.00–0.07)
Basophils Absolute: 0 10*3/uL (ref 0.0–0.1)
Basophils Relative: 0 %
Eosinophils Absolute: 0.1 10*3/uL (ref 0.0–0.5)
Eosinophils Relative: 1 %
HCT: 33.7 % — ABNORMAL LOW (ref 36.0–46.0)
Hemoglobin: 10.2 g/dL — ABNORMAL LOW (ref 12.0–15.0)
Immature Granulocytes: 1 %
Lymphocytes Relative: 10 %
Lymphs Abs: 1.3 10*3/uL (ref 0.7–4.0)
MCH: 21.3 pg — ABNORMAL LOW (ref 26.0–34.0)
MCHC: 30.3 g/dL (ref 30.0–36.0)
MCV: 70.2 fL — ABNORMAL LOW (ref 80.0–100.0)
Monocytes Absolute: 1.2 10*3/uL — ABNORMAL HIGH (ref 0.1–1.0)
Monocytes Relative: 9 %
Neutro Abs: 10 10*3/uL — ABNORMAL HIGH (ref 1.7–7.7)
Neutrophils Relative %: 79 %
Platelets: 512 10*3/uL — ABNORMAL HIGH (ref 150–400)
RBC: 4.8 MIL/uL (ref 3.87–5.11)
RDW: 18.9 % — ABNORMAL HIGH (ref 11.5–15.5)
Smear Review: NORMAL
WBC: 12.8 10*3/uL — ABNORMAL HIGH (ref 4.0–10.5)
nRBC: 0 % (ref 0.0–0.2)

## 2018-11-08 MED ORDER — METRONIDAZOLE 500 MG PO TABS
500.0000 mg | ORAL_TABLET | Freq: Two times a day (BID) | ORAL | Status: DC
  Administered 2018-11-08 – 2018-11-11 (×7): 500 mg via ORAL
  Filled 2018-11-08 (×8): qty 1

## 2018-11-08 MED ORDER — SULFAMETHOXAZOLE-TRIMETHOPRIM 800-160 MG PO TABS
1.0000 | ORAL_TABLET | Freq: Two times a day (BID) | ORAL | Status: DC
  Administered 2018-11-08 – 2018-11-11 (×7): 1 via ORAL
  Filled 2018-11-08 (×8): qty 1

## 2018-11-08 NOTE — Progress Notes (Signed)
Hospital Day # 4, admitted for pelvic abscess, POD#1 s/p CT-guided aspiration of abscess  Subjective: Patient still noting some pain but still "better than before". Is ambulating and voiding. Tolerating soft diet (although not eating much right now). Denies fevers, chills.   Objective: Temp:  [97.9 F (36.6 C)-98.8 F (37.1 C)] 98.1 F (36.7 C) (11/06 0745) Pulse Rate:  [83-104] 95 (11/06 0745) Resp:  [18-25] 18 (11/06 0745) BP: (114-132)/(71-104) 115/86 (11/06 0745) SpO2:  [92 %-100 %] 96 % (11/06 0745)   I/O last 3 completed shifts: In: 1006.5 [I.V.:760.6; IV Piggyback:245.9] Out: [id number] [Urine:1150; Drains:85] Total I/O In: -  Out: 300 [Urine:300]    Physical Exam:  General: alert and no distress  Lungs: clear to auscultation bilaterally Heart: regular rate and rhythm, S1, S2 normal, no murmur, click, rub or gallop Abdomen: soft. abnormal finding: mild tenderness in generalized abdomen on palpation, distention improved compared to yesterday. Drain with 50 cc of serosanguinous fluid. Drain appears clean and dry. Pelvis:None  Incision: healing well Extremities: DVT Evaluation: No evidence of DVT seen on physical exam. Negative Homan's sign. No cords or calf tenderness. No significant calf/ankle edema.    Labs:   CBC Latest Ref Rng & Units 11/08/2018 11/07/2018 11/06/2018  WBC 4.0 - 10.5 K/uL 12.8(H) 12.7(H) 14.7(H)  Hemoglobin 12.0 - 15.0 g/dL 10.2(L) 9.7(L) 10.6(L)  Hematocrit 36.0 - 46.0 % 33.7(L) 32.3(L) 33.7(L)  Platelets 150 - 400 K/uL 512(H) 465(H) 458(H)     CMP Latest Ref Rng & Units 11/06/2018 11/04/2018 10/28/2018  Glucose 70 - 99 mg/dL 92 131(H) 185(H)  BUN 6 - 20 mg/dL 9 12 12   Creatinine 0.44 - 1.00 mg/dL 0.69 0.71 0.72  Sodium 135 - 145 mmol/L 137 136 136  Potassium 3.5 - 5.1 mmol/L 3.3(L) 3.1(L) 3.7  Chloride 98 - 111 mmol/L 99 96(L) 102  CO2 22 - 32 mmol/L 27 28 25   Calcium 8.9 - 10.3 mg/dL 8.2(L) 8.6(L) 8.6(L)  Total Protein 6.5 - 8.1 g/dL 7.0 8.4(H)  7.2  Total Bilirubin 0.3 - 1.2 mg/dL 0.5 0.7 0.3  Alkaline Phos 38 - 126 U/L 131(H) 142(H) 99  AST 15 - 41 U/L 16 27 17   ALT 0 - 44 U/L 12 14 11     Urinalysis    Component Value Date/Time   COLORURINE AMBER (A) 11/05/2018 2247   APPEARANCEUR CLOUDY (A) 11/05/2018 2247   LABSPEC 1.040 (H) 11/05/2018 2247   PHURINE 5.0 11/05/2018 [place] NEGATIVE 11/05/2018 2247   HGBUR NEGATIVE 11/05/2018 2247   BILIRUBINUR NEGATIVE 11/05/2018 2247   KETONESUR 20 (A) 11/05/2018 2247   PROTEINUR 100 (A) 11/05/2018 2247   NITRITE NEGATIVE 11/05/2018 2247   LEUKOCYTESUR NEGATIVE 11/05/2018 2247      Results for orders placed or performed during the hospital encounter of 11/04/18  Culture, blood (routine x 2)   Specimen: BLOOD  Result Value Ref Range   Specimen Description BLOOD BLOOD RIGHT HAND    Special Requests      BOTTLES DRAWN AEROBIC AND ANAEROBIC Blood Culture adequate volume   Culture      NO GROWTH 2 DAYS Performed at [facility], [address] [place], [place] [address]    Report Status PENDING   Culture, blood (routine x 2)   Specimen: BLOOD  Result Value Ref Range   Specimen Description BLOOD BLOOD LEFT WRIST    Special Requests      BOTTLES DRAWN AEROBIC AND ANAEROBIC Blood Culture adequate volume   Culture  NO GROWTH 2 DAYS Performed at [facility], [address] [place], [place] [address]    Report Status PENDING   Culture, Urine   Specimen: Urine, Random  Result Value Ref Range   Specimen Description      URINE, RANDOM Performed at [facility], [address], [place], [place] [address]    Special Requests      NONE Performed at [facility], [address] [place], [place] [address]    Culture      NO GROWTH Performed at [facility], [place] [address], [place], [place] [address]    Report Status 11/06/2018 FINAL   Aerobic/Anaerobic Culture (surgical/deep wound)   Specimen: Abscess  Result  Value Ref Range   Specimen Description      ABSCESS Performed at [facility], [address] [place], [place] [address]    Special Requests      Normal Performed at [facility], [place], [place] [address]    Gram Stain      ABUNDANT WBC PRESENT, PREDOMINANTLY PMN MODERATE GRAM POSITIVE COCCI ABUNDANT GRAM NEGATIVE RODS FEW GRAM VARIABLE ROD Performed at [facility], [place] [address] [place],  [address]    Culture PENDING    Report Status PENDING      Imaging:  CT IMAGE GUIDED DRAINAGE BY PERCUTANEOUS CATHETER INDICATION: 43 year old female with postoperative abscess collection following hysterectomy.  EXAM: CT GUIDED DRAINAGE OF abdominopelvic ABSCESS  MEDICATIONS: The patient is currently admitted to the hospital and receiving intravenous antibiotics. The antibiotics were administered within an appropriate time frame prior to the initiation of the procedure.  ANESTHESIA/SEDATION: 4 mg IV Versed 100 mcg IV Fentanyl  Moderate Sedation Time: 20 minutes  The patient was continuously monitored during the procedure by the interventional radiology nurse under my direct supervision.  COMPLICATIONS: None immediate.  TECHNIQUE: Informed written consent was obtained from the patient after a thorough discussion of the procedural risks, benefits and alternatives. All questions were addressed. Maximal Sterile Barrier Technique was utilized including caps, mask, sterile gowns, sterile gloves, sterile drape, hand hygiene and skin antiseptic. A timeout was performed prior to the initiation of the procedure.  PROCEDURE: The operative field was prepped with Chlorhexidine in a sterile fashion, and a sterile drape was applied covering the operative field. A sterile gown and sterile gloves were used for the procedure. Local anesthesia was provided with 1% Lidocaine.  CT imaging was performed identifying a large fluid and  gas collection in the low anterior abdomen and pelvis. A suitable skin entry site was selected and marked. The overlying skin was sterilely prepped and draped in the standard fashion using chlorhexidine skin prep. Local anesthesia was attained by infiltration with 1% lidocaine. A small dermatotomy was made. Under intermittent CT guidance, an 18 gauge trocar needle was advanced through the midline of the rectus fascia and into the fluid and gas collection. A 0.035 wire was coiled in the collection. The needle was removed. The skin tract was dilated to 8 [place] and a Cook 12 French drainage catheter modified with additional sideholes was advanced over the wire and into the pelvis.  Drainage catheter was flushed with saline and connected to JP bulb suction. The catheter was secured to the skin with 0 Prolene suture. Post drain placement imaging demonstrates a well-positioned drainage catheter with near-total interval resolution of the abscess cavity.  FINDINGS: 410 mL foul-smelling purulent fluid.  IMPRESSION: Successful placement of a 12  French drainage catheter into the abdominopelvic abscess.  PLAN: 1. Maintain drain to JP bulb suction. 2. Flush drainage catheter once per shift. 3. When drain output is minimal (less than or equal to forward flush amount for 48 hours) drain can be removed. Interventional Radiology. 4. If patient is discharged with drain in place, please have them return to interventional radiology 1 week after discharge for drain injection and possible removal.  Signed,  Criselda Peaches, MD, RPVI  [facility]  [facility]  Electronically Signed   By: Jacqulynn Cadet M.D.   On: 11/07/2018 17:23 CT ABDOMEN PELVIS W CONTRAST CLINICAL DATA:  43 year old female status post hysterectomy 1 week ago. Patient presents with abdominal pain and distention.  EXAM: CT ABDOMEN AND PELVIS WITH  CONTRAST  TECHNIQUE: Multidetector CT imaging of the abdomen and pelvis was performed using the standard protocol following bolus administration of intravenous contrast.  CONTRAST:  175mL OMNIPAQUE IOHEXOL 300 MG/ML  SOLN  COMPARISON:  CT of the abdomen pelvis dated 11/04/2018  FINDINGS: Lower chest: Partially visualized small bilateral pleural effusions, new since the prior CT. There is associated partial compressive atelectasis of the lower lobes versus pneumonia.  No intra-abdominal free air. There is a small ascites, increased since the prior CT.  Hepatobiliary: The liver is unremarkable. There is mild periportal edema. No calcified gallstone.  Pancreas: Small coarse calcification of the uncinate process of the pancreas. There is an ill-defined area of hypodensity involving the neck of the pancreas (coronal series 5, image 43). This likely corresponds to the cystic changes seen in this region on the MRI of 05/31/2018. There is mild atrophy of the body and tail of the pancreas with mild dilatation of the main pancreatic duct measuring up to 6 mm.  Spleen: Normal in size without focal abnormality.  Adrenals/Urinary Tract: The adrenal glands are unremarkable. There is no hydronephrosis on either side. There is symmetric enhancement and excretion of contrast by both kidneys. The visualized ureters and urinary bladder appear unremarkable.  Stomach/Bowel: There is no bowel obstruction. Thickened appearance of the sigmoid colon, likely reactive to inflammatory/infectious process in the abdomen or pelvis. The appendix is unremarkable.  Vascular/Lymphatic: The abdominal aorta and IVC are unremarkable. 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 No portal venous gas. Several top-normal retroperitoneal lymph nodes, likely reactive.  Reproductive:  Hysterectomy.  Other: There is a complex fluid collection containing pockets of air within the pelvis measuring approximately 10 x 12 cm in greatest axial dimensions and 6.5 cm in craniocaudal length. There is enhancement of the walls of this collection. This is most consistent with developing abscess. There is diffuse edema throughout the mesentery and omentum. Smaller loculated appearing collection in the midline anterior lower abdomen/pelvis (series 2, image 67) measures approximately 4.2 x 1.4 cm.  Musculoskeletal: No acute or significant osseous findings.  IMPRESSION: 1. Complex fluid collection containing pockets of air within the pelvis most consistent with an infected collection/abscess. There is diffuse edema of the mesentery and omentum. Overall increase the size of the ascites since the prior CT. Clinical correlation is recommended to evaluate for peritonitis. 2. Small bilateral pleural effusions with associated partial compressive atelectasis of the lower lobes versus pneumonia. This finding is new since the prior CT. 3. Ill-defined hypodensity in the neck of the pancreas likely corresponds to the cystic changes seen on the MRI of 05/31/2018. There is mild atrophy  of the body and tail of the pancreas with mild dilatation of the main pancreatic duct. 4. Thickened appearance of the sigmoid colon, likely reactive to inflammatory/infectious process in the abdomen or pelvis. No bowel obstruction. Normal appendix.  Electronically Signed   By: Anner Crete M.D.   On: 11/07/2018 09:33   Assessment/Plan: 1. S/p drainage of pelvic abscess via CT guidance yesterday. Overall patient notes pain is improved, although still present.  Has been on Zosyn x 3 days due to intolerance to PO, however will now change to PO antibiotics (Bactrim DS and Flagyl). Continue to monitor. Urine culture and blood cultures remain negative. Abscess culture thus far with Gram negative and positive  rods.  Continue to follow WBC trend. Continue to monitor drain output.  2. Continue to advance diet.  Continue GI prophylaxis with Pepcid.  3. Continue PO meds. Using IV for breakthrough pain.  4.   Continue to monitor BPs, on Norvasc.  6. Continue Xarelto for anticoagulation. Held last night after procedure.  Patient is also ambulating much.   Possible d/c home tomorrow.   Rubie Maid, MD [facility]

## 2018-11-09 ENCOUNTER — Inpatient Hospital Stay: Payer: Medicaid Other

## 2018-11-09 LAB — CBC WITH DIFFERENTIAL/PLATELET
Abs Immature Granulocytes: 0.19 10*3/uL — ABNORMAL HIGH (ref 0.00–0.07)
Basophils Absolute: 0 10*3/uL (ref 0.0–0.1)
Basophils Relative: 0 %
Eosinophils Absolute: 0.1 10*3/uL (ref 0.0–0.5)
Eosinophils Relative: 1 %
HCT: 29.8 % — ABNORMAL LOW (ref 36.0–46.0)
Hemoglobin: 9.3 g/dL — ABNORMAL LOW (ref 12.0–15.0)
Immature Granulocytes: 2 %
Lymphocytes Relative: 13 %
Lymphs Abs: 1.6 10*3/uL (ref 0.7–4.0)
MCH: 21.3 pg — ABNORMAL LOW (ref 26.0–34.0)
MCHC: 31.2 g/dL (ref 30.0–36.0)
MCV: 68.2 fL — ABNORMAL LOW (ref 80.0–100.0)
Monocytes Absolute: 1.7 10*3/uL — ABNORMAL HIGH (ref 0.1–1.0)
Monocytes Relative: 13 %
Neutro Abs: 9.2 10*3/uL — ABNORMAL HIGH (ref 1.7–7.7)
Neutrophils Relative %: 71 %
Platelets: 557 10*3/uL — ABNORMAL HIGH (ref 150–400)
RBC: 4.37 MIL/uL (ref 3.87–5.11)
RDW: 19 % — ABNORMAL HIGH (ref 11.5–15.5)
Smear Review: NORMAL
WBC: 12.9 10*3/uL — ABNORMAL HIGH (ref 4.0–10.5)
nRBC: 0 % (ref 0.0–0.2)

## 2018-11-09 LAB — CREATININE, SERUM
Creatinine, Ser: 0.56 mg/dL (ref 0.44–1.00)
GFR calc Af Amer: >60 mL/min (ref >60)
GFR calc non Af Amer: >60 mL/min (ref >60)

## 2018-11-09 MED ORDER — HYDROMORPHONE HCL 2 MG PO TABS
4.0000 mg | ORAL_TABLET | ORAL | Status: DC | PRN
  Administered 2018-11-09 – 2018-11-11 (×12): 4 mg via ORAL
  Filled 2018-11-09 (×13): qty 2

## 2018-11-09 NOTE — Progress Notes (Signed)
Hospital Day # 5, admitted for pelvic abscess, POD#2 s/p CT-guided aspiration of abscess  Subjective: Patient still noting some pain but still "better than before". Is ambulating and voiding. Tolerating soft diet (although not eating much right now). Denies fevers, chills.   Objective: Temp:  [98.1 F (36.7 C)-98.6 F (37 C)] 98.5 F (36.9 C) (11/07 0757) Pulse Rate:  [80-95] 95 (11/07 0846) Resp:  [18-20] 18 (11/07 0054) BP: (117-122)/(77-86) 118/77 (11/07 0757) SpO2:  [94 %-99 %] 96 % (11/07 0846)   I/O last 3 completed shifts: In: 321.6 [P.O.:120; I.V.:51.6; IV Piggyback:150] Out: 1505 [Urine:1400; Drains:105] No intake/output data recorded.    Physical Exam:  General: alert and no distress  Lungs: clear to auscultation bilaterally Heart: regular rate and rhythm, S1, S2 normal, no murmur, click, rub or gallop Abdomen: soft. abnormal finding: pain in RUQ on palpation, non-distended.  Pelvis:None  Incision: healing well Extremities: DVT Evaluation: No evidence of DVT seen on physical exam. Negative Homan's sign. No cords or calf tenderness. No significant calf/ankle edema.    Labs:   CBC Latest Ref Rng & Units 11/09/2018 11/08/2018 11/07/2018  WBC 4.0 - 10.5 K/uL 12.9(H) 12.8(H) 12.7(H)  Hemoglobin 12.0 - 15.0 g/dL 9.3(L) 10.2(L) 9.7(L)  Hematocrit 36.0 - 46.0 % 29.8(L) 33.7(L) 32.3(L)  Platelets 150 - 400 K/uL 557(H) 512(H) 465(H)     CMP Latest Ref Rng & Units 11/09/2018 11/06/2018 11/04/2018  Glucose 70 - 99 mg/dL - 92 131(H)  BUN 6 - 20 mg/dL - 9 12  Creatinine 0.44 - 1.00 mg/dL 0.56 0.69 0.71  Sodium 135 - 145 mmol/L - 137 136  Potassium 3.5 - 5.1 mmol/L - 3.3(L) 3.1(L)  Chloride 98 - 111 mmol/L - 99 96(L)  CO2 22 - 32 mmol/L - 27 28  Calcium 8.9 - 10.3 mg/dL - 8.2(L) 8.6(L)  Total Protein 6.5 - 8.1 g/dL - 7.0 8.4(H)  Total Bilirubin 0.3 - 1.2 mg/dL - 0.5 0.7  Alkaline Phos 38 - 126 U/L - 131(H) 142(H)  AST 15 - 41 U/L - 16 27  ALT 0 - 44 U/L - 12 14     Urinalysis    Component Value Date/Time   COLORURINE AMBER (A) 11/05/2018 2247   APPEARANCEUR CLOUDY (A) 11/05/2018 2247   LABSPEC 1.040 (H) 11/05/2018 2247   PHURINE 5.0 11/05/2018 [place] NEGATIVE 11/05/2018 2247   [place] NEGATIVE 11/05/2018 2247   [place] NEGATIVE 11/05/2018 2247   KETONESUR 20 (A) 11/05/2018 2247   PROTEINUR 100 (A) 11/05/2018 2247   NITRITE NEGATIVE 11/05/2018 2247   LEUKOCYTESUR NEGATIVE 11/05/2018 2247      Results for orders placed or performed during the hospital encounter of 11/04/18  Culture, blood (routine x 2)   Specimen: BLOOD  Result Value Ref Range   Specimen Description BLOOD BLOOD RIGHT HAND    Special Requests      BOTTLES DRAWN AEROBIC AND ANAEROBIC Blood Culture adequate volume   Culture      NO GROWTH 2 DAYS Performed at [facility], [address], [place], [place] [address]    Report Status PENDING   Culture, blood (routine x 2)   Specimen: BLOOD  Result Value Ref Range   Specimen Description BLOOD BLOOD LEFT WRIST    Special Requests      BOTTLES DRAWN AEROBIC AND ANAEROBIC Blood Culture adequate volume   Culture      NO GROWTH 2 DAYS Performed at [facility], [address] [place], [place] [address]  Report Status PENDING   Culture, Urine   Specimen: Urine, Random  Result Value Ref Range   Specimen Description      URINE, RANDOM Performed at [facility], [address] [place], [place] [address]    Special Requests      NONE Performed at [facility], [address], [place], [place] [address]    Culture      NO GROWTH Performed at [facility], [place] [address], [place], [place] [address]    Report Status 11/06/2018 FINAL   Aerobic/Anaerobic Culture (surgical/deep wound)   Specimen: Abscess  Result Value Ref Range   Specimen Description      ABSCESS Performed at [facility], [address] [place], [place] [address]    Special  Requests      Normal Performed at [facility], [place], [place] [address]    Gram Stain      ABUNDANT WBC PRESENT, PREDOMINANTLY PMN MODERATE GRAM POSITIVE COCCI ABUNDANT GRAM NEGATIVE RODS FEW GRAM VARIABLE ROD Performed at [facility], [place] [address] [place], [place] [address]    Culture PENDING    Report Status PENDING      Imaging:  CT IMAGE GUIDED DRAINAGE BY PERCUTANEOUS CATHETER INDICATION: 43 year old female with postoperative abscess collection following hysterectomy.  EXAM: CT GUIDED DRAINAGE OF abdominopelvic ABSCESS  MEDICATIONS: The patient is currently admitted to the hospital and receiving intravenous antibiotics. The antibiotics were administered within an appropriate time frame prior to the initiation of the procedure.  ANESTHESIA/SEDATION: 4 mg IV Versed 100 mcg IV Fentanyl  Moderate Sedation Time: 20 minutes  The patient was continuously monitored during the procedure by the interventional radiology nurse under my direct supervision.  COMPLICATIONS: None immediate.  TECHNIQUE: Informed written consent was obtained from the patient after a thorough discussion of the procedural risks, benefits and alternatives. All questions were addressed. Maximal Sterile Barrier Technique was utilized including caps, mask, sterile gowns, sterile gloves, sterile drape, hand hygiene and skin antiseptic. A timeout was performed prior to the initiation of the procedure.  PROCEDURE: The operative field was prepped with Chlorhexidine in a sterile fashion, and a sterile drape was applied covering the operative field. A sterile gown and sterile gloves were used for the procedure. Local anesthesia was provided with 1% Lidocaine.  CT imaging was performed identifying a large fluid and gas collection in the low anterior abdomen and pelvis. A suitable skin entry site was selected and marked. The overlying skin was sterilely prepped  and draped in the standard fashion using chlorhexidine skin prep. Local anesthesia was attained by infiltration with 1% lidocaine. A small dermatotomy was made. Under intermittent CT guidance, an 18 gauge trocar needle was advanced through the midline of the rectus fascia and into the fluid and gas collection. A 0.035 wire was coiled in the collection. The needle was removed. The skin tract was dilated to 30 [place] and a Cook 12 French drainage catheter modified with additional sideholes was advanced over the wire and into the pelvis.  Drainage catheter was flushed with saline and connected to JP bulb suction. The catheter was secured to the skin with 0 Prolene suture. Post drain placement imaging demonstrates a well-positioned drainage catheter with near-total interval resolution of the abscess cavity.  FINDINGS: 410 mL foul-smelling purulent fluid.  IMPRESSION: Successful placement of a 12 French drainage catheter into the abdominopelvic abscess.  PLAN: 1. Maintain drain to JP bulb suction. 2. Flush drainage  catheter once per shift. 3. When drain output is minimal (less than or equal to forward flush amount for 48 hours) drain can be removed. Interventional Radiology. 4. If patient is discharged with drain in place, please have them return to interventional radiology 1 week after discharge for drain injection and possible removal.  Signed,  Criselda Peaches, MD, RPVI  [facility]  [facility]  Electronically Signed   By: Jacqulynn Cadet M.D.   On: 11/07/2018 17:23 CT ABDOMEN PELVIS W CONTRAST CLINICAL DATA:  43 year old female status post hysterectomy 1 week ago. Patient presents with abdominal pain and distention.  EXAM: CT ABDOMEN AND PELVIS WITH CONTRAST  TECHNIQUE: Multidetector CT imaging of the abdomen and pelvis was performed using the standard protocol following bolus administration of intravenous  contrast.  CONTRAST:  155mL OMNIPAQUE IOHEXOL 300 MG/ML  SOLN  COMPARISON:  CT of the abdomen pelvis dated 11/04/2018  FINDINGS: Lower chest: Partially visualized small bilateral pleural effusions, new since the prior CT. There is associated partial compressive atelectasis of the lower lobes versus pneumonia.  No intra-abdominal free air. There is a small ascites, increased since the prior CT.  Hepatobiliary: The liver is unremarkable. There is mild periportal edema. No calcified gallstone.  Pancreas: Small coarse calcification of the uncinate process of the pancreas. There is an ill-defined area of hypodensity involving the neck of the pancreas (coronal series 5, image 43). This likely corresponds to the cystic changes seen in this region on the MRI of 05/31/2018. There is mild atrophy of the body and tail of the pancreas with mild dilatation of the main pancreatic duct measuring up to 6 mm.  Spleen: Normal in size without focal abnormality.  Adrenals/Urinary Tract: The adrenal glands are unremarkable. There is no hydronephrosis on either side. There is symmetric enhancement and excretion of contrast by both kidneys. The visualized ureters and urinary bladder appear unremarkable.  Stomach/Bowel: There is no bowel obstruction. Thickened appearance of the sigmoid colon, likely reactive to inflammatory/infectious process in the abdomen or pelvis. The appendix is unremarkable.  Vascular/Lymphatic: The abdominal aorta and IVC are unremarkable. 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 No portal venous gas. Several top-normal retroperitoneal lymph nodes, likely reactive.  Reproductive: Hysterectomy.  Other: There is a complex fluid collection containing pockets of air within the pelvis measuring approximately 10 x 12 cm in greatest axial dimensions and 6.5 cm in  craniocaudal length. There is enhancement of the walls of this collection. This is most consistent with developing abscess. There is diffuse edema throughout the mesentery and omentum. Smaller loculated appearing collection in the midline anterior lower abdomen/pelvis (series 2, image 67) measures approximately 4.2 x 1.4 cm.  Musculoskeletal: No acute or significant osseous findings.  IMPRESSION: 1. Complex fluid collection containing pockets of air within the pelvis most consistent with an infected collection/abscess. There is diffuse edema of the mesentery and omentum. Overall increase the size of the ascites since the prior CT. Clinical correlation is recommended to evaluate for peritonitis. 2. Small bilateral pleural effusions with associated partial compressive atelectasis of the lower lobes versus pneumonia. This finding is new since the prior CT. 3. Ill-defined hypodensity in the neck of the pancreas likely corresponds to the cystic changes seen on the MRI of 05/31/2018. There is mild atrophy of the body and tail of the pancreas with mild dilatation of the main pancreatic duct. 4. Thickened appearance  of the sigmoid colon, likely reactive to inflammatory/infectious process in the abdomen or pelvis. No bowel obstruction. Normal appendix.  Electronically Signed   By: Anner Crete M.D.   On: 11/07/2018 09:33   Assessment/Plan: 1. S/p drainage of pelvic abscess via CT guidance 2 days ago.  Patient still with pain, however has worsened overnight. On PO antibiotics (Bactrim DS and Flagyl) started yesterday. No change in her WBC count. Continue to monitor. Urine culture and blood cultures remain negative. Abscess culture thus far with Gram negative and positive rods.  Drain output minimal, no purulent drainage. Continue to monitor drain output.  2. Regular diet. Will be NPO though until ultrasound.  3. Continue PO meds. Using IV for breakthrough pain.  4.   Continue to monitor  BPs, on Norvasc.  6. Continue Xarelto for anticoagulation. Concern as patient's Xarelto was accidentally held again last night for new thrombus as patient with RUQ pain. Will order RUQ ultrasound.    Rubie Maid, MD [facility]

## 2018-11-09 NOTE — Progress Notes (Signed)
Pt returned to room from outside.

## 2018-11-09 NOTE — Progress Notes (Signed)
Pt returned to room from procedure.  

## 2018-11-09 NOTE — Progress Notes (Signed)
Pt. C/o Nausea and scheduled Phenergan administered at 2022 and Pt. Stated relief of pain and Nausea. She is appears to be resting well and in NAD.

## 2018-11-09 NOTE — Progress Notes (Signed)
Pt seen entering room at 1712.

## 2018-11-09 NOTE — Progress Notes (Signed)
Pt returned to room per Palma Holter, RN at [phone]. When in hall pt ambulating in hall toward outside door. Pt asked to return to room. Pt refused and states she is going down to smoke and will not return to room.

## 2018-11-09 NOTE — Progress Notes (Signed)
Pt asked not to leave the floor. Upon rounding at 1708 pt not in room.

## 2018-11-10 LAB — CULTURE, BLOOD (ROUTINE X 2)
Culture: NO GROWTH
Culture: NO GROWTH
Special Requests: ADEQUATE
Special Requests: ADEQUATE

## 2018-11-10 LAB — CBC WITH DIFFERENTIAL/PLATELET
Abs Immature Granulocytes: 0.11 10*3/uL — ABNORMAL HIGH (ref 0.00–0.07)
Basophils Absolute: 0 10*3/uL (ref 0.0–0.1)
Basophils Relative: 0 %
Eosinophils Absolute: 0.1 10*3/uL (ref 0.0–0.5)
Eosinophils Relative: 1 %
HCT: 29.9 % — ABNORMAL LOW (ref 36.0–46.0)
Hemoglobin: 9.1 g/dL — ABNORMAL LOW (ref 12.0–15.0)
Immature Granulocytes: 1 %
Lymphocytes Relative: 11 %
Lymphs Abs: 1.3 10*3/uL (ref 0.7–4.0)
MCH: 21.4 pg — ABNORMAL LOW (ref 26.0–34.0)
MCHC: 30.4 g/dL (ref 30.0–36.0)
MCV: 70.4 fL — ABNORMAL LOW (ref 80.0–100.0)
Monocytes Absolute: 1.6 10*3/uL — ABNORMAL HIGH (ref 0.1–1.0)
Monocytes Relative: 14 %
Neutro Abs: 8.6 10*3/uL — ABNORMAL HIGH (ref 1.7–7.7)
Neutrophils Relative %: 73 %
Platelets: 568 10*3/uL — ABNORMAL HIGH (ref 150–400)
RBC: 4.25 MIL/uL (ref 3.87–5.11)
RDW: 19.2 % — ABNORMAL HIGH (ref 11.5–15.5)
Smear Review: NORMAL
WBC: 11.7 10*3/uL — ABNORMAL HIGH (ref 4.0–10.5)
nRBC: 0 % (ref 0.0–0.2)

## 2018-11-10 MED ORDER — METRONIDAZOLE 500 MG PO TABS: 500.0000 mg | ORAL_TABLET | Freq: Two times a day (BID) | ORAL | 0 refills | Status: DC

## 2018-11-10 MED ORDER — FERROUS SULFATE 325 (65 FE) MG PO TABS: 325.0000 mg | ORAL_TABLET | Freq: Every day | ORAL | 1 refills | Status: DC

## 2018-11-10 MED ORDER — HYDROMORPHONE HCL 4 MG PO TABS: 4.0000 mg | ORAL_TABLET | Freq: Four times a day (QID) | ORAL | 0 refills | Status: DC | PRN

## 2018-11-10 MED ORDER — PROMETHAZINE HCL 25 MG PO TABS: 25.0000 mg | ORAL_TABLET | Freq: Four times a day (QID) | ORAL | 0 refills | Status: DC | PRN

## 2018-11-10 MED ORDER — SULFAMETHOXAZOLE-TRIMETHOPRIM 800-160 MG PO TABS: 1.0000 | ORAL_TABLET | Freq: Two times a day (BID) | ORAL | 0 refills | Status: DC

## 2018-11-10 MED ORDER — FAMOTIDINE 20 MG PO TABS
20.0000 mg | ORAL_TABLET | Freq: Every day | ORAL | Status: DC
  Administered 2018-11-10 – 2018-11-11 (×2): 20 mg via ORAL
  Filled 2018-11-10 (×2): qty 1

## 2018-11-10 NOTE — Progress Notes (Signed)
Hospital Day # 6, admitted for pelvic abscess, POD#3 s/p CT-guided aspiration of abscess  Subjective: Patient still reports "bouts of pain", mostly at night, but otherwise doing good.  Is ambulating and voiding. Had some nausea last night but still tolerating regular diet. Denies fevers, chills.   Objective: Temp:  [98.3 F (36.8 C)-99.4 F (37.4 C)] 99 F (37.2 C) (11/08 1122) Pulse Rate:  [84-101] 93 (11/08 1122) Resp:  [18-20] 18 (11/08 1122) BP: (123-133)/(79-88) 133/88 (11/08 1122) SpO2:  [93 %-98 %] 98 % (11/08 0857)   I/O last 3 completed shifts: In: 343.2 [P.O.:240; I.V.:93.2; Other:10] Out: 692 [Urine:600; Drains:92] No intake/output data recorded.    Physical Exam:  General: alert and no distress  Lungs: clear to auscultation bilaterally Heart: regular rate and rhythm, S1, S2 normal, no murmur, click, rub or gallop Abdomen: soft. abnormal finding: pain in middle abdomen, non-distended.  Pelvis:None  Incision: healing well Extremities: DVT Evaluation: No evidence of DVT seen on physical exam. Negative Homan's sign. No cords or calf tenderness. No significant calf/ankle edema.    Labs:   CBC Latest Ref Rng & Units 11/10/2018 11/09/2018 11/08/2018  WBC 4.0 - 10.5 K/uL 11.7(H) 12.9(H) 12.8(H)  Hemoglobin 12.0 - 15.0 g/dL 9.1(L) 9.3(L) 10.2(L)  Hematocrit 36.0 - 46.0 % 29.9(L) 29.8(L) 33.7(L)  Platelets 150 - 400 K/uL 568(H) 557(H) 512(H)     CMP Latest Ref Rng & Units 11/09/2018 11/06/2018 11/04/2018  Glucose 70 - 99 mg/dL - 92 131(H)  BUN 6 - 20 mg/dL - 9 12  Creatinine 0.44 - 1.00 mg/dL 0.56 0.69 0.71  Sodium 135 - 145 mmol/L - 137 136  Potassium 3.5 - 5.1 mmol/L - 3.3(L) 3.1(L)  Chloride 98 - 111 mmol/L - 99 96(L)  CO2 22 - 32 mmol/L - 27 28  Calcium 8.9 - 10.3 mg/dL - 8.2(L) 8.6(L)  Total Protein 6.5 - 8.1 g/dL - 7.0 8.4(H)  Total Bilirubin 0.3 - 1.2 mg/dL - 0.5 0.7  Alkaline Phos 38 - 126 U/L - 131(H) 142(H)  AST 15 - 41 U/L - 16 27  ALT 0 - 44 U/L - 12 14      Imaging:  US PELVIS LIMITED (TRANSABDOMINAL ONLY) CLINICAL DATA:  Status post drainage of a pelvic abscess 2 days ago.  EXAM: LIMITED ULTRASOUND OF PELVIS  TECHNIQUE: Limited transabdominal ultrasound examination of the pelvis was performed.  COMPARISON:  Pelvic ultrasound 09/04/2018, CT abdomen pelvis 11/07/2018  FINDINGS: Adjacent to the drainage catheter in the midline pelvis there is a mildly complex collection measuring 5.4 x 4.3 by 4.7 cm without internal blood flow, likely representing residual fluid collection. This previously measured up to 12 cm.  IMPRESSION: Adjacent to the drainage catheter in the midline pelvis there is a complex collection measuring up to 5.4 cm likely representing residual abscess.  Electronically Signed   By: Audie Pinto M.D.   On: 11/09/2018 16:04 US Abdomen Limited RUQ CLINICAL DATA:  Right upper quadrant pain.  EXAM: ULTRASOUND ABDOMEN LIMITED RIGHT UPPER QUADRANT  COMPARISON:  CT abdomen pelvis 11/07/2018  FINDINGS: Gallbladder:  No gallstones or wall thickening visualized. No sonographic Murphy sign noted by sonographer.  Common bile duct:  Diameter: 0.4 cm  Liver:  No focal lesion identified. Within normal limits in parenchymal echogenicity. Appearance of the portal vein suggestive of cavernous transformation as seen on prior CT.  Other: Mild perihepatic ascites.  IMPRESSION: 1. 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  2.  Mild perihepatic  ascites.  3. No other finding to explain the patient's right upper quadrant pain.  Electronically Signed   By: Audie Pinto M.D.   On: 11/09/2018 16:00   Assessment/Plan: 1. S/p drainage of pelvic abscess via CT guidance 3 days ago.  Repeat ultrasound noted smaller residual abscess (5 cm, compared with initial 10 x 12 cm).  On PO antibiotics (Bactrim DS and Flagyl), WBC now trending down. Drain  output minimal, no purulent drainage. Will remove today.   2. Regular diet. Occasional nausea manageable with Phenergan.  3. Continue PO meds for pain.  Ultrasound performed yesterday due to significant RUQ pain was normal. .  4.   Continue to monitor BPs, on Norvasc.  6. Continue Xarelto for anticoagulation.  7. Patient was initially deemed stable for discharge, however after discussion of discharge management with patient, she then admitted to me that she was technically homeless, and requested to stay 1 more night, as she had arranged for a place for her daughter to stay for the night, but did not have a place for herself.  Notes that she is working now and has some money, but has been unable to find an apartment and so has been living with an occasional friend or family, but tries not to stay too long so that they don't "wear out their welcome". Most of her family is in [place] where she moved from in March.  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 Patient is very tearful to admit this. Acknowledged patient's concerns, will allow to stay overnight. Will also have Social Work consulted.     Rubie Maid, MD [facility]

## 2018-11-11 LAB — CBC WITH DIFFERENTIAL/PLATELET
Abs Immature Granulocytes: 0.12 10*3/uL — ABNORMAL HIGH (ref 0.00–0.07)
Basophils Absolute: 0 10*3/uL (ref 0.0–0.1)
Basophils Relative: 0 %
Eosinophils Absolute: 0.1 10*3/uL (ref 0.0–0.5)
Eosinophils Relative: 1 %
HCT: 30.5 % — ABNORMAL LOW (ref 36.0–46.0)
Hemoglobin: 9.6 g/dL — ABNORMAL LOW (ref 12.0–15.0)
Immature Granulocytes: 1 %
Lymphocytes Relative: 10 %
Lymphs Abs: 1.1 10*3/uL (ref 0.7–4.0)
MCH: 21.1 pg — ABNORMAL LOW (ref 26.0–34.0)
MCHC: 31.5 g/dL (ref 30.0–36.0)
MCV: 67.2 fL — ABNORMAL LOW (ref 80.0–100.0)
Monocytes Absolute: 1.3 10*3/uL — ABNORMAL HIGH (ref 0.1–1.0)
Monocytes Relative: 12 %
Neutro Abs: 8.3 10*3/uL — ABNORMAL HIGH (ref 1.7–7.7)
Neutrophils Relative %: 76 %
Platelets: 598 10*3/uL — ABNORMAL HIGH (ref 150–400)
RBC: 4.54 MIL/uL (ref 3.87–5.11)
RDW: 19.3 % — ABNORMAL HIGH (ref 11.5–15.5)
Smear Review: NORMAL
WBC: 10.9 10*3/uL — ABNORMAL HIGH (ref 4.0–10.5)
nRBC: 0 % (ref 0.0–0.2)

## 2018-11-11 LAB — AEROBIC/ANAEROBIC CULTURE W GRAM STAIN (SURGICAL/DEEP WOUND): Special Requests: NORMAL

## 2018-11-11 NOTE — Progress Notes (Signed)
[facility] D/C'd due to Pt. C/O Tenderness at site. Site without s/s complications.

## 2018-11-11 NOTE — TOC Transition Note (Signed)
Transition of Care [facility]) - CM/SW Discharge Note   Patient Details  Name: Amy Wolfe MRN: [id number] Date of Birth: [birth year]  [facility] CM/SW Contact:  Modine Oppenheimer, Lenice Llamas Phone Number: [phone]  11/11/2018, 4:25 PM   Clinical Narrative: Clinical Social Worker ([place]) received consult for homeless issues. Per RN patient is stable for D/C today. CSW met with patient prior to D/C today. Patient was alert and oriented X4 and was sitting up in the bed. CSW introduced self and explained role of CSW department. Patient reported that she moved to [facility] from [place], [place] with [name]. Per patient she was making $24 per hour in [place] and did not have housing issues. Per patient she moved to [place] to be closer to her family because of her decline in health. Patient reported that her family said they would help her however when she arrived in [place] that was not the case. Per patient she has been in [place] since May 2020. Patient reported that she and her daughter stay with a different family member or friend every night. Per patient she and her daughter sometimes sleep in her car. Patient reported that all their belongings including clothes are in her car. Patient reported that she works full time at a [employer] in [place]. Patient reported that she and her daughter have medicaid and food stamps. Per patient she has transferred their medicaid over to [facility]. Mother reported that she has tried to apply for apartments however she is being told she does not have the income requirement and needs 3 months rent upfront. Per patient she has applied for housing with the [employer]. CSW provided emotional support. CSW also provided patient with a list of [place] resources for food, shelter and transportation. Per patient they have food and transportation. Per patient her daughter is doing remote learning and has a hot spot provided by the  school and a laptop. CSW made patient aware that the only emergency housing available is the shelter. Patient reported that she will not stay at the shelter and will make it work. Patient accepted resources and thanked CSW for visit. Per patient her friend will pick her up today from [facility].    CSW discussed case with [facility] supervisor and it was decided that a child protective services (CPS) report is needed. CSW made an [place] report today. Please reconsult if future social work needs arise. CSW signing off.      Final next level of care: Home/Self Care Barriers to Discharge: Barriers Resolved   Patient Goals and CMS Choice Patient states their goals for this hospitalization and ongoing recovery are:: Pain control.      Discharge Placement                       Discharge Plan and Services In-house Referral: Clinical Social Work              DME Arranged: N/A         HH Arranged: NA          Social Determinants of Health (SDOH) Interventions     Readmission Risk Interventions No flowsheet data found.

## 2018-11-11 NOTE — Progress Notes (Signed)
Mel Almond, Social Worker called and made aware of pts need for social work consult for discharge. Mel Almond states pt is on her list and will come up to see pt after her rounds today as soon as she is able to.

## 2018-11-11 NOTE — Progress Notes (Signed)
Hospital Day # 7, admitted for pelvic abscess, POD#4 s/p CT-guided aspiration of abscess  Subjective: Patient with no major complaints. Tolerating regular diet. Denies fevers, chills. Pain is controlled with meds.   Objective: Temp:  [98.4 F (36.9 C)-99.4 F (37.4 C)] 99 F (37.2 C) (11/09 0823) Pulse Rate:  [86-106] 88 (11/09 0823) Resp:  [16-18] 18 (11/09 0823) BP: (120-137)/(83-88) 127/83 (11/09 0823) SpO2:  [91 %-98 %] 98 % (11/09 0823)   I/O last 3 completed shifts: In: 353.2 [P.O.:240; I.V.:93.2; Other:20] Out: 59 [Drains:49] Total I/O In: 120 [P.O.:120] Out: -     Physical Exam:  General: alert and no distress  Lungs: clear to auscultation bilaterally Heart: regular rate and rhythm, S1, S2 normal, no murmur, click, rub or gallop Abdomen: soft.  Mildly tender in lower abdomen.  Drain in place, site clean and dry. Pelvis:None  Incision: healing well Extremities: DVT Evaluation: No evidence of DVT seen on physical exam. Negative Homan's sign. No cords or calf tenderness. No significant calf/ankle edema.    Labs:   CBC Latest Ref Rng & Units 11/11/2018 11/10/2018 11/09/2018  WBC 4.0 - 10.5 K/uL 10.9(H) 11.7(H) 12.9(H)  Hemoglobin 12.0 - 15.0 g/dL 9.6(L) 9.1(L) 9.3(L)  Hematocrit 36.0 - 46.0 % 30.5(L) 29.9(L) 29.8(L)  Platelets 150 - 400 K/uL 598(H) 568(H) 557(H)     CMP Latest Ref Rng & Units 11/09/2018 11/06/2018 11/04/2018  Glucose 70 - 99 mg/dL - 92 131(H)  BUN 6 - 20 mg/dL - 9 12  Creatinine 0.44 - 1.00 mg/dL 0.56 0.69 0.71  Sodium 135 - 145 mmol/L - 137 136  Potassium 3.5 - 5.1 mmol/L - 3.3(L) 3.1(L)  Chloride 98 - 111 mmol/L - 99 96(L)  CO2 22 - 32 mmol/L - 27 28  Calcium 8.9 - 10.3 mg/dL - 8.2(L) 8.6(L)  Total Protein 6.5 - 8.1 g/dL - 7.0 8.4(H)  Total Bilirubin 0.3 - 1.2 mg/dL - 0.5 0.7  Alkaline Phos 38 - 126 U/L - 131(H) 142(H)  AST 15 - 41 U/L - 16 27  ALT 0 - 44 U/L - 12 14     Assessment/Plan: 1. S/p drainage of pelvic abscess via CT guidance  4 days ago.  Repeat ultrasound noted smaller residual abscess (5 cm, compared with initial 10 x 12 cm).  On PO antibiotics (Bactrim DS and Flagyl), WBC now trending down. Drain output minimal, no purulent drainage. Removed today.   2. Regular diet. Occasional nausea manageable with Phenergan.  3. Continue PO meds for pain.   4. Continue to monitor BPs, on Norvasc.  6. Continue Xarelto for anticoagulation.  7. Pending Social Work consultation for homelessness.  Can d/c home after consult. To f/u in office on Friday.     Rubie Maid, MD [facility]

## 2018-11-11 NOTE — Progress Notes (Signed)
Discharge instructions, prescriptions, education, and appointments given and explained. Pt explained to the importance of taking all antibiotics and general education discussed. Pt verbalized understanding with no further questions. Pt awaiting ride.

## 2018-11-11 NOTE — Progress Notes (Signed)
JP drain removed by Dr. Marcelline Mates at bedside. Steri strips, and bandaid placed over site. C/D/I

## 2018-11-11 NOTE — Discharge Instructions (Signed)
Acute Pain, Adult Acute pain is a type of pain that may last for just a few days or as long as six months. It is often related to an illness, injury, or medical procedure. Acute pain may be mild, moderate, or severe. It usually goes away once your injury has healed or you are no longer ill. Pain can make it hard for you to do daily activities. It can cause anxiety and lead to other problems if left untreated. Treatment depends on the cause and severity of your acute pain. Follow these instructions at home:  Check your pain level as told by your health care provider.  Take over-the-counter and prescription medicines only as told by your health care provider.  If you are taking prescription pain medicine: ? Ask your health care provider about taking a stool softener or laxative to prevent constipation. ? Do not stop taking the medicine suddenly. Talk to your health care provider about how and when to discontinue prescription pain medicine. ? If your pain is severe, do not take more pills than instructed by your health care provider. ? Do not take other over-the-counter pain medicines in addition to this medicine unless told by your health care provider. ? Do not drive or operate heavy machinery while taking prescription pain medicine.  Apply ice or heat as told by your health care provider. These may reduce swelling and pain.  Ask your health care provider if other strategies such as distraction, relaxation, or physical therapies can help your pain.  Keep all follow-up visits as told by your health care provider. This is important. Contact a health care provider if:  You have pain that is not controlled by medicine.  Your pain does not improve or gets worse.  You have side effects from pain medicines, such as vomitingor confusion. Get help right away if:  You have severe pain.  You have trouble breathing.  You lose consciousness.  You have chest pain or pressure that lasts for  more than a few minutes. Along with the chest pain you may: ? Have pain or discomfort in one or both arms, your back, neck, jaw, or stomach. ? Have shortness of breath. ? Break out in a cold sweat. ? Feel nauseous. ? Become light-headed. These symptoms may represent a serious problem that is an emergency. Do not wait to see if the symptoms will go away. Get medical help right away. Call your local emergency services (911 in the [place] Do not drive yourself to the hospital. This information is not intended to replace advice given to you by your health care provider. Make sure you discuss any questions you have with your health care provider. Document Released: 01/03/2015 Document Revised: 01/03/2017 Document Reviewed: 01/03/2015 Elsevier Patient Education  2020 [employer].   How to Use Cold Therapy Cold therapy, or cryotherapy, is a treatment that uses cold temperatures to treat an injury or medical condition. It includes using cold packs or ice packs to reduce pain and swelling. Only use cold therapy if your doctor says it is okay. What are the risks? Generally, cold therapy is a safe treatment. However, it is not safe for:  People who are not able to say they are in pain. These include small children and people who have memory problems.  People who have certain conditions, such as: ? A problem in the vessels that slows blood flow to the fingers and toes (Raynaud's syndrome). ? Feeling very cold easily (cold hypersensitivity). ? Lack of feeling  in the area being iced. Cold therapy may not be safe for people who have other conditions. Do not use it without talking to your doctor if you have:  A heart condition.  High blood pressure.  Open or healing wounds.  An infection.  Pain and swelling in your joints (rheumatoid arthritis).  Poor blood flow in the body.  Diabetes.  Certain skin conditions. How can I make a cold pack? When using a cold pack at home to reduce pain and  swelling, you can use:  A silica gel cold pack that has been left in the freezer. You can buy this online or in stores.  A sealable plastic bag that has been filled with crushed ice.  A washcloth or paper towels soaked in cold (or ice) water.  A plastic bag of frozen vegetables. Throw them away when you are finished using them as a cold pack. Supplies needed:  A cold pack.  A towel. This can be dry or damp, based on what you like. How to use cold therapy  1. Have your cold pack ready. 2. Place a towel between the cold pack and your skin. You may also wrap the cold pack in a towel. 3. Put the cold pack on the affected area. Keep it on for no more than 20 minutes at a time. 4. Check your skin after 5 minutes to make sure that there is no damage to the area. Check for: ? White spots on your skin. Your skin may look blotchy or mottled. ? Skin that looks blue or pale. ? Skin that feels waxy or hard. 5. Repeat these steps as many times each day as told by your doctor. Always use a towel to avoid direct contact with your skin. Contact a doctor if:  You start to have white spots on your skin. This may give your skin a blotchy or mottled look.  Your skin turns blue or pale.  Your skin becomes waxy or hard.  Your swelling gets worse. Summary  Cold therapy, or cryotherapy, is used to treat an injury or other conditions. It includes using cold packs or ice packs to reduce pain and swelling.  Cold therapy is not safe for people who are not able to say they are in pain.  When using cold packs or ice packs, always place a towel between the cold source and your skin.  Check your skin after 5 minutes of icing it. This is to make sure that there is no skin damage.  Contact your doctor if you notice changes in your skin or your swelling gets worse. This information is not intended to replace advice given to you by your health care provider. Make sure you discuss any questions you have with  your health care provider. Document Released: 06/07/2007 Document Revised: 09/17/2017 Document Reviewed: 09/17/2017 Elsevier Patient Education  [place].   Cervicitis  Cervicitis is irritation and swelling of the cervix. The cervix is the lower and narrow end of the uterus. It is the part of the uterus that opens up to the vagina. What are the causes? This condition may be caused by:  An STI (sexually transmitted infection), such as gonorrhea, chlamydia, or genital herpes.  Objects that are put in the vagina, such as tampons or birth control devices. This usually occurs if an object is left in for too long.  Chemical irritation or allergic reaction. This may be from vaginal douches, latex condoms, or contraceptive creams.  An injury to the  cervix.  A bacterial infection.  Radiation therapy. What increases the risk? You are more likely to develop this condition if:  You have unprotected sex.  You have sex with many partners.  You have a new sexual partner.  You start having sex at an early age.  You have a history of STIs. What are the signs or symptoms? Symptoms of this condition include:  Pearline Cables, white, yellow, or bad-smelling vaginal discharge.  Pain or itchiness around the vagina.  Pain during sex.  Pain in the lower abdomen or lower back, especially during sex.  Urinating often.  Pain during urination.  Abnormal vaginal bleeding, such as bleeding between periods, after sex, or after menopause. In some cases, there are no symptoms. How is this diagnosed? This condition may be diagnosed with:  A pelvic exam. Your health care provider will examine whether the cervix has an unusual discharge or bleeds easily when touched with a swab.  A wet prep. This is a test in which vaginal discharge is examined under a microscope to check for signs of infection.  A swab test of the cervix. For this test, sample cells from the cervix are collected on a swab and  examined under a microscope to check for signs of infection.  Urine tests. How is this treated? Treatment for cervicitis depends on what is causing the condition. Treatment may include:  Antibiotic medicines. These are used to treat certain infections, including STIs like gonorrhea or chlamydia. If you are taking these medicines to treat an STI, your sexual partner may also need to take these medicines.  Antiviral medicines. These are used to treat herpes simplex virus. Your sexual partner may also need to take these medicines.  Stopping use of items that cause irritation, such as tampons, latex condoms, douches, or spermicides. Follow these instructions at home:  Do not have sex until your health care provider says it is okay.  Take over-the-counter and prescription medicines only as told by your health care provider.  If you were prescribed an antibiotic, take it as told by your health care provider. Do not stop taking the antibiotic even if you start to feel better.  Keep all follow-up visits as told by your health care provider. This is important. Contact a health care provider if:  Your symptoms come back or get worse after treatment.  You have a fever.  You have fatigue.  You have pain in your abdomen.  You experience nausea, vomiting, or diarrhea.  You have back pain. Get help right away if:  You have severe abdominal pain that cannot be helped with medicine.  You cannot urinate. Summary  Cervicitis is irritation and swelling of the cervix.  This condition may be caused by an STI (sexually transmitted infection), an allergic reaction or chemical irritation, radiation therapy, or objects that are put in the vagina, such as tampons or diaphragms.  Symptoms of this condition can include unusual vaginal discharge, painful urination, irritation or pain around the vagina, bleeding between periods or after sex, and pain during sex.  You are more likely to develop this  condition if you have unprotected sex, have many sexual partners, or have a history of STIs.  This condition may be treated with antibiotic or antiviral medicines or by stopping use of items that cause irritation. This information is not intended to replace advice given to you by your health care provider. Make sure you discuss any questions you have with your health care provider. Document Released: 12/19/2004 Document  Revised: 12/01/2016 Document Reviewed: 09/04/2015 Elsevier Patient Education  [employer]. ?General Gynecological Discharge Instructions .  Do not drive a car, ride a bicycle, participate in physical activities, or take public transportation until you are done taking narcotic pain medicines or as directed by your doctor.  Do not drink alcohol or take tranquilizers.  Do not take medicine that has not been prescribed by your doctor.  Do not sign important papers or make important decisions while on narcotic pain medicines.  Have a responsible person with you.  CARE OF INCISION  Keep incision clean and dry. Take showers instead of baths until your doctor gives you permission to take baths.  Avoid heavy lifting (more than 10 pounds/4.5 kilograms), pushing, or pulling.  Avoid activities that may risk injury to your surgical site.  No sexual intercourse or placement of anything in the vagina for 3 weeks or as instructed by your doctor. If you have tubes coming from the wound site, check with your doctor regarding appropriate care of the tubes. Only take prescription or over-the-counter medicines  for pain, discomfort, or fever as directed by your doctor. Do not take aspirin. It can make you bleed. Take medicines (antibiotics) that kill germs if they are prescribed for you.  Call the office or go to the Emergency Room if:  You feel sick to your stomach (nauseous).  You start to throw up (vomit).  You have trouble eating or drinking.  You have an oral temperature above 101.    You have constipation that is not helped by adjusting diet or increasing fluid intake. Pain medicines are a common cause of constipation.  You have any other concerns. SEEK IMMEDIATE MEDICAL CARE IF:  You have persistent dizziness.  You have difficulty breathing or a congested sounding (croupy) cough.  You have an oral temperature above 102.5, not controlled by medicine.  There is increasing pain or tenderness near or in the surgical site.

## 2018-11-15 ENCOUNTER — Other Ambulatory Visit: Payer: Self-pay

## 2018-11-15 ENCOUNTER — Encounter: Payer: Self-pay | Admitting: Obstetrics and Gynecology

## 2018-11-15 ENCOUNTER — Ambulatory Visit (INDEPENDENT_AMBULATORY_CARE_PROVIDER_SITE_OTHER): Payer: Medicaid Other | Admitting: Obstetrics and Gynecology

## 2018-11-15 VITALS — BP 125/87 | HR 105 | Ht 67.0 in | Wt 157.6 lb

## 2018-11-15 DIAGNOSIS — N739 Female pelvic inflammatory disease, unspecified: Secondary | ICD-10-CM

## 2018-11-15 DIAGNOSIS — Z4889 Encounter for other specified surgical aftercare: Secondary | ICD-10-CM

## 2018-11-15 DIAGNOSIS — Z90710 Acquired absence of both cervix and uterus: Secondary | ICD-10-CM

## 2018-11-15 DIAGNOSIS — D500 Iron deficiency anemia secondary to blood loss (chronic): Secondary | ICD-10-CM

## 2018-11-15 NOTE — Progress Notes (Signed)
    OBSTETRICS/GYNECOLOGY POST-OPERATIVE CLINIC VISIT  Subjective:     Amy Wolfe is a 43 y.o. [phone] female who presents to the clinic 2.5  weeks status post laparoscopic assisted vaginal hysterectomy and bilateral salpingectomy for abnormal uterine bleeding, fibroids and pelvic pain. She was readmitted 1 week post-op for pelvic abscess, requiring admission and treatment with antibiotics for 6 days, with CT guided drainage of the abscess on Day #3 of hospitalization. She is currently on atibiotic regimen of Flagyl and Bactrim. Eating a regular diet without difficulty. Bowel movements are abnormal with alternating constipation and diarrhea. Pain is controlled with current analgesics. Medications being used: acetaminophen..  Notes that her pain is much etter.   The following portions of the patient's history were reviewed and updated as appropriate: allergies, current medications, past family history, past medical history, past social history, past surgical history and problem list.  Review of Systems Pertinent items noted in HPI and remainder of comprehensive ROS otherwise negative.    Objective:    BP 125/87   Pulse (!) 105   Ht 5\' 7"  (1.702 m)   Wt 157 lb 9.6 oz (71.5 kg)   LMP 09/30/2018   BMI 24.68 kg/m  General:  alert and no distress  Abdomen: soft, bowel sounds active, non-tender  Incision:   healing well, no drainage, no erythema, no hernia, no seroma, no swelling, no dehiscence, incision well approximated     Labs:  Lab Results  Component Value Date   WBC 10.9 (H) 11/11/2018   HGB 9.6 (L) 11/11/2018   HCT 30.5 (L) 11/11/2018   MCV 67.2 (L) 11/11/2018   PLT 598 (H) 11/11/2018    Pathology:  A. 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   - INTRAVASCULAR MATERIAL CONSISTENT WITH PRIOR EMBOLIZATION.  - PROLIFERATIVE ENDOMETRIUM WITH  CHRONIC ENDOMETRITIS.  - MYOMETRIUM WITH ADENOMYOSIS AND LEIOMYOMATA, LARGEST MEASURING 5 CM,  WITH CHANGES CONSISTENT WITH PRIOR EMBOLIZATION.  - FOCAL SEROSITIS.  - BILATERAL FALLOPIAN TUBES WITH CHRONIC SALPINGITIS, FOCAL INTRALUMINAL  ACUTE SALPINGITIS, AND SEROSITIS.  - NEGATIVE FOR ATYPIA AND MALIGNANCY.   Assessment:    Postoperative course complicated by pelvic (vaginal cuff) abscess  Anemia, mild. Asymptomatic.  Plan:   1. Continue any current medications. Continue antibiotics as prescribed. Pain managed with Tylenol. Advised on decreasing use of Colace due to alternating bowel patterns. Also should begin iron supplementation for anemia. Can increase Colace of chronic constipation occurs.  2. Wound care discussed. 3. Operative findings again reviewed. Pathology report discussed. 4. Activity restrictions: no bending, stooping, or squatting, no lifting more than 10-15 pounds and no standing longer than 2 hrs without a break 5. Anticipated return to work: at reduced duties, now and with restrictions: working no more than 6 hrs per day. 6. Follow up: 3 weeks for final post-op check.     Rubie Maid, MD [facility]

## 2018-11-15 NOTE — Progress Notes (Signed)
Pt present for follow up for surgery and wound check. Pt stated that she was doing well and her incisions are healing well.

## 2018-11-18 ENCOUNTER — Other Ambulatory Visit: Payer: Self-pay

## 2018-11-18 ENCOUNTER — Ambulatory Visit
Admission: RE | Admit: 2018-11-18 | Discharge: 2018-11-18 | Disposition: A | Discharge status: Home / Self Care | Payer: Medicaid Other | Source: Ambulatory Visit | Attending: Vascular Surgery | Admitting: Vascular Surgery

## 2018-11-18 DIAGNOSIS — K862 Cyst of pancreas: Secondary | ICD-10-CM | POA: Insufficient documentation

## 2018-11-18 DIAGNOSIS — I81 Portal vein thrombosis: Secondary | ICD-10-CM

## 2018-11-18 MED ORDER — IOHEXOL 300 MG/ML  SOLN
100.0000 mL | Freq: Once | INTRAMUSCULAR | Status: AC | PRN
  Administered 2018-11-18: 100 mL via INTRAVENOUS

## 2018-11-18 NOTE — Discharge Summary (Signed)
Physician Discharge Summary  Patient ID: Amy Wolfe MRN: [id number] DOB/AGE: [birth year] 43 y.o.  Admit date: 11/04/2018 Discharge date: 11/18/2018  Admission Diagnoses: Post-operative pain, vaginal cuff cellulitis  Secondary Diagnoses:  Essential hypertension   Pancreatitis, chronic (HCC)   Portal vein thrombosis   Anticoagulated   Nausea and Vomiting    Discharge Diagnoses:    Vaginal cuff abscess   Essential hypertension   Pancreatitis, chronic (HCC)   Portal vein thrombosis   Anticoagulated    Discharged Condition: good  Hospital Course: The patient was admitted to the hospital from the Emergency Room on 11/04/2018 secondary to complaints of post-operative abdominal and pelvic pain, nausea and vomiting. She had undergone an uncomplicated LAVH with bilateral salpingectomy 1 week prior.  A CT scan was performed and noted a small fluid collection in the pelvis. She was admitted and started on IV Cefoxitin, and made NPO.  By HD#1, patient was noted to begin having febrile morbidity. Her antibiotics were changed to Zosyn. By HD#2, patient's diet was advanced to liquids and she began bridging to PO pain medications.  By HD#3, patient was beginning to feel better, however her abdomen was noted to be distended. Repeat CT scan noted development of a large pelvic abscess (10 x 12 cm in largest diameter), and underwent CT-guided drainage. Changed to on HD#5 patient's antibiotics were changed to [facility] and Flagyl after preliminary culture results of abscess fluids. By HD#6, patient's pain was well controlled, her drain had minimal output and her WBC levels were trending downward. She was deemed stable to discharge home, however patient at that time admitted to issues of temporary home displacement. Social work consultation was placed.  By HD#7, patient was discharged from the hospital.   Consults: Interventional Radiology  Significant Diagnostic Studies: labs, microbiology: blood culture:  negative, urine culture: negative and abscess culture positive for gramp positive rods and gram positive cocci, and radiology: CT scan: CT Abdomen/Pelvis and Ultrasound: Pelvis  Results for orders placed or performed during the hospital encounter of 11/04/18  SARS CORONAVIRUS 2 (TAT 6-24 HRS) Nasopharyngeal Nasopharyngeal Swab   Specimen: Nasopharyngeal Swab  Result Value Ref Range   SARS Coronavirus 2 NEGATIVE NEGATIVE  Culture, blood (routine x 2)   Specimen: BLOOD  Result Value Ref Range   Specimen Description BLOOD BLOOD RIGHT HAND    Special Requests      BOTTLES DRAWN AEROBIC AND ANAEROBIC Blood Culture adequate volume   Culture      NO GROWTH 5 DAYS Performed at [facility], [address] [place], [place] [address]    Report Status 11/10/2018 FINAL   Culture, blood (routine x 2)   Specimen: BLOOD  Result Value Ref Range   Specimen Description BLOOD BLOOD LEFT WRIST    Special Requests      BOTTLES DRAWN AEROBIC AND ANAEROBIC Blood Culture adequate volume   Culture      NO GROWTH 5 DAYS Performed at [facility], [address] [place], [place] [address]    Report Status 11/10/2018 FINAL   Culture, Urine   Specimen: Urine, Random  Result Value Ref Range   Specimen Description      URINE, RANDOM Performed at [facility], [address], [place], [place] [address]    Special Requests      NONE Performed at [facility], [address], [place], [place] [address]    Culture      NO GROWTH Performed at [facility], [place]  [address], [place], [place] [address]    Report Status 11/06/2018 FINAL   Aerobic/Anaerobic Culture (surgical/deep wound)   Specimen: Abscess  Result Value Ref Range   Specimen Description      ABSCESS Performed at [facility], [address] [place], [place] [address]    Special Requests      Normal Performed at [place], [place] [address]     Gram Stain      ABUNDANT WBC PRESENT, PREDOMINANTLY PMN MODERATE GRAM POSITIVE COCCI ABUNDANT GRAM NEGATIVE RODS FEW GRAM VARIABLE ROD    Culture      FEW BACTEROIDES ORALIS BETA LACTAMASE POSITIVE RARE ACTINOMYCES SPECIES Standardized susceptibility testing for this organism is not available. Performed at [facility], [place] [address] [place], [place] [address]    Report Status 11/11/2018 FINAL   Lipase, blood  Result Value Ref Range   Lipase 13 11 - 51 U/L  Comprehensive metabolic panel  Result Value Ref Range   Sodium 136 135 - 145 mmol/L   Potassium 3.1 (L) 3.5 - 5.1 mmol/L   Chloride 96 (L) 98 - 111 mmol/L   CO2 28 22 - 32 mmol/L   Glucose, Bld 131 (H) 70 - 99 mg/dL   BUN 12 6 - 20 mg/dL   Creatinine, Ser 0.71 0.44 - 1.00 mg/dL   Calcium 8.6 (L) 8.9 - 10.3 mg/dL   Total Protein 8.4 (H) 6.5 - 8.1 g/dL   Albumin 3.2 (L) 3.5 - 5.0 g/dL   AST 27 15 - 41 U/L   ALT 14 0 - 44 U/L   Alkaline Phosphatase 142 (H) 38 - 126 U/L   Total Bilirubin 0.7 0.3 - 1.2 mg/dL   GFR calc non Af Amer >60 >60 mL/min   GFR calc Af Amer >60 >60 mL/min   Anion gap 12 5 - 15  CBC  Result Value Ref Range   WBC 15.3 (H) 4.0 - 10.5 K/uL   RBC 5.27 (H) 3.87 - 5.11 MIL/uL   Hemoglobin 11.4 (L) 12.0 - 15.0 g/dL   HCT 36.7 36.0 - 46.0 %   MCV 69.6 (L) 80.0 - 100.0 fL   MCH 21.6 (L) 26.0 - 34.0 pg   MCHC 31.1 30.0 - 36.0 g/dL   RDW 18.4 (H) 11.5 - 15.5 %   Platelets 516 (H) 150 - 400 K/uL   nRBC 0.0 0.0 - 0.2 %  CBC WITH DIFFERENTIAL  Result Value Ref Range   WBC 13.0 (H) 4.0 - 10.5 K/uL   RBC 4.81 3.87 - 5.11 MIL/uL   Hemoglobin 10.3 (L) 12.0 - 15.0 g/dL   HCT 33.2 (L) 36.0 - 46.0 %   MCV 69.0 (L) 80.0 - 100.0 fL   MCH 21.4 (L) 26.0 - 34.0 pg   MCHC 31.0 30.0 - 36.0 g/dL   RDW 18.3 (H) 11.5 - 15.5 %   Platelets 434 (H) 150 - 400 K/uL   nRBC 0.0 0.0 - 0.2 %   Neutrophils Relative % 74 %   Neutro Abs 9.6 (H) 1.7 - 7.7 K/uL   Lymphocytes Relative 8 %   Lymphs Abs 1.0 0.7 - 4.0  K/uL   Monocytes Relative 17 %   Monocytes Absolute 2.2 (H) 0.1 - 1.0 K/uL   Eosinophils Relative 0 %   Eosinophils Absolute 0.1 0.0 - 0.5 K/uL   Basophils Relative 0 %   Basophils Absolute 0.0 0.0 - 0.1 K/uL   WBC Morphology MORPHOLOGY UNREMARKABLE  RBC Morphology MICROCYTES    Smear Review Normal platelet morphology    Immature Granulocytes 1 %   Abs Immature Granulocytes 0.09 (H) 0.00 - 0.07 K/uL  CBC with Differential/Platelet  Result Value Ref Range   WBC 14.7 (H) 4.0 - 10.5 K/uL   RBC 4.91 3.87 - 5.11 MIL/uL   Hemoglobin 10.6 (L) 12.0 - 15.0 g/dL   HCT 33.7 (L) 36.0 - 46.0 %   MCV 68.6 (L) 80.0 - 100.0 fL   MCH 21.6 (L) 26.0 - 34.0 pg   MCHC 31.5 30.0 - 36.0 g/dL   RDW 18.6 (H) 11.5 - 15.5 %   Platelets 458 (H) 150 - 400 K/uL   nRBC 0.0 0.0 - 0.2 %   Neutrophils Relative % 76 %   Neutro Abs 11.3 (H) 1.7 - 7.7 K/uL   Lymphocytes Relative 9 %   Lymphs Abs 1.3 0.7 - 4.0 K/uL   Monocytes Relative 13 %   Monocytes Absolute 1.9 (H) 0.1 - 1.0 K/uL   Eosinophils Relative 1 %   Eosinophils Absolute 0.1 0.0 - 0.5 K/uL   Basophils Relative 0 %   Basophils Absolute 0.1 0.0 - 0.1 K/uL   RBC Morphology MIX RBC POPULATION    Immature Granulocytes 1 %   Abs Immature Granulocytes 0.12 (H) 0.00 - 0.07 K/uL   Polychromasia PRESENT   Comprehensive metabolic panel  Result Value Ref Range   Sodium 137 135 - 145 mmol/L   Potassium 3.3 (L) 3.5 - 5.1 mmol/L   Chloride 99 98 - 111 mmol/L   CO2 27 22 - 32 mmol/L   Glucose, Bld 92 70 - 99 mg/dL   BUN 9 6 - 20 mg/dL   Creatinine, Ser 0.69 0.44 - 1.00 mg/dL   Calcium 8.2 (L) 8.9 - 10.3 mg/dL   Total Protein 7.0 6.5 - 8.1 g/dL   Albumin 2.5 (L) 3.5 - 5.0 g/dL   AST 16 15 - 41 U/L   ALT 12 0 - 44 U/L   Alkaline Phosphatase 131 (H) 38 - 126 U/L   Total Bilirubin 0.5 0.3 - 1.2 mg/dL   GFR calc non Af Amer >60 >60 mL/min   GFR calc Af Amer >60 >60 mL/min   Anion gap 11 5 - 15  Urinalysis, Routine w reflex microscopic  Result Value Ref  Range   Color, Urine AMBER (A) YELLOW   APPearance CLOUDY (A) CLEAR   Specific Gravity, Urine 1.040 (H) 1.005 - 1.030   pH 5.0 5.0 - 8.0   Glucose, UA NEGATIVE NEGATIVE mg/dL   Hgb urine dipstick NEGATIVE NEGATIVE   Bilirubin Urine NEGATIVE NEGATIVE   Ketones, ur 20 (A) NEGATIVE mg/dL   Protein, ur 100 (A) NEGATIVE mg/dL   Nitrite NEGATIVE NEGATIVE   Leukocytes,Ua NEGATIVE NEGATIVE   RBC / HPF 0-5 0 - 5 RBC/hpf   WBC, UA 0-5 0 - 5 WBC/hpf   Bacteria, UA NONE SEEN NONE SEEN   Squamous Epithelial / LPF 0-5 0 - 5   Mucus PRESENT   CBC with Differential/Platelet  Result Value Ref Range   WBC 12.7 (H) 4.0 - 10.5 K/uL   RBC 4.58 3.87 - 5.11 MIL/uL   Hemoglobin 9.7 (L) 12.0 - 15.0 g/dL   HCT 32.3 (L) 36.0 - 46.0 %   MCV 70.5 (L) 80.0 - 100.0 fL   MCH 21.2 (L) 26.0 - 34.0 pg   MCHC 30.0 30.0 - 36.0 g/dL   RDW 18.7 (H) 11.5 - 15.5 %  Platelets 465 (H) 150 - 400 K/uL   nRBC 0.0 0.0 - 0.2 %   Neutrophils Relative % 73 %   Neutro Abs 9.2 (H) 1.7 - 7.7 K/uL   Lymphocytes Relative 12 %   Lymphs Abs 1.5 0.7 - 4.0 K/uL   Monocytes Relative 13 %   Monocytes Absolute 1.6 (H) 0.1 - 1.0 K/uL   Eosinophils Relative 1 %   Eosinophils Absolute 0.2 0.0 - 0.5 K/uL   Basophils Relative 0 %   Basophils Absolute 0.1 0.0 - 0.1 K/uL   RBC Morphology ANISOCYTOSIS , HYPOCHROMIC RBC    Smear Review Normal platelet morphology    Immature Granulocytes 1 %   Abs Immature Granulocytes 0.13 (H) 0.00 - 0.07 K/uL  CBC with Differential/Platelet  Result Value Ref Range   WBC 12.8 (H) 4.0 - 10.5 K/uL   RBC 4.80 3.87 - 5.11 MIL/uL   Hemoglobin 10.2 (L) 12.0 - 15.0 g/dL   HCT 33.7 (L) 36.0 - 46.0 %   MCV 70.2 (L) 80.0 - 100.0 fL   MCH 21.3 (L) 26.0 - 34.0 pg   MCHC 30.3 30.0 - 36.0 g/dL   RDW 18.9 (H) 11.5 - 15.5 %   Platelets 512 (H) 150 - 400 K/uL   nRBC 0.0 0.0 - 0.2 %   Neutrophils Relative % 79 %   Neutro Abs 10.0 (H) 1.7 - 7.7 K/uL   Lymphocytes Relative 10 %   Lymphs Abs 1.3 0.7 - 4.0 K/uL    Monocytes Relative 9 %   Monocytes Absolute 1.2 (H) 0.1 - 1.0 K/uL   Eosinophils Relative 1 %   Eosinophils Absolute 0.1 0.0 - 0.5 K/uL   Basophils Relative 0 %   Basophils Absolute 0.0 0.0 - 0.1 K/uL   WBC Morphology MORPHOLOGY UNREMARKABLE    RBC Morphology MORPHOLOGY UNREMARKABLE    Smear Review Normal platelet morphology    Immature Granulocytes 1 %   Abs Immature Granulocytes 0.15 (H) 0.00 - 0.07 K/uL  CBC with Differential/Platelet  Result Value Ref Range   WBC 12.9 (H) 4.0 - 10.5 K/uL   RBC 4.37 3.87 - 5.11 MIL/uL   Hemoglobin 9.3 (L) 12.0 - 15.0 g/dL   HCT 29.8 (L) 36.0 - 46.0 %   MCV 68.2 (L) 80.0 - 100.0 fL   MCH 21.3 (L) 26.0 - 34.0 pg   MCHC 31.2 30.0 - 36.0 g/dL   RDW 19.0 (H) 11.5 - 15.5 %   Platelets 557 (H) 150 - 400 K/uL   nRBC 0.0 0.0 - 0.2 %   Neutrophils Relative % 71 %   Neutro Abs 9.2 (H) 1.7 - 7.7 K/uL   Lymphocytes Relative 13 %   Lymphs Abs 1.6 0.7 - 4.0 K/uL   Monocytes Relative 13 %   Monocytes Absolute 1.7 (H) 0.1 - 1.0 K/uL   Eosinophils Relative 1 %   Eosinophils Absolute 0.1 0.0 - 0.5 K/uL   Basophils Relative 0 %   Basophils Absolute 0.0 0.0 - 0.1 K/uL   WBC Morphology MORPHOLOGY UNREMARKABLE    RBC Morphology MIXED RBC POPULATION    Smear Review Normal platelet morphology    Immature Granulocytes 2 %   Abs Immature Granulocytes 0.19 (H) 0.00 - 0.07 K/uL   Polychromasia PRESENT    Target Cells PRESENT   Creatinine, serum  Result Value Ref Range   Creatinine, Ser 0.56 0.44 - 1.00 mg/dL   GFR calc non Af Amer >60 >60 mL/min   GFR calc Af Amer >60 >  60 mL/min  CBC with Differential/Platelet  Result Value Ref Range   WBC 11.7 (H) 4.0 - 10.5 K/uL   RBC 4.25 3.87 - 5.11 MIL/uL   Hemoglobin 9.1 (L) 12.0 - 15.0 g/dL   HCT 29.9 (L) 36.0 - 46.0 %   MCV 70.4 (L) 80.0 - 100.0 fL   MCH 21.4 (L) 26.0 - 34.0 pg   MCHC 30.4 30.0 - 36.0 g/dL   RDW 19.2 (H) 11.5 - 15.5 %   Platelets 568 (H) 150 - 400 K/uL   nRBC 0.0 0.0 - 0.2 %   Neutrophils  Relative % 73 %   Neutro Abs 8.6 (H) 1.7 - 7.7 K/uL   Lymphocytes Relative 11 %   Lymphs Abs 1.3 0.7 - 4.0 K/uL   Monocytes Relative 14 %   Monocytes Absolute 1.6 (H) 0.1 - 1.0 K/uL   Eosinophils Relative 1 %   Eosinophils Absolute 0.1 0.0 - 0.5 K/uL   Basophils Relative 0 %   Basophils Absolute 0.0 0.0 - 0.1 K/uL   WBC Morphology SMUDGE CELLS    Smear Review Normal platelet morphology    Immature Granulocytes 1 %   Abs Immature Granulocytes 0.11 (H) 0.00 - 0.07 K/uL   Polychromasia PRESENT    Target Cells PRESENT   CBC with Differential/Platelet  Result Value Ref Range   WBC 10.9 (H) 4.0 - 10.5 K/uL   RBC 4.54 3.87 - 5.11 MIL/uL   Hemoglobin 9.6 (L) 12.0 - 15.0 g/dL   HCT 30.5 (L) 36.0 - 46.0 %   MCV 67.2 (L) 80.0 - 100.0 fL   MCH 21.1 (L) 26.0 - 34.0 pg   MCHC 31.5 30.0 - 36.0 g/dL   RDW 19.3 (H) 11.5 - 15.5 %   Platelets 598 (H) 150 - 400 K/uL   nRBC 0.0 0.0 - 0.2 %   Neutrophils Relative % 76 %   Neutro Abs 8.3 (H) 1.7 - 7.7 K/uL   Lymphocytes Relative 10 %   Lymphs Abs 1.1 0.7 - 4.0 K/uL   Monocytes Relative 12 %   Monocytes Absolute 1.3 (H) 0.1 - 1.0 K/uL   Eosinophils Relative 1 %   Eosinophils Absolute 0.1 0.0 - 0.5 K/uL   Basophils Relative 0 %   Basophils Absolute 0.0 0.0 - 0.1 K/uL   WBC Morphology MORPHOLOGY UNREMARKABLE    Smear Review Normal platelet morphology    Immature Granulocytes 1 %   Abs Immature Granulocytes 0.12 (H) 0.00 - 0.07 K/uL   Polychromasia PRESENT      Imaging:  CLINICAL DATA:  Vomiting and nausea. Laparoscopic vaginal hysterectomy with bilateral salpingectomy 10/28/2018.  EXAM: CT ABDOMEN AND PELVIS WITH CONTRAST 11/04/2018  TECHNIQUE: Multidetector CT imaging of the abdomen and pelvis was performed using the standard protocol following bolus administration of intravenous contrast.  CONTRAST:  134mL OMNIPAQUE IOHEXOL 300 MG/ML  SOLN  COMPARISON:  Multiple exams, including 05/15/2018 and MRI  from 05/31/2018  FINDINGS: Lower chest: Lymph node adjacent to the distal thoracic esophagus 0.8 cm in short axis on image 10/2, significance uncertain.  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 No significant focal hepatic lesion is identified.  Pancreas: Mild indistinct prominence of the dorsal pancreatic duct. The previous cystic lesion in the pancreatic body is much less appreciable on today's exam and manifests onlay as vague hypodensity rather than the previous 2.8 cm lesion. Punctate calcification in the pancreatic head near the dorsal pancreatic duct on image 31/2.  Spleen: Unremarkable  Adrenals/Urinary Tract: Unremarkable  Stomach/Bowel: Unremarkable.  Appendix normal.  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 Collateral vessels especially along the right and left gastric regions.  Reproductive: Uterus absent. Clips along the adnexa with indistinct adnexal densities probably representing the ovaries, removal of the ovaries is somewhat ambiguous in the operative note but ovarian specimens are not listed in the pathology report. Fluid density lesion associated with the right ovary proximally 2.2 by 1.4 cm on image 33/5, surrounded by adjacent ascites.  Other: Approximately 180 cubic cm collection of fluid in the pelvis above the urinary bladder, containing a small amount of extraluminal gas and with mildly enhancing margins, as shown on image 38/5 and image 48/5. We were currently postoperative day 7. Small amount of perihepatic and perisplenic ascites also tracking in the paracolic gutters. There is fluid infiltration of the omentum and of the mesentery.  Musculoskeletal: Lumbar degenerative disc disease at L4-5 causing right  greater than left foraminal impingement.  IMPRESSION: 1. Infiltrative edema in the omentum and mesentery with about 180 cc of pelvic ascites which has a thin enhancing margin and a tiny loculation of residual extraluminal gas. Postoperative gas can persist for up to 10 days after an open surgery, extraluminal gas usually clears within a few days after laparoscopy, and the enhancement along the margins of the pelvic collection of ascites raise the possibility of exudative fluid/inflammation rather typical postoperative fluid. Given the overall appearance, infection/peritonitis cannot be readily excluded, although overt abscess formation has not occurred. 2. Sequela of prior portal vein thrombosis, with cavernous transformation of the portal vein and chronic linear filling defect in the superior mesenteric vein. 3. Small fluid density lesion associated with the residual right ovary. 4. Previous cystic lesion of the pancreatic body is much less appreciable on today's exam, appearing is only a vague hypodensity rather than the previous 2.8 cm lesion shown on 05/31/2018. 5. Lumbar impingement at L4-5.   Electronically Signed   By: Van Clines M.D.   On: 11/04/2018 18:06    CLINICAL DATA:  43 year old female status post hysterectomy 1 week ago. Patient presents with abdominal pain and distention.  EXAM: CT ABDOMEN AND PELVIS WITH CONTRAST  TECHNIQUE: Multidetector CT imaging of the abdomen and pelvis was performed using the standard protocol following bolus administration of intravenous contrast.  CONTRAST:  173mL OMNIPAQUE IOHEXOL 300 MG/ML  SOLN  COMPARISON:  CT of the abdomen pelvis dated 11/04/2018  FINDINGS: Lower chest: Partially visualized small bilateral pleural effusions, new since the prior CT. There is associated partial compressive atelectasis of the lower lobes versus pneumonia.  No intra-abdominal free air. There is a small ascites,  increased since the prior CT.  Hepatobiliary: The liver is unremarkable. There is mild periportal edema. No calcified gallstone.  Pancreas: Small coarse calcification of the uncinate process of the pancreas. There is an ill-defined area of hypodensity involving the neck of the pancreas (coronal series 5, image 43). This likely corresponds to the cystic changes seen in this region on the MRI of 05/31/2018. There is mild atrophy of the body and tail of the pancreas with mild dilatation of the main pancreatic duct measuring up to 6 mm.  Spleen: Normal in size without focal abnormality.  Adrenals/Urinary Tract: The adrenal glands are unremarkable. There is no hydronephrosis on either side. There is symmetric enhancement and excretion of contrast by  both kidneys. The visualized ureters and urinary bladder appear unremarkable.  Stomach/Bowel: There is no bowel obstruction. Thickened appearance of the sigmoid colon, likely reactive to inflammatory/infectious process in the abdomen or pelvis. The appendix is unremarkable.  Vascular/Lymphatic: The abdominal aorta and IVC are unremarkable. 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 No portal venous gas. Several top-normal retroperitoneal lymph nodes, likely reactive.  Reproductive: Hysterectomy.  Other: There is a complex fluid collection containing pockets of air within the pelvis measuring approximately 10 x 12 cm in greatest axial dimensions and 6.5 cm in craniocaudal length. There is enhancement of the walls of this collection. This is most consistent with developing abscess. There is diffuse edema throughout the mesentery and omentum. Smaller loculated appearing collection in the midline anterior lower abdomen/pelvis (series 2, image 67) measures approximately 4.2 x 1.4 cm.  Musculoskeletal: No acute or  significant osseous findings.  IMPRESSION: 1. Complex fluid collection containing pockets of air within the pelvis most consistent with an infected collection/abscess. There is diffuse edema of the mesentery and omentum. Overall increase the size of the ascites since the prior CT. Clinical correlation is recommended to evaluate for peritonitis. 2. Small bilateral pleural effusions with associated partial compressive atelectasis of the lower lobes versus pneumonia. This finding is new since the prior CT. 3. Ill-defined hypodensity in the neck of the pancreas likely corresponds to the cystic changes seen on the MRI of 05/31/2018. There is mild atrophy of the body and tail of the pancreas with mild dilatation of the main pancreatic duct. 4. Thickened appearance of the sigmoid colon, likely reactive to inflammatory/infectious process in the abdomen or pelvis. No bowel obstruction. Normal appendix.   Electronically Signed   By: Anner Crete M.D.   On: 11/07/2018 09:33   Ultrasound 11/09/2018 CLINICAL DATA:  Status post drainage of a pelvic abscess 2 days ago.  EXAM: LIMITED ULTRASOUND OF PELVIS  TECHNIQUE: Limited transabdominal ultrasound examination of the pelvis was performed.  COMPARISON:  Pelvic ultrasound 09/04/2018, CT abdomen pelvis 11/07/2018  FINDINGS: Adjacent to the drainage catheter in the midline pelvis there is a mildly complex collection measuring 5.4 x 4.3 by 4.7 cm without internal blood flow, likely representing residual fluid collection. This previously measured up to 12 cm.  IMPRESSION: Adjacent to the drainage catheter in the midline pelvis there is a complex collection measuring up to 5.4 cm likely representing residual abscess.   Electronically Signed   By: Audie Pinto M.D.   On: 11/09/2018 16:04      Treatments: IV hydration, antibiotics: Cefoxitin, then Zosyn, then Bactrim and metronidazole; analgesia: acetaminophen,  Dilaudid and Tramadol; and procedures: CT-guided abscess drainage   Discharge Exam: Blood pressure 131/88, pulse 94, temperature 99 F (37.2 C), temperature source Oral, resp. rate 18, height 5\' 7"  (1.702 m), weight 75.3 kg, last menstrual period 09/30/2018, SpO2 98 %. General: alert and no distress  Lungs: clear to auscultation bilaterally Heart: regular rate and rhythm, S1, S2 normal, no murmur, click, rub or gallop Abdomen: soft.  Mildly tender in lower abdomen.  Drain in place, site clean and dry. Pelvis:None  Incision: healing well Extremities: DVT Evaluation: No evidence of DVT seen on physical exam. Negative Homan's sign. No cords or calf tenderness. No significant calf/ankle edema.    Disposition: Discharge disposition: 01-Home or Self Care   Discharge Instructions    Discharge patient   Complete by: As directed  Discharge disposition: 01-Home or Self Care   Discharge patient date: 11/10/2018     Allergies as of 11/11/2018   No Known Allergies     Medication List    STOP taking these medications   HYDROcodone-acetaminophen 5-325 MG tablet Commonly known as: Norco     TAKE these medications   acetaminophen 650 MG CR tablet Commonly known as: Tylenol 8 Hour Take 1 tablet (650 mg total) by mouth every 8 (eight) hours as needed for pain.   amLODipine 10 MG tablet Commonly known as: NORVASC Take 1 tablet (10 mg total) by mouth daily. What changed: when to take this   docusate sodium 100 MG capsule Commonly known as: COLACE Take 1 capsule (100 mg total) by mouth 2 (two) times daily as needed. What changed: when to take this   ferrous sulfate 325 (65 FE) MG tablet Commonly known as: FerrouSul Take 1 tablet (325 mg total) by mouth daily with breakfast.   HYDROmorphone 4 MG tablet Commonly known as: DILAUDID Take 1 tablet (4 mg total) by mouth every 6 (six) hours as needed for moderate pain or severe pain.   lipase/protease/amylase 36000 UNITS Cpep  capsule Commonly known as: CREON Take 2 capsules with each meal and 1 capsule with a snack What changed:   how much to take  how to take this  when to take this  additional instructions   metroNIDAZOLE 500 MG tablet Commonly known as: FLAGYL Take 1 tablet (500 mg total) by mouth 2 (two) times daily.   pantoprazole 40 MG tablet Commonly known as: PROTONIX Take 1 tablet (40 mg total) by mouth daily. What changed: when to take this   promethazine 25 MG tablet Commonly known as: PHENERGAN Take 1 tablet (25 mg total) by mouth every 6 (six) hours as needed for nausea or vomiting.   rivaroxaban 20 MG Tabs tablet Commonly known as: XARELTO Take 1 tablet (20 mg total) by mouth daily with supper.   sulfamethoxazole-trimethoprim 800-160 MG tablet Commonly known as: BACTRIM DS Take 1 tablet by mouth 2 (two) times daily.      Follow-up Information    Rubie Maid, MD. Go to.   Specialties: Obstetrics and Gynecology, Radiology Why: Please got to follow up appointment on Friday at 4:00pm  Contact information: [place] [place] [address] [phone]           Signed: Rubie Maid, MD 11/18/2018, 7:30 PM

## 2018-11-22 ENCOUNTER — Ambulatory Visit (INDEPENDENT_AMBULATORY_CARE_PROVIDER_SITE_OTHER): Payer: Medicaid Other | Admitting: Nurse Practitioner

## 2018-11-22 ENCOUNTER — Encounter (INDEPENDENT_AMBULATORY_CARE_PROVIDER_SITE_OTHER): Payer: Self-pay | Admitting: Nurse Practitioner

## 2018-11-22 ENCOUNTER — Other Ambulatory Visit: Payer: Self-pay

## 2018-11-22 VITALS — BP 129/86 | HR 97 | Resp 16 | Wt 157.0 lb

## 2018-11-22 DIAGNOSIS — I81 Portal vein thrombosis: Secondary | ICD-10-CM

## 2018-11-22 DIAGNOSIS — I10 Essential (primary) hypertension: Secondary | ICD-10-CM | POA: Diagnosis not present

## 2018-11-24 ENCOUNTER — Encounter (INDEPENDENT_AMBULATORY_CARE_PROVIDER_SITE_OTHER): Payer: Self-pay | Admitting: Nurse Practitioner

## 2018-11-24 NOTE — Progress Notes (Signed)
SUBJECTIVE:  Patient ID: Amy Wolfe, female    DOB: 04/06/1975, 43 y.o.   MRN: [id number] Chief Complaint  Patient presents with  . Follow-up    60month follow up    HPI  Amy Wolfe is a 43 y.o. female that presents for evaluation for portal venous thrombosis.  Previously when the patient was seen she had recently had a flare of her pancreatitis and was suffering from severe abdominal pain as well as swelling.  At this time the patient continues to have abdominal pain however it is not severe and it is not consistent.  She reports being consistent with all of her medicines for her pancreatitis as well as continuing on Xarelto.  She denies any fever, chills, nausea, vomiting or diarrhea.  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  The most recent CT scan revealed a stable appearance of the portal venous occlusion with multiple collaterals.  Previously occlusive thrombus in the superior mesenteric vein appears to be nonocclusive at this time as well as stable.  There is no progression of the venous thrombosis listed previously.  There are changes consistent with chronic pancreatitis however her pancreas appears more stable compared to the previous study.  Past Medical History:  Diagnosis Date  . Abdominal pain   . Anxiety   . Coronary artery disease   . Dyspnea   . GERD (gastroesophageal reflux disease)   . H/O blood clots   . Hypertension   . Liver abscess   . Pancreatitis   . PONV (postoperative nausea and vomiting) 11/07/2018  . Thyroid disease   . Uterine fibroid     Past Surgical History:  Procedure Laterality Date  . CESAREAN SECTION    . EMBOLIZATION N/A 06/14/2018   Procedure: Uterine Embolization;  Surgeon: Algernon Huxley, MD;  Location: [facility];  Service: Cardiovascular;  Laterality: N/A;  . laceration finger Right   . LAPAROSCOPIC VAGINAL HYSTERECTOMY WITH SALPINGECTOMY  Bilateral 10/28/2018   Procedure: LAPAROSCOPIC ASSISTED VAGINAL HYSTERECTOMY BILATERAL WITH SALPINGECTOMY;  Surgeon: Rubie Maid, MD;  Location: [facility];  Service: Gynecology;  Laterality: Bilateral;  . THYROIDECTOMY, PARTIAL    . VAGINAL HYSTERECTOMY Bilateral 10/28/2018   Procedure: HYSTERECTOMY VAGINAL WITH BILATERAL SALPINGECTOMY;  Surgeon: Rubie Maid, MD;  Location: [facility];  Service: Gynecology;  Laterality: Bilateral;    Social History   Socioeconomic History  . Marital status: Single    Spouse name: Not on file  . Number of children: 2  . Years of education: 54  . Highest education level: Associate degree: academic program  Occupational History  . Occupation: [employer]  Social Needs  . Financial resource strain: Not hard at all  . Food insecurity    Worry: Never true    Inability: Never true  . Transportation needs    Medical: No    Non-medical: No  Tobacco Use  . Smoking status: Current Every Day Smoker    Packs/day: 0.50    Years: 35.00    Pack years: 17.50    Types: Cigarettes  . Smokeless tobacco: Never Used  Substance and Sexual Activity  . Alcohol use: Not Currently  . Drug use: Yes    Types: Marijuana    Comment: daily  . Sexual activity: Not Currently  Lifestyle  . Physical activity    Days per week: 0 days    Minutes per session: 0 min  . Stress: Only a little  Relationships  . Social connections    Talks on phone: More than three times a week    Gets together: More than three times a week    Attends religious service: Never    Active member of club or organization: No    Attends meetings of clubs or organizations: Never    Relationship status: Not on file  . Intimate partner violence    Fear of current or ex partner: No    Emotionally abused: No    Physically abused: No    Forced sexual activity: No  Other Topics Concern  . Not on file  Social History Narrative  . Not on file    Family History  Problem Relation Age of Onset  .  Hypertension Mother   . Diabetes Mother   . Cancer Father   . Cancer Paternal Grandmother   . Cancer Paternal Grandfather   . Aneurysm Brother     No Known Allergies   Review of Systems   Review of Systems: Negative Unless Checked Constitutional: [] Weight loss  [] Fever  [] Chills Cardiac: [] Chest pain   []  Atrial Fibrillation  [] Palpitations   [] Shortness of breath when laying flat   [] Shortness of breath with exertion. [] Shortness of breath at rest Vascular:  [] Pain in legs with walking   [] Pain in legs with standing [] Pain in legs when laying flat   [] Claudication    [] Pain in feet when laying flat    [] History of DVT   [] Phlebitis   [x] Swelling in legs   [] Varicose veins   [] Non-healing ulcers Pulmonary:   [] Uses home oxygen   [] Productive cough   [] Hemoptysis   [] Wheeze  [] COPD   [] Asthma Neurologic:  [] Dizziness   [] Seizures  [] Blackouts [] History of stroke   [] History of TIA  [] Aphasia   [] Temporary Blindness   [] Weakness or numbness in arm   [] Weakness or numbness in leg Musculoskeletal:   [] Joint swelling   [] Joint pain   [] Low back pain  []  History of Knee Replacement [] Arthritis [] back Surgeries  []  Spinal Stenosis    Hematologic:  [] Easy bruising  [] Easy bleeding   [] Hypercoagulable state   [] Anemic Gastrointestinal:  [] Diarrhea   [] Vomiting  [] Gastroesophageal reflux/heartburn   [] Difficulty swallowing. [x] Abdominal pain Genitourinary:  [] Chronic kidney disease   [] Difficult urination  [] Anuric   [] Blood in urine [] Frequent urination  [] Burning with urination   [] Hematuria Skin:  [] Rashes   [] Ulcers [] Wounds Psychological:  [] History of anxiety   []  History of major depression  []  Memory Difficulties      OBJECTIVE:   Physical Exam  BP 129/86 (BP Location: Right Arm)   Pulse 97   Resp 16   Wt 157 lb (71.2 kg)   LMP 09/30/2018   BMI 24.59 kg/m   Gen: WD/WN, NAD Head: [place]/AT, No temporalis wasting.  Ear/Nose/Throat: Hearing grossly intact, nares w/o erythema or  drainage Eyes: PER, EOMI, sclera nonicteric.  Neck: Supple, no masses.  No JVD.  Pulmonary:  Good air movement, no use of accessory muscles.  Cardiac: RRR Vascular:  Vessel Right Left  Radial Palpable Palpable   Gastrointestinal: soft, non-distended. No guarding/no peritoneal signs.  Musculoskeletal: M/S 5/5 throughout.  No deformity or atrophy.  Neurologic: Pain and light touch intact in extremities.  Symmetrical.  Speech is fluent. Motor exam as listed above. Psychiatric: Judgment intact, Mood & affect appropriate for pt's clinical situation. Dermatologic: No Venous rashes. No Ulcers Noted.  No changes consistent with cellulitis. Lymph : No Cervical lymphadenopathy, no  lichenification or skin changes of chronic lymphedema.       ASSESSMENT AND PLAN:  1. Portal vein thrombosis Overall the portal vein thrombus does appear to be stable.  There does not appear to be any progression.  The patient denies having any issues with Xarelto so therefore at this time she will remain on Xarelto.  We will also have her follow-up with a CT scan in 1 year to reevaluate portal vein thrombus changes.  2. Essential hypertension Patient now has PCP.  Hypertension under good control.  On appropriate medications.  We will continue to be followed by her PCP   Current Outpatient Medications on File Prior to Visit  Medication Sig Dispense Refill  . acetaminophen (TYLENOL 8 HOUR) 650 MG CR tablet Take 1 tablet (650 mg total) by mouth every 8 (eight) hours as needed for pain. 30 tablet 0  . docusate sodium (COLACE) 100 MG capsule Take 1 capsule (100 mg total) by mouth 2 (two) times daily as needed. (Patient taking differently: Take 100 mg by mouth daily. ) 30 capsule 2  . HYDROmorphone (DILAUDID) 4 MG tablet Take 1 tablet (4 mg total) by mouth every 6 (six) hours as needed for moderate pain or severe pain. 30 tablet 0  . lipase/protease/amylase (CREON) 36000 UNITS CPEP capsule Take 2 capsules with each meal  and 1 capsule with a snack (Patient taking differently: Take 36,000-72,000 Units by mouth See admin instructions. Take 2 capsules (72000 units) with each meal & 1 capsule (36000 units) with each snack) 210 capsule 3  . metroNIDAZOLE (FLAGYL) 500 MG tablet Take 1 tablet (500 mg total) by mouth 2 (two) times daily. 12 tablet 0  . pantoprazole (PROTONIX) 40 MG tablet Take 1 tablet (40 mg total) by mouth daily. (Patient taking differently: Take 40 mg by mouth at bedtime. ) 30 tablet 6  . promethazine (PHENERGAN) 25 MG tablet Take 1 tablet (25 mg total) by mouth every 6 (six) hours as needed for nausea or vomiting. 30 tablet 0  . rivaroxaban (XARELTO) 20 MG TABS tablet Take 1 tablet (20 mg total) by mouth daily with supper. 30 tablet 0  . sulfamethoxazole-trimethoprim (BACTRIM DS) 800-160 MG tablet Take 1 tablet by mouth 2 (two) times daily. 12 tablet 0  . amLODipine (NORVASC) 10 MG tablet Take 1 tablet (10 mg total) by mouth daily. (Patient taking differently: Take 10 mg by mouth at bedtime. ) 90 tablet 0  . ferrous sulfate (FERROUSUL) 325 (65 FE) MG tablet Take 1 tablet (325 mg total) by mouth daily with breakfast. (Patient not taking: Reported on 11/15/2018) 30 tablet 1   No current facility-administered medications on file prior to visit.     There are no Patient Instructions on file for this visit. No follow-ups on file.   Kris Hartmann, NP  This note was completed with Sales executive.  Any errors are purely unintentional.

## 2018-11-26 ENCOUNTER — Telehealth: Payer: Self-pay | Admitting: Obstetrics and Gynecology

## 2018-11-26 NOTE — Telephone Encounter (Signed)
Pt called in considered about abnormal bleeding after intercourse. Pt stated she is in post op of a hysterectomy. Pt is requesting call back. Please advise

## 2018-11-30 ENCOUNTER — Other Ambulatory Visit: Payer: Self-pay | Admitting: Gastroenterology

## 2018-12-03 NOTE — Telephone Encounter (Signed)
Was called to check on her. Pt stated that she is doing okay now.

## 2018-12-05 ENCOUNTER — Encounter: Payer: Medicaid Other | Admitting: Obstetrics and Gynecology

## 2018-12-06 ENCOUNTER — Encounter: Payer: Medicaid Other | Admitting: Obstetrics and Gynecology

## 2019-01-09 ENCOUNTER — Other Ambulatory Visit: Payer: Self-pay

## 2019-01-09 MED ORDER — PANTOPRAZOLE SODIUM 40 MG PO TBEC: 40.0000 mg | DELAYED_RELEASE_TABLET | Freq: Every day | ORAL | 0 refills | Status: DC

## 2019-02-06 ENCOUNTER — Other Ambulatory Visit (INDEPENDENT_AMBULATORY_CARE_PROVIDER_SITE_OTHER): Payer: Self-pay | Admitting: Nurse Practitioner

## 2019-02-06 MED ORDER — RIVAROXABAN 20 MG PO TABS: 20.0000 mg | ORAL_TABLET | Freq: Every day | ORAL | 5 refills | Status: DC

## 2019-03-30 ENCOUNTER — Observation Stay
Admission: EM | Admit: 2019-03-30 | Discharge: 2019-03-31 | Discharge status: Against Medical Advice | Payer: Medicaid Other | Attending: Internal Medicine | Admitting: Internal Medicine

## 2019-03-30 ENCOUNTER — Other Ambulatory Visit: Payer: Self-pay

## 2019-03-30 ENCOUNTER — Emergency Department: Payer: Medicaid Other

## 2019-03-30 DIAGNOSIS — F17210 Nicotine dependence, cigarettes, uncomplicated: Secondary | ICD-10-CM | POA: Diagnosis not present

## 2019-03-30 DIAGNOSIS — Z90722 Acquired absence of ovaries, bilateral: Secondary | ICD-10-CM | POA: Diagnosis not present

## 2019-03-30 DIAGNOSIS — Z720 Tobacco use: Secondary | ICD-10-CM | POA: Diagnosis present

## 2019-03-30 DIAGNOSIS — I2510 Atherosclerotic heart disease of native coronary artery without angina pectoris: Secondary | ICD-10-CM | POA: Insufficient documentation

## 2019-03-30 DIAGNOSIS — F1210 Cannabis abuse, uncomplicated: Secondary | ICD-10-CM | POA: Diagnosis not present

## 2019-03-30 DIAGNOSIS — R188 Other ascites: Secondary | ICD-10-CM | POA: Diagnosis not present

## 2019-03-30 DIAGNOSIS — Z90710 Acquired absence of both cervix and uterus: Secondary | ICD-10-CM | POA: Insufficient documentation

## 2019-03-30 DIAGNOSIS — Z7901 Long term (current) use of anticoagulants: Secondary | ICD-10-CM | POA: Diagnosis not present

## 2019-03-30 DIAGNOSIS — Z20822 Contact with and (suspected) exposure to covid-19: Secondary | ICD-10-CM | POA: Diagnosis not present

## 2019-03-30 DIAGNOSIS — R112 Nausea with vomiting, unspecified: Secondary | ICD-10-CM | POA: Diagnosis not present

## 2019-03-30 DIAGNOSIS — I81 Portal vein thrombosis: Secondary | ICD-10-CM | POA: Diagnosis not present

## 2019-03-30 DIAGNOSIS — R918 Other nonspecific abnormal finding of lung field: Secondary | ICD-10-CM | POA: Diagnosis not present

## 2019-03-30 DIAGNOSIS — Z79899 Other long term (current) drug therapy: Secondary | ICD-10-CM | POA: Insufficient documentation

## 2019-03-30 DIAGNOSIS — I10 Essential (primary) hypertension: Secondary | ICD-10-CM | POA: Diagnosis present

## 2019-03-30 DIAGNOSIS — K219 Gastro-esophageal reflux disease without esophagitis: Secondary | ICD-10-CM | POA: Diagnosis not present

## 2019-03-30 DIAGNOSIS — R109 Unspecified abdominal pain: Secondary | ICD-10-CM | POA: Diagnosis present

## 2019-03-30 DIAGNOSIS — K861 Other chronic pancreatitis: Secondary | ICD-10-CM | POA: Diagnosis present

## 2019-03-30 DIAGNOSIS — R55 Syncope and collapse: Principal | ICD-10-CM | POA: Diagnosis present

## 2019-03-30 DIAGNOSIS — Z9119 Patient's noncompliance with other medical treatment and regimen: Secondary | ICD-10-CM | POA: Insufficient documentation

## 2019-03-30 DIAGNOSIS — E890 Postprocedural hypothyroidism: Secondary | ICD-10-CM | POA: Diagnosis not present

## 2019-03-30 DIAGNOSIS — Z5321 Procedure and treatment not carried out due to patient leaving prior to being seen by health care provider: Secondary | ICD-10-CM | POA: Diagnosis not present

## 2019-03-30 DIAGNOSIS — Z8249 Family history of ischemic heart disease and other diseases of the circulatory system: Secondary | ICD-10-CM | POA: Insufficient documentation

## 2019-03-30 DIAGNOSIS — Z8719 Personal history of other diseases of the digestive system: Secondary | ICD-10-CM | POA: Diagnosis not present

## 2019-03-30 LAB — CBC WITH DIFFERENTIAL/PLATELET
Abs Immature Granulocytes: 0.02 10*3/uL (ref 0.00–0.07)
Basophils Absolute: 0 10*3/uL (ref 0.0–0.1)
Basophils Relative: 0 %
Eosinophils Absolute: 0.1 10*3/uL (ref 0.0–0.5)
Eosinophils Relative: 1 %
HCT: 48.2 % — ABNORMAL HIGH (ref 36.0–46.0)
Hemoglobin: 15 g/dL (ref 12.0–15.0)
Immature Granulocytes: 0 %
Lymphocytes Relative: 22 %
Lymphs Abs: 1.5 10*3/uL (ref 0.7–4.0)
MCH: 23.2 pg — ABNORMAL LOW (ref 26.0–34.0)
MCHC: 31.1 g/dL (ref 30.0–36.0)
MCV: 74.5 fL — ABNORMAL LOW (ref 80.0–100.0)
Monocytes Absolute: 0.7 10*3/uL (ref 0.1–1.0)
Monocytes Relative: 9 %
Neutro Abs: 4.8 10*3/uL (ref 1.7–7.7)
Neutrophils Relative %: 68 %
Platelets: 368 10*3/uL (ref 150–400)
RBC: 6.47 MIL/uL — ABNORMAL HIGH (ref 3.87–5.11)
RDW: 20.1 % — ABNORMAL HIGH (ref 11.5–15.5)
WBC: 7.1 10*3/uL (ref 4.0–10.5)
nRBC: 0 % (ref 0.0–0.2)

## 2019-03-30 LAB — COMPREHENSIVE METABOLIC PANEL
ALT: 18 U/L (ref 0–44)
AST: 25 U/L (ref 15–41)
Albumin: 4.1 g/dL (ref 3.5–5.0)
Alkaline Phosphatase: 76 U/L (ref 38–126)
Anion gap: 15 (ref 5–15)
BUN: 16 mg/dL (ref 6–20)
CO2: 26 mmol/L (ref 22–32)
Calcium: 9.7 mg/dL (ref 8.9–10.3)
Chloride: 95 mmol/L — ABNORMAL LOW (ref 98–111)
Creatinine, Ser: 0.87 mg/dL (ref 0.44–1.00)
GFR calc Af Amer: >60 mL/min (ref >60)
GFR calc non Af Amer: >60 mL/min (ref >60)
Glucose, Bld: 136 mg/dL — ABNORMAL HIGH (ref 70–99)
Potassium: 4.2 mmol/L (ref 3.5–5.1)
Sodium: 136 mmol/L (ref 135–145)
Total Bilirubin: 1 mg/dL (ref 0.3–1.2)
Total Protein: 8.6 g/dL — ABNORMAL HIGH (ref 6.5–8.1)

## 2019-03-30 LAB — HCG, QUANTITATIVE, PREGNANCY: hCG, Beta Chain, Quant, S: <1 m[IU]/mL (ref <5)

## 2019-03-30 LAB — RESPIRATORY PANEL BY RT PCR (FLU A&B, COVID)
Influenza A by PCR: NEGATIVE
Influenza B by PCR: NEGATIVE
SARS Coronavirus 2 by RT PCR: NEGATIVE

## 2019-03-30 LAB — TROPONIN I (HIGH SENSITIVITY)
Troponin I (High Sensitivity): 5 ng/L (ref <18)
Troponin I (High Sensitivity): 6 ng/L (ref <18)

## 2019-03-30 LAB — CK: Total CK: 43 U/L (ref 38–234)

## 2019-03-30 LAB — ETHANOL: Alcohol, Ethyl (B): <10 mg/dL (ref <10)

## 2019-03-30 LAB — LIPASE, BLOOD: Lipase: 22 U/L (ref 11–51)

## 2019-03-30 MED ORDER — ENOXAPARIN SODIUM 40 MG/0.4ML ~~LOC~~ SOLN: 40.0000 mg | SUBCUTANEOUS | Status: DC

## 2019-03-30 MED ORDER — SODIUM CHLORIDE 0.9 % IV BOLUS
1000.0000 mL | Freq: Once | INTRAVENOUS | Status: AC
  Administered 2019-03-30: 12:00:00 1000 mL via INTRAVENOUS

## 2019-03-30 MED ORDER — HYDROMORPHONE HCL 1 MG/ML IJ SOLN
0.5000 mg | Freq: Once | INTRAMUSCULAR | Status: AC
  Administered 2019-03-30: 10:00:00 0.5 mg via INTRAVENOUS
  Filled 2019-03-30: qty 1

## 2019-03-30 MED ORDER — IOHEXOL 350 MG/ML SOLN
75.0000 mL | Freq: Once | INTRAVENOUS | Status: AC | PRN
  Administered 2019-03-30: 11:00:00 75 mL via INTRAVENOUS

## 2019-03-30 MED ORDER — ONDANSETRON HCL 4 MG/2ML IJ SOLN: 4.0000 mg | Freq: Four times a day (QID) | INTRAMUSCULAR | Status: DC

## 2019-03-30 MED ORDER — HYDRALAZINE HCL 25 MG PO TABS
25.0000 mg | ORAL_TABLET | Freq: Three times a day (TID) | ORAL | Status: DC | PRN
  Filled 2019-03-30: qty 1

## 2019-03-30 MED ORDER — PANCRELIPASE (LIP-PROT-AMYL) 12000-38000 UNITS PO CPEP: 36000.0000 [IU] | ORAL_CAPSULE | ORAL | Status: DC

## 2019-03-30 MED ORDER — DM-GUAIFENESIN ER 30-600 MG PO TB12
1.0000 | ORAL_TABLET | Freq: Two times a day (BID) | ORAL | Status: DC
  Filled 2019-03-30 (×3): qty 1

## 2019-03-30 MED ORDER — DOCUSATE SODIUM 100 MG PO CAPS: 100.0000 mg | ORAL_CAPSULE | Freq: Two times a day (BID) | ORAL | Status: DC | PRN

## 2019-03-30 MED ORDER — ALBUTEROL SULFATE HFA 108 (90 BASE) MCG/ACT IN AERS: 2.0000 | INHALATION_SPRAY | RESPIRATORY_TRACT | Status: DC | PRN

## 2019-03-30 MED ORDER — HYDROMORPHONE HCL 1 MG/ML IJ SOLN: 1.0000 mg | Freq: Once | INTRAMUSCULAR | Status: DC

## 2019-03-30 MED ORDER — PROMETHAZINE HCL 25 MG/ML IJ SOLN
25.0000 mg | Freq: Four times a day (QID) | INTRAMUSCULAR | Status: DC
  Administered 2019-03-30 – 2019-03-31 (×2): 25 mg via INTRAVENOUS
  Filled 2019-03-30 (×2): qty 1

## 2019-03-30 MED ORDER — ONDANSETRON HCL 4 MG/2ML IJ SOLN
4.0000 mg | Freq: Three times a day (TID) | INTRAMUSCULAR | Status: DC | PRN
  Administered 2019-03-30: 19:00:00 4 mg via INTRAVENOUS
  Filled 2019-03-30: qty 2

## 2019-03-30 MED ORDER — PANCRELIPASE (LIP-PROT-AMYL) 12000-38000 UNITS PO CPEP
72000.0000 [IU] | ORAL_CAPSULE | Freq: Three times a day (TID) | ORAL | Status: DC
  Filled 2019-03-30 (×4): qty 6

## 2019-03-30 MED ORDER — ACETAMINOPHEN 325 MG PO TABS
650.0000 mg | ORAL_TABLET | Freq: Four times a day (QID) | ORAL | Status: DC | PRN
  Administered 2019-03-30: 650 mg via ORAL
  Filled 2019-03-30: qty 2

## 2019-03-30 MED ORDER — PANCRELIPASE (LIP-PROT-AMYL) 12000-38000 UNITS PO CPEP
36000.0000 [IU] | ORAL_CAPSULE | Freq: Two times a day (BID) | ORAL | Status: DC
  Filled 2019-03-30 (×2): qty 3

## 2019-03-30 MED ORDER — ALBUTEROL SULFATE (2.5 MG/3ML) 0.083% IN NEBU: 2.5000 mg | INHALATION_SOLUTION | RESPIRATORY_TRACT | Status: DC | PRN

## 2019-03-30 MED ORDER — ENSURE ENLIVE PO LIQD: 237.0000 mL | Freq: Two times a day (BID) | ORAL | Status: DC

## 2019-03-30 MED ORDER — NICOTINE 21 MG/24HR TD PT24
21.0000 mg | MEDICATED_PATCH | Freq: Every day | TRANSDERMAL | Status: DC
  Filled 2019-03-30: qty 1

## 2019-03-30 MED ORDER — ONDANSETRON HCL 4 MG/2ML IJ SOLN
4.0000 mg | Freq: Four times a day (QID) | INTRAMUSCULAR | Status: DC
  Administered 2019-03-31 (×2): 4 mg via INTRAVENOUS
  Filled 2019-03-30 (×2): qty 2

## 2019-03-30 MED ORDER — ONDANSETRON HCL 4 MG/2ML IJ SOLN
4.0000 mg | Freq: Once | INTRAMUSCULAR | Status: AC
  Administered 2019-03-30: 10:00:00 4 mg via INTRAVENOUS
  Filled 2019-03-30: qty 2

## 2019-03-30 MED ORDER — SODIUM CHLORIDE 0.9 % IV SOLN: INTRAVENOUS | Status: DC

## 2019-03-30 MED ORDER — RIVAROXABAN 20 MG PO TABS
20.0000 mg | ORAL_TABLET | Freq: Every day | ORAL | Status: DC
  Administered 2019-03-30: 20 mg via ORAL
  Filled 2019-03-30 (×2): qty 1

## 2019-03-30 MED ORDER — HYDROMORPHONE HCL 1 MG/ML IJ SOLN
0.5000 mg | Freq: Once | INTRAMUSCULAR | Status: AC
  Administered 2019-03-30: 0.5 mg via INTRAVENOUS
  Filled 2019-03-30: qty 1

## 2019-03-30 MED ORDER — PANTOPRAZOLE SODIUM 40 MG IV SOLR
40.0000 mg | INTRAVENOUS | Status: DC
  Administered 2019-03-30: 23:00:00 40 mg via INTRAVENOUS
  Filled 2019-03-30 (×2): qty 40

## 2019-03-30 MED ORDER — HYDROMORPHONE HCL 1 MG/ML IJ SOLN
1.0000 mg | INTRAMUSCULAR | Status: DC | PRN
  Administered 2019-03-30 – 2019-03-31 (×3): 1 mg via INTRAVENOUS
  Filled 2019-03-30 (×3): qty 1

## 2019-03-30 MED ORDER — PANTOPRAZOLE SODIUM 40 MG PO TBEC
40.0000 mg | DELAYED_RELEASE_TABLET | Freq: Every day | ORAL | Status: DC
  Administered 2019-03-30: 40 mg via ORAL
  Filled 2019-03-30: qty 1

## 2019-03-30 NOTE — Discharge Instructions (Signed)
IMPRESSION:  1. No evidence for acute pulmonary embolus.  2. Findings compatible with chronic calcific pancreatitis.  3. Cavernous transformation of the portal vein.  4. Nonspecific moderate volume free fluid in the pelvis.  5. Pulmonary nodules measuring up to 5 mm. No follow-up needed if  patient is low-risk (and has no known or suspected primary  neoplasm). Non-contrast chest CT can be considered in 12 months if  patient is high-risk. This recommendation follows the consensus  statement: Guidelines for Management of Incidental Pulmonary Nodules  Detected on CT Images: From the Fleischner Society 2017; Radiology  2017; 284:228-243.

## 2019-03-30 NOTE — ED Notes (Addendum)
Pt provided water, ok per admitting MD. Roxine Caddy, RN student provided pt with water. Pt asked if she could go outside. Informed pt she could not and had to stay in room.

## 2019-03-30 NOTE — Progress Notes (Signed)
Dr. Marcelline Mates in to see Pt. After Consult Dr. Marcelline Mates stated Pt. Had thrown up. Pt. Was sitting up in bed and stated she felt nauseated and had thrown up in Trash can. Large amount of emesis stained with what appears to be Grape Juice in Trash Can. Pt. Had just consumed 2 Grape Juice cups. Dr. Marcelline Mates has ordered Phenergan and Zofran to alt. Q3h. And Pt. Instructed in NPO statis and v/o. I re-instructed Pt. To call for assit when uob due to sedative effects of her Dilaudid and Phenergan and she v/o.

## 2019-03-30 NOTE — ED Notes (Signed)
Pt in [place]

## 2019-03-30 NOTE — H&P (Signed)
History and Physical    Amy Wolfe [id number] DOB: [birth year] DOA: 03/30/2019  Referring MD/NP/PA:   PCP: Hubbard Hartshorn, FNP   Patient coming from:  The patient is coming from home.  At baseline, pt is independent for most of ADL.        Chief Complaint: Abdominal pain  HPI: Amy Wolfe is a 44 y.o. female with medical history significant of hypertension, GERD, anxiety, chronic pancreatitis, liver abscess, CAD, uterine fibroids, portal vein thrombosis, tobacco abuse, who presents with abdominal pain.  Patient has been having abdominal pain in the past 4 days, which has been progressively worsening.  She states that she has diffused abdominal pain, which is constant, sharp, severe, nonradiating.  She has nausea, and vomited more than 10 times today, with greenish colored vomitus.  No diarrhea.  No fever or chills.  Patient has chronic mild dry cough, no shortness breath, chest pain.  Patient states that she passed out two or three times, but does not want give detailed information.  She does not have unilateral weakness, numbness or tingling in extremities.  No facial droop or slurred speech.  Patient denies drinking alcohol.  She states that she smokes weeds.  Patient states that she is out of her Xarelto for more than 2 weeks.  ED Course: pt was found to have WBC 7.1, troponin 5, lipase 22, negative pregnancy test, pending alcohol level, CK 43, pending COVID-19 PCR, electrolytes renal function okay, temperature normal, blood pressure 111/67 heart rate 65, RR 13, oxygen saturation 92% on room air. CT head is negative for acute intracranial abnormalities. CT angiogram is negative for PE.  # CTA and CT-abd/pelvis showed: 1. No evidence for acute pulmonary embolus. 2. Findings compatible with chronic calcific pancreatitis. 3. Cavernous transformation of the portal vein. 4. Nonspecific moderate volume free fluid in the pelvis. 5. Pulmonary nodules measuring up to 5 mm.   # Pelvis US  showed: 1. Moderate volume complex fluid in the pelvis. 2. Normal appearance of the right and left ovaries.  Review of Systems:   General: no fevers, chills, no body weight gain, has poor appetite, has fatigue HEENT: no blurry vision, hearing changes or sore throat Respiratory: no dyspnea, no coughing, no wheezing CV: no chest pain, no palpitations GI: has nausea, vomiting, abdominal pain, no diarrhea, constipation GU: no dysuria, burning on urination, increased urinary frequency, hematuria  Ext: no leg edema Neuro: no unilateral weakness, numbness, or tingling, no vision change or hearing loss. Has syncope. Skin: no rash, no skin tear. MSK: No muscle spasm, no deformity, no limitation of range of movement in spin Heme: No easy bruising.  Travel history: No recent long distant travel.  Allergy: No Known Allergies  Past Medical History:  Diagnosis Date  . Abdominal pain   . Anxiety   . Coronary artery disease   . Dyspnea   . GERD (gastroesophageal reflux disease)   . H/O blood clots   . Hypertension   . Liver abscess   . Pancreatitis   . PONV (postoperative nausea and vomiting) 11/07/2018  . Thyroid disease   . Uterine fibroid     Past Surgical History:  Procedure Laterality Date  . CESAREAN SECTION    . EMBOLIZATION N/A 06/14/2018   Procedure: Uterine Embolization;  Surgeon: Algernon Huxley, MD;  Location: [facility];  Service: Cardiovascular;  Laterality: N/A;  . laceration finger Right   . LAPAROSCOPIC VAGINAL HYSTERECTOMY WITH SALPINGECTOMY Bilateral 10/28/2018   Procedure: LAPAROSCOPIC ASSISTED VAGINAL  HYSTERECTOMY BILATERAL WITH SALPINGECTOMY;  Surgeon: Rubie Maid, MD;  Location: [facility];  Service: Gynecology;  Laterality: Bilateral;  . THYROIDECTOMY, PARTIAL    . VAGINAL HYSTERECTOMY Bilateral 10/28/2018   Procedure: HYSTERECTOMY VAGINAL WITH BILATERAL SALPINGECTOMY;  Surgeon: Rubie Maid, MD;  Location: [facility];  Service: Gynecology;  Laterality:  Bilateral;    Social History:  reports that she has been smoking cigarettes. She has a 17.50 pack-year smoking history. She has never used smokeless tobacco. She reports previous alcohol use. She reports current drug use. Drug: Marijuana.  Family History:  Family History  Problem Relation Age of Onset  . Hypertension Mother   . Diabetes Mother   . Cancer Father   . Cancer Paternal Grandmother   . Cancer Paternal Grandfather   . Aneurysm Brother      Prior to Admission medications   Medication Sig Start Date End Date Taking? Authorizing Provider  acetaminophen (TYLENOL 8 HOUR) 650 MG CR tablet Take 1 tablet (650 mg total) by mouth every 8 (eight) hours as needed for pain. 10/30/18  Yes Rubie Maid, MD  amLODipine (NORVASC) 10 MG tablet Take 10 mg by mouth at bedtime.   Yes [provider]  CREON 36000 units CPEP capsule TAKE 2 CAPSULES BY MOUTH WITH EACH MEAL AND 1 CAPSULE WITH [facility] Patient taking differently: Take 36,000-72,000 Units by mouth See admin instructions. Take 2 capsules (72000u) by mouth three times daily with meals and take 1 capsule (36000u) by mouth twice daily with snacks 12/04/18  Yes Lucilla Lame, MD  docusate sodium (COLACE) 100 MG capsule Take 1 capsule (100 mg total) by mouth 2 (two) times daily as needed. Patient taking differently: Take 100 mg by mouth daily.  10/04/18  Yes Hubbard Hartshorn, FNP  pantoprazole (PROTONIX) 40 MG tablet Take 1 tablet (40 mg total) by mouth daily. 01/09/19  Yes Rubie Maid, MD  rivaroxaban (XARELTO) 20 MG TABS tablet Take 1 tablet (20 mg total) by mouth daily with supper. 02/06/19  Yes Kris Hartmann, NP    Physical Exam: Vitals:   03/30/19 1430 03/30/19 1500 03/30/19 1530 03/30/19 1541  BP: (!) 114/91 120/86 (!) 131/93   Pulse: 71   (!) 59  Resp: (!) 26 14 11 11   Temp:      TempSrc:      SpO2: 99%   96%  Weight:      Height:       General: Not in acute distress HEENT:       Eyes: PERRL, EOMI, no scleral  icterus.       ENT: No discharge from the ears and nose, no pharynx injection, no tonsillar enlargement.        Neck: No JVD, no bruit, no mass felt. Heme: No neck lymph node enlargement. Cardiac: S1/S2, RRR, No murmurs, No gallops or rubs. Respiratory: No rales, wheezing, rhonchi or rubs. GI: Soft, nondistended, nontender, no rebound pain, no organomegaly, BS present. GU: No hematuria Ext: No pitting leg edema bilaterally. 2+DP/PT pulse bilaterally. Musculoskeletal: No joint deformities, No joint redness or warmth, no limitation of ROM in spin. Skin: No rashes.  Neuro: Alert, oriented X3, cranial nerves II-XII grossly intact, moves all extremities normally Psych: Patient is not psychotic, no suicidal or hemocidal ideation.  Labs on Admission: I have personally reviewed following labs and imaging studies  CBC: Recent Labs  Lab 03/30/19 0947  WBC 7.1  NEUTROABS 4.8  HGB 15.0  HCT 48.2*  MCV 74.5*  PLT 368  Basic Metabolic Panel: Recent Labs  Lab 03/30/19 0947  NA 136  K 4.2  CL 95*  CO2 26  GLUCOSE 136*  BUN 16  CREATININE 0.87  CALCIUM 9.7   GFR: Estimated Creatinine Clearance: 82.8 mL/min (by C-G formula based on SCr of 0.87 mg/dL). Liver Function Tests: Recent Labs  Lab 03/30/19 0947  AST 25  ALT 18  ALKPHOS 76  BILITOT 1.0  PROT 8.6*  ALBUMIN 4.1   Recent Labs  Lab 03/30/19 0947  LIPASE 22   No results for input(s): AMMONIA in the last 168 hours. Coagulation Profile: No results for input(s): INR, PROTIME in the last 168 hours. Cardiac Enzymes: Recent Labs  Lab 03/30/19 0947  CKTOTAL 43   BNP (last 3 results) No results for input(s): PROBNP in the last 8760 hours. HbA1C: No results for input(s): HGBA1C in the last 72 hours. CBG: No results for input(s): GLUCAP in the last 168 hours. Lipid Profile: No results for input(s): CHOL, HDL, LDLCALC, TRIG, CHOLHDL, LDLDIRECT in the last 72 hours. Thyroid Function Tests: No results for input(s):  TSH, T4TOTAL, FREET4, T3FREE, THYROIDAB in the last 72 hours. Anemia Panel: No results for input(s): VITAMINB12, FOLATE, FERRITIN, TIBC, IRON, RETICCTPCT in the last 72 hours. Urine analysis:    Component Value Date/Time   COLORURINE AMBER (A) 11/05/2018 2247   APPEARANCEUR CLOUDY (A) 11/05/2018 2247   LABSPEC 1.040 (H) 11/05/2018 2247   PHURINE 5.0 11/05/2018 2247   GLUCOSEU NEGATIVE 11/05/2018 2247   HGBUR NEGATIVE 11/05/2018 2247   BILIRUBINUR NEGATIVE 11/05/2018 2247   KETONESUR 20 (A) 11/05/2018 2247   PROTEINUR 100 (A) 11/05/2018 2247   NITRITE NEGATIVE 11/05/2018 2247   LEUKOCYTESUR NEGATIVE 11/05/2018 2247   Sepsis Labs: @LABRCNTIP (procalcitonin:4,lacticidven:4) ) Recent Results (from the past 240 hour(s))  Respiratory Panel by RT PCR (Flu A&B, Covid) - Nasopharyngeal Swab     Status: None   Collection Time: 03/30/19  2:05 PM   Specimen: Nasopharyngeal Swab  Result Value Ref Range Status   SARS Coronavirus 2 by RT PCR NEGATIVE NEGATIVE Final    Comment: (NOTE) SARS-CoV-2 target nucleic acids are NOT DETECTED. The SARS-CoV-2 RNA is generally detectable in upper respiratoy specimens during the acute phase of infection. The lowest concentration of SARS-CoV-2 viral copies this assay can detect is 131 copies/mL. A negative result does not preclude SARS-Cov-2 infection and should not be used as the sole basis for treatment or other patient management decisions. A negative result may occur with  improper specimen collection/handling, submission of specimen other than nasopharyngeal swab, presence of viral mutation(s) within the areas targeted by this assay, and inadequate number of viral copies (<131 copies/mL). A negative result must be combined with clinical observations, patient history, and epidemiological information. The expected result is Negative. Fact Sheet for Patients:  PinkCheek.be Fact Sheet for Healthcare Providers:    GravelBags.it This test is not yet ap proved or cleared by the [place] FDA and  has been authorized for detection and/or diagnosis of SARS-CoV-2 by FDA under an Emergency Use Authorization (EUA). This EUA will remain  in effect (meaning this test can be used) for the duration of the COVID-19 declaration under Section 564(b)(1) of the Act, 21 U.S.C. section 360bbb-3(b)(1), unless the authorization is terminated or revoked sooner.    Influenza A by PCR NEGATIVE NEGATIVE Final   Influenza B by PCR NEGATIVE NEGATIVE Final    Comment: (NOTE) The Xpert Xpress SARS-CoV-2/FLU/RSV assay is intended as an aid in  the diagnosis of  influenza from Nasopharyngeal swab specimens and  should not be used as a sole basis for treatment. Nasal washings and  aspirates are unacceptable for Xpert Xpress SARS-CoV-2/FLU/RSV  testing. Fact Sheet for Patients: PinkCheek.be Fact Sheet for Healthcare Providers: GravelBags.it This test is not yet approved or cleared by the [place] FDA and  has been authorized for detection and/or diagnosis of SARS-CoV-2 by  FDA under an Emergency Use Authorization (EUA). This EUA will remain  in effect (meaning this test can be used) for the duration of the  Covid-19 declaration under Section 564(b)(1) of the Act, 21  U.S.C. section 360bbb-3(b)(1), unless the authorization is  terminated or revoked. Performed at [facility], [address] [place], [place] [address]      Radiological Exams on Admission: CT Head Wo Contrast  Result Date: 03/30/2019 CLINICAL DATA:  Trauma, headache EXAM: CT HEAD WITHOUT CONTRAST TECHNIQUE: Contiguous axial images were obtained from the base of the skull through the vertex without intravenous contrast. COMPARISON:  None. FINDINGS: Brain: No evidence of acute infarction, hemorrhage, hydrocephalus, extra-axial collection or mass  lesion/mass effect. Vascular: No hyperdense vessel or unexpected calcification. Skull: No osseous abnormality. Sinuses/Orbits: Visualized paranasal sinuses are clear. Visualized mastoid sinuses are clear. Visualized orbits demonstrate no focal abnormality. Other: None IMPRESSION: No acute intracranial pathology. Electronically Signed   By: Kathreen Devoid   On: 03/30/2019 11:50   CT Angio Chest PE W and/or Wo Contrast  Result Date: 03/30/2019 CLINICAL DATA:  Patient with diffuse pain. Prior history of pancreatitis. Vomiting. EXAM: CT ANGIOGRAPHY CHEST CT ABDOMEN AND PELVIS WITH CONTRAST TECHNIQUE: Multidetector CT imaging of the chest was performed using the standard protocol during bolus administration of intravenous contrast. Multiplanar CT image reconstructions and MIPs were obtained to evaluate the vascular anatomy. Multidetector CT imaging of the abdomen and pelvis was performed using the standard protocol during bolus administration of intravenous contrast. CONTRAST:  12mL OMNIPAQUE IOHEXOL 350 MG/ML SOLN COMPARISON:  CT abdomen pelvis 11/18/2018 FINDINGS: CTA CHEST FINDINGS Cardiovascular: Normal heart size. Aorta and main pulmonary artery normal in caliber. Adequate opacification of the pulmonary arterial system. No intraluminal filling defects identified to suggest acute pulmonary embolus. Mediastinum/Nodes: No enlarged axillary, mediastinal or hilar lymphadenopathy. Normal appearance of the esophagus. Lungs/Pleura: Central airways are patent. Dependent atelectasis. 5 mm right lower lobe nodule (image 53; series 6). 4 mm left lower lobe nodule (image 63; series 6). No pleural effusion or pneumothorax. Musculoskeletal: No aggressive or acute appearing osseous lesions. Review of the MIP images confirms the above findings. CT ABDOMEN and PELVIS FINDINGS Hepatobiliary: Liver is normal in size and contour. No focal lesion identified. Gallbladder is unremarkable. Cavernous transformation of the portal vein.  Pancreas: Multiple calcifications within the pancreatic head/uncinate process compatible with chronic calcific pancreatitis. Similar low-attenuation region within the pancreatic head, previously evaluated with MRI representing a small cystic lesion. Spleen: Unremarkable Adrenals/Urinary Tract: Normal adrenal glands. Kidneys enhance symmetrically with contrast. No hydronephrosis. Urinary bladder is unremarkable. Stomach/Bowel: Normal morphology of the stomach. No evidence for small bowel obstruction. No free intraperitoneal air. Normal appendix. Vascular/Lymphatic: Normal caliber abdominal aorta. No retroperitoneal lymphadenopathy. Multiple collateral vessels within the left upper quadrant. Reproductive: Prior hysterectomy. Adnexal structures unremarkable. Moderate volume free fluid in the pelvis. Other: None. Musculoskeletal: No aggressive or acute appearing osseous lesions. Review of the MIP images confirms the above findings. IMPRESSION: 1. No evidence for acute pulmonary embolus. 2. Findings compatible with chronic calcific pancreatitis. 3. Cavernous transformation of the portal vein. 4. Nonspecific moderate  volume free fluid in the pelvis. 5. Pulmonary nodules measuring up to 5 mm. No follow-up needed if patient is low-risk (and has no known or suspected primary neoplasm). Non-contrast chest CT can be considered in 12 months if patient is high-risk. This recommendation follows the consensus statement: Guidelines for Management of Incidental Pulmonary Nodules Detected on CT Images: From the Fleischner Society 2017; Radiology 2017; 284:228-243. Electronically Signed   By: Lovey Newcomer M.D.   On: 03/30/2019 11:55   CT ABDOMEN PELVIS W CONTRAST  Result Date: 03/30/2019 CLINICAL DATA:  Patient with diffuse pain. Prior history of pancreatitis. Vomiting. EXAM: CT ANGIOGRAPHY CHEST CT ABDOMEN AND PELVIS WITH CONTRAST TECHNIQUE: Multidetector CT imaging of the chest was performed using the standard protocol during  bolus administration of intravenous contrast. Multiplanar CT image reconstructions and MIPs were obtained to evaluate the vascular anatomy. Multidetector CT imaging of the abdomen and pelvis was performed using the standard protocol during bolus administration of intravenous contrast. CONTRAST:  51mL OMNIPAQUE IOHEXOL 350 MG/ML SOLN COMPARISON:  CT abdomen pelvis 11/18/2018 FINDINGS: CTA CHEST FINDINGS Cardiovascular: Normal heart size. Aorta and main pulmonary artery normal in caliber. Adequate opacification of the pulmonary arterial system. No intraluminal filling defects identified to suggest acute pulmonary embolus. Mediastinum/Nodes: No enlarged axillary, mediastinal or hilar lymphadenopathy. Normal appearance of the esophagus. Lungs/Pleura: Central airways are patent. Dependent atelectasis. 5 mm right lower lobe nodule (image 53; series 6). 4 mm left lower lobe nodule (image 63; series 6). No pleural effusion or pneumothorax. Musculoskeletal: No aggressive or acute appearing osseous lesions. Review of the MIP images confirms the above findings. CT ABDOMEN and PELVIS FINDINGS Hepatobiliary: Liver is normal in size and contour. No focal lesion identified. Gallbladder is unremarkable. Cavernous transformation of the portal vein. Pancreas: Multiple calcifications within the pancreatic head/uncinate process compatible with chronic calcific pancreatitis. Similar low-attenuation region within the pancreatic head, previously evaluated with MRI representing a small cystic lesion. Spleen: Unremarkable Adrenals/Urinary Tract: Normal adrenal glands. Kidneys enhance symmetrically with contrast. No hydronephrosis. Urinary bladder is unremarkable. Stomach/Bowel: Normal morphology of the stomach. No evidence for small bowel obstruction. No free intraperitoneal air. Normal appendix. Vascular/Lymphatic: Normal caliber abdominal aorta. No retroperitoneal lymphadenopathy. Multiple collateral vessels within the left upper  quadrant. Reproductive: Prior hysterectomy. Adnexal structures unremarkable. Moderate volume free fluid in the pelvis. Other: None. Musculoskeletal: No aggressive or acute appearing osseous lesions. Review of the MIP images confirms the above findings. IMPRESSION: 1. No evidence for acute pulmonary embolus. 2. Findings compatible with chronic calcific pancreatitis. 3. Cavernous transformation of the portal vein. 4. Nonspecific moderate volume free fluid in the pelvis. 5. Pulmonary nodules measuring up to 5 mm. No follow-up needed if patient is low-risk (and has no known or suspected primary neoplasm). Non-contrast chest CT can be considered in 12 months if patient is high-risk. This recommendation follows the consensus statement: Guidelines for Management of Incidental Pulmonary Nodules Detected on CT Images: From the Fleischner Society 2017; Radiology 2017; 284:228-243. Electronically Signed   By: Lovey Newcomer M.D.   On: 03/30/2019 11:55   US PELVIC COMPLETE W TRANSVAGINAL AND TORSION R/O  Result Date: 03/30/2019 CLINICAL DATA:  Free fluid in the pelvis.  Prior hysterectomy. EXAM: TRANSABDOMINAL AND TRANSVAGINAL ULTRASOUND OF PELVIS DOPPLER ULTRASOUND OF OVARIES TECHNIQUE: Both transabdominal and transvaginal ultrasound examinations of the pelvis were performed. Transabdominal technique was performed for global imaging of the pelvis including uterus, ovaries, adnexal regions, and pelvic cul-de-sac. It was necessary to proceed with endovaginal exam following the transabdominal exam  to visualize the adnexal structures. Color and duplex Doppler ultrasound was utilized to evaluate blood flow to the ovaries. COMPARISON:  CT abdomen pelvis earlier same day FINDINGS: Uterus Surgically absent Right ovary Measurements: 2.1 x 1.7 x 2.6 cm = volume: 4.7 mL. Normal appearance/no adnexal mass. Left ovary Measurements: 3.3 x 2.2 x 2.7 cm = volume: 10.4 mL. Normal appearance/no adnexal mass. Pulsed Doppler evaluation of both  ovaries demonstrates normal low-resistance arterial and venous waveforms. Other findings Moderate volume free fluid in the pelvis. IMPRESSION: Moderate volume complex fluid in the pelvis. Normal appearance of the right and left ovaries. Electronically Signed   By: Lovey Newcomer M.D.   On: 03/30/2019 13:43     EKG: Independently reviewed. Sinus rhythm, QTC 406, possible LAE, T wave inversion in inferior leads, V4-V6  Assessment/Plan Principal Problem:   Abdominal pain Active Problems:   Essential hypertension   Pancreatitis, chronic (HCC)   Portal vein thrombosis   GERD (gastroesophageal reflux disease)   Tobacco abuse   Syncope   Abdominal pain, nausea, vomiting: Etiology is not clear.  Her nausea vomiting could be partially due to marijuana abuse.  The etiology for abdominal pain is not clear. Pt has hx of portal vein thrombosis, she did not take her Xarelto in the past 2 weeks.  She also has chronic pancreatitis. CT scan and US-pelvis showed moderate volume complex fluid in the pelvis, not sure if this is related to her AP. She has negative pregnancy test.  -will place on MedSurg bed for observation -Resume Xarelto for portal vein thrombosis -As needed Dilaudid, Zofran -IV fluid: 1 L normal saline, followed by 100 cc/h -Message sent to OB/GYN, Dr. Marcelline Mates for consultation  HTN: Patient is supposed to take amlodipine, but the patient not taking her medications currently.  Blood pressure 111/67 -Monitor blood pressure closely -hydralazine prn  Pancreatitis, chronic ([place]): lipase 22. -continue Creon  Portal vein thrombosis: -restart Xarelto  GERD (gastroesophageal reflux disease): -Protonix  Tobacco abuse:  -nicotine patch  Syncope: Patient does not want to give detailed information.  CT head is negative.  Patient does not have focal neuro deficits.  May be due to dehydration secondary to nausea vomiting. -IV fluid as above -Check orthostatic vital sign -New frequent neuro  check - check UDS       DVT ppx: on Xarelto Code Status: Full code Family Communication: not done, no family member is at bed side.    Disposition Plan:  Anticipate discharge back to previous home environment Consults called:  Dr. Marcelline Mates of Ob/Gyn Admission status: Med-surg bed for obs    Date of Service 03/30/2019    [facility]   If 7PM-7AM, please contact night-coverage [email or web address] 03/30/2019, 4:21 PM

## 2019-03-30 NOTE — ED Provider Notes (Signed)
[facility] Emergency Department Provider Note  ____________________________________________   First MD Initiated Contact with Patient 03/30/19 [phone]     (approximate)  I have reviewed the triage vital signs and the nursing notes.   HISTORY  Chief Complaint Abdominal Pain    HPI Amy Wolfe is a 44 y.o. female with history of pancreatitis, portal vein thrombosis, prior pelvic abscess after a laparoscopic-assisted vaginal hysterectomy who comes in for abdominal pain.  Patient states that she has been having abdominal pain since Wednesday, severe, all over, nothing makes it better, nothing makes it worse.  Has been associate with some nausea and vomiting.  She states that she has not drink alcohol in almost a year.  Although according to EMS there were recent 58s in the trash can.  Patient repeatedly keeps asking for pain medication I explained to patient that I have to get the rest of the story before we can give her the pain medicine.  Patient continues to ask me every minute for pain medicine.  Patient does report some chest discomfort but denies any shortness of breath.  Patient states that she is been noncompliant with her blood thinner because she ran out.  States that she is on the blood thinner for prior abdominal clots that are chronic.  This happened 1 to 2 weeks ago although she denies any chest pain or shortness of breath.  She does state though that the pain has been so bad that she has passed out and has been laying in the tub since Wednesday.  She does not think she is hit her head.  She does use marijuana.             Past Medical History:  Diagnosis Date  . Abdominal pain   . Anxiety   . Coronary artery disease   . Dyspnea   . GERD (gastroesophageal reflux disease)   . H/O blood clots   . Hypertension   . Liver abscess   . Pancreatitis   . PONV (postoperative nausea and vomiting) 11/07/2018  . Thyroid disease   . Uterine fibroid      Patient Active Problem List   Diagnosis Date Noted  . PONV (postoperative nausea and vomiting) 11/07/2018  . Vaginal cuff cellulitis 11/04/2018  . Status post laparoscopic hysterectomy 10/28/2018  . Anticoagulated 10/04/2018  . Pelvic pain 10/04/2018  . 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 History of partial thyroidectomy 10/04/2018  . Symptomatic anemia 06/13/2018  . Swelling of limb 05/17/2018  . Essential hypertension 05/17/2018  . Pancreatitis, chronic ([place]) 05/17/2018  . Portal vein thrombosis 05/17/2018    Past Surgical History:  Procedure Laterality Date  . CESAREAN SECTION    . EMBOLIZATION N/A 06/14/2018   Procedure: Uterine Embolization;  Surgeon: Algernon Huxley, MD;  Location: [place] [facility];  Service: Cardiovascular;  Laterality: N/A;  . laceration finger Right   . LAPAROSCOPIC VAGINAL HYSTERECTOMY WITH SALPINGECTOMY Bilateral 10/28/2018   Procedure: LAPAROSCOPIC ASSISTED VAGINAL HYSTERECTOMY BILATERAL WITH SALPINGECTOMY;  Surgeon: Rubie Maid, MD;  Location: [facility] ORS;  Service: Gynecology;  Laterality: Bilateral;  . THYROIDECTOMY, PARTIAL    . VAGINAL HYSTERECTOMY Bilateral 10/28/2018   Procedure: HYSTERECTOMY VAGINAL WITH BILATERAL SALPINGECTOMY;  Surgeon: Rubie Maid, MD;  Location: [facility] ORS;  Service: Gynecology;  Laterality: Bilateral;    Prior to Admission medications   Medication Sig Start Date End Date Taking? Authorizing Provider  acetaminophen (TYLENOL 8 HOUR) 650 MG CR tablet Take 1 tablet (650 mg  total) by mouth every 8 (eight) hours as needed for pain. 10/30/18   Rubie Maid, MD  amLODipine (NORVASC) 10 MG tablet Take 1 tablet (10 mg total) by mouth daily. Patient taking differently: Take 10 mg by mouth at bedtime.  08/15/18 11/15/18  Rubie Maid, MD  CREON 36000 units CPEP capsule TAKE 2 CAPSULES BY MOUTH WITH EACH MEAL AND 1 CAPSULE WITH EACH [facility] 12/04/18   Lucilla Lame, MD  docusate sodium (COLACE) 100 MG capsule Take  1 capsule (100 mg total) by mouth 2 (two) times daily as needed. Patient taking differently: Take 100 mg by mouth daily.  10/04/18   Hubbard Hartshorn, FNP  ferrous sulfate (FERROUSUL) 325 (65 FE) MG tablet Take 1 tablet (325 mg total) by mouth daily with breakfast. Patient not taking: Reported on 11/15/2018 11/10/18   Rubie Maid, MD  HYDROmorphone (DILAUDID) 4 MG tablet Take 1 tablet (4 mg total) by mouth every 6 (six) hours as needed for moderate pain or severe pain. 11/10/18   Rubie Maid, MD  metroNIDAZOLE (FLAGYL) 500 MG tablet Take 1 tablet (500 mg total) by mouth 2 (two) times daily. 11/10/18   Rubie Maid, MD  pantoprazole (PROTONIX) 40 MG tablet Take 1 tablet (40 mg total) by mouth daily. 01/09/19   Rubie Maid, MD  promethazine (PHENERGAN) 25 MG tablet Take 1 tablet (25 mg total) by mouth every 6 (six) hours as needed for nausea or vomiting. 11/10/18   Rubie Maid, MD  rivaroxaban (XARELTO) 20 MG TABS tablet Take 1 tablet (20 mg total) by mouth daily with supper. 02/06/19   Kris Hartmann, NP  sulfamethoxazole-trimethoprim (BACTRIM DS) 800-160 MG tablet Take 1 tablet by mouth 2 (two) times daily. 11/10/18   Rubie Maid, MD    Allergies Patient has no known allergies.  Family History  Problem Relation Age of Onset  . Hypertension Mother   . Diabetes Mother   . Cancer Father   . Cancer Paternal Grandmother   . Cancer Paternal Grandfather   . Aneurysm Brother     Social History Social History   Tobacco Use  . Smoking status: Current Every Day Smoker    Packs/day: 0.50    Years: 35.00    Pack years: 17.50    Types: Cigarettes  . Smokeless tobacco: Never Used  Substance Use Topics  . Alcohol use: Not Currently  . Drug use: Yes    Types: Marijuana    Comment: daily      Review of Systems Constitutional: No fever/chills Eyes: No visual changes. ENT: No sore throat. Cardiovascular: Denies chest pain. Respiratory: Denies shortness of breath. Gastrointestinal:  Positive abdominal pain, nausea, vomiting Genitourinary: Negative for dysuria. Musculoskeletal: Negative for back pain. Skin: Negative for rash. Neurological: Negative for headaches, focal weakness or numbness. All other ROS negative ____________________________________________   PHYSICAL EXAM:  VITAL SIGNS: Blood pressure 112/78, pulse 99, temperature 98.4 F (36.9 C), temperature source Oral, resp. rate 18, height 5\' 7"  (1.702 m), weight 72.6 kg, last menstrual period 09/30/2018, SpO2 99 %.   Constitutional: Alert and oriented.  Patient continuously requesting for medications and interruptinG my evaluation of her.. Eyes: Conjunctivae are normal. EOMI. Head: Atraumatic. Nose: No congestion/rhinnorhea. Mouth/Throat: Mucous membranes are moist.   Neck: No stridor. Trachea Midline. FROM.  No C-spine tenderness Cardiovascular: Normal rate, regular rhythm. Grossly normal heart sounds.  Good peripheral circulation. Respiratory: Normal respiratory effort.  No retractions. Lungs CTAB. Gastrointestinal: Diffusely tender.  No distention. No abdominal bruits.  Musculoskeletal: No lower  extremity tenderness nor edema.  No joint effusions. Neurologic:  Normal speech and language. No gross focal neurologic deficits are appreciated.  Skin:  Skin is warm, dry and intact. No rash noted. Psychiatric: Mood and affect are normal. Speech and behavior are normal. GU: Deferred   ____________________________________________   LABS (all labs ordered are listed, but only abnormal results are displayed)  Labs Reviewed  CBC WITH DIFFERENTIAL/PLATELET - Abnormal; Notable for the following components:      Result Value   RBC 6.47 (*)    HCT 48.2 (*)    MCV 74.5 (*)    MCH 23.2 (*)    RDW 20.1 (*)    All other components within normal limits  COMPREHENSIVE METABOLIC PANEL - Abnormal; Notable for the following components:   Chloride 95 (*)    Glucose, Bld 136 (*)    Total Protein 8.6 (*)    All  other components within normal limits  RESPIRATORY PANEL BY RT PCR (FLU A&B, COVID)  HCG, QUANTITATIVE, PREGNANCY  LIPASE, BLOOD  CK  URINALYSIS, COMPLETE (UACMP) WITH MICROSCOPIC  URINE DRUG SCREEN, QUALITATIVE ([facility] ONLY)  ETHANOL  BASIC METABOLIC PANEL  CBC  POC URINE PREG, ED  TROPONIN I (HIGH SENSITIVITY)  TROPONIN I (HIGH SENSITIVITY)   ____________________________________________   ED ECG REPORT I, Vanessa [place], the attending physician, personally viewed and interpreted this ECG.  EKG shows sinus rhythm rate of 93, no ST elevation but does have T wave inversions in 2 3 aVF V4 through V6, normal intervals  ____________________________________________  RADIOLOGY   Official radiology report(s): CT Head Wo Contrast  Result Date: 03/30/2019 CLINICAL DATA:  Trauma, headache EXAM: CT HEAD WITHOUT CONTRAST TECHNIQUE: Contiguous axial images were obtained from the base of the skull through the vertex without intravenous contrast. COMPARISON:  None. FINDINGS: Brain: No evidence of acute infarction, hemorrhage, hydrocephalus, extra-axial collection or mass lesion/mass effect. Vascular: No hyperdense vessel or unexpected calcification. Skull: No osseous abnormality. Sinuses/Orbits: Visualized paranasal sinuses are clear. Visualized mastoid sinuses are clear. Visualized orbits demonstrate no focal abnormality. Other: None IMPRESSION: No acute intracranial pathology. Electronically Signed   By: Kathreen Devoid   On: 03/30/2019 11:50   CT Angio Chest PE W and/or Wo Contrast  Result Date: 03/30/2019 CLINICAL DATA:  Patient with diffuse pain. Prior history of pancreatitis. Vomiting. EXAM: CT ANGIOGRAPHY CHEST CT ABDOMEN AND PELVIS WITH CONTRAST TECHNIQUE: Multidetector CT imaging of the chest was performed using the standard protocol during bolus administration of intravenous contrast. Multiplanar CT image reconstructions and MIPs were obtained to evaluate the vascular anatomy. Multidetector CT  imaging of the abdomen and pelvis was performed using the standard protocol during bolus administration of intravenous contrast. CONTRAST:  34mL OMNIPAQUE IOHEXOL 350 MG/ML SOLN COMPARISON:  CT abdomen pelvis 11/18/2018 FINDINGS: CTA CHEST FINDINGS Cardiovascular: Normal heart size. Aorta and main pulmonary artery normal in caliber. Adequate opacification of the pulmonary arterial system. No intraluminal filling defects identified to suggest acute pulmonary embolus. Mediastinum/Nodes: No enlarged axillary, mediastinal or hilar lymphadenopathy. Normal appearance of the esophagus. Lungs/Pleura: Central airways are patent. Dependent atelectasis. 5 mm right lower lobe nodule (image 53; series 6). 4 mm left lower lobe nodule (image 63; series 6). No pleural effusion or pneumothorax. Musculoskeletal: No aggressive or acute appearing osseous lesions. Review of the MIP images confirms the above findings. CT ABDOMEN and PELVIS FINDINGS Hepatobiliary: Liver is normal in size and contour. No focal lesion identified. Gallbladder is unremarkable. Cavernous transformation of the portal vein. Pancreas: Multiple  calcifications within the pancreatic head/uncinate process compatible with chronic calcific pancreatitis. Similar low-attenuation region within the pancreatic head, previously evaluated with MRI representing a small cystic lesion. Spleen: Unremarkable Adrenals/Urinary Tract: Normal adrenal glands. Kidneys enhance symmetrically with contrast. No hydronephrosis. Urinary bladder is unremarkable. Stomach/Bowel: Normal morphology of the stomach. No evidence for small bowel obstruction. No free intraperitoneal air. Normal appendix. Vascular/Lymphatic: Normal caliber abdominal aorta. No retroperitoneal lymphadenopathy. Multiple collateral vessels within the left upper quadrant. Reproductive: Prior hysterectomy. Adnexal structures unremarkable. Moderate volume free fluid in the pelvis. Other: None. Musculoskeletal: No aggressive or  acute appearing osseous lesions. Review of the MIP images confirms the above findings. IMPRESSION: 1. No evidence for acute pulmonary embolus. 2. Findings compatible with chronic calcific pancreatitis. 3. Cavernous transformation of the portal vein. 4. Nonspecific moderate volume free fluid in the pelvis. 5. Pulmonary nodules measuring up to 5 mm. No follow-up needed if patient is low-risk (and has no known or suspected primary neoplasm). Non-contrast chest CT can be considered in 12 months if patient is high-risk. This recommendation follows the consensus statement: Guidelines for Management of Incidental Pulmonary Nodules Detected on CT Images: From the Fleischner Society 2017; Radiology 2017; 284:228-243. Electronically Signed   By: Lovey Newcomer M.D.   On: 03/30/2019 11:55   CT ABDOMEN PELVIS W CONTRAST  Result Date: 03/30/2019 CLINICAL DATA:  Patient with diffuse pain. Prior history of pancreatitis. Vomiting. EXAM: CT ANGIOGRAPHY CHEST CT ABDOMEN AND PELVIS WITH CONTRAST TECHNIQUE: Multidetector CT imaging of the chest was performed using the standard protocol during bolus administration of intravenous contrast. Multiplanar CT image reconstructions and MIPs were obtained to evaluate the vascular anatomy. Multidetector CT imaging of the abdomen and pelvis was performed using the standard protocol during bolus administration of intravenous contrast. CONTRAST:  36mL OMNIPAQUE IOHEXOL 350 MG/ML SOLN COMPARISON:  CT abdomen pelvis 11/18/2018 FINDINGS: CTA CHEST FINDINGS Cardiovascular: Normal heart size. Aorta and main pulmonary artery normal in caliber. Adequate opacification of the pulmonary arterial system. No intraluminal filling defects identified to suggest acute pulmonary embolus. Mediastinum/Nodes: No enlarged axillary, mediastinal or hilar lymphadenopathy. Normal appearance of the esophagus. Lungs/Pleura: Central airways are patent. Dependent atelectasis. 5 mm right lower lobe nodule (image 53; series 6).  4 mm left lower lobe nodule (image 63; series 6). No pleural effusion or pneumothorax. Musculoskeletal: No aggressive or acute appearing osseous lesions. Review of the MIP images confirms the above findings. CT ABDOMEN and PELVIS FINDINGS Hepatobiliary: Liver is normal in size and contour. No focal lesion identified. Gallbladder is unremarkable. Cavernous transformation of the portal vein. Pancreas: Multiple calcifications within the pancreatic head/uncinate process compatible with chronic calcific pancreatitis. Similar low-attenuation region within the pancreatic head, previously evaluated with MRI representing a small cystic lesion. Spleen: Unremarkable Adrenals/Urinary Tract: Normal adrenal glands. Kidneys enhance symmetrically with contrast. No hydronephrosis. Urinary bladder is unremarkable. Stomach/Bowel: Normal morphology of the stomach. No evidence for small bowel obstruction. No free intraperitoneal air. Normal appendix. Vascular/Lymphatic: Normal caliber abdominal aorta. No retroperitoneal lymphadenopathy. Multiple collateral vessels within the left upper quadrant. Reproductive: Prior hysterectomy. Adnexal structures unremarkable. Moderate volume free fluid in the pelvis. Other: None. Musculoskeletal: No aggressive or acute appearing osseous lesions. Review of the MIP images confirms the above findings. IMPRESSION: 1. No evidence for acute pulmonary embolus. 2. Findings compatible with chronic calcific pancreatitis. 3. Cavernous transformation of the portal vein. 4. Nonspecific moderate volume free fluid in the pelvis. 5. Pulmonary nodules measuring up to 5 mm. No follow-up needed if patient is low-risk (and has  no known or suspected primary neoplasm). Non-contrast chest CT can be considered in 12 months if patient is high-risk. This recommendation follows the consensus statement: Guidelines for Management of Incidental Pulmonary Nodules Detected on CT Images: From the Fleischner Society 2017; Radiology  2017; 284:228-243. Electronically Signed   By: Lovey Newcomer M.D.   On: 03/30/2019 11:55   US PELVIC COMPLETE W TRANSVAGINAL AND TORSION R/O  Result Date: 03/30/2019 CLINICAL DATA:  Free fluid in the pelvis.  Prior hysterectomy. EXAM: TRANSABDOMINAL AND TRANSVAGINAL ULTRASOUND OF PELVIS DOPPLER ULTRASOUND OF OVARIES TECHNIQUE: Both transabdominal and transvaginal ultrasound examinations of the pelvis were performed. Transabdominal technique was performed for global imaging of the pelvis including uterus, ovaries, adnexal regions, and pelvic cul-de-sac. It was necessary to proceed with endovaginal exam following the transabdominal exam to visualize the adnexal structures. Color and duplex Doppler ultrasound was utilized to evaluate blood flow to the ovaries. COMPARISON:  CT abdomen pelvis earlier same day FINDINGS: Uterus Surgically absent Right ovary Measurements: 2.1 x 1.7 x 2.6 cm = volume: 4.7 mL. Normal appearance/no adnexal mass. Left ovary Measurements: 3.3 x 2.2 x 2.7 cm = volume: 10.4 mL. Normal appearance/no adnexal mass. Pulsed Doppler evaluation of both ovaries demonstrates normal low-resistance arterial and venous waveforms. Other findings Moderate volume free fluid in the pelvis. IMPRESSION: Moderate volume complex fluid in the pelvis. Normal appearance of the right and left ovaries. Electronically Signed   By: Lovey Newcomer M.D.   On: 03/30/2019 13:43    ____________________________________________   PROCEDURES  Procedure(s) performed (including Critical Care):  Procedures   ____________________________________________   INITIAL IMPRESSION / ASSESSMENT AND PLAN / ED COURSE  Lorita Suri was evaluated in Emergency Department on 03/30/2019 for the symptoms described in the history of present illness. She was evaluated in the context of the global COVID-19 pandemic, which necessitated consideration that the patient might be at risk for infection with the SARS-CoV-2 virus that causes  COVID-19. Institutional protocols and algorithms that pertain to the evaluation of patients at risk for COVID-19 are in a state of rapid change based on information released by regulatory bodies including the CDC and federal and state organizations. These policies and algorithms were followed during the patient's care in the ED.     Patient is a 44 year old who comes in with severe abdominal pain.  Patient has a history of pancreatitis as well as abscesses.  Will get CT scan to evaluate for pancreatitis, recurrent abscesses, perforation, SBO.  Patient also has new T wave inversions and did report a little bit of chest discomfort.  Patient's been off of her blood thinner.  Will get CT PE to make sure no evidence of PE.  We will also get CT head given recurrent syncopal episodes most likely secondary to pain but to make sure is no evidence of intracranial hemorrhage.  Patient is having occasional desats but seems to be more like when she falls asleep.  Again this is likely reason why we should be doing the CT PE scan.  Reevaluated patient and updated on this plan.  Patient requesting more pain meds but patient looks pretty comfortable and was sleeping when I went into the room.  Patient also requesting some fluids.  We will give a liter.  Patient's labs are reassuring.  No evidence of heart attack, CK level normal.  Pregnancy test normal.  No white count elevation to suggest infection.  Kidney function is normal and lipase is normal  CT head was negative  CT PE  negative.  CT abdomen shows chronic calcific pancreatitis however there is concern for moderate volume of free fluid in the pelvis.   Patient's pregnancy test was negative so I do not think this is an ectopic.  Although I did review her prior CT scan and there was no evidence of free fluid at that time. Will get TV US.  Patient has a pulmonary nodule and was given the report for this.   TV US ovaries normal but does have complex free  fluid  D/w Dr. Marcelline Mates who states this could be from ruptured hemorrhagic cyst but cant say for sure since no changes on ovaries. Pt denies any recent MVC to suggest bowel perf missed on CT.  Given extent of pain and new free fluid in abdomen I think its best to admit patient for continued abdominal exam, trending hemoglobin and further observation.   D/w hospital team who will admit.   ____________________________________________   FINAL CLINICAL IMPRESSION(S) / ED DIAGNOSES   Final diagnoses:  Abdominal pain  Free fluid in pelvis      MEDICATIONS GIVEN DURING THIS VISIT:  Medications  HYDROmorphone (DILAUDID) injection 1 mg (1 mg Intravenous Given 03/30/19 1716)  ondansetron (ZOFRAN) injection 4 mg (has no administration in time range)  0.9 %  sodium chloride infusion (has no administration in time range)  nicotine (NICODERM CQ - dosed in mg/24 hours) patch 21 mg (has no administration in time range)  acetaminophen (TYLENOL) tablet 650 mg (650 mg Oral Given 03/30/19 1716)  dextromethorphan-guaiFENesin (MUCINEX DM) 30-600 MG per 12 hr tablet 1 tablet (has no administration in time range)  docusate sodium (COLACE) capsule 100 mg (has no administration in time range)  pantoprazole (PROTONIX) EC tablet 40 mg (has no administration in time range)  rivaroxaban (XARELTO) tablet 20 mg (has no administration in time range)  hydrALAZINE (APRESOLINE) tablet 25 mg (has no administration in time range)  albuterol (PROVENTIL) (2.5 MG/3ML) 0.083% nebulizer solution 2.5 mg (has no administration in time range)  lipase/protease/amylase (CREON) capsule 72,000 Units (has no administration in time range)  lipase/protease/amylase (CREON) capsule 36,000 Units (has no administration in time range)  ondansetron (ZOFRAN) injection 4 mg (4 mg Intravenous Given 03/30/19 0953)  HYDROmorphone (DILAUDID) injection 0.5 mg (0.5 mg Intravenous Given 03/30/19 0953)  iohexol (OMNIPAQUE) 350 MG/ML injection 75 mL (75  mLs Intravenous Contrast Given 03/30/19 1113)  sodium chloride 0.9 % bolus 1,000 mL (0 mLs Intravenous Stopped 03/30/19 1653)  HYDROmorphone (DILAUDID) injection 0.5 mg (0.5 mg Intravenous Given 03/30/19 1403)     ED Discharge Orders    None       Note:  This document was prepared using Dragon voice recognition software and may include unintentional dictation errors.   Vanessa [place], MD 03/30/19 1728

## 2019-03-30 NOTE — ED Notes (Signed)
Pt desatted ot 85% RA, placed on 2 L [place]. Came up to 93%. Will continue to monitor oxygen levels.

## 2019-03-30 NOTE — Progress Notes (Signed)
Placed on Continues Pulse ox. Due to Sedative effects of Medications.

## 2019-03-30 NOTE — Progress Notes (Signed)
2308 B/P noted to be 90/50. Pt. Assessed at this time and she is appears to be asleep, lying on her left side. Awoke easily and repositioned herself to Supine Position. Alert and oriented with aprop. Affect. Denies c/o. B/P is 93/60, O2 Sat is 97% Room air. Will cont. To watch closely.

## 2019-03-30 NOTE — ED Notes (Signed)
Pt seen walking out of room 10 with a gentleman and with her ekg codes attached and iv. Called out "ma'am" and she yelled I will be right back and went out toward lobby. Cut thru and advised in the lobby she could not leave the hospital with an iv in. Again said "i'll be right back" and walked out the door. Charge nurse and security aware. Pt not ivc'd so security could not stop her. Charge nurse walked out and had the pt return.

## 2019-03-30 NOTE — Progress Notes (Signed)
Patient stating she is very nauseous, and that pain makes her nauseous. Zofran given. Not time for PRN pain med yet. Patient updated about when she can have pain med. Patient requesting to shower. RN discussed safety concerns and encouraged patient not to shower at this time. Patient states she is going to shower and "that's the only thing that makes the pain better." RN asked patient to wait until night shift RN assesses her and wraps her IV, as it is currently shift change. Patient states "ok hurry up." Night nurse alerted.

## 2019-03-30 NOTE — ED Notes (Signed)
Pt taken to CT via wheelchair. Pt had vomited in bed. Bed cleaned, linen changed.

## 2019-03-30 NOTE — Consult Note (Signed)
Reason for Consult:  Referring Physician:   [name] is an 44 y.o. [phone] female who presented to the Emergency Room for complaints of abdominal pain over the past 2 weeks, progressively worsening over the past 4 days.  She has had nausea/vomiting for the past several days as well, with greenish-colored vomitus.  She denies fevers or chills. She has a PMH significant for  hypertension, GERD, anxiety, chronic pancreatitis, liver abscess, CAD, uterine fibroids, portal vein thrombosis, tobacco abuse. She reports similar intermittent episodes like this in the past, with sudden onset of severe abdominal pain with nausea/vomiting.  Of note patient is s/p laparoscopic hysterectomy 5 months ago. GYN consulted today due to findings of free fluid in pelvis on abdominal CT scan and ultrasound with no other significant findings noted.     Pertinent Gynecological History: Menses: none. Is s/p hysterectomy Blood transfusions: history of blood transfusions in the past due to anemia from uterine fibroids and heavy menses Sexually transmitted diseases: no past history Previous GYN Procedures: uterine artery embolization, hysterectomy  Last mammogram: none, patient has never had one    OB History  Gravida Para Term Preterm AB Living  4 2 2   2 2   SAB TAB Ectopic Multiple Live Births  1 1     2     # Outcome Date GA Lbr Len/2nd Weight Sex Delivery Anes PTL Lv  4 TAB           3 SAB           2 Term           1 Term              Past Medical History:  Diagnosis Date  . Abdominal pain   . Anxiety   . Coronary artery disease   . Dyspnea   . GERD (gastroesophageal reflux disease)   . H/O blood clots   . Hypertension   . Liver abscess   . Pancreatitis   . PONV (postoperative nausea and vomiting) 11/07/2018  . Thyroid disease   . Uterine fibroid     Past Surgical History:  Procedure Laterality Date  . CESAREAN SECTION    . EMBOLIZATION N/A 06/14/2018   Procedure: Uterine Embolization;   Surgeon: Algernon Huxley, MD;  Location: [facility];  Service: Cardiovascular;  Laterality: N/A;  . laceration finger Right   . LAPAROSCOPIC VAGINAL HYSTERECTOMY WITH SALPINGECTOMY Bilateral 10/28/2018   Procedure: LAPAROSCOPIC ASSISTED VAGINAL HYSTERECTOMY BILATERAL WITH SALPINGECTOMY;  Surgeon: Rubie Maid, MD;  Location: [facility];  Service: Gynecology;  Laterality: Bilateral;  . THYROIDECTOMY, PARTIAL    . VAGINAL HYSTERECTOMY Bilateral 10/28/2018   Procedure: HYSTERECTOMY VAGINAL WITH BILATERAL SALPINGECTOMY;  Surgeon: Rubie Maid, MD;  Location: [facility];  Service: Gynecology;  Laterality: Bilateral;    Family History  Problem Relation Age of Onset  . Hypertension Mother   . Diabetes Mother   . Cancer Father   . Cancer Paternal Grandmother   . Cancer Paternal Grandfather   . Aneurysm Brother     Social History:  reports that she has been smoking cigarettes. She has a 17.50 pack-year smoking history. She has never used smokeless tobacco. She reports previous alcohol use. She reports current drug use. Drug: Marijuana.  Allergies: No Known Allergies  Medications:  I have reviewed the patient's current medications. Prior to Admission:  Medications Prior to Admission  Medication Sig Dispense Refill Last Dose  . acetaminophen (TYLENOL 8 HOUR) 650 MG CR tablet Take  1 tablet (650 mg total) by mouth every 8 (eight) hours as needed for pain. 30 tablet 0 Unknown at PRN  . amLODipine (NORVASC) 10 MG tablet Take 10 mg by mouth at bedtime.   14+ days at Unknown  . CREON 36000 units CPEP capsule TAKE 2 CAPSULES BY MOUTH WITH EACH MEAL AND 1 CAPSULE WITH EACH SNACK (Patient taking differently: Take 36,000-72,000 Units by mouth See admin instructions. Take 2 capsules (72000u) by mouth three times daily with meals and take 1 capsule (36000u) by mouth twice daily with snacks) 210 capsule 0 14+ days at Unknown  . docusate sodium (COLACE) 100 MG capsule Take 1 capsule (100 mg total) by  mouth 2 (two) times daily as needed. (Patient taking differently: Take 100 mg by mouth daily. ) 30 capsule 2   . pantoprazole (PROTONIX) 40 MG tablet Take 1 tablet (40 mg total) by mouth daily. 30 tablet 0 14+ days at Unknown  . rivaroxaban (XARELTO) 20 MG TABS tablet Take 1 tablet (20 mg total) by mouth daily with supper. 30 tablet 5 14+ days at Unknown    Review of Systems  Constitutional: Negative for chills and fever.  HENT: Negative for congestion, rhinorrhea, sneezing and sore throat.   Eyes: Negative.   Respiratory: Negative for shortness of breath and wheezing.   Cardiovascular: Negative for chest pain, palpitations and leg swelling.  Gastrointestinal: Positive for abdominal pain, nausea and vomiting. Negative for abdominal distention, anal bleeding, blood in stool, constipation and diarrhea.  Endocrine: Negative.   Genitourinary: Negative for difficulty urinating, dysuria, flank pain, urgency, vaginal bleeding, vaginal discharge and vaginal pain.  Musculoskeletal: Positive for back pain. Negative for arthralgias and neck stiffness.  Skin: Negative.   Neurological: Negative.   Hematological: Negative.   Psychiatric/Behavioral: Negative.     Blood pressure 126/72, pulse 64, temperature 98.1 F (36.7 C), temperature source Oral, resp. rate 20, height 5\' 7"  (1.702 m), weight 72.6 kg, last menstrual period 09/30/2018, SpO2 100 %. Physical Exam  Constitutional: She is oriented to person, place, and time. She appears well-developed and well-nourished. She appears distressed.  HENT:  Head: Normocephalic and atraumatic.  Eyes: Pupils are equal, round, and reactive to light. Conjunctivae and EOM are normal.  Neck: No thyromegaly present.  Cardiovascular: Normal rate, regular rhythm and normal heart sounds.  Respiratory: Effort normal and breath sounds normal. No respiratory distress.  GI:  Declined abdominal exam  Genitourinary:    Genitourinary Comments: Deferred   Musculoskeletal:         General: No tenderness or edema. Normal range of motion.     Cervical back: Normal range of motion and neck supple.  Neurological: She is alert and oriented to person, place, and time.  Skin: Skin is warm and dry. No rash noted. She is not diaphoretic. No pallor.  Psychiatric: She has a normal mood and affect. Thought content normal.    Results for orders placed or performed during the hospital encounter of 03/30/19 (from the past 48 hour(s))  CBC with Differential     Status: Abnormal   Collection Time: 03/30/19  9:47 AM  Result Value Ref Range   WBC 7.1 4.0 - 10.5 K/uL   RBC 6.47 (H) 3.87 - 5.11 MIL/uL   Hemoglobin 15.0 12.0 - 15.0 g/dL   HCT 48.2 (H) 36.0 - 46.0 %   MCV 74.5 (L) 80.0 - 100.0 fL   MCH 23.2 (L) 26.0 - 34.0 pg   MCHC 31.1 30.0 - 36.0 g/dL  RDW 20.1 (H) 11.5 - 15.5 %   Platelets 368 150 - 400 K/uL   nRBC 0.0 0.0 - 0.2 %   Neutrophils Relative % 68 %   Neutro Abs 4.8 1.7 - 7.7 K/uL   Lymphocytes Relative 22 %   Lymphs Abs 1.5 0.7 - 4.0 K/uL   Monocytes Relative 9 %   Monocytes Absolute 0.7 0.1 - 1.0 K/uL   Eosinophils Relative 1 %   Eosinophils Absolute 0.1 0.0 - 0.5 K/uL   Basophils Relative 0 %   Basophils Absolute 0.0 0.0 - 0.1 K/uL   Immature Granulocytes 0 %   Abs Immature Granulocytes 0.02 0.00 - 0.07 K/uL    Comment: Performed at [facility], [address] [place], [place] [address]  Comprehensive metabolic panel     Status: Abnormal   Collection Time: 03/30/19  9:47 AM  Result Value Ref Range   Sodium 136 135 - 145 mmol/L   Potassium 4.2 3.5 - 5.1 mmol/L    Comment: HEMOLYSIS AT THIS LEVEL MAY AFFECT RESULT   Chloride 95 (L) 98 - 111 mmol/L   CO2 26 22 - 32 mmol/L   Glucose, Bld 136 (H) 70 - 99 mg/dL    Comment: Glucose reference range applies only to samples taken after fasting for at least 8 hours.   BUN 16 6 - 20 mg/dL   Creatinine, Ser 0.87 0.44 - 1.00 mg/dL   Calcium 9.7 8.9 - 10.3 mg/dL   Total Protein 8.6 (H) 6.5 - 8.1  g/dL   Albumin 4.1 3.5 - 5.0 g/dL   AST 25 15 - 41 U/L   ALT 18 0 - 44 U/L   Alkaline Phosphatase 76 38 - 126 U/L   Total Bilirubin 1.0 0.3 - 1.2 mg/dL   GFR calc non Af Amer >60 >60 mL/min   GFR calc Af Amer >60 >60 mL/min   Anion gap 15 5 - 15    Comment: Performed at [facility], [address] [place], [place] [address]  hCG, quantitative, pregnancy     Status: None   Collection Time: 03/30/19  9:47 AM  Result Value Ref Range   hCG, Beta Chain, Quant, S <1 <5 mIU/mL    Comment:          GEST. AGE      CONC.  (mIU/mL)   <=1 WEEK        5 - 50     2 WEEKS       50 - 500     3 WEEKS       100 - 10,000     4 WEEKS     1,000 - 30,000     5 WEEKS     3,500 - 115,000   6-8 WEEKS     12,000 - 270,000    12 WEEKS     15,000 - 220,000        FEMALE AND NON-PREGNANT FEMALE:     LESS THAN 5 mIU/mL Performed at [facility], [address] [place], [place] [address]   Lipase, blood     Status: None   Collection Time: 03/30/19  9:47 AM  Result Value Ref Range   Lipase 22 11 - 51 U/L    Comment: Performed at [facility], [place], [place] [address]  Troponin I (High Sensitivity)     Status: None   Collection Time: 03/30/19  9:47 AM  Result Value Ref Range  Troponin I (High Sensitivity) 5 <18 ng/L    Comment: (NOTE) Elevated high sensitivity troponin I (hsTnI) values and significant  changes across serial measurements may suggest ACS but many other  chronic and acute conditions are known to elevate hsTnI results.  Refer to the "Links" section for chest pain algorithms and additional  guidance. Performed at [facility], [address] [place], [place] [address]   CK     Status: None   Collection Time: 03/30/19  9:47 AM  Result Value Ref Range   Total CK 43 38 - 234 U/L    Comment: Performed at [facility], [address] [place], [place] [address]  Respiratory Panel by RT PCR (Flu A&B, Covid) -  Nasopharyngeal Swab     Status: None   Collection Time: 03/30/19  2:05 PM   Specimen: Nasopharyngeal Swab  Result Value Ref Range   SARS Coronavirus 2 by RT PCR NEGATIVE NEGATIVE    Comment: (NOTE) SARS-CoV-2 target nucleic acids are NOT DETECTED. The SARS-CoV-2 RNA is generally detectable in upper respiratoy specimens during the acute phase of infection. The lowest concentration of SARS-CoV-2 viral copies this assay can detect is 131 copies/mL. A negative result does not preclude SARS-Cov-2 infection and should not be used as the sole basis for treatment or other patient management decisions. A negative result may occur with  improper specimen collection/handling, submission of specimen other than nasopharyngeal swab, presence of viral mutation(s) within the areas targeted by this assay, and inadequate number of viral copies (<131 copies/mL). A negative result must be combined with clinical observations, patient history, and epidemiological information. The expected result is Negative. Fact Sheet for Patients:  PinkCheek.be Fact Sheet for Healthcare Providers:  GravelBags.it This test is not yet ap proved or cleared by the [place] FDA and  has been authorized for detection and/or diagnosis of SARS-CoV-2 by FDA under an Emergency Use Authorization (EUA). This EUA will remain  in effect (meaning this test can be used) for the duration of the COVID-19 declaration under Section 564(b)(1) of the Act, 21 U.S.C. section 360bbb-3(b)(1), unless the authorization is terminated or revoked sooner.    Influenza A by PCR NEGATIVE NEGATIVE   Influenza B by PCR NEGATIVE NEGATIVE    Comment: (NOTE) The Xpert Xpress SARS-CoV-2/FLU/RSV assay is intended as an aid in  the diagnosis of influenza from Nasopharyngeal swab specimens and  should not be used as a sole basis for treatment. Nasal washings and  aspirates are unacceptable for  Xpert Xpress SARS-CoV-2/FLU/RSV  testing. Fact Sheet for Patients: PinkCheek.be Fact Sheet for Healthcare Providers: GravelBags.it This test is not yet approved or cleared by the [place] FDA and  has been authorized for detection and/or diagnosis of SARS-CoV-2 by  FDA under an Emergency Use Authorization (EUA). This EUA will remain  in effect (meaning this test can be used) for the duration of the  Covid-19 declaration under Section 564(b)(1) of the Act, 21  U.S.C. section 360bbb-3(b)(1), unless the authorization is  terminated or revoked. Performed at [facility], [address] [place], [place] [address]   Ethanol     Status: None   Collection Time: 03/30/19  5:13 PM  Result Value Ref Range   Alcohol, Ethyl (B) <10 <10 mg/dL    Comment: (NOTE) Lowest detectable limit for serum alcohol is 10 mg/dL. For medical purposes only. Performed at [facility], [address] [place], [place] [address]   Troponin I (High Sensitivity)  Status: None   Collection Time: 03/30/19  5:13 PM  Result Value Ref Range   Troponin I (High Sensitivity) 6 <18 ng/L    Comment: (NOTE) Elevated high sensitivity troponin I (hsTnI) values and significant  changes across serial measurements may suggest ACS but many other  chronic and acute conditions are known to elevate hsTnI results.  Refer to the "Links" section for chest pain algorithms and additional  guidance. Performed at [facility], [address] [place], [place] [address]     CT Head Wo Contrast  Result Date: 03/30/2019 CLINICAL DATA:  Trauma, headache EXAM: CT HEAD WITHOUT CONTRAST TECHNIQUE: Contiguous axial images were obtained from the base of the skull through the vertex without intravenous contrast. COMPARISON:  None. FINDINGS: Brain: No evidence of acute infarction, hemorrhage, hydrocephalus, extra-axial collection or mass lesion/mass  effect. Vascular: No hyperdense vessel or unexpected calcification. Skull: No osseous abnormality. Sinuses/Orbits: Visualized paranasal sinuses are clear. Visualized mastoid sinuses are clear. Visualized orbits demonstrate no focal abnormality. Other: None IMPRESSION: No acute intracranial pathology. Electronically Signed   By: Kathreen Devoid   On: 03/30/2019 11:50   CT Angio Chest PE W and/or Wo Contrast  Result Date: 03/30/2019 CLINICAL DATA:  Patient with diffuse pain. Prior history of pancreatitis. Vomiting. EXAM: CT ANGIOGRAPHY CHEST CT ABDOMEN AND PELVIS WITH CONTRAST TECHNIQUE: Multidetector CT imaging of the chest was performed using the standard protocol during bolus administration of intravenous contrast. Multiplanar CT image reconstructions and MIPs were obtained to evaluate the vascular anatomy. Multidetector CT imaging of the abdomen and pelvis was performed using the standard protocol during bolus administration of intravenous contrast. CONTRAST:  47mL OMNIPAQUE IOHEXOL 350 MG/ML SOLN COMPARISON:  CT abdomen pelvis 11/18/2018 FINDINGS: CTA CHEST FINDINGS Cardiovascular: Normal heart size. Aorta and main pulmonary artery normal in caliber. Adequate opacification of the pulmonary arterial system. No intraluminal filling defects identified to suggest acute pulmonary embolus. Mediastinum/Nodes: No enlarged axillary, mediastinal or hilar lymphadenopathy. Normal appearance of the esophagus. Lungs/Pleura: Central airways are patent. Dependent atelectasis. 5 mm right lower lobe nodule (image 53; series 6). 4 mm left lower lobe nodule (image 63; series 6). No pleural effusion or pneumothorax. Musculoskeletal: No aggressive or acute appearing osseous lesions. Review of the MIP images confirms the above findings. CT ABDOMEN and PELVIS FINDINGS Hepatobiliary: Liver is normal in size and contour. No focal lesion identified. Gallbladder is unremarkable. Cavernous transformation of the portal vein. Pancreas:  Multiple calcifications within the pancreatic head/uncinate process compatible with chronic calcific pancreatitis. Similar low-attenuation region within the pancreatic head, previously evaluated with MRI representing a small cystic lesion. Spleen: Unremarkable Adrenals/Urinary Tract: Normal adrenal glands. Kidneys enhance symmetrically with contrast. No hydronephrosis. Urinary bladder is unremarkable. Stomach/Bowel: Normal morphology of the stomach. No evidence for small bowel obstruction. No free intraperitoneal air. Normal appendix. Vascular/Lymphatic: Normal caliber abdominal aorta. No retroperitoneal lymphadenopathy. Multiple collateral vessels within the left upper quadrant. Reproductive: Prior hysterectomy. Adnexal structures unremarkable. Moderate volume free fluid in the pelvis. Other: None. Musculoskeletal: No aggressive or acute appearing osseous lesions. Review of the MIP images confirms the above findings. IMPRESSION: 1. No evidence for acute pulmonary embolus. 2. Findings compatible with chronic calcific pancreatitis. 3. Cavernous transformation of the portal vein. 4. Nonspecific moderate volume free fluid in the pelvis. 5. Pulmonary nodules measuring up to 5 mm. No follow-up needed if patient is low-risk (and has no known or suspected primary neoplasm). Non-contrast chest CT can be considered in 12 months if patient is high-risk. This recommendation follows  the consensus statement: Guidelines for Management of Incidental Pulmonary Nodules Detected on CT Images: From the Fleischner Society 2017; Radiology 2017; 284:228-243. Electronically Signed   By: Lovey Newcomer M.D.   On: 03/30/2019 11:55   CT ABDOMEN PELVIS W CONTRAST  Result Date: 03/30/2019 CLINICAL DATA:  Patient with diffuse pain. Prior history of pancreatitis. Vomiting. EXAM: CT ANGIOGRAPHY CHEST CT ABDOMEN AND PELVIS WITH CONTRAST TECHNIQUE: Multidetector CT imaging of the chest was performed using the standard protocol during bolus  administration of intravenous contrast. Multiplanar CT image reconstructions and MIPs were obtained to evaluate the vascular anatomy. Multidetector CT imaging of the abdomen and pelvis was performed using the standard protocol during bolus administration of intravenous contrast. CONTRAST:  49mL OMNIPAQUE IOHEXOL 350 MG/ML SOLN COMPARISON:  CT abdomen pelvis 11/18/2018 FINDINGS: CTA CHEST FINDINGS Cardiovascular: Normal heart size. Aorta and main pulmonary artery normal in caliber. Adequate opacification of the pulmonary arterial system. No intraluminal filling defects identified to suggest acute pulmonary embolus. Mediastinum/Nodes: No enlarged axillary, mediastinal or hilar lymphadenopathy. Normal appearance of the esophagus. Lungs/Pleura: Central airways are patent. Dependent atelectasis. 5 mm right lower lobe nodule (image 53; series 6). 4 mm left lower lobe nodule (image 63; series 6). No pleural effusion or pneumothorax. Musculoskeletal: No aggressive or acute appearing osseous lesions. Review of the MIP images confirms the above findings. CT ABDOMEN and PELVIS FINDINGS Hepatobiliary: Liver is normal in size and contour. No focal lesion identified. Gallbladder is unremarkable. Cavernous transformation of the portal vein. Pancreas: Multiple calcifications within the pancreatic head/uncinate process compatible with chronic calcific pancreatitis. Similar low-attenuation region within the pancreatic head, previously evaluated with MRI representing a small cystic lesion. Spleen: Unremarkable Adrenals/Urinary Tract: Normal adrenal glands. Kidneys enhance symmetrically with contrast. No hydronephrosis. Urinary bladder is unremarkable. Stomach/Bowel: Normal morphology of the stomach. No evidence for small bowel obstruction. No free intraperitoneal air. Normal appendix. Vascular/Lymphatic: Normal caliber abdominal aorta. No retroperitoneal lymphadenopathy. Multiple collateral vessels within the left upper quadrant.  Reproductive: Prior hysterectomy. Adnexal structures unremarkable. Moderate volume free fluid in the pelvis. Other: None. Musculoskeletal: No aggressive or acute appearing osseous lesions. Review of the MIP images confirms the above findings. IMPRESSION: 1. No evidence for acute pulmonary embolus. 2. Findings compatible with chronic calcific pancreatitis. 3. Cavernous transformation of the portal vein. 4. Nonspecific moderate volume free fluid in the pelvis. 5. Pulmonary nodules measuring up to 5 mm. No follow-up needed if patient is low-risk (and has no known or suspected primary neoplasm). Non-contrast chest CT can be considered in 12 months if patient is high-risk. This recommendation follows the consensus statement: Guidelines for Management of Incidental Pulmonary Nodules Detected on CT Images: From the Fleischner Society 2017; Radiology 2017; 284:228-243. Electronically Signed   By: Lovey Newcomer M.D.   On: 03/30/2019 11:55   US PELVIC COMPLETE W TRANSVAGINAL AND TORSION R/O  Result Date: 03/30/2019 CLINICAL DATA:  Free fluid in the pelvis.  Prior hysterectomy. EXAM: TRANSABDOMINAL AND TRANSVAGINAL ULTRASOUND OF PELVIS DOPPLER ULTRASOUND OF OVARIES TECHNIQUE: Both transabdominal and transvaginal ultrasound examinations of the pelvis were performed. Transabdominal technique was performed for global imaging of the pelvis including uterus, ovaries, adnexal regions, and pelvic cul-de-sac. It was necessary to proceed with endovaginal exam following the transabdominal exam to visualize the adnexal structures. Color and duplex Doppler ultrasound was utilized to evaluate blood flow to the ovaries. COMPARISON:  CT abdomen pelvis earlier same day FINDINGS: Uterus Surgically absent Right ovary Measurements: 2.1 x 1.7 x 2.6 cm = volume: 4.7 mL. Normal  appearance/no adnexal mass. Left ovary Measurements: 3.3 x 2.2 x 2.7 cm = volume: 10.4 mL. Normal appearance/no adnexal mass. Pulsed Doppler evaluation of both ovaries  demonstrates normal low-resistance arterial and venous waveforms. Other findings Moderate volume free fluid in the pelvis. IMPRESSION: Moderate volume complex fluid in the pelvis. Normal appearance of the right and left ovaries. Electronically Signed   By: Lovey Newcomer M.D.   On: 03/30/2019 13:43    Assessment/Plan: 1. Abdominal pain with free fluid in pelvis - Unclear cause at this time.  Patient with several GI issues, including chronic hepatitis, liver abscess, GERD, and portal vein thrombosis).  Neither ultrasound nor CT note any specific pelvic/reproductive abnormalities. Remote possibility could be that patient had an ovarian cyst that has ruptured leading to free fluid in the pelvis (however no remnants of a cyst noted on either scan). Would follow CBC trend (although I suspect there will be a decrease in H/H also due to hydration overnight as patient has been vomiting for several days and is most likely hemo-concentrated). Free fluid could be caused by other inflammatory process ongoing in the abdomen (although bowel and appendix also appeared normal on imaging). Peritonitis likely causing pain due to free fluid collection. Continue current pain management and anti-emetics. Would consider GI consult if no improvement in symptoms. May also consider CT-guided drainage if no improvement.  - Added Phenergan to anti-emetic regimen as patient still vomiting despite use of Zofran. Also changed Protonix from PO to IV until patient better able to tolerate.  - Changed to NPO status for tonight. Will reassess in the monring.  - UA ordered in ER, but not previously collected. Will collect now.  - UDS also ordered previously but not collected.  Patient has received Dilaudid in the ER as well as on the unit. Will monitor if patient is positive for any other substances.   2. Other comorbidities to be managed by primary team.   Will follow up in a.m.   Rubie Maid, MD [facility] 03/30/2019

## 2019-03-30 NOTE — ED Notes (Signed)
This Rn was called and informed that pt had walked out with 2 IVs in place as well as cardiac monitor cords. This RN and Anda Kraft, RN went outside to find pt standing in the parking lot smoking. Pt was informed that she cannot be outside of the building with IVs in place. Pt stated "I know". Pt informed that she would need to come back inside. Pt asked if she could smoke in the room. Pt was informed that she could not. Pt was escorted back into the building by Anda Kraft, Therapist, sports

## 2019-03-30 NOTE — ED Notes (Signed)
Per Dr. Jari Pigg do not draw second troponin.

## 2019-03-30 NOTE — Progress Notes (Addendum)
Report received from ER RN, Celso Sickle. Patient to room at 1715.

## 2019-03-30 NOTE — ED Triage Notes (Addendum)
Pt arrives [facility] for vomiting since Wednesday. EMS reports pt walked out of house and slept on the way to the ER. Pt requesting pain medication multiple times while trying to be assessed. EMS offered nausea medication but pt declined. EMS reports seeing multiple 40oz bottels around house, pt states she hasn't drank since last mothers day. Hx pancreatitis. Pt c/o pain "everywhere." pt admits to smoking weed.

## 2019-03-30 NOTE — ED Notes (Signed)
Pt transported to room 348

## 2019-03-30 NOTE — ED Notes (Signed)
Manuela Schwartz RN found this RN and informed that pt had walked out to the lobby and wouldn't stop and come back to room for [employer]. Levada Dy, Agricultural consultant, this RN, and Roxine Caddy, RN student walked out to parking lot. Pt saw [place] approaching and turned away and sat down on curb, found smoking a cigarette. Pt informed that this is a smoke free campus and informed to put cigarette out. Female visitor with pt. Pt asked if she could smoke in room. Pt informed no she cant. Offered a nicotine patch. Pt did not answer. Pt got up and walked back into lobby with female visitor and this RN. Pt escorted back to room and hooked back up to cardiac monitor. Pt given cup of water.

## 2019-03-31 DIAGNOSIS — I10 Essential (primary) hypertension: Secondary | ICD-10-CM | POA: Diagnosis not present

## 2019-03-31 DIAGNOSIS — R109 Unspecified abdominal pain: Secondary | ICD-10-CM | POA: Diagnosis not present

## 2019-03-31 DIAGNOSIS — R188 Other ascites: Secondary | ICD-10-CM | POA: Diagnosis not present

## 2019-03-31 DIAGNOSIS — K219 Gastro-esophageal reflux disease without esophagitis: Secondary | ICD-10-CM | POA: Diagnosis not present

## 2019-03-31 DIAGNOSIS — R825 Elevated urine levels of drugs, medicaments and biological substances: Secondary | ICD-10-CM

## 2019-03-31 LAB — URINALYSIS, COMPLETE (UACMP) WITH MICROSCOPIC
Bacteria, UA: NONE SEEN
Bilirubin Urine: NEGATIVE
Glucose, UA: NEGATIVE mg/dL
Hgb urine dipstick: NEGATIVE
Ketones, ur: NEGATIVE mg/dL
Leukocytes,Ua: NEGATIVE
Nitrite: NEGATIVE
Protein, ur: NEGATIVE mg/dL
Specific Gravity, Urine: 1.038 — ABNORMAL HIGH (ref 1.005–1.030)
pH: 5 (ref 5.0–8.0)

## 2019-03-31 LAB — URINE DRUG SCREEN, QUALITATIVE (ARMC ONLY)
Amphetamines, Ur Screen: NOT DETECTED
Barbiturates, Ur Screen: NOT DETECTED
Benzodiazepine, Ur Scrn: POSITIVE — AB
Cannabinoid 50 Ng, Ur ~~LOC~~: POSITIVE — AB
Cocaine Metabolite,Ur ~~LOC~~: NOT DETECTED
MDMA (Ecstasy)Ur Screen: NOT DETECTED
Methadone Scn, Ur: NOT DETECTED
Opiate, Ur Screen: POSITIVE — AB
Phencyclidine (PCP) Ur S: NOT DETECTED
Tricyclic, Ur Screen: NOT DETECTED

## 2019-03-31 NOTE — Discharge Summary (Signed)
Physician Discharge Summary  Amy Wolfe [id number] DOB: [birth year] DOA: 03/30/2019  PCP: Hubbard Hartshorn, FNP  Admit date: 03/30/2019 Discharge date: 03/31/2019  Admitted From: home Disposition: pt left AMA  Recommendations for Outpatient Follow-up:  Pt left AMA   Home Health: no Equipment/Devices: n/a  Discharge Condition: guarded CODE STATUS: full  Diet recommendation: regular  Brief/Interim Summary: HPI was taken from Dr. Blaine Hamper: Amy Wolfe is a 44 y.o. female with medical history significant of hypertension, GERD, anxiety, chronic pancreatitis, liver abscess, CAD, uterine fibroids, portal vein thrombosis, tobacco abuse, who presents with abdominal pain.  Patient has been having abdominal pain in the past 4 days, which has been progressively worsening.  She states that she has diffused abdominal pain, which is constant, sharp, severe, nonradiating.  She has nausea, and vomited more than 10 times today, with greenish colored vomitus.  No diarrhea.  No fever or chills.  Patient has chronic mild dry cough, no shortness breath, chest pain.  Patient states that she passed out two or three times, but does not want give detailed information.  She does not have unilateral weakness, numbness or tingling in extremities.  No facial droop or slurred speech.  Patient denies drinking alcohol.  She states that she smokes weeds.  Patient states that she is out of her Xarelto for more than 2 weeks.  ED Course: pt was found to have WBC 7.1, troponin 5, lipase 22, negative pregnancy test, pending alcohol level, CK 43, pending COVID-19 PCR, electrolytes renal function okay, temperature normal, blood pressure 111/67 heart rate 65, RR 13, oxygen saturation 92% on room air. CT head is negative for acute intracranial abnormalities. CT angiogram is negative for PE.  # CTA and CT-abd/pelvis showed: 1. No evidence for acute pulmonary embolus. 2. Findings compatible with chronic calcific pancreatitis. 3.  Cavernous transformation of the portal vein. 4. Nonspecific moderate volume free fluid in the pelvis. 5. Pulmonary nodules measuring up to 5 mm.   # Pelvis US showed: 1. Moderate volume complex fluid in the pelvis. 2. Normal appearance of the right and left ovaries.  Hospital Course from Dr. Lenise Herald 03/31/19: I got a page this morning from the pt's nurse stating that the pt was refusing everything and wanted to be d/c. Pt decided to leave AMA before I was able to see the pt. Evidently, pt's abd pain had much improved as per OBGYN note.   Discharge Diagnoses:  Principal Problem:   Abdominal pain Active Problems:   Essential hypertension   Pancreatitis, chronic (HCC)   Portal vein thrombosis   GERD (gastroesophageal reflux disease)   Tobacco abuse   Syncope    Discharge Instructions  Pt left AMA    No Known Allergies  Consultations: OGBYN  Procedures/Studies: CT Head Wo Contrast  Result Date: 03/30/2019 CLINICAL DATA:  Trauma, headache EXAM: CT HEAD WITHOUT CONTRAST TECHNIQUE: Contiguous axial images were obtained from the base of the skull through the vertex without intravenous contrast. COMPARISON:  None. FINDINGS: Brain: No evidence of acute infarction, hemorrhage, hydrocephalus, extra-axial collection or mass lesion/mass effect. Vascular: No hyperdense vessel or unexpected calcification. Skull: No osseous abnormality. Sinuses/Orbits: Visualized paranasal sinuses are clear. Visualized mastoid sinuses are clear. Visualized orbits demonstrate no focal abnormality. Other: None IMPRESSION: No acute intracranial pathology. Electronically Signed   By: Kathreen Devoid   On: 03/30/2019 11:50   CT Angio Chest PE W and/or Wo Contrast  Result Date: 03/30/2019 CLINICAL DATA:  Patient with diffuse pain. Prior history of pancreatitis. Vomiting.  EXAM: CT ANGIOGRAPHY CHEST CT ABDOMEN AND PELVIS WITH CONTRAST TECHNIQUE: Multidetector CT imaging of the chest was performed using the standard  protocol during bolus administration of intravenous contrast. Multiplanar CT image reconstructions and MIPs were obtained to evaluate the vascular anatomy. Multidetector CT imaging of the abdomen and pelvis was performed using the standard protocol during bolus administration of intravenous contrast. CONTRAST:  22mL OMNIPAQUE IOHEXOL 350 MG/ML SOLN COMPARISON:  CT abdomen pelvis 11/18/2018 FINDINGS: CTA CHEST FINDINGS Cardiovascular: Normal heart size. Aorta and main pulmonary artery normal in caliber. Adequate opacification of the pulmonary arterial system. No intraluminal filling defects identified to suggest acute pulmonary embolus. Mediastinum/Nodes: No enlarged axillary, mediastinal or hilar lymphadenopathy. Normal appearance of the esophagus. Lungs/Pleura: Central airways are patent. Dependent atelectasis. 5 mm right lower lobe nodule (image 53; series 6). 4 mm left lower lobe nodule (image 63; series 6). No pleural effusion or pneumothorax. Musculoskeletal: No aggressive or acute appearing osseous lesions. Review of the MIP images confirms the above findings. CT ABDOMEN and PELVIS FINDINGS Hepatobiliary: Liver is normal in size and contour. No focal lesion identified. Gallbladder is unremarkable. Cavernous transformation of the portal vein. Pancreas: Multiple calcifications within the pancreatic head/uncinate process compatible with chronic calcific pancreatitis. Similar low-attenuation region within the pancreatic head, previously evaluated with MRI representing a small cystic lesion. Spleen: Unremarkable Adrenals/Urinary Tract: Normal adrenal glands. Kidneys enhance symmetrically with contrast. No hydronephrosis. Urinary bladder is unremarkable. Stomach/Bowel: Normal morphology of the stomach. No evidence for small bowel obstruction. No free intraperitoneal air. Normal appendix. Vascular/Lymphatic: Normal caliber abdominal aorta. No retroperitoneal lymphadenopathy. Multiple collateral vessels within the  left upper quadrant. Reproductive: Prior hysterectomy. Adnexal structures unremarkable. Moderate volume free fluid in the pelvis. Other: None. Musculoskeletal: No aggressive or acute appearing osseous lesions. Review of the MIP images confirms the above findings. IMPRESSION: 1. No evidence for acute pulmonary embolus. 2. Findings compatible with chronic calcific pancreatitis. 3. Cavernous transformation of the portal vein. 4. Nonspecific moderate volume free fluid in the pelvis. 5. Pulmonary nodules measuring up to 5 mm. No follow-up needed if patient is low-risk (and has no known or suspected primary neoplasm). Non-contrast chest CT can be considered in 12 months if patient is high-risk. This recommendation follows the consensus statement: Guidelines for Management of Incidental Pulmonary Nodules Detected on CT Images: From the Fleischner Society 2017; Radiology 2017; 284:228-243. Electronically Signed   By: Lovey Newcomer M.D.   On: 03/30/2019 11:55   CT ABDOMEN PELVIS W CONTRAST  Result Date: 03/30/2019 CLINICAL DATA:  Patient with diffuse pain. Prior history of pancreatitis. Vomiting. EXAM: CT ANGIOGRAPHY CHEST CT ABDOMEN AND PELVIS WITH CONTRAST TECHNIQUE: Multidetector CT imaging of the chest was performed using the standard protocol during bolus administration of intravenous contrast. Multiplanar CT image reconstructions and MIPs were obtained to evaluate the vascular anatomy. Multidetector CT imaging of the abdomen and pelvis was performed using the standard protocol during bolus administration of intravenous contrast. CONTRAST:  2mL OMNIPAQUE IOHEXOL 350 MG/ML SOLN COMPARISON:  CT abdomen pelvis 11/18/2018 FINDINGS: CTA CHEST FINDINGS Cardiovascular: Normal heart size. Aorta and main pulmonary artery normal in caliber. Adequate opacification of the pulmonary arterial system. No intraluminal filling defects identified to suggest acute pulmonary embolus. Mediastinum/Nodes: No enlarged axillary, mediastinal  or hilar lymphadenopathy. Normal appearance of the esophagus. Lungs/Pleura: Central airways are patent. Dependent atelectasis. 5 mm right lower lobe nodule (image 53; series 6). 4 mm left lower lobe nodule (image 63; series 6). No pleural effusion or pneumothorax. Musculoskeletal: No aggressive or  acute appearing osseous lesions. Review of the MIP images confirms the above findings. CT ABDOMEN and PELVIS FINDINGS Hepatobiliary: Liver is normal in size and contour. No focal lesion identified. Gallbladder is unremarkable. Cavernous transformation of the portal vein. Pancreas: Multiple calcifications within the pancreatic head/uncinate process compatible with chronic calcific pancreatitis. Similar low-attenuation region within the pancreatic head, previously evaluated with MRI representing a small cystic lesion. Spleen: Unremarkable Adrenals/Urinary Tract: Normal adrenal glands. Kidneys enhance symmetrically with contrast. No hydronephrosis. Urinary bladder is unremarkable. Stomach/Bowel: Normal morphology of the stomach. No evidence for small bowel obstruction. No free intraperitoneal air. Normal appendix. Vascular/Lymphatic: Normal caliber abdominal aorta. No retroperitoneal lymphadenopathy. Multiple collateral vessels within the left upper quadrant. Reproductive: Prior hysterectomy. Adnexal structures unremarkable. Moderate volume free fluid in the pelvis. Other: None. Musculoskeletal: No aggressive or acute appearing osseous lesions. Review of the MIP images confirms the above findings. IMPRESSION: 1. No evidence for acute pulmonary embolus. 2. Findings compatible with chronic calcific pancreatitis. 3. Cavernous transformation of the portal vein. 4. Nonspecific moderate volume free fluid in the pelvis. 5. Pulmonary nodules measuring up to 5 mm. No follow-up needed if patient is low-risk (and has no known or suspected primary neoplasm). Non-contrast chest CT can be considered in 12 months if patient is high-risk.  This recommendation follows the consensus statement: Guidelines for Management of Incidental Pulmonary Nodules Detected on CT Images: From the Fleischner Society 2017; Radiology 2017; 284:228-243. Electronically Signed   By: Lovey Newcomer M.D.   On: 03/30/2019 11:55   US PELVIC COMPLETE W TRANSVAGINAL AND TORSION R/O  Result Date: 03/30/2019 CLINICAL DATA:  Free fluid in the pelvis.  Prior hysterectomy. EXAM: TRANSABDOMINAL AND TRANSVAGINAL ULTRASOUND OF PELVIS DOPPLER ULTRASOUND OF OVARIES TECHNIQUE: Both transabdominal and transvaginal ultrasound examinations of the pelvis were performed. Transabdominal technique was performed for global imaging of the pelvis including uterus, ovaries, adnexal regions, and pelvic cul-de-sac. It was necessary to proceed with endovaginal exam following the transabdominal exam to visualize the adnexal structures. Color and duplex Doppler ultrasound was utilized to evaluate blood flow to the ovaries. COMPARISON:  CT abdomen pelvis earlier same day FINDINGS: Uterus Surgically absent Right ovary Measurements: 2.1 x 1.7 x 2.6 cm = volume: 4.7 mL. Normal appearance/no adnexal mass. Left ovary Measurements: 3.3 x 2.2 x 2.7 cm = volume: 10.4 mL. Normal appearance/no adnexal mass. Pulsed Doppler evaluation of both ovaries demonstrates normal low-resistance arterial and venous waveforms. Other findings Moderate volume free fluid in the pelvis. IMPRESSION: Moderate volume complex fluid in the pelvis. Normal appearance of the right and left ovaries. Electronically Signed   By: Lovey Newcomer M.D.   On: 03/30/2019 13:43     Subjective:   Discharge Exam: Vitals:   03/31/19 0600 03/31/19 0700  BP:    Pulse: 65 64  Resp:    Temp:    SpO2: 95% 97%   Vitals:   03/31/19 0400 03/31/19 0500 03/31/19 0600 03/31/19 0700  BP:      Pulse: 64 82 65 64  Resp:      Temp:      TempSrc:      SpO2: 94% 98% 95% 97%  Weight:      Height:        Unable to do a physical exam as the pt left  AMA prior to me being able to see the pt    The results of significant diagnostics from this hospitalization (including imaging, microbiology, ancillary and laboratory) are listed below for reference.  Microbiology: Recent Results (from the past 240 hour(s))  Respiratory Panel by RT PCR (Flu A&B, Covid) - Nasopharyngeal Swab     Status: None   Collection Time: 03/30/19  2:05 PM   Specimen: Nasopharyngeal Swab  Result Value Ref Range Status   SARS Coronavirus 2 by RT PCR NEGATIVE NEGATIVE Final    Comment: (NOTE) SARS-CoV-2 target nucleic acids are NOT DETECTED. The SARS-CoV-2 RNA is generally detectable in upper respiratoy specimens during the acute phase of infection. The lowest concentration of SARS-CoV-2 viral copies this assay can detect is 131 copies/mL. A negative result does not preclude SARS-Cov-2 infection and should not be used as the sole basis for treatment or other patient management decisions. A negative result may occur with  improper specimen collection/handling, submission of specimen other than nasopharyngeal swab, presence of viral mutation(s) within the areas targeted by this assay, and inadequate number of viral copies (<131 copies/mL). A negative result must be combined with clinical observations, patient history, and epidemiological information. The expected result is Negative. Fact Sheet for Patients:  PinkCheek.be Fact Sheet for Healthcare Providers:  GravelBags.it This test is not yet ap proved or cleared by the [place] FDA and  has been authorized for detection and/or diagnosis of SARS-CoV-2 by FDA under an Emergency Use Authorization (EUA). This EUA will remain  in effect (meaning this test can be used) for the duration of the COVID-19 declaration under Section 564(b)(1) of the Act, 21 U.S.C. section 360bbb-3(b)(1), unless the authorization is terminated or revoked sooner.     Influenza A by PCR NEGATIVE NEGATIVE Final   Influenza B by PCR NEGATIVE NEGATIVE Final    Comment: (NOTE) The Xpert Xpress SARS-CoV-2/FLU/RSV assay is intended as an aid in  the diagnosis of influenza from Nasopharyngeal swab specimens and  should not be used as a sole basis for treatment. Nasal washings and  aspirates are unacceptable for Xpert Xpress SARS-CoV-2/FLU/RSV  testing. Fact Sheet for Patients: PinkCheek.be Fact Sheet for Healthcare Providers: GravelBags.it This test is not yet approved or cleared by the [place] FDA and  has been authorized for detection and/or diagnosis of SARS-CoV-2 by  FDA under an Emergency Use Authorization (EUA). This EUA will remain  in effect (meaning this test can be used) for the duration of the  Covid-19 declaration under Section 564(b)(1) of the Act, 21  U.S.C. section 360bbb-3(b)(1), unless the authorization is  terminated or revoked. Performed at [facility], [address] [place], [place] [address]      Labs: BNP (last 3 results) No results for input(s): BNP in the last 8760 hours. Basic Metabolic Panel: Recent Labs  Lab 03/30/19 0947  NA 136  K 4.2  CL 95*  CO2 26  GLUCOSE 136*  BUN 16  CREATININE 0.87  CALCIUM 9.7   Liver Function Tests: Recent Labs  Lab 03/30/19 0947  AST 25  ALT 18  ALKPHOS 76  BILITOT 1.0  PROT 8.6*  ALBUMIN 4.1   Recent Labs  Lab 03/30/19 0947  LIPASE 22   No results for input(s): AMMONIA in the last 168 hours. CBC: Recent Labs  Lab 03/30/19 0947  WBC 7.1  NEUTROABS 4.8  HGB 15.0  HCT 48.2*  MCV 74.5*  PLT 368   Cardiac Enzymes: Recent Labs  Lab 03/30/19 0947  CKTOTAL 43   BNP: Invalid input(s): POCBNP CBG: No results for input(s): GLUCAP in the last 168 hours. D-Dimer No results for input(s): DDIMER in the last 72 hours. Hgb A1c  No results for input(s): HGBA1C in the last 72 hours. Lipid  Profile No results for input(s): CHOL, HDL, LDLCALC, TRIG, CHOLHDL, LDLDIRECT in the last 72 hours. Thyroid function studies No results for input(s): TSH, T4TOTAL, T3FREE, THYROIDAB in the last 72 hours.  Invalid input(s): FREET3 Anemia work up No results for input(s): VITAMINB12, FOLATE, FERRITIN, TIBC, IRON, RETICCTPCT in the last 72 hours. Urinalysis    Component Value Date/Time   COLORURINE YELLOW (A) 03/31/2019 0037   APPEARANCEUR CLEAR (A) 03/31/2019 0037   LABSPEC 1.038 (H) 03/31/2019 0037   PHURINE 5.0 03/31/2019 0037   GLUCOSEU NEGATIVE 03/31/2019 0037   HGBUR NEGATIVE 03/31/2019 0037   BILIRUBINUR NEGATIVE 03/31/2019 0037   KETONESUR NEGATIVE 03/31/2019 0037   PROTEINUR NEGATIVE 03/31/2019 0037   NITRITE NEGATIVE 03/31/2019 0037   LEUKOCYTESUR NEGATIVE 03/31/2019 0037   Sepsis Labs Invalid input(s): PROCALCITONIN,  WBC,  LACTICIDVEN Microbiology Recent Results (from the past 240 hour(s))  Respiratory Panel by RT PCR (Flu A&B, Covid) - Nasopharyngeal Swab     Status: None   Collection Time: 03/30/19  2:05 PM   Specimen: Nasopharyngeal Swab  Result Value Ref Range Status   SARS Coronavirus 2 by RT PCR NEGATIVE NEGATIVE Final    Comment: (NOTE) SARS-CoV-2 target nucleic acids are NOT DETECTED. The SARS-CoV-2 RNA is generally detectable in upper respiratoy specimens during the acute phase of infection. The lowest concentration of SARS-CoV-2 viral copies this assay can detect is 131 copies/mL. A negative result does not preclude SARS-Cov-2 infection and should not be used as the sole basis for treatment or other patient management decisions. A negative result may occur with  improper specimen collection/handling, submission of specimen other than nasopharyngeal swab, presence of viral mutation(s) within the areas targeted by this assay, and inadequate number of viral copies (<131 copies/mL). A negative result must be combined with clinical observations, patient  history, and epidemiological information. The expected result is Negative. Fact Sheet for Patients:  PinkCheek.be Fact Sheet for Healthcare Providers:  GravelBags.it This test is not yet ap proved or cleared by the [place] FDA and  has been authorized for detection and/or diagnosis of SARS-CoV-2 by FDA under an Emergency Use Authorization (EUA). This EUA will remain  in effect (meaning this test can be used) for the duration of the COVID-19 declaration under Section 564(b)(1) of the Act, 21 U.S.C. section 360bbb-3(b)(1), unless the authorization is terminated or revoked sooner.    Influenza A by PCR NEGATIVE NEGATIVE Final   Influenza B by PCR NEGATIVE NEGATIVE Final    Comment: (NOTE) The Xpert Xpress SARS-CoV-2/FLU/RSV assay is intended as an aid in  the diagnosis of influenza from Nasopharyngeal swab specimens and  should not be used as a sole basis for treatment. Nasal washings and  aspirates are unacceptable for Xpert Xpress SARS-CoV-2/FLU/RSV  testing. Fact Sheet for Patients: PinkCheek.be Fact Sheet for Healthcare Providers: GravelBags.it This test is not yet approved or cleared by the [place] FDA and  has been authorized for detection and/or diagnosis of SARS-CoV-2 by  FDA under an Emergency Use Authorization (EUA). This EUA will remain  in effect (meaning this test can be used) for the duration of the  Covid-19 declaration under Section 564(b)(1) of the Act, 21  U.S.C. section 360bbb-3(b)(1), unless the authorization is  terminated or revoked. Performed at [facility], [address] [place], [place] [address]      Time coordinating discharge: Over 30 minutes  SIGNED:   Wyvonnia Dusky, MD  [facility] 03/31/2019, 11:25 AM Pager   If 7PM-7AM, please contact night-coverage [email or web address]

## 2019-03-31 NOTE — Progress Notes (Signed)
Pt. Refused A.M. Lab draw. States she wants to,"sleep." She also said I just want to be discharged. Will Notify Provider.

## 2019-03-31 NOTE — Progress Notes (Addendum)
Hospital Day # 2, admission for severe abdominal pain, free fluid in the pelvis.   She has a PMH significant for  hypertension, GERD, anxiety, chronic pancreatitis, liver abscess, CAD, portal vein thrombosis, tobacco abuse  Subjective: no complaints and up ad lib. Patient notes that she desires to be discharged. Ambulating in room getting dressed. Desires to know name of PCP as she is out of her BP medications. Has pulled her IV out.   Objective: Temp:  [98 F (36.7 C)-98.9 F (37.2 C)] 98.3 F (36.8 C) (03/29 0308) Pulse Rate:  [54-99] 64 (03/29 0700) Resp:  [8-26] 18 (03/29 0308) BP: (90-131)/(47-99) 99/64 (03/29 0311) SpO2:  [94 %-100 %] 97 % (03/29 0700) Weight:  [72.6 kg] 72.6 kg (03/28 0939)  Physical Exam:  General: alert and no distress  Declined exam  Results for orders placed or performed during the hospital encounter of 03/30/19  Respiratory Panel by RT PCR (Flu A&B, Covid) - Nasopharyngeal Swab   Specimen: Nasopharyngeal Swab  Result Value Ref Range   SARS Coronavirus 2 by RT PCR NEGATIVE NEGATIVE   Influenza A by PCR NEGATIVE NEGATIVE   Influenza B by PCR NEGATIVE NEGATIVE  CBC with Differential  Result Value Ref Range   WBC 7.1 4.0 - 10.5 K/uL   RBC 6.47 (H) 3.87 - 5.11 MIL/uL   Hemoglobin 15.0 12.0 - 15.0 g/dL   HCT 48.2 (H) 36.0 - 46.0 %   MCV 74.5 (L) 80.0 - 100.0 fL   MCH 23.2 (L) 26.0 - 34.0 pg   MCHC 31.1 30.0 - 36.0 g/dL   RDW 20.1 (H) 11.5 - 15.5 %   Platelets 368 150 - 400 K/uL   nRBC 0.0 0.0 - 0.2 %   Neutrophils Relative % 68 %   Neutro Abs 4.8 1.7 - 7.7 K/uL   Lymphocytes Relative 22 %   Lymphs Abs 1.5 0.7 - 4.0 K/uL   Monocytes Relative 9 %   Monocytes Absolute 0.7 0.1 - 1.0 K/uL   Eosinophils Relative 1 %   Eosinophils Absolute 0.1 0.0 - 0.5 K/uL   Basophils Relative 0 %   Basophils Absolute 0.0 0.0 - 0.1 K/uL   Immature Granulocytes 0 %   Abs Immature Granulocytes 0.02 0.00 - 0.07 K/uL  Comprehensive metabolic panel  Result Value Ref  Range   Sodium 136 135 - 145 mmol/L   Potassium 4.2 3.5 - 5.1 mmol/L   Chloride 95 (L) 98 - 111 mmol/L   CO2 26 22 - 32 mmol/L   Glucose, Bld 136 (H) 70 - 99 mg/dL   BUN 16 6 - 20 mg/dL   Creatinine, Ser 0.87 0.44 - 1.00 mg/dL   Calcium 9.7 8.9 - 10.3 mg/dL   Total Protein 8.6 (H) 6.5 - 8.1 g/dL   Albumin 4.1 3.5 - 5.0 g/dL   AST 25 15 - 41 U/L   ALT 18 0 - 44 U/L   Alkaline Phosphatase 76 38 - 126 U/L   Total Bilirubin 1.0 0.3 - 1.2 mg/dL   GFR calc non Af Amer >60 >60 mL/min   GFR calc Af Amer >60 >60 mL/min   Anion gap 15 5 - 15  hCG, quantitative, pregnancy  Result Value Ref Range   hCG, Beta Chain, Quant, S <1 <5 mIU/mL  Lipase, blood  Result Value Ref Range   Lipase 22 11 - 51 U/L  Urinalysis, Complete w Microscopic  Result Value Ref Range   Color, Urine YELLOW (A) YELLOW   APPearance  CLEAR (A) CLEAR   Specific Gravity, Urine 1.038 (H) 1.005 - 1.030   pH 5.0 5.0 - 8.0   Glucose, UA NEGATIVE NEGATIVE mg/dL   Hgb urine dipstick NEGATIVE NEGATIVE   Bilirubin Urine NEGATIVE NEGATIVE   Ketones, ur NEGATIVE NEGATIVE mg/dL   Protein, ur NEGATIVE NEGATIVE mg/dL   Nitrite NEGATIVE NEGATIVE   Leukocytes,Ua NEGATIVE NEGATIVE   RBC / HPF 0-5 0 - 5 RBC/hpf   WBC, UA 0-5 0 - 5 WBC/hpf   Bacteria, UA NONE SEEN NONE SEEN   Squamous Epithelial / LPF 0-5 0 - 5   Mucus PRESENT   Urine Drug Screen, Qualitative ([facility] only)  Result Value Ref Range   Tricyclic, Ur Screen NONE DETECTED NONE DETECTED   Amphetamines, Ur Screen NONE DETECTED NONE DETECTED   MDMA (Ecstasy)Ur Screen NONE DETECTED NONE DETECTED   Cocaine Metabolite,Ur [place] NONE DETECTED NONE DETECTED   Opiate, Ur Screen POSITIVE (A) NONE DETECTED   Phencyclidine (PCP) Ur S NONE DETECTED NONE DETECTED   Cannabinoid 50 Ng, Ur [place] POSITIVE (A) NONE DETECTED   Barbiturates, Ur Screen NONE DETECTED NONE DETECTED   Benzodiazepine, Ur Scrn POSITIVE (A) NONE DETECTED   Methadone Scn, Ur NONE DETECTED NONE DETECTED  CK  Result  Value Ref Range   Total CK 43 38 - 234 U/L  Ethanol  Result Value Ref Range   Alcohol, Ethyl (B) <10 <10 mg/dL  Troponin I (High Sensitivity)  Result Value Ref Range   Troponin I (High Sensitivity) 5 <18 ng/L  Troponin I (High Sensitivity)  Result Value Ref Range   Troponin I (High Sensitivity) 6 <18 ng/L     Assessment/Plan:  - Abdominal pain with free fluid in pelvis - still unkown cause. Patient now appears to be better, ambulating in room. Plans to leave AMA as her boyfriend has arrived with her belongings.  Advised patient to wait until she is seen by Primary team.  - UDS positive for opiates however received IV doses of Dilaudid in ER and on unit prior to UDS being collected. Also positive for benzos. Labs and vitals refused this morning.  - Advised patient that if she does leave AMA to f/u with PCP and can f/u in GYN clinic as needed. Given info on PCP.  - Patient signed out [place] as I typed current progress note.     Rubie Maid, MD [facility]

## 2019-03-31 NOTE — Progress Notes (Signed)
Patient called out for RN. RN entered room pt stated she wanted to be discharged or she would leave AMA. Hospitalist Malin notified and stated she would not discharge pt because she was not medically ready and that she would be up to round on patient soon. Explained to pt the MD did not think she was ready to be discharged yet and that she would be rounding on her soon. Also explained benefits of staying in the hospital and the risks of leaving AMA. Pt calmed down and agreed to wait on hospitalist to round on her so she could discuss her plan of care with the MD.   Hilbert Bible, RN

## 2019-03-31 NOTE — Progress Notes (Signed)
Pt presented to the nurses station stating she was leaving AMA. Pt removed IV herself in her room. Site inspected before leaving and skin was clean dry and intact. Went into pts room to inspect IV catheter and catheter was intact. OB at bedside rounding at this time and explained to pt the risks of leaving AMA at this time and the benefits of staying in the hospital. Pt denied any further communication and stated she still wanted to leave. AMA papers signed. Pt left ambulatory with support person.    Hilbert Bible, RN

## 2019-05-12 NOTE — Progress Notes (Signed)
Pt present for post op follow up from hysterectomy surgery. Pt c/o of pain with sex and pain she gets every month that feels like cycle cramps.

## 2019-05-13 ENCOUNTER — Ambulatory Visit (INDEPENDENT_AMBULATORY_CARE_PROVIDER_SITE_OTHER): Payer: Medicaid Other | Admitting: Obstetrics and Gynecology

## 2019-05-13 ENCOUNTER — Encounter: Payer: Self-pay | Admitting: Obstetrics and Gynecology

## 2019-05-13 ENCOUNTER — Other Ambulatory Visit: Payer: Self-pay

## 2019-05-13 VITALS — BP 120/77 | HR 82 | Ht 67.0 in | Wt 170.1 lb

## 2019-05-13 DIAGNOSIS — Z90710 Acquired absence of both cervix and uterus: Secondary | ICD-10-CM | POA: Diagnosis not present

## 2019-05-13 DIAGNOSIS — R1084 Generalized abdominal pain: Secondary | ICD-10-CM

## 2019-05-13 DIAGNOSIS — K861 Other chronic pancreatitis: Secondary | ICD-10-CM

## 2019-05-13 DIAGNOSIS — R188 Other ascites: Secondary | ICD-10-CM | POA: Diagnosis not present

## 2019-05-13 DIAGNOSIS — I10 Essential (primary) hypertension: Secondary | ICD-10-CM

## 2019-05-13 DIAGNOSIS — R112 Nausea with vomiting, unspecified: Secondary | ICD-10-CM | POA: Diagnosis not present

## 2019-05-13 MED ORDER — PANTOPRAZOLE SODIUM 40 MG PO TBEC: 40.0000 mg | DELAYED_RELEASE_TABLET | Freq: Every day | ORAL | 1 refills | Status: DC

## 2019-05-13 MED ORDER — AMLODIPINE BESYLATE 10 MG PO TABS: 10.0000 mg | ORAL_TABLET | Freq: Every day | ORAL | 1 refills | Status: DC

## 2019-05-13 NOTE — Patient Instructions (Signed)
Pelvic Pain, Female Pelvic pain is pain in your lower belly (abdomen), below your belly button and between your hips. The pain may start suddenly (be acute), keep coming back (be recurring), or last a long time (become chronic). Pelvic pain that lasts longer than 6 months is called chronic pelvic pain. There are many causes of pelvic pain. Sometimes the cause of pelvic pain is not known. Follow these instructions at home:   Take over-the-counter and prescription medicines only as told by your doctor.  Rest as told by your doctor.  Do not have sex if it hurts.  Keep a journal of your pelvic pain. Write down: ? When the pain started. ? Where the pain is located. ? What seems to make the pain better or worse, such as food or your period (menstrual cycle). ? Any symptoms you have along with the pain.  Keep all follow-up visits as told by your doctor. This is important. Contact a doctor if:  Medicine does not help your pain.  Your pain comes back.  You have new symptoms.  You have unusual discharge or bleeding from your vagina.  You have a fever or chills.  You are having trouble pooping (constipation).  You have blood in your pee (urine) or poop (stool).  Your pee smells bad.  You feel weak or light-headed. Get help right away if:  You have sudden pain that is very bad.  Your pain keeps getting worse.  You have very bad pain and also have any of these symptoms: ? A fever. ? Feeling sick to your stomach (nausea). ? Throwing up (vomiting). ? Being very sweaty.  You pass out (lose consciousness). Summary  Pelvic pain is pain in your lower belly (abdomen), below your belly button and between your hips.  There are many possible causes of pelvic pain.  Keep a journal of your pelvic pain. This information is not intended to replace advice given to you by your health care provider. Make sure you discuss any questions you have with your health care provider. Document  Revised: 06/06/2017 Document Reviewed: 06/06/2017 [employer] Patient Education  2020 [employer]  

## 2019-05-13 NOTE — Progress Notes (Signed)
GYNECOLOGY PROGRESS NOTE  Subjective:    Patient ID: Amy Wolfe, female    DOB: 1975-06-13, 44 y.o.   MRN: [id number]  HPI  Patient is a 44 y.o. [id number] female with a PMH of chronic pancreatitis and blood clots who presents for follow up.  Patient states that she still is experiencing almost monthly bouts of pain with significant nausea and vomiting.  She was admitted to the hospital in March for an episode of pain and vomiting, and on CT scan was noted to have a moderate amount of free fluid in the pelvis (however has prior history of hysterectomy in October 2020, and no masses were noted on her adnexae), no obvious source for the fluid. She did sign out the following day against medical advice, but states that it was due to not feeling like anyone was helping her.   She also reports that lately she has been experiencing some pain with intercourse. Denies any vaginal bleeding or issues with vaginal dryness.   Lastly, patient requests refill of her anti-hypertensive and her reflux medication. Notes that she has not been to see her PCP in a while, and does not remember the name of her PCP as she has only seen her once or twice.   The following portions of the patient's history were reviewed and updated as appropriate: allergies, current medications, past family history, past medical history, past social history, past surgical history and problem list.  Review of Systems Pertinent items noted in HPI and remainder of comprehensive ROS otherwise negative.   Objective:   Blood pressure 120/77, pulse 82, height 5\' 7"  (1.702 m), weight 170 lb 1.6 oz (77.2 kg), last menstrual period 09/30/2018. General appearance: alert and no distress Abdomen: normal findings: no masses palpable and soft, non-tender. Abdominal bloating noted.  Pelvic: external genitalia normal, rectovaginal septum normal.  Vagina without discharge.  Vaginal cuff well-healed. Uterus and cervix surgically absent.  Adnexae  non-palpable, nontender bilaterally.  Extremities: extremities normal, atraumatic, no cyanosis or edema Neurologic: Grossly normal   Labs:  Admission on 03/30/2019, Discharged on 03/31/2019  Component Date Value Ref Range Status  . WBC 03/30/2019 7.1  4.0 - 10.5 K/uL Final  . RBC 03/30/2019 6.47* 3.87 - 5.11 MIL/uL Final  . Hemoglobin 03/30/2019 15.0  12.0 - 15.0 g/dL Final  . HCT 03/30/2019 48.2* 36.0 - 46.0 % Final  . MCV 03/30/2019 74.5* 80.0 - 100.0 fL Final  . MCH 03/30/2019 23.2* 26.0 - 34.0 pg Final  . MCHC 03/30/2019 31.1  30.0 - 36.0 g/dL Final  . RDW 03/30/2019 20.1* 11.5 - 15.5 % Final  . Platelets 03/30/2019 368  150 - 400 K/uL Final  . nRBC 03/30/2019 0.0  0.0 - 0.2 % Final  . Neutrophils Relative % 03/30/2019 68  % Final  . Neutro Abs 03/30/2019 4.8  1.7 - 7.7 K/uL Final  . Lymphocytes Relative 03/30/2019 22  % Final  . Lymphs Abs 03/30/2019 1.5  0.7 - 4.0 K/uL Final  . Monocytes Relative 03/30/2019 9  % Final  . Monocytes Absolute 03/30/2019 0.7  0.1 - 1.0 K/uL Final  . Eosinophils Relative 03/30/2019 1  % Final  . Eosinophils Absolute 03/30/2019 0.1  0.0 - 0.5 K/uL Final  . Basophils Relative 03/30/2019 0  % Final  . Basophils Absolute 03/30/2019 0.0  0.0 - 0.1 K/uL Final  . Immature Granulocytes 03/30/2019 0  % Final  . Abs Immature Granulocytes 03/30/2019 0.02  0.00 - 0.07 K/uL Final  Performed at [facility], [address], [place], [place] [address]  . Sodium 03/30/2019 136  135 - 145 mmol/L Final  . Potassium 03/30/2019 4.2  3.5 - 5.1 mmol/L Final   HEMOLYSIS AT THIS LEVEL MAY AFFECT RESULT  . Chloride 03/30/2019 95* 98 - 111 mmol/L Final  . CO2 03/30/2019 26  22 - 32 mmol/L Final  . Glucose, Bld 03/30/2019 136* 70 - 99 mg/dL Final   Glucose reference range applies only to samples taken after fasting for at least 8 hours.  . BUN 03/30/2019 16  6 - 20 mg/dL Final  . Creatinine, Ser 03/30/2019 0.87  0.44 - 1.00 mg/dL Final  . Calcium 03/30/2019  9.7  8.9 - 10.3 mg/dL Final  . Total Protein 03/30/2019 8.6* 6.5 - 8.1 g/dL Final  . Albumin 03/30/2019 4.1  3.5 - 5.0 g/dL Final  . AST 03/30/2019 25  15 - 41 U/L Final  . ALT 03/30/2019 18  0 - 44 U/L Final  . Alkaline Phosphatase 03/30/2019 76  38 - 126 U/L Final  . Total Bilirubin 03/30/2019 1.0  0.3 - 1.2 mg/dL Final  . GFR calc non Af Amer 03/30/2019 >60  >60 mL/min Final  . GFR calc Af Amer 03/30/2019 >60  >60 mL/min Final  . Anion gap 03/30/2019 15  5 - 15 Final   Performed at [facility], [address] [place], [place] [address]  . hCG, Beta Chain, Quant, S 03/30/2019 <1  <5 mIU/mL Final   Comment:          GEST. AGE      CONC.  (mIU/mL)   <=1 WEEK        5 - 50     2 WEEKS       50 - 500     3 WEEKS       100 - 10,000     4 WEEKS     1,000 - 30,000     5 WEEKS     3,500 - 115,000   6-8 WEEKS     12,000 - 270,000    12 WEEKS     15,000 - 220,000        FEMALE AND NON-PREGNANT FEMALE:     LESS THAN 5 mIU/mL Performed at [facility], [address] [place], [place] [address]   . Lipase 03/30/2019 22  11 - 51 U/L Final   Performed at [facility], [address] [place], [place] [address]  . Color, Urine 03/31/2019 YELLOW* YELLOW Final  . APPearance 03/31/2019 CLEAR* CLEAR Final  . Specific Gravity, Urine 03/31/2019 1.038* 1.005 - 1.030 Final  . pH 03/31/2019 5.0  5.0 - 8.0 Final  . Glucose, UA 03/31/2019 NEGATIVE  NEGATIVE mg/dL Final  . Hgb urine dipstick 03/31/2019 NEGATIVE  NEGATIVE Final  . Bilirubin Urine 03/31/2019 NEGATIVE  NEGATIVE Final  . Ketones, ur 03/31/2019 NEGATIVE  NEGATIVE mg/dL Final  . Protein, ur 03/31/2019 NEGATIVE  NEGATIVE mg/dL Final  . Nitrite 03/31/2019 NEGATIVE  NEGATIVE Final  . Chalmers Guest 03/31/2019 NEGATIVE  NEGATIVE Final  . RBC / HPF 03/31/2019 0-5  0 - 5 RBC/hpf Final  . WBC, UA 03/31/2019 0-5  0 - 5 WBC/hpf Final  . Bacteria, UA 03/31/2019 NONE SEEN  NONE SEEN Final  . Squamous Epithelial / LPF  03/31/2019 0-5  0 - 5 Final  . Mucus 03/31/2019 PRESENT   Final   Performed at [facility], [address],  [place], [place] [address]  . Tricyclic, Ur Screen [id number] NONE DETECTED  NONE DETECTED Final  . Amphetamines, Ur Screen 03/31/2019 NONE DETECTED  NONE DETECTED Final  . MDMA (Ecstasy)Ur Screen 03/31/2019 NONE DETECTED  NONE DETECTED Final  . Cocaine Metabolite,Ur [place] 03/31/2019 NONE DETECTED  NONE DETECTED Final  . Opiate, Ur Screen 03/31/2019 POSITIVE* NONE DETECTED Final  . Phencyclidine (PCP) Ur S 03/31/2019 NONE DETECTED  NONE DETECTED Final  . Cannabinoid 50 Ng, Ur [place] 03/31/2019 POSITIVE* NONE DETECTED Final  . Barbiturates, Ur Screen 03/31/2019 NONE DETECTED  NONE DETECTED Final  . Benzodiazepine, Ur Scrn 03/31/2019 POSITIVE* NONE DETECTED Final  . Methadone Scn, Ur 03/31/2019 NONE DETECTED  NONE DETECTED Final   Comment: (NOTE) Tricyclics + metabolites, urine    Cutoff 1000 ng/mL Amphetamines + metabolites, urine  Cutoff 1000 ng/mL MDMA (Ecstasy), urine              Cutoff 500 ng/mL Cocaine Metabolite, urine          Cutoff 300 ng/mL Opiate + metabolites, urine        Cutoff 300 ng/mL Phencyclidine (PCP), urine         Cutoff 25 ng/mL Cannabinoid, urine                 Cutoff 50 ng/mL Barbiturates + metabolites, urine  Cutoff 200 ng/mL Benzodiazepine, urine              Cutoff 200 ng/mL Methadone, urine                   Cutoff 300 ng/mL The urine drug screen provides only a preliminary, unconfirmed analytical test result and should not be used for non-medical purposes. Clinical consideration and professional judgment should be applied to any positive drug screen result due to possible interfering substances. A more specific alternate chemical method must be used in order to obtain a confirmed analytical result. Gas chromatography / mass spectrometry (GC/MS) is the preferred confirmat                          ory method. Performed at [facility],  [address], [place], [place] [address]   . Troponin I (High Sensitivity) 03/30/2019 5  <18 ng/L Final   Comment: (NOTE) Elevated high sensitivity troponin I (hsTnI) values and significant  changes across serial measurements may suggest ACS but many other  chronic and acute conditions are known to elevate hsTnI results.  Refer to the "Links" section for chest pain algorithms and additional  guidance. Performed at [facility], [address], [place], [place] [address]   . Total CK 03/30/2019 43  38 - 234 U/L Final   Performed at [facility], [address] [place], [place] [address]  . Alcohol, Ethyl (B) 03/30/2019 <10  <10 mg/dL Final   Comment: (NOTE) Lowest detectable limit for serum alcohol is 10 mg/dL. For medical purposes only. Performed at [facility], [address] [place], [place] [address]   . Troponin I (High Sensitivity) 03/30/2019 6  <18 ng/L Final   Comment: (NOTE) Elevated high sensitivity troponin I (hsTnI) values and significant  changes across serial measurements may suggest ACS but many other  chronic and acute conditions are known to elevate hsTnI results.  Refer to the "Links" section for chest pain algorithms and additional  guidance. Performed at [facility], [address], [place], [place] [address]   . SARS Coronavirus  2 by RT PCR 03/30/2019 NEGATIVE  NEGATIVE Final   Comment: (NOTE) SARS-CoV-2 target nucleic acids are NOT DETECTED. The SARS-CoV-2 RNA is generally detectable in upper respiratoy specimens during the acute phase of infection. The lowest concentration of SARS-CoV-2 viral copies this assay can detect is 131 copies/mL. A negative result does not preclude SARS-Cov-2 infection and should not be used as the sole basis for treatment or other patient management decisions. A negative result may occur with  improper specimen collection/handling, submission of specimen other than nasopharyngeal  swab, presence of viral mutation(s) within the areas targeted by this assay, and inadequate number of viral copies (<131 copies/mL). A negative result must be combined with clinical observations, patient history, and epidemiological information. The expected result is Negative. Fact Sheet for Patients:  PinkCheek.be Fact Sheet for Healthcare Providers:  GravelBags.it This test is not yet ap                          proved or cleared by the [place] FDA and  has been authorized for detection and/or diagnosis of SARS-CoV-2 by FDA under an Emergency Use Authorization (EUA). This EUA will remain  in effect (meaning this test can be used) for the duration of the COVID-19 declaration under Section 564(b)(1) of the Act, 21 U.S.C. section 360bbb-3(b)(1), unless the authorization is terminated or revoked sooner.   . Influenza A by PCR 03/30/2019 NEGATIVE  NEGATIVE Final  . Influenza B by PCR 03/30/2019 NEGATIVE  NEGATIVE Final   Comment: (NOTE) The Xpert Xpress SARS-CoV-2/FLU/RSV assay is intended as an aid in  the diagnosis of influenza from Nasopharyngeal swab specimens and  should not be used as a sole basis for treatment. Nasal washings and  aspirates are unacceptable for Xpert Xpress SARS-CoV-2/FLU/RSV  testing. Fact Sheet for Patients: PinkCheek.be Fact Sheet for Healthcare Providers: GravelBags.it This test is not yet approved or cleared by the [place] FDA and  has been authorized for detection and/or diagnosis of SARS-CoV-2 by  FDA under an Emergency Use Authorization (EUA). This EUA will remain  in effect (meaning this test can be used) for the duration of the  Covid-19 declaration under Section 564(b)(1) of the Act, 21  U.S.C. section 360bbb-3(b)(1), unless the authorization is  terminated or revoked. Performed at [facility], [address] [place], [place] [address]      Imaging: US PELVIC COMPLETE W TRANSVAGINAL AND TORSION R/O CLINICAL DATA:  Free fluid in the pelvis.  Prior hysterectomy.  EXAM: TRANSABDOMINAL AND TRANSVAGINAL ULTRASOUND OF PELVIS  DOPPLER ULTRASOUND OF OVARIES  TECHNIQUE: Both transabdominal and transvaginal ultrasound examinations of the pelvis were performed. Transabdominal technique was performed for global imaging of the pelvis including uterus, ovaries, adnexal regions, and pelvic cul-de-sac.  It was necessary to proceed with endovaginal exam following the transabdominal exam to visualize the adnexal structures. Color and duplex Doppler ultrasound was utilized to evaluate blood flow to the ovaries.  COMPARISON:  CT abdomen pelvis earlier same day  FINDINGS: Uterus  Surgically absent  Right ovary  Measurements: 2.1 x 1.7 x 2.6 cm = volume: 4.7 mL. Normal appearance/no adnexal mass.  Left ovary  Measurements: 3.3 x 2.2 x 2.7 cm = volume: 10.4 mL. Normal appearance/no adnexal mass.  Pulsed Doppler evaluation of both ovaries demonstrates normal low-resistance arterial and venous waveforms.  Other findings  Moderate volume free fluid in the pelvis.  IMPRESSION: Moderate volume complex fluid in the pelvis.  Normal appearance of  the right and left ovaries.  Electronically Signed   By: Lovey Newcomer M.D.   On: 03/30/2019 13:43 CT Angio Chest PE W and/or Wo Contrast CLINICAL DATA:  Patient with diffuse pain. Prior history of pancreatitis. Vomiting.  EXAM: CT ANGIOGRAPHY CHEST  CT ABDOMEN AND PELVIS WITH CONTRAST  TECHNIQUE: Multidetector CT imaging of the chest was performed using the standard protocol during bolus administration of intravenous contrast. Multiplanar CT image reconstructions and MIPs were obtained to evaluate the vascular anatomy. Multidetector CT imaging of the abdomen and pelvis was performed using the standard protocol during bolus administration  of intravenous contrast.  CONTRAST:  12mL OMNIPAQUE IOHEXOL 350 MG/ML SOLN  COMPARISON:  CT abdomen pelvis 11/18/2018  FINDINGS: CTA CHEST FINDINGS  Cardiovascular: Normal heart size. Aorta and main pulmonary artery normal in caliber. Adequate opacification of the pulmonary arterial system. No intraluminal filling defects identified to suggest acute pulmonary embolus.  Mediastinum/Nodes: No enlarged axillary, mediastinal or hilar lymphadenopathy. Normal appearance of the esophagus.  Lungs/Pleura: Central airways are patent. Dependent atelectasis. 5 mm right lower lobe nodule (image 53; series 6). 4 mm left lower lobe nodule (image 63; series 6). No pleural effusion or pneumothorax.  Musculoskeletal: No aggressive or acute appearing osseous lesions.  Review of the MIP images confirms the above findings.  CT ABDOMEN and PELVIS FINDINGS  Hepatobiliary: Liver is normal in size and contour. No focal lesion identified. Gallbladder is unremarkable. Cavernous transformation of the portal vein.  Pancreas: Multiple calcifications within the pancreatic head/uncinate process compatible with chronic calcific pancreatitis. Similar low-attenuation region within the pancreatic head, previously evaluated with MRI representing a small cystic lesion.  Spleen: Unremarkable  Adrenals/Urinary Tract: Normal adrenal glands. Kidneys enhance symmetrically with contrast. No hydronephrosis. Urinary bladder is unremarkable.  Stomach/Bowel: Normal morphology of the stomach. No evidence for small bowel obstruction. No free intraperitoneal air. Normal appendix.  Vascular/Lymphatic: Normal caliber abdominal aorta. No retroperitoneal lymphadenopathy. Multiple collateral vessels within the left upper quadrant.  Reproductive: Prior hysterectomy. Adnexal structures unremarkable. Moderate volume free fluid in the pelvis.  Other: None.  Musculoskeletal: No aggressive or acute appearing osseous  lesions.  Review of the MIP images confirms the above findings.  IMPRESSION: 1. No evidence for acute pulmonary embolus. 2. Findings compatible with chronic calcific pancreatitis. 3. Cavernous transformation of the portal vein. 4. Nonspecific moderate volume free fluid in the pelvis. 5. Pulmonary nodules measuring up to 5 mm. No follow-up needed if patient is low-risk (and has no known or suspected primary neoplasm). Non-contrast chest CT can be considered in 12 months if patient is high-risk. This recommendation follows the consensus statement: Guidelines for Management of Incidental Pulmonary Nodules Detected on CT Images: From the Fleischner Society 2017; Radiology 2017; 284:228-243.  Electronically Signed   By: Lovey Newcomer M.D.   On: 03/30/2019 11:55 CT ABDOMEN PELVIS W CONTRAST CLINICAL DATA:  Patient with diffuse pain. Prior history of pancreatitis. Vomiting.  EXAM: CT ANGIOGRAPHY CHEST  CT ABDOMEN AND PELVIS WITH CONTRAST  TECHNIQUE: Multidetector CT imaging of the chest was performed using the standard protocol during bolus administration of intravenous contrast. Multiplanar CT image reconstructions and MIPs were obtained to evaluate the vascular anatomy. Multidetector CT imaging of the abdomen and pelvis was performed using the standard protocol during bolus administration of intravenous contrast.  CONTRAST:  38mL OMNIPAQUE IOHEXOL 350 MG/ML SOLN  COMPARISON:  CT abdomen pelvis 11/18/2018  FINDINGS: CTA CHEST FINDINGS  Cardiovascular: Normal heart size. Aorta and main pulmonary artery normal in caliber. Adequate opacification  of the pulmonary arterial system. No intraluminal filling defects identified to suggest acute pulmonary embolus.  Mediastinum/Nodes: No enlarged axillary, mediastinal or hilar lymphadenopathy. Normal appearance of the esophagus.  Lungs/Pleura: Central airways are patent. Dependent atelectasis. 5 mm right lower lobe nodule (image 53;  series 6). 4 mm left lower lobe nodule (image 63; series 6). No pleural effusion or pneumothorax.  Musculoskeletal: No aggressive or acute appearing osseous lesions.  Review of the MIP images confirms the above findings.  CT ABDOMEN and PELVIS FINDINGS  Hepatobiliary: Liver is normal in size and contour. No focal lesion identified. Gallbladder is unremarkable. Cavernous transformation of the portal vein.  Pancreas: Multiple calcifications within the pancreatic head/uncinate process compatible with chronic calcific pancreatitis. Similar low-attenuation region within the pancreatic head, previously evaluated with MRI representing a small cystic lesion.  Spleen: Unremarkable  Adrenals/Urinary Tract: Normal adrenal glands. Kidneys enhance symmetrically with contrast. No hydronephrosis. Urinary bladder is unremarkable.  Stomach/Bowel: Normal morphology of the stomach. No evidence for small bowel obstruction. No free intraperitoneal air. Normal appendix.  Vascular/Lymphatic: Normal caliber abdominal aorta. No retroperitoneal lymphadenopathy. Multiple collateral vessels within the left upper quadrant.  Reproductive: Prior hysterectomy. Adnexal structures unremarkable. Moderate volume free fluid in the pelvis.  Other: None.  Musculoskeletal: No aggressive or acute appearing osseous lesions.  Review of the MIP images confirms the above findings.  IMPRESSION: 1. No evidence for acute pulmonary embolus. 2. Findings compatible with chronic calcific pancreatitis. 3. Cavernous transformation of the portal vein. 4. Nonspecific moderate volume free fluid in the pelvis. 5. Pulmonary nodules measuring up to 5 mm. No follow-up needed if patient is low-risk (and has no known or suspected primary neoplasm). Non-contrast chest CT can be considered in 12 months if patient is high-risk. This recommendation follows the consensus statement: Guidelines for Management of Incidental Pulmonary  Nodules Detected on CT Images: From the Fleischner Society 2017; Radiology 2017; 284:228-243.  Electronically Signed   By: Lovey Newcomer M.D.   On: 03/30/2019 11:55 CT Head Wo Contrast CLINICAL DATA:  Trauma, headache  EXAM: CT HEAD WITHOUT CONTRAST  TECHNIQUE: Contiguous axial images were obtained from the base of the skull through the vertex without intravenous contrast.  COMPARISON:  None.  FINDINGS: Brain: No evidence of acute infarction, hemorrhage, hydrocephalus, extra-axial collection or mass lesion/mass effect.  Vascular: No hyperdense vessel or unexpected calcification.  Skull: No osseous abnormality.  Sinuses/Orbits: Visualized paranasal sinuses are clear. Visualized mastoid sinuses are clear. Visualized orbits demonstrate no focal abnormality.  Other: None  IMPRESSION: No acute intracranial pathology.  Electronically Signed   By: Kathreen Devoid   On: 03/30/2019 11:50   Assessment:   1. Generalized abdominal pain   2. Non-intractable vomiting with nausea, unspecified vomiting type   3. History of hysterectomy   4. Free fluid in pelvis   5. Essential hypertension   6. Chronic pancreatitis, unspecified pancreatitis type ([place])    Plan:   1. Unclear cause of pain or nausea and vomiting.  Pain was previously thought to be caused by uterine fibroids, however she continues to have pain s/p hysterectomy. Patient does have a history of chronic pancreatitis.  Needs f/u with an internist due to her medical history. Was seen by [facility] in the past. Will see if she can get re-established.  2. As patient notes cyclic nature of her nausea/vomiting and pain, Can attempt to prescribe hormonal suppression of her ovaries to assess if this is the cause. She has a prior history of blood clots, and  so cannot use estrogen-containing products. Will give progesterone pills, given samples of Slynd x 2 months.  3. Unclear cause of free fluid.  Not likely due to pelvic organs  as no pathology was noted. Could be ascites that localized in the pelvis due to gravity.  4. Refill given on Amlodipine and Protonix, per request.  5. Return for f/u 1-2 months, can Mychart if symptoms persist.   Rubie Maid, MD [facility]

## 2019-05-15 ENCOUNTER — Telehealth: Payer: Self-pay | Admitting: Obstetrics and Gynecology

## 2019-05-15 NOTE — Telephone Encounter (Signed)
Pt called in and stated that she needs a refill on her nausea meds. Pt uses [facility] [address]. Please advise

## 2019-05-16 MED ORDER — ONDANSETRON HCL 4 MG PO TABS: 4.0000 mg | ORAL_TABLET | Freq: Three times a day (TID) | ORAL | 0 refills | Status: DC | PRN

## 2019-05-17 ENCOUNTER — Encounter: Payer: Self-pay | Admitting: Obstetrics and Gynecology

## 2019-05-20 NOTE — Telephone Encounter (Signed)
Spoke with pt to see if [facility] informed her of medication refill. Tried to call pt on Friday after refilling the Zofran and was unable to call out due to the phone lines in the office was not working. Pt stated that she received a message from [facility] stating that she had a refill.

## 2019-06-01 ENCOUNTER — Other Ambulatory Visit: Payer: Self-pay

## 2019-06-01 ENCOUNTER — Emergency Department
Admission: EM | Admit: 2019-06-01 | Discharge: 2019-06-01 | Disposition: A | Discharge status: Home / Self Care | Payer: Medicaid Other | Attending: Emergency Medicine | Admitting: Emergency Medicine

## 2019-06-01 DIAGNOSIS — K859 Acute pancreatitis without necrosis or infection, unspecified: Secondary | ICD-10-CM | POA: Diagnosis not present

## 2019-06-01 DIAGNOSIS — F17210 Nicotine dependence, cigarettes, uncomplicated: Secondary | ICD-10-CM | POA: Diagnosis not present

## 2019-06-01 DIAGNOSIS — Z79899 Other long term (current) drug therapy: Secondary | ICD-10-CM | POA: Diagnosis not present

## 2019-06-01 DIAGNOSIS — I10 Essential (primary) hypertension: Secondary | ICD-10-CM | POA: Insufficient documentation

## 2019-06-01 DIAGNOSIS — R1011 Right upper quadrant pain: Secondary | ICD-10-CM | POA: Diagnosis present

## 2019-06-01 DIAGNOSIS — R109 Unspecified abdominal pain: Secondary | ICD-10-CM

## 2019-06-01 LAB — COMPREHENSIVE METABOLIC PANEL
ALT: 16 U/L (ref 0–44)
AST: 19 U/L (ref 15–41)
Albumin: 4.2 g/dL (ref 3.5–5.0)
Alkaline Phosphatase: 76 U/L (ref 38–126)
Anion gap: 8 (ref 5–15)
BUN: 14 mg/dL (ref 6–20)
CO2: 27 mmol/L (ref 22–32)
Calcium: 9.4 mg/dL (ref 8.9–10.3)
Chloride: 105 mmol/L (ref 98–111)
Creatinine, Ser: 0.7 mg/dL (ref 0.44–1.00)
GFR calc Af Amer: >60 mL/min (ref >60)
GFR calc non Af Amer: >60 mL/min (ref >60)
Glucose, Bld: 132 mg/dL — ABNORMAL HIGH (ref 70–99)
Potassium: 3.3 mmol/L — ABNORMAL LOW (ref 3.5–5.1)
Sodium: 140 mmol/L (ref 135–145)
Total Bilirubin: 0.7 mg/dL (ref 0.3–1.2)
Total Protein: 8 g/dL (ref 6.5–8.1)

## 2019-06-01 LAB — CBC WITH DIFFERENTIAL/PLATELET
Abs Immature Granulocytes: 0.02 10*3/uL (ref 0.00–0.07)
Basophils Absolute: 0 10*3/uL (ref 0.0–0.1)
Basophils Relative: 1 %
Eosinophils Absolute: 0.1 10*3/uL (ref 0.0–0.5)
Eosinophils Relative: 1 %
HCT: 44.3 % (ref 36.0–46.0)
Hemoglobin: 14.1 g/dL (ref 12.0–15.0)
Immature Granulocytes: 0 %
Lymphocytes Relative: 17 %
Lymphs Abs: 1.3 10*3/uL (ref 0.7–4.0)
MCH: 24.6 pg — ABNORMAL LOW (ref 26.0–34.0)
MCHC: 31.8 g/dL (ref 30.0–36.0)
MCV: 77.2 fL — ABNORMAL LOW (ref 80.0–100.0)
Monocytes Absolute: 0.5 10*3/uL (ref 0.1–1.0)
Monocytes Relative: 7 %
Neutro Abs: 5.6 10*3/uL (ref 1.7–7.7)
Neutrophils Relative %: 74 %
Platelets: 362 10*3/uL (ref 150–400)
RBC: 5.74 MIL/uL — ABNORMAL HIGH (ref 3.87–5.11)
RDW: 21.2 % — ABNORMAL HIGH (ref 11.5–15.5)
Smear Review: NORMAL
WBC: 7.5 10*3/uL (ref 4.0–10.5)
nRBC: 0 % (ref 0.0–0.2)

## 2019-06-01 LAB — URINALYSIS, COMPLETE (UACMP) WITH MICROSCOPIC
Bacteria, UA: NONE SEEN
Bilirubin Urine: NEGATIVE
Glucose, UA: NEGATIVE mg/dL
Hgb urine dipstick: NEGATIVE
Ketones, ur: 5 mg/dL — AB
Leukocytes,Ua: NEGATIVE
Nitrite: NEGATIVE
Protein, ur: NEGATIVE mg/dL
Specific Gravity, Urine: 1.029 (ref 1.005–1.030)
pH: 6 (ref 5.0–8.0)

## 2019-06-01 LAB — LIPASE, BLOOD: Lipase: 792 U/L — ABNORMAL HIGH (ref 11–51)

## 2019-06-01 MED ORDER — SODIUM CHLORIDE 0.9 % IV BOLUS
1000.0000 mL | Freq: Once | INTRAVENOUS | Status: AC
  Administered 2019-06-01: 1000 mL via INTRAVENOUS

## 2019-06-01 MED ORDER — HALOPERIDOL LACTATE 5 MG/ML IJ SOLN
5.0000 mg | Freq: Once | INTRAMUSCULAR | Status: AC
  Administered 2019-06-01: 5 mg via INTRAVENOUS
  Filled 2019-06-01: qty 1

## 2019-06-01 MED ORDER — TRAMADOL HCL 50 MG PO TABS: 50.0000 mg | ORAL_TABLET | Freq: Four times a day (QID) | ORAL | 0 refills | Status: AC | PRN

## 2019-06-01 NOTE — ED Notes (Signed)
Pt with c/o 9/10 pain, pt states she has not slept in days and that medicine will not work and she needs dilaudid. Pt given verbal reassurance, warm blankets, and encouraged to try to rest. Pt agrees.

## 2019-06-01 NOTE — ED Notes (Signed)
Pt alert, states pain is better. Pt signed sig pad, sig pad not connecting unable to print. Pt verbalizes receipt of paperwork and discharge instructions. Pt ambulating in room, gait steady, pt refused wheelchair. Pt's boyfriend in lobby to drive pt home per pt and first nurse.

## 2019-06-01 NOTE — ED Notes (Signed)
Pt on phone, pt in NAD.

## 2019-06-01 NOTE — Discharge Instructions (Addendum)
Please seek medical attention for any high fevers, chest pain, shortness of breath, change in behavior, persistent vomiting, bloody stool or any other new or concerning symptoms.  

## 2019-06-01 NOTE — ED Triage Notes (Signed)
Pt states she has abdominal and RUQ pain since 3-4 days ago, unable to eat or drink except pedialyte. Pt states N and V x 3-4 days as well. Pt in bed, moaning, grasping right side. Pt states she comes in here all the time for illness.

## 2019-06-01 NOTE — ED Notes (Signed)
Pt given warm blanket, warm blanket wrapped around right side for comfort.

## 2019-06-01 NOTE — ED Provider Notes (Signed)
[facility] Emergency Department Provider Note  ____________________________________________   I have reviewed the triage vital signs and the nursing notes.   HISTORY  Chief Complaint Abdominal Pain   History limited by: Not Limited   HPI Amy Wolfe is a 44 y.o. female who presents to the emergency department today because of concerns for abdominal pain.  Is located in the right upper quadrant.  Patient states the pain has been present for the past 4 days.  It has been constant.  It is severe.  Associated with nausea and vomiting.  Patient states she has not been able to tolerate oral intake.  She has had this pain previously.  She states she gets it every month.  She has been put on birth control by primary care without any significant relief.  Patient denies any fevers.   Records reviewed. Per medical record review patient has a history of abdominal pain, pancreatitis.   Past Medical History:  Diagnosis Date  . Abdominal pain   . Anxiety   . Coronary artery disease   . Dyspnea   . GERD (gastroesophageal reflux disease)   . H/O blood clots   . Hypertension   . Liver abscess   . Pancreatitis   . PONV (postoperative nausea and vomiting) 11/07/2018  . Thyroid disease   . Uterine fibroid     Patient Active Problem List   Diagnosis Date Noted  . GERD (gastroesophageal reflux disease) 03/30/2019  . Tobacco abuse 03/30/2019  . Abdominal pain 03/30/2019  . Syncope 03/30/2019  . PONV (postoperative nausea and vomiting) 11/07/2018  . Vaginal cuff cellulitis 11/04/2018  . Status post laparoscopic hysterectomy 10/28/2018  . Anticoagulated 10/04/2018  . Pelvic pain 10/04/2018  . 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 History of partial thyroidectomy 10/04/2018  . Symptomatic anemia 06/13/2018  . Swelling of limb 05/17/2018  . Essential hypertension 05/17/2018  . Pancreatitis, chronic ([place]) 05/17/2018  . Portal vein thrombosis  05/17/2018    Past Surgical History:  Procedure Laterality Date  . CESAREAN SECTION    . EMBOLIZATION N/A 06/14/2018   Procedure: Uterine Embolization;  Surgeon: Algernon Huxley, MD;  Location: [place] [facility];  Service: Cardiovascular;  Laterality: N/A;  . laceration finger Right   . LAPAROSCOPIC VAGINAL HYSTERECTOMY WITH SALPINGECTOMY Bilateral 10/28/2018   Procedure: LAPAROSCOPIC ASSISTED VAGINAL HYSTERECTOMY BILATERAL WITH SALPINGECTOMY;  Surgeon: Rubie Maid, MD;  Location: [facility];  Service: Gynecology;  Laterality: Bilateral;  . THYROIDECTOMY, PARTIAL    . VAGINAL HYSTERECTOMY Bilateral 10/28/2018   Procedure: HYSTERECTOMY VAGINAL WITH BILATERAL SALPINGECTOMY;  Surgeon: Rubie Maid, MD;  Location: [facility];  Service: Gynecology;  Laterality: Bilateral;    Prior to Admission medications   Medication Sig Start Date End Date Taking? Authorizing Provider  acetaminophen (TYLENOL 8 HOUR) 650 MG CR tablet Take 1 tablet (650 mg total) by mouth every 8 (eight) hours as needed for pain. 10/30/18   Rubie Maid, MD  amLODipine (NORVASC) 10 MG tablet Take 1 tablet (10 mg total) by mouth at bedtime. 05/13/19   Rubie Maid, MD  CREON 36000 units CPEP capsule TAKE 2 CAPSULES BY MOUTH WITH EACH MEAL AND 1 CAPSULE WITH [facility] Patient taking differently: Take 36,000-72,000 Units by mouth See admin instructions. Take 2 capsules (72000u) by mouth three times daily with meals and take 1 capsule (36000u) by mouth twice daily with snacks 12/04/18   Lucilla Lame, MD  docusate sodium (COLACE) 100 MG capsule Take 1 capsule (100 mg  total) by mouth 2 (two) times daily as needed. Patient taking differently: Take 100 mg by mouth daily.  10/04/18   Hubbard Hartshorn, FNP  ondansetron (ZOFRAN) 4 MG tablet Take 1 tablet (4 mg total) by mouth every 8 (eight) hours as needed for nausea or vomiting. 05/16/19   Rubie Maid, MD  pantoprazole (PROTONIX) 40 MG tablet Take 1 tablet (40 mg total) by mouth daily.  05/13/19   Rubie Maid, MD  rivaroxaban (XARELTO) 20 MG TABS tablet Take 1 tablet (20 mg total) by mouth daily with supper. 02/06/19   Kris Hartmann, NP    Allergies Patient has no known allergies.  Family History  Problem Relation Age of Onset  . Hypertension Mother   . Diabetes Mother   . Cancer Father   . Cancer Paternal Grandmother   . Cancer Paternal Grandfather   . Aneurysm Brother     Social History Social History   Tobacco Use  . Smoking status: Current Every Day Smoker    Packs/day: 0.50    Years: 35.00    Pack years: 17.50    Types: Cigarettes  . Smokeless tobacco: Never Used  Substance Use Topics  . Alcohol use: Not Currently  . Drug use: Yes    Types: Marijuana    Comment: daily    Review of Systems Constitutional: No fever/chills Eyes: No visual changes. ENT: No sore throat. Cardiovascular: Denies chest pain. Respiratory: Denies shortness of breath. Gastrointestinal: Positive for abdominal pain, nausea and vomiting.  Genitourinary: Negative for dysuria. Musculoskeletal: Negative for back pain. Skin: Negative for rash. Neurological: Negative for headaches, focal weakness or numbness.  ____________________________________________   PHYSICAL EXAM:  VITAL SIGNS: ED Triage Vitals  Enc Vitals Group     BP --      Pulse Rate 06/01/19 1115 81     Resp 06/01/19 1115 12     Temp --      Temp src --      SpO2 06/01/19 1115 100 %     Weight 06/01/19 1109 170 lb (77.1 kg)     Height 06/01/19 1109 5\' 7"  (1.702 m)     Head Circumference --      Peak Flow --      Pain Score 06/01/19 1109 9   Constitutional: Alert and oriented.  Eyes: Conjunctivae are normal.  ENT      Head: Normocephalic and atraumatic.      Nose: No congestion/rhinnorhea.      Mouth/Throat: Mucous membranes are moist.      Neck: No stridor. Hematological/Lymphatic/Immunilogical: No cervical lymphadenopathy. Cardiovascular: Normal rate, regular rhythm.  No murmurs, rubs, or  gallops.  Respiratory: Normal respiratory effort without tachypnea nor retractions. Breath sounds are clear and equal bilaterally. No wheezes/rales/rhonchi. Gastrointestinal: Soft and tender to palpation in the RUQ without guarding or rebound. Genitourinary: Deferred Musculoskeletal: Normal range of motion in all extremities. No lower extremity edema. Neurologic:  Normal speech and language. No gross focal neurologic deficits are appreciated.  Skin:  Skin is warm, dry and intact. No rash noted. Psychiatric: Mood and affect are normal. Speech and behavior are normal. Patient exhibits appropriate insight and judgment.  ____________________________________________    LABS (pertinent positives/negatives)  Lipase 792 UA hazy, ketones 5, rbc 0-5, wbc 0-5 CMP wnl k 3.3, glu 132 CBC wbc 7.5, hgb 14.1, plt 362  ____________________________________________   EKG  None  ____________________________________________    RADIOLOGY  None  ____________________________________________   PROCEDURES  Procedures  ____________________________________________  INITIAL IMPRESSION / ASSESSMENT AND PLAN / ED COURSE  Pertinent labs & imaging results that were available during my care of the patient were reviewed by me and considered in my medical decision making (see chart for details).   Presents to the emergency department today because of concerns for abdominal pain.  She states that she has this monthly.  On exam she does have some tenderness to the right upper quadrant.  Per review of recent imaging patient does not have gallstones.  Blood work was checked which showed ovation of her lipase.  Did also discuss with patient possibility of endometrioid given cyclical nature of the pain.  Patient did feel better after medication.  Did feel comfortable going home.  This point I think it is reasonable.  Will give patient prescription for pain medication.  Discussed importance of returning for  worsening symptoms.  ____________________________________________   FINAL CLINICAL IMPRESSION(S) / ED DIAGNOSES  Final diagnoses:  Abdominal pain, unspecified abdominal location  Acute pancreatitis, unspecified complication status, unspecified pancreatitis type     Note: This dictation was prepared with Dragon dictation. Any transcriptional errors that result from this process are unintentional     Nance Pear, MD 06/01/19 1401

## 2019-08-07 ENCOUNTER — Telehealth: Payer: Self-pay | Admitting: Obstetrics and Gynecology

## 2019-08-07 MED ORDER — ONDANSETRON HCL 4 MG PO TABS: 4.0000 mg | ORAL_TABLET | Freq: Three times a day (TID) | ORAL | 2 refills | Status: DC | PRN

## 2019-08-07 NOTE — Addendum Note (Signed)
Addended by: Augusto Gamble on: 08/07/2019 10:07 PM   Modules accepted: Orders

## 2019-08-07 NOTE — Telephone Encounter (Signed)
Pt requested a refill of Zofran and Exleltis. Please advise. Thanks [employer]

## 2019-08-07 NOTE — Telephone Encounter (Signed)
Spoke to pt concerning her refill request. Pt stated that she was still having nausea and needed a refill of her medication Zofran and Exleltis. Pt is aware that I will send her message to  [facility] for review for refill.

## 2019-08-07 NOTE — Telephone Encounter (Signed)
Pt called in and stated that she needs refill on her Exleltis and Zofran. The pt stated that she uses [facility] on [address]. Please advise

## 2019-08-07 NOTE — Telephone Encounter (Signed)
Please inform patient that I can refill her Zofran, but her Exeltis for her portal vein thrombosis is managed by [facility]. She should contact them as this is something I do not manage. Also please inform her that if she does a refill request through her pharmacy, it will automatically route to the appropriate provider.   Dr. Marcelline Mates

## 2019-08-08 ENCOUNTER — Other Ambulatory Visit: Payer: Self-pay

## 2019-08-08 MED ORDER — SLYND 4 MG PO TABS: 4.0000 mg | ORAL_TABLET | Freq: Every day | ORAL | 1 refills | Status: DC

## 2019-08-08 NOTE — Telephone Encounter (Signed)
Pt aware.   Pt states she did not need Exeltis. She wanted her B/C refilled. [facility] gave samples of Slynd. Slynd erx. [facility] advised pt to f/u in 1-2 months.   Appt made for 8/13.

## 2019-08-15 ENCOUNTER — Encounter: Payer: Medicaid Other | Admitting: Obstetrics and Gynecology

## 2019-08-27 ENCOUNTER — Encounter: Payer: Self-pay | Admitting: Obstetrics and Gynecology

## 2019-10-22 ENCOUNTER — Other Ambulatory Visit (INDEPENDENT_AMBULATORY_CARE_PROVIDER_SITE_OTHER): Payer: Self-pay | Admitting: Nurse Practitioner

## 2019-11-25 ENCOUNTER — Ambulatory Visit (INDEPENDENT_AMBULATORY_CARE_PROVIDER_SITE_OTHER): Payer: Medicaid Other | Admitting: Vascular Surgery

## 2020-01-09 ENCOUNTER — Emergency Department
Admission: EM | Admit: 2020-01-09 | Discharge: 2020-01-10 | Disposition: A | Discharge status: Home / Self Care | Payer: Medicaid Other | Attending: Emergency Medicine | Admitting: Emergency Medicine

## 2020-01-09 ENCOUNTER — Emergency Department: Payer: Medicaid Other

## 2020-01-09 ENCOUNTER — Other Ambulatory Visit: Payer: Self-pay

## 2020-01-09 DIAGNOSIS — F10129 Alcohol abuse with intoxication, unspecified: Secondary | ICD-10-CM | POA: Diagnosis not present

## 2020-01-09 DIAGNOSIS — F17210 Nicotine dependence, cigarettes, uncomplicated: Secondary | ICD-10-CM | POA: Insufficient documentation

## 2020-01-09 DIAGNOSIS — Z23 Encounter for immunization: Secondary | ICD-10-CM | POA: Diagnosis not present

## 2020-01-09 DIAGNOSIS — S80211A Abrasion, right knee, initial encounter: Secondary | ICD-10-CM | POA: Insufficient documentation

## 2020-01-09 DIAGNOSIS — S9305XA Dislocation of left ankle joint, initial encounter: Secondary | ICD-10-CM | POA: Diagnosis not present

## 2020-01-09 DIAGNOSIS — S50812A Abrasion of left forearm, initial encounter: Secondary | ICD-10-CM | POA: Diagnosis not present

## 2020-01-09 DIAGNOSIS — S80212A Abrasion, left knee, initial encounter: Secondary | ICD-10-CM | POA: Insufficient documentation

## 2020-01-09 DIAGNOSIS — R45851 Suicidal ideations: Secondary | ICD-10-CM | POA: Insufficient documentation

## 2020-01-09 DIAGNOSIS — S99911A Unspecified injury of right ankle, initial encounter: Secondary | ICD-10-CM | POA: Diagnosis present

## 2020-01-09 DIAGNOSIS — I2510 Atherosclerotic heart disease of native coronary artery without angina pectoris: Secondary | ICD-10-CM | POA: Diagnosis not present

## 2020-01-09 DIAGNOSIS — Y92009 Unspecified place in unspecified non-institutional (private) residence as the place of occurrence of the external cause: Secondary | ICD-10-CM | POA: Insufficient documentation

## 2020-01-09 DIAGNOSIS — Z7901 Long term (current) use of anticoagulants: Secondary | ICD-10-CM | POA: Diagnosis not present

## 2020-01-09 DIAGNOSIS — Y9301 Activity, walking, marching and hiking: Secondary | ICD-10-CM | POA: Insufficient documentation

## 2020-01-09 DIAGNOSIS — Z20822 Contact with and (suspected) exposure to covid-19: Secondary | ICD-10-CM | POA: Diagnosis not present

## 2020-01-09 DIAGNOSIS — S91002A Unspecified open wound, left ankle, initial encounter: Secondary | ICD-10-CM

## 2020-01-09 DIAGNOSIS — Z79899 Other long term (current) drug therapy: Secondary | ICD-10-CM | POA: Insufficient documentation

## 2020-01-09 DIAGNOSIS — I10 Essential (primary) hypertension: Secondary | ICD-10-CM | POA: Insufficient documentation

## 2020-01-09 DIAGNOSIS — S50811A Abrasion of right forearm, initial encounter: Secondary | ICD-10-CM | POA: Diagnosis not present

## 2020-01-09 DIAGNOSIS — W108XXA Fall (on) (from) other stairs and steps, initial encounter: Secondary | ICD-10-CM | POA: Insufficient documentation

## 2020-01-09 DIAGNOSIS — F10920 Alcohol use, unspecified with intoxication, uncomplicated: Secondary | ICD-10-CM

## 2020-01-09 LAB — URINE DRUG SCREEN, QUALITATIVE (ARMC ONLY)
Amphetamines, Ur Screen: NOT DETECTED
Barbiturates, Ur Screen: NOT DETECTED
Benzodiazepine, Ur Scrn: POSITIVE — AB
Cannabinoid 50 Ng, Ur ~~LOC~~: POSITIVE — AB
Cocaine Metabolite,Ur ~~LOC~~: NOT DETECTED
MDMA (Ecstasy)Ur Screen: NOT DETECTED
Methadone Scn, Ur: NOT DETECTED
Opiate, Ur Screen: NOT DETECTED
Phencyclidine (PCP) Ur S: NOT DETECTED
Tricyclic, Ur Screen: NOT DETECTED

## 2020-01-09 LAB — URINALYSIS, COMPLETE (UACMP) WITH MICROSCOPIC
Bilirubin Urine: NEGATIVE
Glucose, UA: NEGATIVE mg/dL
Ketones, ur: 5 mg/dL — AB
Leukocytes,Ua: NEGATIVE
Nitrite: NEGATIVE
Protein, ur: NEGATIVE mg/dL
Specific Gravity, Urine: 1.02 (ref 1.005–1.030)
pH: 5 (ref 5.0–8.0)

## 2020-01-09 LAB — COMPREHENSIVE METABOLIC PANEL
ALT: 31 U/L (ref 0–44)
AST: 30 U/L (ref 15–41)
Albumin: 4.3 g/dL (ref 3.5–5.0)
Alkaline Phosphatase: 81 U/L (ref 38–126)
Anion gap: 16 — ABNORMAL HIGH (ref 5–15)
BUN: 9 mg/dL (ref 6–20)
CO2: 19 mmol/L — ABNORMAL LOW (ref 22–32)
Calcium: 9.2 mg/dL (ref 8.9–10.3)
Chloride: 102 mmol/L (ref 98–111)
Creatinine, Ser: 0.58 mg/dL (ref 0.44–1.00)
GFR, Estimated: >60 mL/min (ref >60)
Glucose, Bld: 126 mg/dL — ABNORMAL HIGH (ref 70–99)
Potassium: 3 mmol/L — ABNORMAL LOW (ref 3.5–5.1)
Sodium: 137 mmol/L (ref 135–145)
Total Bilirubin: 0.8 mg/dL (ref 0.3–1.2)
Total Protein: 8.3 g/dL — ABNORMAL HIGH (ref 6.5–8.1)

## 2020-01-09 LAB — CBC WITH DIFFERENTIAL/PLATELET
Abs Immature Granulocytes: 0.02 10*3/uL (ref 0.00–0.07)
Basophils Absolute: 0 10*3/uL (ref 0.0–0.1)
Basophils Relative: 0 %
Eosinophils Absolute: 0 10*3/uL (ref 0.0–0.5)
Eosinophils Relative: 0 %
HCT: 53.4 % — ABNORMAL HIGH (ref 36.0–46.0)
Hemoglobin: 16.8 g/dL — ABNORMAL HIGH (ref 12.0–15.0)
Immature Granulocytes: 0 %
Lymphocytes Relative: 15 %
Lymphs Abs: 1 10*3/uL (ref 0.7–4.0)
MCH: 26.8 pg (ref 26.0–34.0)
MCHC: 31.5 g/dL (ref 30.0–36.0)
MCV: 85 fL (ref 80.0–100.0)
Monocytes Absolute: 0.5 10*3/uL (ref 0.1–1.0)
Monocytes Relative: 8 %
Neutro Abs: 5.5 10*3/uL (ref 1.7–7.7)
Neutrophils Relative %: 77 %
Platelets: 244 10*3/uL (ref 150–400)
RBC: 6.28 MIL/uL — ABNORMAL HIGH (ref 3.87–5.11)
RDW: 15 % (ref 11.5–15.5)
WBC: 7.1 10*3/uL (ref 4.0–10.5)
nRBC: 0 % (ref 0.0–0.2)

## 2020-01-09 LAB — RESP PANEL BY RT-PCR (FLU A&B, COVID) ARPGX2
Influenza A by PCR: NEGATIVE
Influenza B by PCR: NEGATIVE
SARS Coronavirus 2 by RT PCR: NEGATIVE

## 2020-01-09 LAB — ACETAMINOPHEN LEVEL: Acetaminophen (Tylenol), Serum: <10 ug/mL — ABNORMAL LOW (ref 10–30)

## 2020-01-09 LAB — SALICYLATE LEVEL: Salicylate Lvl: <7 mg/dL — ABNORMAL LOW (ref 7.0–30.0)

## 2020-01-09 LAB — ETHANOL: Alcohol, Ethyl (B): 199 mg/dL — ABNORMAL HIGH (ref <10)

## 2020-01-09 MED ORDER — SULFAMETHOXAZOLE-TRIMETHOPRIM 800-160 MG PO TABS
1.0000 | ORAL_TABLET | Freq: Two times a day (BID) | ORAL | Status: DC
  Administered 2020-01-09: 1 via ORAL
  Filled 2020-01-09: qty 1

## 2020-01-09 MED ORDER — KETOROLAC TROMETHAMINE 30 MG/ML IJ SOLN
15.0000 mg | Freq: Once | INTRAMUSCULAR | Status: AC
  Administered 2020-01-09: 15 mg via INTRAVENOUS
  Filled 2020-01-09: qty 1

## 2020-01-09 MED ORDER — SODIUM CHLORIDE 0.9 % IV BOLUS
1000.0000 mL | Freq: Once | INTRAVENOUS | Status: AC
  Administered 2020-01-09: 1000 mL via INTRAVENOUS

## 2020-01-09 MED ORDER — CEPHALEXIN 500 MG PO CAPS
500.0000 mg | ORAL_CAPSULE | Freq: Four times a day (QID) | ORAL | Status: DC
  Administered 2020-01-09 – 2020-01-10 (×3): 500 mg via ORAL
  Filled 2020-01-09 (×3): qty 1

## 2020-01-09 MED ORDER — CEFAZOLIN SODIUM-DEXTROSE 2-4 GM/100ML-% IV SOLN
2.0000 g | Freq: Once | INTRAVENOUS | Status: AC
  Administered 2020-01-09: 2 g via INTRAVENOUS
  Filled 2020-01-09: qty 100

## 2020-01-09 MED ORDER — DIPHENHYDRAMINE HCL 50 MG/ML IJ SOLN
50.0000 mg | Freq: Once | INTRAMUSCULAR | Status: AC
  Administered 2020-01-09: 50 mg via INTRAVENOUS

## 2020-01-09 MED ORDER — TETANUS-DIPHTH-ACELL PERTUSSIS 5-2.5-18.5 LF-MCG/0.5 IM SUSY
0.5000 mL | PREFILLED_SYRINGE | Freq: Once | INTRAMUSCULAR | Status: AC
  Administered 2020-01-09: 0.5 mL via INTRAMUSCULAR
  Filled 2020-01-09: qty 0.5

## 2020-01-09 MED ORDER — LIDOCAINE-EPINEPHRINE 2 %-1:100000 IJ SOLN
20.0000 mL | Freq: Once | INTRAMUSCULAR | Status: AC
  Administered 2020-01-09: 20 mL
  Filled 2020-01-09: qty 1

## 2020-01-09 MED ORDER — LORAZEPAM 2 MG/ML IJ SOLN
2.0000 mg | Freq: Once | INTRAMUSCULAR | Status: AC
  Administered 2020-01-09: 2 mg via INTRAVENOUS

## 2020-01-09 MED ORDER — OXYCODONE-ACETAMINOPHEN 5-325 MG PO TABS
1.0000 | ORAL_TABLET | Freq: Once | ORAL | Status: AC
Start: 2020-01-09 — End: 2020-01-09
  Administered 2020-01-09: 1 via ORAL
  Filled 2020-01-09: qty 1

## 2020-01-09 MED ORDER — HALOPERIDOL LACTATE 5 MG/ML IJ SOLN
5.0000 mg | Freq: Once | INTRAMUSCULAR | Status: AC
  Administered 2020-01-09: 5 mg via INTRAVENOUS

## 2020-01-09 NOTE — ED Notes (Addendum)
Moved to [facility] by RN in ED stretcher. Pt remains in hospital gown with PIV infusing. Charge RN, Engineer, civil (consulting) aware. ODS aware that pt is IVC. No belongings bagged by this RN

## 2020-01-09 NOTE — ED Provider Notes (Signed)
[facility] Emergency Department Provider Note  ____________________________________________  Time seen: Approximately 3:00 PM  I have reviewed the triage vital signs and the nursing notes.   HISTORY  Chief Complaint open ankl;e fracture    Level 5 Caveat: Portions of the History and Physical including HPI and review of systems are unable to be completely obtained due to patient being intoxicated and combative  HPI Amy Wolfe is a 45 y.o. female with a history of CAD GERD hypertension obesity who was brought to the ED by police due to intoxication and ankle injury.  Reportedly the patient has been drinking heavily and using marijuana today.  She does not offer any helpful history, she screams expletives at staff and screams about a man in her house.  Police report that they were called to her home today, and she tried to walk down the steps of her porch and sustained a sudden ankle injury which they noted to have an open bone exposure.  EMS gave 50 mcg of fentanyl without relief.  Police also report that patient had expressed an intention to kill herself to them as well.  Past Medical History:  Diagnosis Date  . Abdominal pain   . Anxiety   . Coronary artery disease   . Dyspnea   . GERD (gastroesophageal reflux disease)   . H/O blood clots   . Hypertension   . Liver abscess   . Pancreatitis   . PONV (postoperative nausea and vomiting) 11/07/2018  . Thyroid disease   . Uterine fibroid      Patient Active Problem List   Diagnosis Date Noted  . GERD (gastroesophageal reflux disease) 03/30/2019  . Tobacco abuse 03/30/2019  . Abdominal pain 03/30/2019  . Syncope 03/30/2019  . PONV (postoperative nausea and vomiting) 11/07/2018  . Vaginal cuff cellulitis 11/04/2018  . Status post laparoscopic hysterectomy 10/28/2018  . Anticoagulated 10/04/2018  . Pelvic pain 10/04/2018  . 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 History of  partial thyroidectomy 10/04/2018  . Symptomatic anemia 06/13/2018  . Swelling of limb 05/17/2018  . Essential hypertension 05/17/2018  . Pancreatitis, chronic ([place]) 05/17/2018  . Portal vein thrombosis 05/17/2018     Past Surgical History:  Procedure Laterality Date  . CESAREAN SECTION    . EMBOLIZATION N/A 06/14/2018   Procedure: Uterine Embolization;  Surgeon: Algernon Huxley, MD;  Location: [facility];  Service: Cardiovascular;  Laterality: N/A;  . laceration finger Right   . LAPAROSCOPIC VAGINAL HYSTERECTOMY WITH SALPINGECTOMY Bilateral 10/28/2018   Procedure: LAPAROSCOPIC ASSISTED VAGINAL HYSTERECTOMY BILATERAL WITH SALPINGECTOMY;  Surgeon: Rubie Maid, MD;  Location: [facility];  Service: Gynecology;  Laterality: Bilateral;  . THYROIDECTOMY, PARTIAL    . VAGINAL HYSTERECTOMY Bilateral 10/28/2018   Procedure: HYSTERECTOMY VAGINAL WITH BILATERAL SALPINGECTOMY;  Surgeon: Rubie Maid, MD;  Location: [facility];  Service: Gynecology;  Laterality: Bilateral;     Prior to Admission medications   Medication Sig Start Date End Date Taking? Authorizing Provider  acetaminophen (TYLENOL 8 HOUR) 650 MG CR tablet Take 1 tablet (650 mg total) by mouth every 8 (eight) hours as needed for pain. 10/30/18   Rubie Maid, MD  amLODipine (NORVASC) 10 MG tablet Take 1 tablet (10 mg total) by mouth at bedtime. 05/13/19   Rubie Maid, MD  CREON 36000 units CPEP capsule TAKE 2 CAPSULES BY MOUTH WITH EACH MEAL AND 1 CAPSULE WITH [facility] Patient taking differently: Take 36,000-72,000 Units by mouth See admin instructions. Take 2 capsules (  72000u) by mouth three times daily with meals and take 1 capsule (36000u) by mouth twice daily with snacks 12/04/18   Lucilla Lame, MD  docusate sodium (COLACE) 100 MG capsule Take 1 capsule (100 mg total) by mouth 2 (two) times daily as needed. Patient taking differently: Take 100 mg by mouth daily.  10/04/18   Hubbard Hartshorn, FNP  Drospirenone (SLYND) 4 MG TABS  Take 4 mg by mouth daily. 08/08/19   Rubie Maid, MD  ondansetron (ZOFRAN) 4 MG tablet Take 1 tablet (4 mg total) by mouth every 8 (eight) hours as needed for nausea or vomiting. 08/07/19   Rubie Maid, MD  pantoprazole (PROTONIX) 40 MG tablet Take 1 tablet (40 mg total) by mouth daily. 05/13/19   Rubie Maid, MD  traMADol (ULTRAM) 50 MG tablet Take 1 tablet (50 mg total) by mouth every 6 (six) hours as needed. 06/01/19 05/31/20  Nance Pear, MD  XARELTO 20 MG TABS tablet TAKE 1 TABLET BY MOUTH ONCE DAILY WITH SUPPER 10/22/19   Kris Hartmann, NP     Allergies Patient has no known allergies.   Family History  Problem Relation Age of Onset  . Hypertension Mother   . Diabetes Mother   . Cancer Father   . Cancer Paternal Grandmother   . Cancer Paternal Grandfather   . Aneurysm Brother     Social History Social History   Tobacco Use  . Smoking status: Current Every Day Smoker    Packs/day: 0.50    Years: 35.00    Pack years: 17.50    Types: Cigarettes  . Smokeless tobacco: Never Used  Vaping Use  . Vaping Use: Never used  Substance Use Topics  . Alcohol use: Not Currently  . Drug use: Yes    Types: Marijuana    Comment: daily    Review of Systems Level 5 Caveat: Portions of the History and Physical including HPI and review of systems are unable to be completely obtained due to patient altered mental status  ____________________________________________   PHYSICAL EXAM:  VITAL SIGNS: ED Triage Vitals  Enc Vitals Group     BP 01/09/20 1335 119/83     Pulse Rate 01/09/20 1335 100     Resp 01/09/20 1335 18     Temp 01/09/20 1344 97.9 F (36.6 C)     Temp Source 01/09/20 1344 Oral     SpO2 01/09/20 1335 98 %     Weight 01/09/20 1326 260 lb (117.9 kg)     Height 01/09/20 1326 5\' 6"  (1.676 m)     Head Circumference --      Peak Flow --      Pain Score 01/09/20 1326 10     Pain Loc --      Pain Edu? --      Excl. in [place]? --     Vital signs reviewed, nursing  assessments reviewed.   Constitutional: Patient is awake and alert, not oriented.  Combative. Eyes:   Conjunctivae are normal. EOMI.  ENT      Head:   Normocephalic and atraumatic.      Nose:   No congestion/rhinnorhea.       Mouth/Throat:   MMM, no pharyngeal erythema. No peritonsillar mass.       Neck:   No meningismus. Full ROM. Hematological/Lymphatic/Immunilogical:   No cervical lymphadenopathy. Cardiovascular:   RRR. Symmetric bilateral radial pulses.  PT pulses present bilaterally.  DP pulse is absent bilaterally, unable to Doppler as well.  Left foot is cold to touch compared to right.   Respiratory:   Normal respiratory effort without tachypnea/retractions. Breath sounds are clear and equal bilaterally. No wheezes/rales/rhonchi. Gastrointestinal:   Soft and nontender. Non distended. There is no CVA tenderness.  No rebound, rigidity, or guarding. Musculoskeletal: Obvious deformity of left ankle with inversion and distal fibula protruding through a laceration of the lateral left ankle.  No other deformities, normal range of motion in all of the joints.  Wound is hemostatic. Neurologic:   Slurred speech, combative, swearing at staff.  Motor grossly intact.  Skin:    Skin is warm, dry with ankle laceration as above.  There are multiple scattered abrasions on bilateral knees and forearms.. No rash noted.  No petechiae, purpura, or bullae.  ____________________________________________    LABS (pertinent positives/negatives) (all labs ordered are listed, but only abnormal results are displayed) Labs Reviewed  RESP PANEL BY RT-PCR (FLU A&B, COVID) ARPGX2  COMPREHENSIVE METABOLIC PANEL  ETHANOL  CBC WITH DIFFERENTIAL/PLATELET  SALICYLATE LEVEL  ACETAMINOPHEN LEVEL  URINALYSIS, COMPLETE (UACMP) WITH MICROSCOPIC  URINE DRUG SCREEN, QUALITATIVE ([facility] ONLY)   ____________________________________________   EKG  Interpreted by me  Date: 01/09/2020  Rate: 100  Rhythm: normal  sinus rhythm  QRS Axis: normal  Intervals: normal  ST/T Wave abnormalities: normal  Conduction Disutrbances: none  Narrative Interpretation: unremarkable      ____________________________________________    RADIOLOGY  DG Chest 1 View  Result Date: 01/09/2020 CLINICAL DATA:  Ankle fracture. EXAM: CHEST  1 VIEW COMPARISON:  None. FINDINGS: The heart size and mediastinal contours are within normal limits. Both lungs are clear. The visualized skeletal structures are unremarkable. IMPRESSION: No active disease. Electronically Signed   By: Marijo Conception M.D.   On: 01/09/2020 14:27   DG Knee 2 Views Left  Result Date: 01/09/2020 CLINICAL DATA:  Left knee injury. EXAM: LEFT KNEE - 1-2 VIEW COMPARISON:  None. FINDINGS: No evidence of fracture, dislocation, or joint effusion. No evidence of arthropathy or other focal bone abnormality. Soft tissues are unremarkable. IMPRESSION: Negative. Electronically Signed   By: Marijo Conception M.D.   On: 01/09/2020 14:27   DG Ankle Complete Left  Result Date: 01/09/2020 CLINICAL DATA:  Left ankle fracture. EXAM: LEFT ANKLE COMPLETE - 3+ VIEW COMPARISON:  None. FINDINGS: The left ankle has been casted and immobilized. No fracture or dislocation is noted. Joint spaces are intact. IMPRESSION: No acute abnormality seen in the left ankle. Electronically Signed   By: Marijo Conception M.D.   On: 01/09/2020 14:26    ____________________________________________   PROCEDURES .Critical Care Performed by: Carrie Mew, MD Authorized by: Carrie Mew, MD   Critical care provider statement:    Critical care time (minutes):  35   Critical care time was exclusive of:  Separately billable procedures and treating other patients   Critical care was necessary to treat or prevent imminent or life-threatening deterioration of the following conditions:  Toxidrome and CNS failure or compromise   Critical care was time spent personally by me on the following  activities:  Development of treatment plan with patient or surrogate, discussions with consultants, evaluation of patient's response to treatment, examination of patient, obtaining history from patient or surrogate, ordering and performing treatments and interventions, ordering and review of laboratory studies, ordering and review of radiographic studies, pulse oximetry, re-evaluation of patient's condition and review of old charts Comments:        .Ortho Injury Treatment  Date/Time: 01/09/2020 3:09 PM  Performed by: Carrie Mew, MD Authorized by: Carrie Mew, MD   Consent:    Consent obtained:  Emergent situation   Risks discussed:  Vascular damage, recurrent dislocation and fracture   Alternatives discussed:  Alternative treatmentInjury location: ankle Location details: left ankle Injury type: dislocation Pre-procedure distal perfusion: absent Pre-procedure neurological function: absent Pre-procedure range of motion: reduced  Anesthesia: Local anesthesia used: no  Patient sedated: NoManipulation performed: yes Reduction method: direct traction Reduction successful: yes X-ray confirmed reduction: yes Immobilization: splint Splint type: ankle stirrup and short leg Supplies used: cotton padding,  elastic bandage and Ortho-Glass Post-procedure neurovascular assessment: post-procedure neurovascularly intact Post-procedure distal perfusion: normal Post-procedure neurological function: normal Post-procedure range of motion: improved Comments:     Marland KitchenMarland KitchenLaceration Repair  Date/Time: 01/09/2020 4:40 PM Performed by: Carrie Mew, MD Authorized by: Carrie Mew, MD   Consent:    Consent obtained:  Verbal and emergent situation   Consent given by:  Patient   Risks discussed:  Infection, pain, retained foreign body, poor cosmetic result, poor wound healing and need for additional repair   Alternatives discussed:  Referral Universal protocol:    Patient identity  confirmed:  Arm band Anesthesia:    Anesthesia method:  Local infiltration   Local anesthetic:  Lidocaine 2% WITH epi Laceration details:    Location:  Leg   Leg location:  L lower leg   Length (cm):  5 Pre-procedure details:    Preparation:  Patient was prepped and draped in usual sterile fashion and imaging obtained to evaluate for foreign bodies Exploration:    Hemostasis achieved with:  Direct pressure   Imaging obtained: x-ray     Imaging outcome: foreign body not noted     Wound exploration: wound explored through full range of motion and entire depth of wound visualized     Wound extent: no fascia violation noted, no foreign bodies/material noted, no muscle damage noted, no nerve damage noted, no tendon damage noted and no vascular damage noted     Contaminated: no   Treatment:    Area cleansed with:  Saline and povidone-iodine   Amount of cleaning:  Extensive   Irrigation solution:  Sterile saline   Irrigation volume:  577ml   Irrigation method:  Pressure wash   Visualized foreign bodies/material removed: no     Debridement:  None   Undermining:  Minimal   Scar revision: no   Skin repair:    Repair method:  Sutures   Suture size:  3-0   Suture material:  Nylon   Suture technique:  Running   Number of sutures:  9 Approximation:    Approximation:  Close Repair type:    Repair type:  Intermediate Post-procedure details:    Dressing:  Sterile dressing and bulky dressing   Procedure completion:  Tolerated well, no immediate complications    ____________________________________________  DIFFERENTIAL DIAGNOSIS   Ankle fracture, proximal tibia fracture, intoxication, electrolyte abnormality  CLINICAL IMPRESSION / ASSESSMENT AND PLAN / ED COURSE  Medications ordered in the ED: Medications  sodium chloride 0.9 % bolus 1,000 mL (has no administration in time range)  ceFAZolin (ANCEF) IVPB 2g/100 mL premix (has no administration in time range)  Tdap (BOOSTRIX)  injection 0.5 mL (has no administration in time range)  LORazepam (ATIVAN) injection 2 mg (2 mg Intravenous Given 01/09/20 1323)  diphenhydrAMINE (BENADRYL) injection 50 mg (50 mg Intravenous Given 01/09/20 1323)  haloperidol lactate (HALDOL) injection 5 mg (5 mg Intravenous Given 01/09/20 1325)    Pertinent  labs & imaging results that were available during my care of the patient were reviewed by me and considered in my medical decision making (see chart for details).   Amy Wolfe was evaluated in Emergency Department on 01/09/2020 for the symptoms described in the history of present illness. She was evaluated in the context of the global COVID-19 pandemic, which necessitated consideration that the patient might be at risk for infection with the SARS-CoV-2 virus that causes COVID-19. Institutional protocols and algorithms that pertain to the evaluation of patients at risk for COVID-19 are in a state of rapid change based on information released by regulatory bodies including the CDC and federal and state organizations. These policies and algorithms were followed during the patient's care in the ED.   Patient presents intoxicated and combative.  She was not redirectable, requiring continuous physical restraint to keep herself and staff safe.  She was therefore given Haldol 5 mg IV, Ativan 2 mg IV, Benadryl 50 mg IV to calm her and allow for a safe environment for herself and other patients and staff.  Initial examination of the left ankle worrisome for vascular compromise, so this was reduced right away on arrival with return of good anatomic alignment.  Still no palpable pulse.  Will give 2 g of Ancef IV, tetanus shot, check labs.  ----------------------------------------- 3:08 PM on 01/09/2020 -----------------------------------------  Ankle x-ray and knee x-ray viewed and interpreted by me, no apparent fracture, good anatomic alignment.  Radiology report agrees.  Orthopedics paged for further  management of wound.  Clinical Course as of 01/09/20 1639  Fri Jan 09, 2020  1510 Toes of left foot now warm with normal capillary refill indicating return of perfusion.  The unaffected right foot also does not have a dopplerable pulse, so perfusion may be normalized at this point. [PS]  [id number] IVC initiated due to suicidal ideation and substance abuse and need for psychiatry evaluation once patient is awake again. [PS]  1534 Wound discussed with Dr. Sabra Heck of ortho who reviewed xrays. Recommends irrigation, wound closure, start keflex and bactrim and outpatient follow up [PS]  [id number] Wound repaired. Will start keflex and bactrim PO, will need to continue for 10 days.  Will need to reassess wound in a few days for infection, monitor for signs of compartment syndrome. [PS]    Clinical Course User Index [PS] Carrie Mew, MD   The patient has been placed in psychiatric observation due to the need to provide a safe environment for the patient while obtaining psychiatric consultation and evaluation, as well as ongoing medical and medication management to treat the patient's condition.  The patient has been placed under full IVC at this time.    ____________________________________________   FINAL CLINICAL IMPRESSION(S) / ED DIAGNOSES    Final diagnoses:  Open dislocation of left ankle, initial encounter  Alcoholic intoxication without complication [facility])  Suicidal ideation     ED Discharge Orders    None      Portions of this note were generated with dragon dictation software. Dictation errors may occur despite best attempts at proofreading.   Carrie Mew, MD 01/09/20 [phone]

## 2020-01-09 NOTE — ED Notes (Signed)
Hourly rounding reveals patient in room. No complaints, stable, in no acute distress. Q15 minute rounds and monitoring via Rover and Officer to continue.   

## 2020-01-09 NOTE — ED Notes (Signed)
Report to Georgie, RN.

## 2020-01-09 NOTE — ED Notes (Signed)
Pt currently asleep. Fluids running via gravity.

## 2020-01-09 NOTE — BH Assessment (Addendum)
Comprehensive Clinical Assessment (CCA) Note  01/09/2020 [facility] [id number]  Chief Complaint: Patient is a 45 year old female presenting to [facility] ED under IVC. Per triage note Pt to ED via [place]. Open ankle fracture to L ankle with visual deformity and bone protruding.Police at bedside. Pt handcuffed.Pt screaming. Pt saying that children not safe at home, "get that man out of my house".Per EMS, pt was stepping down front steps and heard a "crack". Ankle broke.EDP, nurses, paramedics and police at bedside. During the incident where patient broke her ankle she had reported to the police the intention to kill herself. During the assessment patient was alert and oriented x4, irritable and not very forthcoming with information. When asked why patient was presenting to the ED patient reported "my foot is broke." "I was fighting with my boyfriend, I called the police and they arrested me." When writer tried to continue assessment patient became angry and irritable. She does report drinking alcohol today but unable to report how much she drank. Patient BAL is 199. Patient currently denies SI/HI/AH/VH and does not appear to be responding to any internal or external stimuli.  Patient disposition pending  Chief Complaint  Patient presents with  . open ankl;e fracture  . Addiction Problem  . Suicidal   Visit Diagnosis: Alcohol Intoxication    CCA Screening, Triage and Referral (STR)  Patient Reported Information How did you hear about [place]? Other (Comment)  Referral name: No data recorded Referral phone number: No data recorded  Whom do you see for routine medical problems? Other (Comment)  Practice/Facility Name: No data recorded Practice/Facility Phone Number: No data recorded Name of Contact: No data recorded Contact Number: No data recorded Contact Fax Number: No data recorded Prescriber Name: No data recorded Prescriber Address (if known): No data recorded  What Is the Reason for Your  Visit/Call Today? Intoxicated, SI  How Long Has This Been Causing You Problems? > than 6 months  What Do You Feel Would Help You the Most Today? Medication; Therapy; Assessment Only   Have You Recently Been in Any Inpatient Treatment (Hospital/Detox/Crisis Center/28-Day Program)? No  Name/Location of Program/Hospital:No data recorded How Long Were You There? No data recorded When Were You Discharged? No data recorded  Have You Ever Received Services From [employer] [facility] Before? No  Who Do You See at [facility]? No data recorded  Have You Recently Had Any Thoughts About Hurting Yourself? No  Are You Planning to Commit Suicide/Harm Yourself At This time? No   Have you Recently Had Thoughts About [employer]? No  Explanation: No data recorded  Have You Used Any Alcohol or Drugs in the Past 24 Hours? Yes  How Long Ago Did You Use Drugs or Alcohol? No data recorded What Did You Use and How Much? Alcohol   Do You Currently Have a Therapist/Psychiatrist? No  Name of Therapist/Psychiatrist: No data recorded  Have You Been Recently Discharged From Any Office Practice or Programs? No  Explanation of Discharge From Practice/Program: No data recorded    CCA Screening Triage Referral Assessment Type of Contact: Face-to-Face  Is this Initial or Reassessment? No data recorded Date Telepsych consult ordered in [facility]:  No data recorded Time Telepsych consult ordered in [facility]:  No data recorded  Patient Reported Information Reviewed? Yes  Patient Left Without Being Seen? No data recorded Reason for Not Completing Assessment: No data recorded  Collateral Involvement: No data recorded  Does Patient Have a [place]? No data  recorded Name and Contact of Legal Guardian: No data recorded If Minor and Not Living with Parent(s), Who has Custody? No data recorded Is CPS involved or ever been involved? Never  Is APS involved or ever been involved?  Never   Patient Determined To Be At Risk for Harm To Self or Others Based on Review of Patient Reported Information or Presenting Complaint? No  Method: No data recorded Availability of Means: No data recorded Intent: No data recorded Notification Required: No data recorded Additional Information for Danger to Others Potential: No data recorded Additional Comments for Danger to Others Potential: No data recorded Are There Guns or Other Weapons in Your Home? No data recorded Types of Guns/Weapons: No data recorded Are These Weapons Safely Secured?                            No data recorded Who Could Verify You Are Able To Have These Secured: No data recorded Do You Have any Outstanding Charges, Pending Court Dates, Parole/Probation? No data recorded Contacted To Inform of Risk of Harm To Self or Others: No data recorded  Location of Assessment: [facility] ED   Does Patient Present under Involuntary Commitment? Yes  IVC Papers Initial File Date: 01/09/2020   [place] of Residence: [place]   Patient Currently Receiving the Following Services: No data recorded  Determination of Need: Emergent (2 hours)   Options For Referral: No data recorded    CCA Biopsychosocial Intake/Chief Complaint:  Intoxicated, SI  Current Symptoms/Problems: "Broken foot"   Patient Reported Schizophrenia/Schizoaffective Diagnosis in Past: No   Strengths: UTA  Preferences: UTA  Abilities: UTA   Type of Services Patient Feels are Needed: UTA   Initial Clinical Notes/Concerns: None   Mental Health Symptoms Depression:  Irritability   Duration of Depressive symptoms: Greater than two weeks   Mania:  None   Anxiety:   None   Psychosis:  None   Duration of Psychotic symptoms: No data recorded  Trauma:  None   Obsessions:  None   Compulsions:  None   Inattention:  None   Hyperactivity/Impulsivity:  N/A   Oppositional/Defiant Behaviors:  None   Emotional Irregularity:  None    Other Mood/Personality Symptoms:  No data recorded   Mental Status Exam Appearance and self-care  Stature:  Average   Weight:  Average weight   Clothing:  Disheveled   Grooming:  Neglected   Cosmetic use:  None   Posture/gait:  Normal   Motor activity:  Agitated   Sensorium  Attention:  Normal   Concentration:  Normal   Orientation:  X5   Recall/memory:  Normal   Affect and Mood  Affect:  Appropriate   Mood:  Angry; Irritable   Relating  Eye contact:  Avoided   Facial expression:  Angry   Attitude toward examiner:  Argumentative; Defensive   Thought and Language  Speech flow: Loud; Clear and Coherent   Thought content:  Appropriate to Mood and Circumstances   Preoccupation:  None   Hallucinations:  None   Organization:  No data recorded  [employer] of Knowledge:  Average   Intelligence:  Average   Abstraction:  Normal   Judgement:  Poor   Reality Testing:  Realistic   Insight:  Lacking   Decision Making:  Impulsive   Social Functioning  Social Maturity:  Isolates   Social Judgement:  Normal   Stress  Stressors:  Other (Comment)  Coping Ability:  Deficient supports   Skill Deficits:  None   Supports:  Support needed     Religion: Religion/Spirituality Are You A Religious Person?: No  Leisure/Recreation: Leisure / Recreation Do You Have Hobbies?: No  Exercise/Diet: Exercise/Diet Do You Exercise?: No Have You Gained or Lost A Significant Amount of Weight in the Past Six Months?: No Do You Follow a Special Diet?: No Do You Have Any Trouble Sleeping?: No   CCA Employment/Education Employment/Work Situation: Employment / Work Copywriter, advertising Employment situation: Unemployed Has patient ever been in the [employer]?: No  Education: Education Is Patient Currently Attending School?: No Did Teacher, adult education From [employer]?: No Did You Nutritional therapist?: No Did Heritage manager?: No Did You Have An  Individualized Education Program (IIEP): No Did You Have Any Difficulty At [employer]?: No Patient's Education Has Been Impacted by Current Illness: No   CCA Family/Childhood History Family and Relationship History: Family history Marital status: Single Are you sexually active?: No  Childhood History:     Child/Adolescent Assessment:     CCA Substance Use Alcohol/Drug Use: Alcohol / Drug Use Pain Medications: See MAR Prescriptions: See MAR Over the Counter: See MAR History of alcohol / drug use?: Yes Substance #1 Name of Substance 1: Alcohol                       ASAM's:  Six Dimensions of Multidimensional Assessment  Dimension 1:  Acute Intoxication and/or Withdrawal Potential:      Dimension 2:  Biomedical Conditions and Complications:      Dimension 3:  Emotional, Behavioral, or Cognitive Conditions and Complications:     Dimension 4:  Readiness to Change:     Dimension 5:  Relapse, Continued use, or Continued Problem Potential:     Dimension 6:  Recovery/Living Environment:     ASAM Severity Score:    ASAM Recommended Level of Treatment:     Substance use Disorder (SUD)    Recommendations for Services/Supports/Treatments:   Patient disposition pending   DSM5 Diagnoses: Patient Active Problem List   Diagnosis Date Noted  . GERD (gastroesophageal reflux disease) 03/30/2019  . Tobacco abuse 03/30/2019  . Abdominal pain 03/30/2019  . Syncope 03/30/2019  . PONV (postoperative nausea and vomiting) 11/07/2018  . Vaginal cuff cellulitis 11/04/2018  . Status post laparoscopic hysterectomy 10/28/2018  . Anticoagulated 10/04/2018  . Pelvic pain 10/04/2018  . 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 History of partial thyroidectomy 10/04/2018  . Symptomatic anemia 06/13/2018  . Swelling of limb 05/17/2018  . Essential hypertension 05/17/2018  . Pancreatitis, chronic ([place]) 05/17/2018  . Portal vein thrombosis 05/17/2018    Patient  Centered Plan: Patient is on the following Treatment Plan(s):  Depression and Substance Abuse   Referrals to Alternative Service(s): Referred to Alternative Service(s):   Place:   Date:   Time:    Referred to Alternative Service(s):   Place:   Date:   Time:    Referred to Alternative Service(s):   Place:   Date:   Time:    Referred to Alternative Service(s):   Place:   Date:   Time:     Lorisa Scheid A Esaul Dorwart, LCAS-A

## 2020-01-09 NOTE — ED Notes (Signed)
IVC  PT  MOVED  TO  RN 20  [place]

## 2020-01-09 NOTE — ED Notes (Signed)
Pt given dinner tray. Pt requesting pain meds. Notified provider [facility] via secure chat.

## 2020-01-09 NOTE — ED Notes (Addendum)
Patient woke up groggy and screaming in pain. She requested to get pain medication for her leg. EDP notified.

## 2020-01-09 NOTE — ED Notes (Signed)
EDP at bedside with vascular doppler.

## 2020-01-09 NOTE — ED Notes (Signed)
Pt in hospital stretcher. EDP and nurses at bedside.

## 2020-01-09 NOTE — ED Notes (Signed)
Report from [place] RN. Patient sleeping, respirations regular and unlabored. Q15 minute rounds and observation by Engineer, drilling to continue.

## 2020-01-09 NOTE — ED Notes (Signed)
L ankle being wrapped up with gauze/splint by EDP.

## 2020-01-09 NOTE — ED Notes (Signed)
Patient alert and oriented, warm and dry, in no acute distress. Patient denies SI, HI, AVH. Patient made aware of Q15 minute rounds and Engineer, drilling presence for their safety. Patient instructed to come to me with needs or concerns.

## 2020-01-09 NOTE — ED Triage Notes (Signed)
Pt to ED via [place]. Open ankle fracture to L ankle with visual deformity and bone protruding. Police at bedside. Pt handcuffed. Pt screaming. Pt saying that children not safe at home, "get that man out of my house". Per EMS, pt was stepping down front steps and heard a "crack". Ankle broke. EDP, nurses, paramedics and police at bedside.  Pt being given sedatives in order to move pt to hospital stretcher.

## 2020-01-10 MED ORDER — CEPHALEXIN 500 MG PO CAPS: 500.0000 mg | ORAL_CAPSULE | Freq: Two times a day (BID) | ORAL | 0 refills | Status: AC

## 2020-01-10 MED ORDER — OXYCODONE-ACETAMINOPHEN 5-325 MG PO TABS
1.0000 | ORAL_TABLET | ORAL | Status: DC | PRN
  Administered 2020-01-10 (×3): 1 via ORAL
  Filled 2020-01-10 (×3): qty 1

## 2020-01-10 MED ORDER — OXYCODONE-ACETAMINOPHEN 5-325 MG PO TABS: 1.0000 | ORAL_TABLET | ORAL | 0 refills | Status: DC | PRN

## 2020-01-10 MED ORDER — KETOROLAC TROMETHAMINE 30 MG/ML IJ SOLN
15.0000 mg | Freq: Once | INTRAMUSCULAR | Status: AC
  Administered 2020-01-10: 15 mg via INTRAVENOUS
  Filled 2020-01-10: qty 1

## 2020-01-10 MED ORDER — SULFAMETHOXAZOLE-TRIMETHOPRIM 800-160 MG PO TABS: 1.0000 | ORAL_TABLET | Freq: Two times a day (BID) | ORAL | 0 refills | Status: AC

## 2020-01-10 NOTE — ED Notes (Signed)
Patient offered to eat and refused. Beverage given.

## 2020-01-10 NOTE — Discharge Instructions (Addendum)
Keep your splint dry and clean.  Do not put any weight on your leg until cleared by orthopedics.  Call their office on Monday for close follow-up.  Use crutches for ambulation.  Take the antibiotics as prescribed.  You will need to have your stitches removed in 7 to 10 days.  Return to the emergency room if you notice redness around the stitching area, if he feels hot to the touch, if you develop fever or if you notice pus.  Also return if you have severe pain, changes in coloration of your toes including bluish or pale, or pins-and-needles sensation.

## 2020-01-10 NOTE — ED Notes (Signed)
Patient wants to know the care plan for her. EDP notified.

## 2020-01-10 NOTE — ED Notes (Signed)
Hourly rounding reveals patient in room. No complaints, stable, in no acute distress. Q15 minute rounds and monitoring via Rover and Officer to continue.   

## 2020-01-10 NOTE — ED Provider Notes (Signed)
Patient evaluated by Dr. Marin Shutter from psychiatry who lifted IVC and recommended discharge.  Per Dr. Jerene Canny note patient will be discharged with follow-up with Dr. Sabra Heck.  She was provided crutches, 10-day course of Bactrim and Keflex and a short course of Percocet for pain.  Discussed splint care at home and my standard return precautions.   Alfred Levins, [place], MD 01/10/20 [phone]

## 2020-01-10 NOTE — ED Notes (Signed)
Hourly rounding reveals patient in room. No complaints, stable, in no acute distress. Q15 minute monitoring via Engineer, drilling to continue.

## 2020-01-10 NOTE — ED Notes (Signed)
Patient c/o foot hurting.EDP notified.

## 2020-01-10 NOTE — ED Notes (Signed)
SOC complete/IVC rescinded/pending DC.

## 2020-01-10 NOTE — ED Notes (Signed)
VS not taken. Patient asleep

## 2020-01-10 NOTE — ED Notes (Signed)
Hourly rounding reveals patient in room. No complaints, stable, in no acute distress. Q15 minute monitoring via [place] and Officer to continue.  

## 2020-01-10 NOTE — ED Notes (Signed)
Pt discharged home with family. Discharge instructions reviewed with patient. Crutches provided with education.  VS stable. Pt denies SI/HI.

## 2020-01-14 ENCOUNTER — Observation Stay: Payer: Medicaid Other

## 2020-01-14 ENCOUNTER — Encounter: Payer: Self-pay | Admitting: Emergency Medicine

## 2020-01-14 ENCOUNTER — Inpatient Hospital Stay
Admission: EM | Admit: 2020-01-14 | Discharge: 2020-01-16 | DRG: 392 | Discharge status: Against Medical Advice | Payer: Medicaid Other | Attending: Internal Medicine | Admitting: Internal Medicine

## 2020-01-14 ENCOUNTER — Emergency Department: Payer: Medicaid Other

## 2020-01-14 ENCOUNTER — Other Ambulatory Visit: Payer: Self-pay

## 2020-01-14 DIAGNOSIS — M79605 Pain in left leg: Secondary | ICD-10-CM

## 2020-01-14 DIAGNOSIS — F12288 Cannabis dependence with other cannabis-induced disorder: Secondary | ICD-10-CM | POA: Diagnosis present

## 2020-01-14 DIAGNOSIS — F1220 Cannabis dependence, uncomplicated: Secondary | ICD-10-CM | POA: Diagnosis present

## 2020-01-14 DIAGNOSIS — Z90710 Acquired absence of both cervix and uterus: Secondary | ICD-10-CM

## 2020-01-14 DIAGNOSIS — I2510 Atherosclerotic heart disease of native coronary artery without angina pectoris: Secondary | ICD-10-CM | POA: Diagnosis present

## 2020-01-14 DIAGNOSIS — I10 Essential (primary) hypertension: Secondary | ICD-10-CM | POA: Diagnosis not present

## 2020-01-14 DIAGNOSIS — R112 Nausea with vomiting, unspecified: Secondary | ICD-10-CM | POA: Diagnosis not present

## 2020-01-14 DIAGNOSIS — Z720 Tobacco use: Secondary | ICD-10-CM | POA: Diagnosis present

## 2020-01-14 DIAGNOSIS — Z20822 Contact with and (suspected) exposure to covid-19: Secondary | ICD-10-CM | POA: Diagnosis present

## 2020-01-14 DIAGNOSIS — F1290 Cannabis use, unspecified, uncomplicated: Secondary | ICD-10-CM

## 2020-01-14 DIAGNOSIS — F17210 Nicotine dependence, cigarettes, uncomplicated: Secondary | ICD-10-CM | POA: Diagnosis present

## 2020-01-14 DIAGNOSIS — Z79899 Other long term (current) drug therapy: Secondary | ICD-10-CM

## 2020-01-14 DIAGNOSIS — K219 Gastro-esophageal reflux disease without esophagitis: Secondary | ICD-10-CM | POA: Diagnosis present

## 2020-01-14 DIAGNOSIS — Z7901 Long term (current) use of anticoagulants: Secondary | ICD-10-CM

## 2020-01-14 DIAGNOSIS — E669 Obesity, unspecified: Secondary | ICD-10-CM | POA: Diagnosis present

## 2020-01-14 DIAGNOSIS — Z6841 Body Mass Index (BMI) 40.0 and over, adult: Secondary | ICD-10-CM

## 2020-01-14 DIAGNOSIS — Z86718 Personal history of other venous thrombosis and embolism: Secondary | ICD-10-CM

## 2020-01-14 DIAGNOSIS — D72829 Elevated white blood cell count, unspecified: Secondary | ICD-10-CM

## 2020-01-14 DIAGNOSIS — K861 Other chronic pancreatitis: Secondary | ICD-10-CM | POA: Diagnosis present

## 2020-01-14 DIAGNOSIS — R1010 Upper abdominal pain, unspecified: Secondary | ICD-10-CM

## 2020-01-14 DIAGNOSIS — K860 Alcohol-induced chronic pancreatitis: Secondary | ICD-10-CM | POA: Diagnosis not present

## 2020-01-14 DIAGNOSIS — D72828 Other elevated white blood cell count: Secondary | ICD-10-CM | POA: Diagnosis present

## 2020-01-14 LAB — LIPASE, BLOOD: Lipase: 27 U/L (ref 11–51)

## 2020-01-14 LAB — COMPREHENSIVE METABOLIC PANEL
ALT: 26 U/L (ref 0–44)
ALT: 20 U/L (ref 0–44)
AST: 25 U/L (ref 15–41)
AST: 19 U/L (ref 15–41)
Albumin: 4.2 g/dL (ref 3.5–5.0)
Albumin: 3.9 g/dL (ref 3.5–5.0)
Alkaline Phosphatase: 86 U/L (ref 38–126)
Alkaline Phosphatase: 76 U/L (ref 38–126)
Anion gap: 15 (ref 5–15)
Anion gap: 13 (ref 5–15)
BUN: 14 mg/dL (ref 6–20)
BUN: 13 mg/dL (ref 6–20)
CO2: 25 mmol/L (ref 22–32)
CO2: 24 mmol/L (ref 22–32)
Calcium: 9.8 mg/dL (ref 8.9–10.3)
Calcium: 9.1 mg/dL (ref 8.9–10.3)
Chloride: 100 mmol/L (ref 98–111)
Chloride: 101 mmol/L (ref 98–111)
Creatinine, Ser: 0.69 mg/dL (ref 0.44–1.00)
Creatinine, Ser: 0.77 mg/dL (ref 0.44–1.00)
GFR, Estimated: >60 mL/min (ref >60)
GFR, Estimated: >60 mL/min (ref >60)
Glucose, Bld: 284 mg/dL — ABNORMAL HIGH (ref 70–99)
Glucose, Bld: 202 mg/dL — ABNORMAL HIGH (ref 70–99)
Potassium: 3.4 mmol/L — ABNORMAL LOW (ref 3.5–5.1)
Potassium: 3.6 mmol/L (ref 3.5–5.1)
Sodium: 140 mmol/L (ref 135–145)
Sodium: 138 mmol/L (ref 135–145)
Total Bilirubin: 0.8 mg/dL (ref 0.3–1.2)
Total Bilirubin: 0.6 mg/dL (ref 0.3–1.2)
Total Protein: 8.5 g/dL — ABNORMAL HIGH (ref 6.5–8.1)
Total Protein: 7.9 g/dL (ref 6.5–8.1)

## 2020-01-14 LAB — CBC WITH DIFFERENTIAL/PLATELET
Abs Immature Granulocytes: 0.07 10*3/uL (ref 0.00–0.07)
Basophils Absolute: 0 10*3/uL (ref 0.0–0.1)
Basophils Relative: 0 %
Eosinophils Absolute: 0 10*3/uL (ref 0.0–0.5)
Eosinophils Relative: 0 %
HCT: 46 % (ref 36.0–46.0)
Hemoglobin: 14.8 g/dL (ref 12.0–15.0)
Immature Granulocytes: 1 %
Lymphocytes Relative: 6 %
Lymphs Abs: 0.8 10*3/uL (ref 0.7–4.0)
MCH: 26.7 pg (ref 26.0–34.0)
MCHC: 32.2 g/dL (ref 30.0–36.0)
MCV: 82.9 fL (ref 80.0–100.0)
Monocytes Absolute: 0.4 10*3/uL (ref 0.1–1.0)
Monocytes Relative: 3 %
Neutro Abs: 11.9 10*3/uL — ABNORMAL HIGH (ref 1.7–7.7)
Neutrophils Relative %: 90 %
Platelets: 314 10*3/uL (ref 150–400)
RBC: 5.55 MIL/uL — ABNORMAL HIGH (ref 3.87–5.11)
RDW: 14.5 % (ref 11.5–15.5)
WBC: 13.3 10*3/uL — ABNORMAL HIGH (ref 4.0–10.5)
nRBC: 0 % (ref 0.0–0.2)

## 2020-01-14 LAB — CBC
HCT: 43.4 % (ref 36.0–46.0)
Hemoglobin: 14.4 g/dL (ref 12.0–15.0)
MCH: 27.3 pg (ref 26.0–34.0)
MCHC: 33.2 g/dL (ref 30.0–36.0)
MCV: 82.2 fL (ref 80.0–100.0)
Platelets: 254 10*3/uL (ref 150–400)
RBC: 5.28 MIL/uL — ABNORMAL HIGH (ref 3.87–5.11)
RDW: 14.7 % (ref 11.5–15.5)
WBC: 13.8 10*3/uL — ABNORMAL HIGH (ref 4.0–10.5)
nRBC: 0 % (ref 0.0–0.2)

## 2020-01-14 LAB — MAGNESIUM: Magnesium: 1.8 mg/dL (ref 1.7–2.4)

## 2020-01-14 LAB — ETHANOL: Alcohol, Ethyl (B): <10 mg/dL (ref <10)

## 2020-01-14 LAB — POC SARS CORONAVIRUS 2 AG -  ED: SARS Coronavirus 2 Ag: NEGATIVE

## 2020-01-14 LAB — PHOSPHORUS: Phosphorus: 3.4 mg/dL (ref 2.5–4.6)

## 2020-01-14 MED ORDER — DOCUSATE SODIUM 100 MG PO CAPS
100.0000 mg | ORAL_CAPSULE | Freq: Every day | ORAL | Status: DC
  Filled 2020-01-14: qty 1

## 2020-01-14 MED ORDER — LACTATED RINGERS IV SOLN
INTRAVENOUS | Status: AC
  Administered 2020-01-15: 150 mL via INTRAVENOUS

## 2020-01-14 MED ORDER — PANCRELIPASE (LIP-PROT-AMYL) 12000-38000 UNITS PO CPEP
72000.0000 [IU] | ORAL_CAPSULE | Freq: Three times a day (TID) | ORAL | Status: DC
  Filled 2020-01-14 (×2): qty 6

## 2020-01-14 MED ORDER — LORAZEPAM 2 MG/ML IJ SOLN
1.0000 mg | INTRAMUSCULAR | Status: DC | PRN
  Administered 2020-01-14 (×2): 2 mg via INTRAVENOUS
  Administered 2020-01-15: 1 mg via INTRAVENOUS
  Administered 2020-01-16: 2 mg via INTRAVENOUS
  Filled 2020-01-14 (×4): qty 1

## 2020-01-14 MED ORDER — RIVAROXABAN 20 MG PO TABS
20.0000 mg | ORAL_TABLET | Freq: Every day | ORAL | Status: DC
  Filled 2020-01-14 (×2): qty 1

## 2020-01-14 MED ORDER — SODIUM CHLORIDE 0.9 % IV BOLUS
1000.0000 mL | Freq: Once | INTRAVENOUS | Status: AC
  Administered 2020-01-14: 1000 mL via INTRAVENOUS

## 2020-01-14 MED ORDER — ACETAMINOPHEN 650 MG RE SUPP: 650.0000 mg | Freq: Four times a day (QID) | RECTAL | Status: DC | PRN

## 2020-01-14 MED ORDER — CEPHALEXIN 500 MG PO CAPS
500.0000 mg | ORAL_CAPSULE | Freq: Two times a day (BID) | ORAL | Status: DC
  Administered 2020-01-15: 500 mg via ORAL
  Filled 2020-01-14: qty 1

## 2020-01-14 MED ORDER — FENTANYL CITRATE (PF) 100 MCG/2ML IJ SOLN
25.0000 ug | INTRAMUSCULAR | Status: AC | PRN
  Administered 2020-01-15 (×3): 25 ug via INTRAVENOUS
  Filled 2020-01-14 (×3): qty 2

## 2020-01-14 MED ORDER — THIAMINE HCL 100 MG PO TABS: 100.0000 mg | ORAL_TABLET | Freq: Every day | ORAL | Status: DC

## 2020-01-14 MED ORDER — SULFAMETHOXAZOLE-TRIMETHOPRIM 800-160 MG PO TABS
1.0000 | ORAL_TABLET | Freq: Two times a day (BID) | ORAL | Status: DC
  Administered 2020-01-15: 1 via ORAL
  Filled 2020-01-14 (×2): qty 1

## 2020-01-14 MED ORDER — HALOPERIDOL LACTATE 5 MG/ML IJ SOLN
INTRAMUSCULAR | Status: AC
  Administered 2020-01-14: 5 mg via INTRAVENOUS
  Filled 2020-01-14: qty 1

## 2020-01-14 MED ORDER — MORPHINE SULFATE (PF) 4 MG/ML IV SOLN: 4.0000 mg | Freq: Once | INTRAVENOUS | Status: DC

## 2020-01-14 MED ORDER — ONDANSETRON HCL 4 MG/2ML IJ SOLN
4.0000 mg | Freq: Four times a day (QID) | INTRAMUSCULAR | Status: DC | PRN
  Filled 2020-01-14: qty 2

## 2020-01-14 MED ORDER — MORPHINE SULFATE (PF) 4 MG/ML IV SOLN
INTRAVENOUS | Status: AC
  Filled 2020-01-14: qty 1

## 2020-01-14 MED ORDER — ADULT MULTIVITAMIN W/MINERALS CH: 1.0000 | ORAL_TABLET | Freq: Every day | ORAL | Status: DC

## 2020-01-14 MED ORDER — AMLODIPINE BESYLATE 10 MG PO TABS
10.0000 mg | ORAL_TABLET | Freq: Every day | ORAL | Status: DC
  Administered 2020-01-15: 10 mg via ORAL
  Filled 2020-01-14: qty 1

## 2020-01-14 MED ORDER — ONDANSETRON HCL 4 MG/2ML IJ SOLN
4.0000 mg | Freq: Four times a day (QID) | INTRAMUSCULAR | Status: DC | PRN
  Administered 2020-01-14 – 2020-01-15 (×4): 4 mg via INTRAVENOUS
  Filled 2020-01-14 (×4): qty 2

## 2020-01-14 MED ORDER — PROMETHAZINE HCL 25 MG/ML IJ SOLN
12.5000 mg | Freq: Once | INTRAMUSCULAR | Status: AC
  Administered 2020-01-14: 12.5 mg via INTRAVENOUS
  Filled 2020-01-14: qty 1

## 2020-01-14 MED ORDER — ALUM & MAG HYDROXIDE-SIMETH 200-200-20 MG/5ML PO SUSP
30.0000 mL | Freq: Once | ORAL | Status: AC
  Administered 2020-01-14: 30 mL via ORAL
  Filled 2020-01-14: qty 30

## 2020-01-14 MED ORDER — MORPHINE SULFATE (PF) 4 MG/ML IV SOLN: 4.0000 mg | INTRAVENOUS | Status: DC

## 2020-01-14 MED ORDER — ACETAMINOPHEN 325 MG PO TABS
650.0000 mg | ORAL_TABLET | Freq: Four times a day (QID) | ORAL | Status: DC | PRN
  Administered 2020-01-16: 650 mg via ORAL
  Filled 2020-01-14: qty 2

## 2020-01-14 MED ORDER — DICYCLOMINE HCL 10 MG/5ML PO SOLN
10.0000 mg | Freq: Once | ORAL | Status: AC
  Administered 2020-01-14: 10 mg via ORAL
  Filled 2020-01-14: qty 5

## 2020-01-14 MED ORDER — LIDOCAINE VISCOUS HCL 2 % MT SOLN
15.0000 mL | Freq: Once | OROMUCOSAL | Status: AC
  Administered 2020-01-14: 15 mL via ORAL
  Filled 2020-01-14: qty 15

## 2020-01-14 MED ORDER — HALOPERIDOL LACTATE 5 MG/ML IJ SOLN: 5.0000 mg | Freq: Once | INTRAMUSCULAR | Status: AC

## 2020-01-14 MED ORDER — LORAZEPAM 1 MG PO TABS
1.0000 mg | ORAL_TABLET | ORAL | Status: DC | PRN
  Filled 2020-01-14: qty 1

## 2020-01-14 MED ORDER — IOHEXOL 300 MG/ML  SOLN
100.0000 mL | Freq: Once | INTRAMUSCULAR | Status: AC | PRN
  Administered 2020-01-14: 100 mL via INTRAVENOUS

## 2020-01-14 MED ORDER — ONDANSETRON HCL 4 MG PO TABS
4.0000 mg | ORAL_TABLET | Freq: Four times a day (QID) | ORAL | Status: DC | PRN
  Administered 2020-01-16: 4 mg via ORAL
  Filled 2020-01-14: qty 1

## 2020-01-14 MED ORDER — FOLIC ACID 1 MG PO TABS: 1.0000 mg | ORAL_TABLET | Freq: Every day | ORAL | Status: DC

## 2020-01-14 MED ORDER — THIAMINE HCL 100 MG/ML IJ SOLN: 100.0000 mg | Freq: Every day | INTRAMUSCULAR | Status: DC

## 2020-01-14 MED ORDER — LORAZEPAM 2 MG/ML IJ SOLN
1.0000 mg | Freq: Once | INTRAMUSCULAR | Status: AC
  Administered 2020-01-14: 1 mg via INTRAVENOUS
  Filled 2020-01-14: qty 1

## 2020-01-14 NOTE — Discharge Instructions (Addendum)
Continue taking your antibiotics until they are completely gone. You should follow-up with orthopedics within 1 week for continued evaluation of your leg.

## 2020-01-14 NOTE — ED Provider Notes (Signed)
-----------------------------------------   4:02 PM on 01/14/2020 -----------------------------------------  Patient reports she is still vomiting. Her belly still hurting and she has heartburn. I will give her a little bit of Phenergan IV and then some GI cocktail and some morphine. If this does not improve her problems we will CT her abdomen and then if she still vomiting we will get her in the hospital.   Nena Polio, MD 01/14/20 [phone]

## 2020-01-14 NOTE — ED Triage Notes (Signed)
Pt arrived via EMS from home with c/o pancreatitis. Per EMS, pt states she has not been able to tolerate PO intake since last night and felt that her pancreatitis was flaring up again.   Per EMS, pt started c/o muscle spasms en route. EMS gave pt 4 mg zofran at 0956.   Per EMS VS were 152/92, HR 100, 100% RA, and CBG 354.

## 2020-01-14 NOTE — ED Provider Notes (Signed)
[facility] Emergency Department Provider Note  ____________________________________________  Time seen: Approximately 10:29 AM  I have reviewed the triage vital signs and the nursing notes.   HISTORY  Chief Complaint Pancreatitis  Level 5 Caveat: Portions of the History and Physical including HPI and review of systems are unable to be completely obtained due to patient being a poor historian    HPI Trace [name] is a 45 y.o. female who with a history of hypertension pancreatitis and alcohol abuse who comes ED complaining of pancreatitis pain since yesterday.  She states that her whole body hurts, she has nausea and vomiting.   No diarrhea.  Denies fever or chest pain or shortness of breath.  She does admit to recent marijuana use.  Patient declines to provide any further details.  She was seen in the emergency department by myself 5 days ago where she arrived with alcohol intoxication and combativeness requiring immediate sedation.  She also had an open dislocation of her ankle which was emergently reduced and splinted at that time.  She stayed in the ED for roughly 24 hours at which point she was discharged when sober after being cleared by psychiatry.       Past Medical History:  Diagnosis Date  . Abdominal pain   . Anxiety   . Coronary artery disease   . Dyspnea   . GERD (gastroesophageal reflux disease)   . H/O blood clots   . Hypertension   . Liver abscess   . Pancreatitis   . PONV (postoperative nausea and vomiting) 11/07/2018  . Thyroid disease   . Uterine fibroid      Patient Active Problem List   Diagnosis Date Noted  . GERD (gastroesophageal reflux disease) 03/30/2019  . Tobacco abuse 03/30/2019  . Abdominal pain 03/30/2019  . Syncope 03/30/2019  . PONV (postoperative nausea and vomiting) 11/07/2018  . Vaginal cuff cellulitis 11/04/2018  . Status post laparoscopic hysterectomy 10/28/2018  . Anticoagulated 10/04/2018  . Pelvic  pain 10/04/2018  . 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 History of partial thyroidectomy 10/04/2018  . Symptomatic anemia 06/13/2018  . Swelling of limb 05/17/2018  . Essential hypertension 05/17/2018  . Pancreatitis, chronic ([place]) 05/17/2018  . Portal vein thrombosis 05/17/2018     Past Surgical History:  Procedure Laterality Date  . CESAREAN SECTION    . EMBOLIZATION N/A 06/14/2018   Procedure: Uterine Embolization;  Surgeon: Algernon Huxley, MD;  Location: [place] [facility];  Service: Cardiovascular;  Laterality: N/A;  . laceration finger Right   . LAPAROSCOPIC VAGINAL HYSTERECTOMY WITH SALPINGECTOMY Bilateral 10/28/2018   Procedure: LAPAROSCOPIC ASSISTED VAGINAL HYSTERECTOMY BILATERAL WITH SALPINGECTOMY;  Surgeon: Rubie Maid, MD;  Location: [facility];  Service: Gynecology;  Laterality: Bilateral;  . THYROIDECTOMY, PARTIAL    . VAGINAL HYSTERECTOMY Bilateral 10/28/2018   Procedure: HYSTERECTOMY VAGINAL WITH BILATERAL SALPINGECTOMY;  Surgeon: Rubie Maid, MD;  Location: [facility] OR[facility];  Service: Gynecology;  Laterality: Bilateral;     Prior to Admission medications   Medication Sig Start Date End Date Taking? Authorizing Provider  acetaminophen (TYLENOL 8 HOUR) 650 MG CR tablet Take 1 tablet (650 mg total) by mouth every 8 (eight) hours as needed for pain. 10/30/18   Rubie Maid, MD  amLODipine (NORVASC) 10 MG tablet Take 1 tablet (10 mg total) by mouth at bedtime. 05/13/19   Rubie Maid, MD  cephALEXin (KEFLEX) 500 MG capsule Take 1 capsule (500 mg total) by mouth 2 (two) times daily for 10 days.  01/10/20 01/20/20  Rudene Re, MD  CREON 36000 units CPEP capsule TAKE 2 CAPSULES BY MOUTH WITH EACH MEAL AND 1 CAPSULE WITH [facility] Patient taking differently: Take 36,000-72,000 Units by mouth See admin instructions. Take 2 capsules (72000u) by mouth three times daily with meals and take 1 capsule (36000u) by mouth twice daily with snacks 12/04/18   Lucilla Lame, MD  docusate sodium (COLACE) 100 MG capsule Take 1 capsule (100 mg total) by mouth 2 (two) times daily as needed. Patient taking differently: Take 100 mg by mouth daily.  10/04/18   Hubbard Hartshorn, FNP  Drospirenone (SLYND) 4 MG TABS Take 4 mg by mouth daily. 08/08/19   Rubie Maid, MD  ondansetron (ZOFRAN) 4 MG tablet Take 1 tablet (4 mg total) by mouth every 8 (eight) hours as needed for nausea or vomiting. 08/07/19   Rubie Maid, MD  oxyCODONE-acetaminophen (PERCOCET) 5-325 MG tablet Take 1 tablet by mouth every 4 (four) hours as needed. 01/10/20   Rudene Re, MD  pantoprazole (PROTONIX) 40 MG tablet Take 1 tablet (40 mg total) by mouth daily. 05/13/19   Rubie Maid, MD  sulfamethoxazole-trimethoprim (BACTRIM DS) 800-160 MG tablet Take 1 tablet by mouth 2 (two) times daily for 10 days. 01/10/20 01/20/20  Rudene Re, MD  traMADol (ULTRAM) 50 MG tablet Take 1 tablet (50 mg total) by mouth every 6 (six) hours as needed. 06/01/19 05/31/20  Nance Pear, MD  XARELTO 20 MG TABS tablet TAKE 1 TABLET BY MOUTH ONCE DAILY WITH SUPPER 10/22/19   Kris Hartmann, NP     Allergies Patient has no known allergies.   Family History  Problem Relation Age of Onset  . Hypertension Mother   . Diabetes Mother   . Cancer Father   . Cancer Paternal Grandmother   . Cancer Paternal Grandfather   . Aneurysm Brother     Social History Social History   Tobacco Use  . Smoking status: Current Every Day Smoker    Packs/day: 0.50    Years: 35.00    Pack years: 17.50    Types: Cigarettes  . Smokeless tobacco: Never Used  Vaping Use  . Vaping Use: Never used  Substance Use Topics  . Alcohol use: Not Currently  . Drug use: Yes    Types: Marijuana    Comment: daily    Review of Systems  Constitutional:   No fever or chills.  ENT:   No sore throat. No rhinorrhea. Cardiovascular:   No chest pain or syncope. Respiratory:   No dyspnea or cough. Gastrointestinal:   Positive for  abdominal pain and vomiting Musculoskeletal: Positive for right ankle pain All other systems reviewed and are negative except as documented above in ROS and HPI.  ____________________________________________   PHYSICAL EXAM:  VITAL SIGNS: ED Triage Vitals  Enc Vitals Group     BP --      Pulse Rate 01/14/20 1011 100     Resp 01/14/20 1011 20     Temp --      Temp src --      SpO2 01/14/20 1011 100 %     Weight 01/14/20 1021 259 lb 14.8 oz (117.9 kg)     Height 01/14/20 1021 5\' 6"  (1.676 m)     Head Circumference --      Peak Flow --      Pain Score 01/14/20 1021 10     Pain Loc --      Pain Edu? --  Excl. in [place]? --     Vital signs reviewed, nursing assessments reviewed.   Constitutional:   Alert and oriented. Non-toxic appearance. Eyes:   Conjunctivae are normal. EOMI. PERRL. ENT      Head:   Normocephalic and atraumatic.      Nose:   Wearing a mask.      Mouth/Throat:   Wearing a mask.      Neck:   No meningismus. Full ROM. Hematological/Lymphatic/Immunilogical:   No cervical lymphadenopathy. Cardiovascular:   RRR. Symmetric bilateral radial and DP pulses.  No murmurs. Cap refill less than 2 seconds. Respiratory:   Normal respiratory effort without tachypnea/retractions. Breath sounds are clear and equal bilaterally. No wheezes/rales/rhonchi. Gastrointestinal:   Soft and nontender. Non distended. There is no CVA tenderness.  No rebound, rigidity, or guarding.  Musculoskeletal:   Normal range of motion in all extremities. No joint effusions.  No lower extremity tenderness.  No edema. Neurologic:   Normal speech and language.  Motor grossly intact. No acute focal neurologic deficits are appreciated.  Skin:    Skin is warm, dry and intact. No rash noted.  No petechiae, purpura, or bullae.  ____________________________________________    LABS (pertinent positives/negatives) (all labs ordered are listed, but only abnormal results are displayed) Labs Reviewed   COMPREHENSIVE METABOLIC PANEL - Abnormal; Notable for the following components:      Result Value   Potassium 3.4 (*)    Glucose, Bld 284 (*)    Total Protein 8.5 (*)    All other components within normal limits  CBC WITH DIFFERENTIAL/PLATELET - Abnormal; Notable for the following components:   WBC 13.3 (*)    RBC 5.55 (*)    Neutro Abs 11.9 (*)    All other components within normal limits  ETHANOL  LIPASE, BLOOD  URINALYSIS, COMPLETE (UACMP) WITH MICROSCOPIC  URINE DRUG SCREEN, QUALITATIVE ([facility] ONLY)  POC SARS CORONAVIRUS 2 AG -  ED   ____________________________________________   EKG    ____________________________________________    RADIOLOGY  No results found.  ____________________________________________   PROCEDURES Procedures  ____________________________________________  DIFFERENTIAL DIAGNOSIS   Pancreatitis, cannabinoid hyperemesis, alcoholic gastritis, anxiety, COVID  CLINICAL IMPRESSION / ASSESSMENT AND PLAN / ED COURSE  Medications ordered in the ED: Medications  morphine 4 MG/ML injection (0 mg  Hold 01/14/20 1029)  sodium chloride 0.9 % bolus 1,000 mL (0 mLs Intravenous Stopped 01/14/20 1423)  haloperidol lactate (HALDOL) injection 5 mg (5 mg Intravenous Given 01/14/20 1029)  LORazepam (ATIVAN) injection 1 mg (1 mg Intravenous Given 01/14/20 1210)    Pertinent labs & imaging results that were available during my care of the patient were reviewed by me and considered in my medical decision making (see chart for details).  Amy Wolfe was evaluated in Emergency Department on 01/14/2020 for the symptoms described in the history of present illness. She was evaluated in the context of the global COVID-19 pandemic, which necessitated consideration that the patient might be at risk for infection with the SARS-CoV-2 virus that causes COVID-19. Institutional protocols and algorithms that pertain to the evaluation of patients at risk for COVID-19 are in a  state of rapid change based on information released by regulatory bodies including the CDC and federal and state organizations. These policies and algorithms were followed during the patient's care in the ED.   Patient presents with generalized body pain and "pancreatitis pain."  She is already received Zofran by EMS but still retching and vomiting small amounts..  We will give  a dose of IV Haldol for nausea affect and to help calm her.  We will give IV fluids for hydration, check labs and COVID test.  When she is calmer we will take down the splint on her left lower extremity to evaluate her previous injury and wound healing.  Clinical Course as of 01/14/20 1525  Wed Jan 14, 2020  1523 Work-up reassuring. Once patient is awake, is tolerating oral intake she can be discharged home. [PS]    Clinical Course User Index [PS] Carrie Mew, MD     ----------------------------------------- 3:24 PM on 01/14/2020 -----------------------------------------  Patient has been sleeping comfortably in ED since receiving morphine and Ativan. Splint removed to reexamine left leg laceration just above the lateral malleolus. Healing well, no inflammatory changes, no induration or drainage or warmth or erythema. No apparent tenderness. Compartments are soft, there is no lower extremity swelling. Good distal perfusion.  ____________________________________________   FINAL CLINICAL IMPRESSION(S) / ED DIAGNOSES    Final diagnoses:  Cannabinoid hyperemesis syndrome     ED Discharge Orders    None      Portions of this note were generated with dragon dictation software. Dictation errors may occur despite best attempts at proofreading.   Carrie Mew, MD 01/14/20 1525

## 2020-01-14 NOTE — H&P (Signed)
History and Physical   Amy Wolfe VQM:[id number] DOB: 06-15-1975 DOA: 01/14/2020  PCP: Hubbard Hartshorn, FNP  Patient coming from: Home  I have personally briefly reviewed patient's old medical records in [place].  Chief Concern: Abdominal pain  HPI: Amy Wolfe is a 45 y.o. female with medical history significant for history of portal vein thrombosis on xarelto, chronic pancreatitis, history of fracture/open ankle dislocation, hypertension, GERD, obesity, CAD, alcohol abuse presented to the emergency department for chief concerns of abdominal pain.  She states the abdominal pain started Sunday at rest, 12/10, and it was in her upper abdomen.   She states her last drink was Friday.  She reports he does not drink regularly.  At bedside, patient is difficult to obtain H&P and minimally participates in encounter.  When I lightly palpated her left lower extremity due to cast unraveling she yelled at me and yanked her foot away reporting of pain.  However upon reminding her that I need to complete the physical exam as part of the H&P, same amount of pressure did not elicit pain or yelling from her.  On physical exam, there is some suture wounds on the plantar surface that was negative for visual evidence of infection.  Social history: lives at home with her boyfriend. She endorses tobacco use and etoh use. She denies recreational drug use. She reports she lasted used marijuana this AM.  ROS: Constitutional: no weight change, no fever ENT/Mouth: no sore throat, no rhinorrhea Eyes: no eye pain, no vision changes Cardiovascular: no chest pain, no dyspnea,  no edema, no palpitations Respiratory: no cough, no sputum, no wheezing Gastrointestinal: no nausea, no vomiting, no diarrhea, no constipation Genitourinary: no urinary incontinence, no dysuria, no hematuria Musculoskeletal: no arthralgias, no myalgias Skin: no skin lesions, no pruritus, Neuro: + weakness, no loss of  consciousness, no syncope Psych: no anxiety, no depression, + decrease appetite Heme/Lymph: no bruising, no bleeding  ED Course: Discussed patient case with ED provider, patient requiring hospitalization due to intractable nausea and vomiting.  Assessment/Plan  Active Problems:   Intractable vomiting with nausea   Intractable nausea and vomiting -Supportive care with ondansetron  Chronic pancreatitis- fentanyl 25 mcg prn for severe pain, LR 150 cc/h  Left foot dislocation with open wounds status post suture per ED provider on 01/09/2020 - Continue recommended Keflex 500 mg p.o. twice daily and Bactrim twice daily  Leukocytosis-etiology unclear at this time, blood cultures x2 ordered, UA ordered  History of alcohol abuse-CIWA protocol -Attempted counseling has been given however patient does not want to participate in physical exam, encounter, and is at times belligerent  Chart reviewed.   DVT prophylaxis: On Xarelto 20 mg nightly with supper Code Status: Full code Diet: Advance as tolerated Family Communication: No Disposition Plan: Pending clinical course Consults called: None at this time Admission status: Observation with telemetry  Past Medical History:  Diagnosis Date  . Abdominal pain   . Anxiety   . Coronary artery disease   . Dyspnea   . GERD (gastroesophageal reflux disease)   . H/O blood clots   . Hypertension   . Liver abscess   . Pancreatitis   . PONV (postoperative nausea and vomiting) 11/07/2018  . Thyroid disease   . Uterine fibroid    Past Surgical History:  Procedure Laterality Date  . CESAREAN SECTION    . EMBOLIZATION N/A 06/14/2018   Procedure: Uterine Embolization;  Surgeon: Algernon Huxley, MD;  Location: [place] [facility];  Service:  Cardiovascular;  Laterality: N/A;  . laceration finger Right   . LAPAROSCOPIC VAGINAL HYSTERECTOMY WITH SALPINGECTOMY Bilateral 10/28/2018   Procedure: LAPAROSCOPIC ASSISTED VAGINAL HYSTERECTOMY BILATERAL WITH  SALPINGECTOMY;  Surgeon: Rubie Maid, MD;  Location: [facility];  Service: Gynecology;  Laterality: Bilateral;  . THYROIDECTOMY, PARTIAL    . VAGINAL HYSTERECTOMY Bilateral 10/28/2018   Procedure: HYSTERECTOMY VAGINAL WITH BILATERAL SALPINGECTOMY;  Surgeon: Rubie Maid, MD;  Location: [facility];  Service: Gynecology;  Laterality: Bilateral;   Social History:  reports that she has been smoking cigarettes. She has a 17.50 pack-year smoking history. She has never used smokeless tobacco. She reports previous alcohol use. She reports current drug use. Drug: Marijuana.  No Known Allergies Family History  Problem Relation Age of Onset  . Hypertension Mother   . Diabetes Mother   . Cancer Father   . Cancer Paternal Grandmother   . Cancer Paternal Grandfather   . Aneurysm Brother    Family history: Family history reviewed and not pertinent  Prior to Admission medications   Medication Sig Start Date End Date Taking? Authorizing Provider  acetaminophen (TYLENOL 8 HOUR) 650 MG CR tablet Take 1 tablet (650 mg total) by mouth every 8 (eight) hours as needed for pain. 10/30/18   Rubie Maid, MD  amLODipine (NORVASC) 10 MG tablet Take 1 tablet (10 mg total) by mouth at bedtime. 05/13/19   Rubie Maid, MD  cephALEXin (KEFLEX) 500 MG capsule Take 1 capsule (500 mg total) by mouth 2 (two) times daily for 10 days. 01/10/20 01/20/20  Rudene Re, MD  CREON 36000 units CPEP capsule TAKE 2 CAPSULES BY MOUTH WITH EACH MEAL AND 1 CAPSULE WITH [facility] Patient taking differently: Take 36,000-72,000 Units by mouth See admin instructions. Take 2 capsules (72000u) by mouth three times daily with meals and take 1 capsule (36000u) by mouth twice daily with snacks 12/04/18   Lucilla Lame, MD  docusate sodium (COLACE) 100 MG capsule Take 1 capsule (100 mg total) by mouth 2 (two) times daily as needed. Patient taking differently: Take 100 mg by mouth daily.  10/04/18   Hubbard Hartshorn, FNP  Drospirenone (SLYND) 4  MG TABS Take 4 mg by mouth daily. 08/08/19   Rubie Maid, MD  ondansetron (ZOFRAN) 4 MG tablet Take 1 tablet (4 mg total) by mouth every 8 (eight) hours as needed for nausea or vomiting. 08/07/19   Rubie Maid, MD  oxyCODONE-acetaminophen (PERCOCET) 5-325 MG tablet Take 1 tablet by mouth every 4 (four) hours as needed. 01/10/20   Rudene Re, MD  pantoprazole (PROTONIX) 40 MG tablet Take 1 tablet (40 mg total) by mouth daily. 05/13/19   Rubie Maid, MD  sulfamethoxazole-trimethoprim (BACTRIM DS) 800-160 MG tablet Take 1 tablet by mouth 2 (two) times daily for 10 days. 01/10/20 01/20/20  Rudene Re, MD  traMADol (ULTRAM) 50 MG tablet Take 1 tablet (50 mg total) by mouth every 6 (six) hours as needed. 06/01/19 05/31/20  Nance Pear, MD  XARELTO 20 MG TABS tablet TAKE 1 TABLET BY MOUTH ONCE DAILY WITH SUPPER 10/22/19   Kris Hartmann, NP   Physical Exam: Vitals:   01/14/20 1316 01/14/20 1423 01/14/20 1606 01/14/20 1810  BP: 120/74 119/77 120/83 125/77  Pulse: 94 95 99 99  Resp: (!) 22 (!) 25 18 18   Temp: 98.1 F (36.7 C)     TempSrc: Oral     SpO2: 98% 97% 99% 99%  Weight:      Height:  Constitutional: appears to the chronological age, NAD, calm, comfortable Eyes: PERRL, lids and conjunctivae normal ENMT: Mucous membranes are moist. Posterior pharynx clear of any exudate or lesions. Age-appropriate dentition. Hearing appropriate Neck: normal, supple, no masses, no thyromegaly Respiratory: clear to auscultation bilaterally, no wheezing, no crackles. Normal respiratory effort. No accessory muscle use.  Cardiovascular: Regular rate and rhythm, no murmurs / rubs / gallops. No extremity edema. 2+ pedal pulses. No carotid bruits.  Abdomen: Obese abdomen, no tenderness, no masses palpated, no hepatosplenomegaly. Bowel sounds positive.  Musculoskeletal: no clubbing / cyanosis. No joint deformity upper and lower extremities. Good ROM, no contractures, no atrophy. Normal muscle  tone.  Skin: no rashes, lesions, ulcers. No induration.  Left plantar wounds present on admission Neurologic: Sensation intact. Strength 5/5 in all 4.  Psychiatric:  Alert and oriented x 3.  Agitated mood and flat affect  EKG: independently reviewed, showing sinus rhythm, rate of 85, QTc 539  Chest x-ray on Admission: I personally reviewed and I agree with radiologist reading as below.  CT ABDOMEN PELVIS W CONTRAST  Result Date: 01/14/2020 CLINICAL DATA:  Abdominal pain, history of pancreatitis EXAM: CT ABDOMEN AND PELVIS WITH CONTRAST TECHNIQUE: Multidetector CT imaging of the abdomen and pelvis was performed using the standard protocol following bolus administration of intravenous contrast. CONTRAST:  190mL OMNIPAQUE IOHEXOL 300 MG/ML  SOLN COMPARISON:  03/30/2019 FINDINGS: Lower chest: No acute abnormality. Hepatobiliary: No focal liver abnormality is seen. No gallstones, gallbladder wall thickening, or intrahepatic biliary dilatation. New 8 mm rounded area of low density along the course of the distal common bile duct. Pancreas: Multiple calcifications are again identified within the head and uncinate process and appears slightly increased. Dilatation of the main pancreatic duct may be slightly increased. No significant peripancreatic infiltration. Spleen: Unremarkable. Adrenals/Urinary Tract: Adrenals, kidneys, and bladder are unremarkable Stomach/Bowel: Small hiatal hernia. Stomach is otherwise within normal limits. Bowel is normal in caliber. Distal colonic diverticulosis. Normal appendix. Vascular/Lymphatic: Multiple collateral vessels at the porta hepatis. Aorta is unremarkable. No enlarged lymph nodes. Reproductive: Post hysterectomy.  No adnexal lesion. Other: No free fluid.  No acute abnormality of the abdominal wall. Musculoskeletal: No acute osseous abnormality. IMPRESSION: Persistent findings of chronic pancreatitis. Slightly increased calcification in the pancreatic head/uncinate and  possible slight increase in dilatation of the main pancreatic duct. No surrounding inflammatory changes to suggest superimposed acute pancreatitis. New 8 mm rounded area of low-density along the course of the distal common bile duct. May reflect focal duct dilatation or cystic lesion of pancreas. 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 Electronically Signed   By: Macy Mis M.D.   On: 01/14/2020 17:27   Labs on Admission: I have personally reviewed following labs  CBC: Recent Labs  Lab 01/09/20 1456 01/14/20 1023  WBC 7.1 13.3*  NEUTROABS 5.5 11.9*  HGB 16.8* 14.8  HCT 53.4* 46.0  MCV 85.0 82.9  PLT 244 546   Basic Metabolic Panel: Recent Labs  Lab 01/09/20 1456 01/14/20 1023  NA 137 140  K 3.0* 3.4*  CL 102 100  CO2 19* 25  GLUCOSE 126* 284*  BUN 9 14  CREATININE 0.58 0.69  CALCIUM 9.2 9.8   GFR: Estimated Creatinine Clearance: 119.6 mL/min (by C-G formula based on SCr of 0.69 mg/dL). Liver Function Tests: Recent Labs  Lab 01/09/20 1456 01/14/20 1023  AST 30 25  ALT 31 26  ALKPHOS 81 86  BILITOT 0.8 0.8  PROT 8.3* 8.5*  ALBUMIN 4.3 4.2  Recent Labs  Lab 01/14/20 1023  LIPASE 27   Urine analysis:    Component Value Date/Time   COLORURINE YELLOW (A) 01/09/2020 2138   APPEARANCEUR CLOUDY (A) 01/09/2020 2138   LABSPEC 1.020 01/09/2020 2138   PHURINE 5.0 01/09/2020 2138   GLUCOSEU NEGATIVE 01/09/2020 2138   HGBUR MODERATE (A) 01/09/2020 2138   BILIRUBINUR NEGATIVE 01/09/2020 2138   KETONESUR 5 (A) 01/09/2020 2138   PROTEINUR NEGATIVE 01/09/2020 2138   NITRITE NEGATIVE 01/09/2020 2138   LEUKOCYTESUR NEGATIVE 01/09/2020 2138   Caris Cerveny N Bhavya Eschete D.O. [facility]  If 7PM-7AM, please contact overnight-coverage provider If 7AM-7PM, please contact day coverage provider [email or web address]  01/14/2020, 7:09 PM

## 2020-01-14 NOTE — ED Notes (Signed)
Peri wick placed

## 2020-01-14 NOTE — ED Provider Notes (Addendum)
Pt still vomiting still has pain, will have to admit till she can tolerate po   Nena Polio, MD 01/14/20 Bosie Helper  GI feels that the CT finding could be a pancreatic cyst is difficult to tell.    Nena Polio, MD 01/14/20 1840

## 2020-01-15 DIAGNOSIS — I2510 Atherosclerotic heart disease of native coronary artery without angina pectoris: Secondary | ICD-10-CM | POA: Diagnosis present

## 2020-01-15 DIAGNOSIS — Z90710 Acquired absence of both cervix and uterus: Secondary | ICD-10-CM | POA: Diagnosis not present

## 2020-01-15 DIAGNOSIS — Z6841 Body Mass Index (BMI) 40.0 and over, adult: Secondary | ICD-10-CM | POA: Diagnosis not present

## 2020-01-15 DIAGNOSIS — R112 Nausea with vomiting, unspecified: Secondary | ICD-10-CM | POA: Diagnosis not present

## 2020-01-15 DIAGNOSIS — Z7901 Long term (current) use of anticoagulants: Secondary | ICD-10-CM | POA: Diagnosis not present

## 2020-01-15 DIAGNOSIS — F12288 Cannabis dependence with other cannabis-induced disorder: Secondary | ICD-10-CM | POA: Diagnosis present

## 2020-01-15 DIAGNOSIS — Z20822 Contact with and (suspected) exposure to covid-19: Secondary | ICD-10-CM | POA: Diagnosis present

## 2020-01-15 DIAGNOSIS — Z79899 Other long term (current) drug therapy: Secondary | ICD-10-CM | POA: Diagnosis not present

## 2020-01-15 DIAGNOSIS — F1220 Cannabis dependence, uncomplicated: Secondary | ICD-10-CM | POA: Diagnosis present

## 2020-01-15 DIAGNOSIS — E669 Obesity, unspecified: Secondary | ICD-10-CM | POA: Diagnosis present

## 2020-01-15 DIAGNOSIS — I10 Essential (primary) hypertension: Secondary | ICD-10-CM | POA: Diagnosis not present

## 2020-01-15 DIAGNOSIS — K219 Gastro-esophageal reflux disease without esophagitis: Secondary | ICD-10-CM | POA: Diagnosis present

## 2020-01-15 DIAGNOSIS — F17210 Nicotine dependence, cigarettes, uncomplicated: Secondary | ICD-10-CM | POA: Diagnosis present

## 2020-01-15 DIAGNOSIS — D72828 Other elevated white blood cell count: Secondary | ICD-10-CM | POA: Diagnosis present

## 2020-01-15 DIAGNOSIS — K861 Other chronic pancreatitis: Secondary | ICD-10-CM | POA: Diagnosis present

## 2020-01-15 DIAGNOSIS — Z86718 Personal history of other venous thrombosis and embolism: Secondary | ICD-10-CM | POA: Diagnosis not present

## 2020-01-15 LAB — BASIC METABOLIC PANEL
Anion gap: 10 (ref 5–15)
BUN: 14 mg/dL (ref 6–20)
CO2: 30 mmol/L (ref 22–32)
Calcium: 9.3 mg/dL (ref 8.9–10.3)
Chloride: 99 mmol/L (ref 98–111)
Creatinine, Ser: 0.7 mg/dL (ref 0.44–1.00)
GFR, Estimated: >60 mL/min (ref >60)
Glucose, Bld: 175 mg/dL — ABNORMAL HIGH (ref 70–99)
Potassium: 3.3 mmol/L — ABNORMAL LOW (ref 3.5–5.1)
Sodium: 139 mmol/L (ref 135–145)

## 2020-01-15 LAB — CBC
HCT: 43.5 % (ref 36.0–46.0)
Hemoglobin: 13.8 g/dL (ref 12.0–15.0)
MCH: 26.4 pg (ref 26.0–34.0)
MCHC: 31.7 g/dL (ref 30.0–36.0)
MCV: 83.3 fL (ref 80.0–100.0)
Platelets: 297 10*3/uL (ref 150–400)
RBC: 5.22 MIL/uL — ABNORMAL HIGH (ref 3.87–5.11)
RDW: 14.8 % (ref 11.5–15.5)
WBC: 15.2 10*3/uL — ABNORMAL HIGH (ref 4.0–10.5)
nRBC: 0 % (ref 0.0–0.2)

## 2020-01-15 MED ORDER — MORPHINE SULFATE (PF) 2 MG/ML IV SOLN
1.0000 mg | INTRAVENOUS | Status: AC | PRN
  Administered 2020-01-15 (×2): 1 mg via INTRAVENOUS
  Filled 2020-01-15 (×2): qty 1

## 2020-01-15 MED ORDER — CAPSAICIN 0.025 % EX CREA
TOPICAL_CREAM | Freq: Two times a day (BID) | CUTANEOUS | Status: DC
  Filled 2020-01-15 (×2): qty 60

## 2020-01-15 MED ORDER — PROCHLORPERAZINE EDISYLATE 10 MG/2ML IJ SOLN
10.0000 mg | INTRAMUSCULAR | Status: DC | PRN
  Administered 2020-01-15 (×2): 10 mg via INTRAVENOUS
  Filled 2020-01-15 (×3): qty 2

## 2020-01-15 MED ORDER — PROMETHAZINE HCL 25 MG/ML IJ SOLN
25.0000 mg | Freq: Four times a day (QID) | INTRAMUSCULAR | Status: DC | PRN
  Administered 2020-01-15 – 2020-01-16 (×2): 25 mg via INTRAVENOUS
  Filled 2020-01-15 (×2): qty 1

## 2020-01-15 MED ORDER — PANCRELIPASE (LIP-PROT-AMYL) 12000-38000 UNITS PO CPEP
36000.0000 [IU] | ORAL_CAPSULE | Freq: Two times a day (BID) | ORAL | Status: DC | PRN
  Filled 2020-01-15: qty 3

## 2020-01-15 NOTE — Progress Notes (Signed)
Pt. Was walking from bathroom to her bed and lost her balance. Pt leaned backward on the bathroom door and lowered herself to the floor. Pt had one nonskid sock on right foot and no sock on left. Was using a walker and hoping on right foot. ROM WNL. Placed back in bed. Pt c/o pain and nausea. Pt vomited clear liquid fluid. Gave IV pain medication and Zofran for nausea. Placed bed in low potion and call Provider . NO for tele monitor for safety. Started tele monitor. In bed resting. Will continue to monitor.

## 2020-01-15 NOTE — Progress Notes (Signed)
Pt has refused to have lab drawl her blood x 2. Said come back later. Pt got out of bed and went to shower. Pt would not cooperate with nurse to get back in bed. Pt stated she needs to take shower to get warm. After shower, pt walked with assistance x1 ans walker back to bed. Pt stated she just wants to go home. When asked about going home, pt reply with I have no ride. Pt was sitting in bed brushing her teeth. Call light within reach, bed in low potion, bed alarm on. Will continue to monitor.

## 2020-01-15 NOTE — Progress Notes (Signed)
PROGRESS NOTE    Amy Wolfe  [id number] DOB: 1975/06/09 DOA: 01/14/2020 PCP: Hubbard Hartshorn, FNP    Brief Narrative:  45 y.o. female with medical history significant for history of portal vein thrombosis on xarelto, chronic pancreatitis, history of fracture/open ankle dislocation, hypertension, GERD, obesity, CAD, alcohol abuse presented to the emergency department for chief concerns of abdominal pain.  She states the abdominal pain started "Sunday at rest, 12/10, and it was in her upper abdomen.   She states her last drink was Friday.  She reports he does not drink regularly.  At bedside, patient is difficult to obtain H&P and minimally participates in encounter.  When I lightly palpated her left lower extremity due to cast unraveling she yelled at me and yanked her foot away reporting of pain.  However upon reminding her that I need to complete the physical exam as part of the H&P, same amount of pressure did not elicit pain or yelling from her.  On physical exam, there is some suture wounds on the plantar surface that was negative for visual evidence of infection.  Social history: lives at home with her boyfriend. She endorses tobacco use and etoh use. She denies recreational drug use. She reports she lasted used marijuana on the morning of admission  Assessment & Plan:   Active Problems:   Essential hypertension   Pancreatitis, chronic (HCC)   GERD (gastroesophageal reflux disease)   Tobacco abuse   Intractable vomiting with nausea   Intractable nausea and vomiting  -Supportive care with ondansetron and phenergan as needed -still symptomatic with intractable vomiting throughout the day today -Advise hot shower. Have ordered trial of capsasin cream for symptom relief  Chronic pancreatitis - continue with morphine severe pain - continue with hydration as tolerated  Left foot dislocation with open wounds status post suture per ED provider on 01/09/2020 - Continue on  Keflex 500 mg p.o. twice daily and Bactrim twice daily  Leukocytosis -etiology unclear at this time, blood cultures x2 ordered, pending  History of alcohol abuse-CIWA protocol -Will require cessation  DVT prophylaxis: SCD's Code Status: Full Family Communication: Pt in room, family not at bedside  Status is: Observation  The patient will require care spanning > 2 midnights and should be moved to inpatient because: Ongoing active pain requiring inpatient pain management, IV treatments appropriate due to intensity of illness or inability to take PO and Inpatient level of care appropriate due to severity of illness  Dispo: The patient is from: Home              Anticipated d/c is to: Home              Anticipated d/c date is: 2 days              Patient currently is not medically stable to d/c.   Consultants:     Procedures:     Antimicrobials: Anti-infectives (From admission, onward)   Start     Dose/Rate Route Frequency Ordered Stop   01/15/20 1000  cephALEXin (KEFLEX) capsule 500 mg        500 mg Oral 2 times daily 01/14/20 1930     01" /13/22 1000  sulfamethoxazole-trimethoprim (BACTRIM DS) 800-160 MG per tablet 1 tablet        1 tablet Oral 2 times daily 01/14/20 1930         Subjective: Complaining of nausea, abd pain  Objective: Vitals:   01/15/20 0749 01/15/20 1300 01/15/20 1500 01/15/20 1642  BP: (!) 168/112 (!) 161/99 (!) 151/95 (!) 146/107  Pulse: 96 66 81 (!) 107  Resp: 20 18 16 18   Temp: 99.1 F (37.3 C)   (!) 97.5 F (36.4 C)  TempSrc: Oral     SpO2: 99% 99% 94% 97%  Weight:      Height:       No intake or output data in the 24 hours ending 01/15/20 1823 Filed Weights   01/14/20 1021  Weight: 117.9 kg    Examination:  General exam: Appears calm and uncomfortable appearing, actively vomiting Respiratory system: Clear to auscultation. Respiratory effort normal. Cardiovascular system: S1 & S2 heard, Regular Gastrointestinal system: Abdomen  is nondistended, soft generally tender Central nervous system: Alert and oriented. No focal neurological deficits. Extremities: Symmetric 5 x 5 power. Skin: No rashes, lesions Psychiatry: Judgement and insight appear normal. Mood & affect appropriate.   Data Reviewed: I have personally reviewed following labs and imaging studies  CBC: Recent Labs  Lab 01/09/20 1456 01/14/20 1023 01/14/20 1922 01/15/20 0603  WBC 7.1 13.3* 13.8* 15.2*  NEUTROABS 5.5 11.9*  --   --   HGB 16.8* 14.8 14.4 13.8  HCT 53.4* 46.0 43.4 43.5  MCV 85.0 82.9 82.2 83.3  PLT 244 314 254 [id number]   Basic Metabolic Panel: Recent Labs  Lab 01/09/20 1456 01/14/20 1023 01/14/20 1922 01/15/20 0603  NA 137 140 138 139  K 3.0* 3.4* 3.6 3.3*  CL 102 100 101 99  CO2 19* 25 24 30   GLUCOSE 126* 284* 202* 175*  BUN 9 14 13 14   CREATININE 0.58 0.69 0.77 0.70  CALCIUM 9.2 9.8 9.1 9.3  MG  --   --  1.8  --   PHOS  --   --  3.4  --    GFR: Estimated Creatinine Clearance: 119.6 mL/min (by C-G formula based on SCr of 0.7 mg/dL). Liver Function Tests: Recent Labs  Lab 01/09/20 1456 01/14/20 1023 01/14/20 1922  AST 30 25 19   ALT 31 26 20   ALKPHOS 81 86 76  BILITOT 0.8 0.8 0.6  PROT 8.3* 8.5* 7.9  ALBUMIN 4.3 4.2 3.9   Recent Labs  Lab 01/14/20 1023  LIPASE 27   No results for input(s): AMMONIA in the last 168 hours. Coagulation Profile: No results for input(s): INR, PROTIME in the last 168 hours. Cardiac Enzymes: No results for input(s): CKTOTAL, CKMB, CKMBINDEX, TROPONINI in the last 168 hours. BNP (last 3 results) No results for input(s): PROBNP in the last 8760 hours. HbA1C: No results for input(s): HGBA1C in the last 72 hours. CBG: No results for input(s): GLUCAP in the last 168 hours. Lipid Profile: No results for input(s): CHOL, HDL, LDLCALC, TRIG, CHOLHDL, LDLDIRECT in the last 72 hours. Thyroid Function Tests: No results for input(s): TSH, T4TOTAL, FREET4, T3FREE, THYROIDAB in the last 72  hours. Anemia Panel: No results for input(s): VITAMINB12, FOLATE, FERRITIN, TIBC, IRON, RETICCTPCT in the last 72 hours. Sepsis Labs: No results for input(s): PROCALCITON, LATICACIDVEN in the last 168 hours.  Recent Results (from the past 240 hour(s))  Resp Panel by RT-PCR (Flu A&B, Covid) Nasopharyngeal Swab     Status: None   Collection Time: 01/09/20  2:58 PM   Specimen: Nasopharyngeal Swab; Nasopharyngeal(NP) swabs in vial transport medium  Result Value Ref Range Status   SARS Coronavirus 2 by RT PCR NEGATIVE NEGATIVE Final    Comment: (NOTE) SARS-CoV-2 target nucleic acids are NOT DETECTED.  The SARS-CoV-2 RNA is generally detectable in upper  respiratory specimens during the acute phase of infection. The lowest concentration of SARS-CoV-2 viral copies this assay can detect is 138 copies/mL. A negative result does not preclude SARS-Cov-2 infection and should not be used as the sole basis for treatment or other patient management decisions. A negative result may occur with  improper specimen collection/handling, submission of specimen other than nasopharyngeal swab, presence of viral mutation(s) within the areas targeted by this assay, and inadequate number of viral copies(<138 copies/mL). A negative result must be combined with clinical observations, patient history, and epidemiological information. The expected result is Negative.  Fact Sheet for Patients:  EntrepreneurPulse.com.au  Fact Sheet for Healthcare Providers:  IncredibleEmployment.be  This test is no t yet approved or cleared by the [place] FDA and  has been authorized for detection and/or diagnosis of SARS-CoV-2 by FDA under an Emergency Use Authorization (EUA). This EUA will remain  in effect (meaning this test can be used) for the duration of the COVID-19 declaration under Section 564(b)(1) of the Act, 21 U.S.C.section 360bbb-3(b)(1), unless the authorization is  terminated  or revoked sooner.       Influenza A by PCR NEGATIVE NEGATIVE Final   Influenza B by PCR NEGATIVE NEGATIVE Final    Comment: (NOTE) The Xpert Xpress SARS-CoV-2/FLU/RSV plus assay is intended as an aid in the diagnosis of influenza from Nasopharyngeal swab specimens and should not be used as a sole basis for treatment. Nasal washings and aspirates are unacceptable for Xpert Xpress SARS-CoV-2/FLU/RSV testing.  Fact Sheet for Patients: EntrepreneurPulse.com.au  Fact Sheet for Healthcare Providers: IncredibleEmployment.be  This test is not yet approved or cleared by the [place] FDA and has been authorized for detection and/or diagnosis of SARS-CoV-2 by FDA under an Emergency Use Authorization (EUA). This EUA will remain in effect (meaning this test can be used) for the duration of the COVID-19 declaration under Section 564(b)(1) of the Act, 21 U.S.C. section 360bbb-3(b)(1), unless the authorization is terminated or revoked.  Performed at [facility], [address] [place], [place] [address]      Radiology Studies: CT ABDOMEN PELVIS W CONTRAST  Result Date: 01/14/2020 CLINICAL DATA:  Abdominal pain, history of pancreatitis EXAM: CT ABDOMEN AND PELVIS WITH CONTRAST TECHNIQUE: Multidetector CT imaging of the abdomen and pelvis was performed using the standard protocol following bolus administration of intravenous contrast. CONTRAST:  175mL OMNIPAQUE IOHEXOL 300 MG/ML  SOLN COMPARISON:  03/30/2019 FINDINGS: Lower chest: No acute abnormality. Hepatobiliary: No focal liver abnormality is seen. No gallstones, gallbladder wall thickening, or intrahepatic biliary dilatation. New 8 mm rounded area of low density along the course of the distal common bile duct. Pancreas: Multiple calcifications are again identified within the head and uncinate process and appears slightly increased. Dilatation of the main pancreatic duct may be  slightly increased. No significant peripancreatic infiltration. Spleen: Unremarkable. Adrenals/Urinary Tract: Adrenals, kidneys, and bladder are unremarkable Stomach/Bowel: Small hiatal hernia. Stomach is otherwise within normal limits. Bowel is normal in caliber. Distal colonic diverticulosis. Normal appendix. Vascular/Lymphatic: Multiple collateral vessels at the porta hepatis. Aorta is unremarkable. No enlarged lymph nodes. Reproductive: Post hysterectomy.  No adnexal lesion. Other: No free fluid.  No acute abnormality of the abdominal wall. Musculoskeletal: No acute osseous abnormality. IMPRESSION: Persistent findings of chronic pancreatitis. Slightly increased calcification in the pancreatic head/uncinate and possible slight increase in dilatation of the main pancreatic duct. No surrounding inflammatory changes to suggest superimposed acute pancreatitis. New 8 mm rounded area of low-density along the course of the distal  common bile duct. May reflect focal duct dilatation or cystic lesion of pancreas. 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 Electronically Signed   By: Macy Mis M.D.   On: 01/14/2020 17:27   DG Chest Port 1 View  Result Date: 01/14/2020 CLINICAL DATA:  Leukocytosis EXAM: PORTABLE CHEST 1 VIEW COMPARISON:  Radiograph 01/09/2020. lung bases from abdominal CT earlier today FINDINGS: Lung volumes are low. Normal heart size for technique. No focal airspace disease. No pleural fluid. No pneumothorax. No acute osseous abnormalities are seen. IMPRESSION: Low lung volumes without acute abnormality. Electronically Signed   By: Keith Rake M.D.   On: 01/14/2020 19:52   DG Tibia/Fibula Left Port  Result Date: 01/14/2020 CLINICAL DATA:  Left leg pain. EXAM: PORTABLE LEFT TIBIA AND FIBULA - 2 VIEW COMPARISON:  Knee and ankle radiograph 01/08/2018 FINDINGS: Cortical margins of the tibia and fibular intact. No fracture. Knee alignment is maintained. No evidence of focal  bone lesion. Ankle be separately reported on concurrent ankle exam. There is no focal soft tissue abnormality. IMPRESSION: Negative radiographs of the left lower leg. Electronically Signed   By: Keith Rake M.D.   On: 01/14/2020 19:50   DG Ankle Left Port  Result Date: 01/14/2020 CLINICAL DATA:  Left ankle pain. EXAM: PORTABLE LEFT ANKLE - 2 VIEW COMPARISON:  Radiograph 5 days ago 01/09/2020 FINDINGS: Overlying splint material has been removed. Tiny curvilinear fracture fragment distal to the fibular tip. This is previously obscured by overlying cast. No other fracture. Ankle mortise is preserved. There is a small ankle joint effusion. Soft tissue edema is most prominent laterally. IMPRESSION: Tiny curvilinear fracture fragment distal to the fibular tip, previously obscured by overlying cast material. Soft tissue edema and small joint effusion. Electronically Signed   By: Keith Rake M.D.   On: 01/14/2020 19:51    Scheduled Meds: . amLODipine  10 mg Oral QHS  . capsaicin   Topical BID  . cephALEXin  500 mg Oral BID  . docusate sodium  100 mg Oral Daily  . folic acid  1 mg Oral Daily  . lipase/protease/amylase  36,000-72,000 Units Oral See admin instructions  . multivitamin with minerals  1 tablet Oral Daily  . rivaroxaban  20 mg Oral Q supper  . sulfamethoxazole-trimethoprim  1 tablet Oral BID  . thiamine  100 mg Oral Daily   Or  . thiamine  100 mg Intravenous Daily   Continuous Infusions: . lactated ringers 150 mL (01/15/20 0033)     LOS: 0 days   Marylu Lund, MD [facility] Pager On Amion  If 7PM-7AM, please contact night-coverage 01/15/2020, 6:23 PM

## 2020-01-15 NOTE — ED Notes (Signed)
Pt remains unable to tolerate PO.  Phenergan and cream helped somewhat, pt sitting up in bed vomiting again.  Provider notified.

## 2020-01-15 NOTE — ED Notes (Signed)
Pt sitting up in bed, states she wants to go home.  Frustrated because she can't control the nausea and wants morphine for pain.  Advised pt that meds are ordered, waiting on them to come up from pharmacy.  Pt wants to leave AMA, will notify provider.

## 2020-01-15 NOTE — ED Notes (Signed)
CIWA score refigured and noted at 7 not 21.

## 2020-01-16 DIAGNOSIS — R1010 Upper abdominal pain, unspecified: Secondary | ICD-10-CM

## 2020-01-16 LAB — COMPREHENSIVE METABOLIC PANEL
ALT: 18 U/L (ref 0–44)
AST: 17 U/L (ref 15–41)
Albumin: 4 g/dL (ref 3.5–5.0)
Alkaline Phosphatase: 76 U/L (ref 38–126)
Anion gap: 12 (ref 5–15)
BUN: 16 mg/dL (ref 6–20)
CO2: 30 mmol/L (ref 22–32)
Calcium: 8.8 mg/dL — ABNORMAL LOW (ref 8.9–10.3)
Chloride: 100 mmol/L (ref 98–111)
Creatinine, Ser: 0.7 mg/dL (ref 0.44–1.00)
GFR, Estimated: >60 mL/min (ref >60)
Glucose, Bld: 171 mg/dL — ABNORMAL HIGH (ref 70–99)
Potassium: 3 mmol/L — ABNORMAL LOW (ref 3.5–5.1)
Sodium: 142 mmol/L (ref 135–145)
Total Bilirubin: 0.9 mg/dL (ref 0.3–1.2)
Total Protein: 8.7 g/dL — ABNORMAL HIGH (ref 6.5–8.1)

## 2020-01-16 LAB — CBC
HCT: 46.3 % — ABNORMAL HIGH (ref 36.0–46.0)
Hemoglobin: 14.9 g/dL (ref 12.0–15.0)
MCH: 26.8 pg (ref 26.0–34.0)
MCHC: 32.2 g/dL (ref 30.0–36.0)
MCV: 83.1 fL (ref 80.0–100.0)
Platelets: 271 10*3/uL (ref 150–400)
RBC: 5.57 MIL/uL — ABNORMAL HIGH (ref 3.87–5.11)
RDW: 14.5 % (ref 11.5–15.5)
WBC: 8.4 10*3/uL (ref 4.0–10.5)
nRBC: 0 % (ref 0.0–0.2)

## 2020-01-16 LAB — HIV ANTIBODY (ROUTINE TESTING W REFLEX): HIV Screen 4th Generation wRfx: NONREACTIVE

## 2020-01-16 LAB — TSH: TSH: 1.613 u[IU]/mL (ref 0.350–4.500)

## 2020-01-16 NOTE — Progress Notes (Signed)
Pt wanted to leave AMA. PT called her ride to pick her up. Pt c/o n/v and anxiety before she left. Gave po ativan 1mg  and Zofran 4mg  as ordered. Iv removed from left arm. Her ride here pt is alert and orientated x4. Was able to dress herself. Left via w/c and her ride pushed her to the front lobby.

## 2020-01-16 NOTE — Discharge Summary (Signed)
Physician Discharge Summary  Amy Wolfe OZD:[id number] DOB: 1975-09-03 DOA: 01/14/2020  PCP: Hubbard Hartshorn, FNP  Admit date: 01/14/2020 Discharge date: 01/16/2020  Admitted From: Home Disposition:  Left AMA before pt could be re-evaluated  Brief/Interim Summary: 45 y.o.femalewith medical history significant forhistory of portal vein thrombosis on xarelto,chronic pancreatitis, history of fracture/open ankle dislocation, hypertension, GERD, obesity, CAD, alcohol abuse presented to the emergency department for chief concerns of abdominal pain.  She states the abdominal pain started Sunday at rest, 12/10, and it was in her upper abdomen.   She states her last drink was Friday. She reports he does not drink regularly.  At bedside, patient is difficult to obtain H&P and minimally participates in encounter. When I lightly palpated her left lower extremity due to cast unraveling she yelled at me and yanked her foot away reporting of pain. However upon reminding her that I need to complete the physical exam as part of the H&P, same amount of pressure did not elicit pain or yelling from her. On physical exam, there is some suture wounds on the plantar surface that was negative for visual evidence of infection.  Social history: lives at home with her boyfriend. She endorses tobacco use and etoh use. She denies recreational drug use. 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 Have ordered trial of capsasin cream for symptom relief -On the morning of 1/14, patient elected  to leave against medical advice before she could be re-evaluated. Pt was noted to be alert and oriented at the time of her leaving  Chronic pancreatitis -Was continued with analgesia for severe pain - continued with hydration as tolerated  Leftfoot dislocation with open wounds status post suture per ED provider on 01/09/2020 -Was continued on Keflex 500 mg p.o. twice daily and Bactrim twice daily  Leukocytosis -etiology unclear at this time, blood cultures x2 ordered, noted to be neg  History of alcohol abuse-CIWA protocol -benefits from cessation   Discharge Instructions   Allergies as of 01/16/2020   No Known Allergies     Medication List    ASK your doctor about these medications   acetaminophen 650 MG CR tablet Commonly known as: Tylenol 8 Hour Take 1 tablet (650 mg total) by mouth every 8 (eight) hours as needed for pain.   amLODipine 10 MG tablet Commonly known as: NORVASC Take 1 tablet (10 mg total) by mouth at bedtime.   cephALEXin 500 MG capsule Commonly known as: KEFLEX Take 1 capsule (500 mg total) by mouth 2 (two) times daily for 10 days.   Creon 36000 UNITS Cpep capsule Generic drug: lipase/protease/amylase TAKE 2 CAPSULES BY MOUTH WITH EACH MEAL AND 1 CAPSULE WITH EACH SNACK   docusate sodium 100 MG capsule Commonly known as: COLACE Take 1 capsule (100 mg total) by mouth 2 (two) times daily as needed.   ondansetron 4 MG tablet Commonly known as: ZOFRAN Take 1 tablet (4 mg total) by mouth every 8 (eight) hours as needed for nausea or vomiting.   oxyCODONE-acetaminophen 5-325 MG tablet Commonly known as: Percocet Take 1 tablet by mouth every 4 (four) hours as  needed.   pantoprazole 40 MG tablet Commonly known as: PROTONIX Take 1 tablet (40 mg total) by mouth daily.   Slynd 4 MG Tabs Generic drug: Drospirenone Take 4 mg by mouth daily.   sulfamethoxazole-trimethoprim 800-160 MG tablet Commonly known as: BACTRIM DS Take 1 tablet by mouth  2 (two) times daily for 10 days.   traMADol 50 MG tablet Commonly known as: Ultram Take 1 tablet (50 mg total) by mouth every 6 (six) hours as needed.   Xarelto 20 MG Tabs tablet Generic drug: rivaroxaban TAKE 1 TABLET BY MOUTH ONCE DAILY WITH SUPPER       Follow-up Information    Hubbard Hartshorn, FNP In 1 week.   Specialty: Family Medicine Contact information: [address] [place] [address] [phone]              No Known Allergies  Procedures/Studies: DG Chest 1 View  Result Date: 01/09/2020 CLINICAL DATA:  Ankle fracture. EXAM: CHEST  1 VIEW COMPARISON:  None. FINDINGS: The heart size and mediastinal contours are within normal limits. Both lungs are clear. The visualized skeletal structures are unremarkable. IMPRESSION: No active disease. Electronically Signed   By: Marijo Conception M.D.   On: 01/09/2020 14:27   DG Knee 2 Views Left  Result Date: 01/09/2020 CLINICAL DATA:  Left knee injury. EXAM: LEFT KNEE - 1-2 VIEW COMPARISON:  None. FINDINGS: No evidence of fracture, dislocation, or joint effusion. No evidence of arthropathy or other focal bone abnormality. Soft tissues are unremarkable. IMPRESSION: Negative. Electronically Signed   By: Marijo Conception M.D.   On: 01/09/2020 14:27   DG Ankle Complete Left  Result Date: 01/09/2020 CLINICAL DATA:  Left ankle fracture. EXAM: LEFT ANKLE COMPLETE - 3+ VIEW COMPARISON:  None. FINDINGS: The left ankle has been casted and immobilized. No fracture or dislocation is noted. Joint spaces are intact. IMPRESSION: No acute abnormality seen in the left ankle. Electronically Signed   By: Marijo Conception M.D.   On: 01/09/2020 14:26   CT ABDOMEN PELVIS W CONTRAST  Result Date: 01/14/2020 CLINICAL DATA:  Abdominal pain, history of pancreatitis EXAM: CT ABDOMEN AND PELVIS WITH CONTRAST TECHNIQUE: Multidetector CT imaging of the abdomen and pelvis was performed using the standard protocol following bolus administration of  intravenous contrast. CONTRAST:  115mL OMNIPAQUE IOHEXOL 300 MG/ML  SOLN COMPARISON:  03/30/2019 FINDINGS: Lower chest: No acute abnormality. Hepatobiliary: No focal liver abnormality is seen. No gallstones, gallbladder wall thickening, or intrahepatic biliary dilatation. New 8 mm rounded area of low density along the course of the distal common bile duct. Pancreas: Multiple calcifications are again identified within the head and uncinate process and appears slightly increased. Dilatation of the main pancreatic duct may be slightly increased. No significant peripancreatic infiltration. Spleen: Unremarkable. Adrenals/Urinary Tract: Adrenals, kidneys, and bladder are unremarkable Stomach/Bowel: Small hiatal hernia. Stomach is otherwise within normal limits. Bowel is normal in caliber. Distal colonic diverticulosis. Normal appendix. Vascular/Lymphatic: Multiple collateral vessels at the porta hepatis. Aorta is unremarkable. No enlarged lymph nodes. Reproductive: Post hysterectomy.  No adnexal lesion. Other: No free fluid.  No acute abnormality of the abdominal wall. Musculoskeletal: No acute osseous abnormality. IMPRESSION: Persistent findings of chronic pancreatitis. Slightly increased calcification in the pancreatic head/uncinate and possible slight increase in dilatation of the main pancreatic duct. No surrounding inflammatory changes to suggest superimposed acute pancreatitis. New 8 mm rounded area of low-density along the course of the distal common bile duct. May reflect focal duct  dilatation or cystic lesion of pancreas. 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 Electronically Signed   By: Macy Mis M.D.   On: 01/14/2020 17:27   DG Chest Port 1 View  Result Date: 01/14/2020 CLINICAL DATA:  Leukocytosis EXAM: PORTABLE CHEST 1 VIEW COMPARISON:  Radiograph 01/09/2020. lung bases from abdominal CT earlier today FINDINGS: Lung volumes are low. Normal heart size for technique. No focal  airspace disease. No pleural fluid. No pneumothorax. No acute osseous abnormalities are seen. IMPRESSION: Low lung volumes without acute abnormality. Electronically Signed   By: Keith Rake M.D.   On: 01/14/2020 19:52   DG Tibia/Fibula Left Port  Result Date: 01/14/2020 CLINICAL DATA:  Left leg pain. EXAM: PORTABLE LEFT TIBIA AND FIBULA - 2 VIEW COMPARISON:  Knee and ankle radiograph 01/08/2018 FINDINGS: Cortical margins of the tibia and fibular intact. No fracture. Knee alignment is maintained. No evidence of focal bone lesion. Ankle be separately reported on concurrent ankle exam. There is no focal soft tissue abnormality. IMPRESSION: Negative radiographs of the left lower leg. Electronically Signed   By: Keith Rake M.D.   On: 01/14/2020 19:50   DG Ankle Left Port  Result Date: 01/14/2020 CLINICAL DATA:  Left ankle pain. EXAM: PORTABLE LEFT ANKLE - 2 VIEW COMPARISON:  Radiograph 5 days ago 01/09/2020 FINDINGS: Overlying splint material has been removed. Tiny curvilinear fracture fragment distal to the fibular tip. This is previously obscured by overlying cast. No other fracture. Ankle mortise is preserved. There is a small ankle joint effusion. Soft tissue edema is most prominent laterally. IMPRESSION: Tiny curvilinear fracture fragment distal to the fibular tip, previously obscured by overlying cast material. Soft tissue edema and small joint effusion. Electronically Signed   By: Keith Rake M.D.   On: 01/14/2020 19:51     Subjective: Pt chose to leave before being seen in re-evaluation  Discharge Exam: Vitals:   01/15/20 1642 01/15/20 1959  BP: (!) 146/107 (!) 148/92  Pulse: (!) 107 88  Resp: 18 (!) 22  Temp: (!) 97.5 F (36.4 C) 98.5 F (36.9 C)  SpO2: 97% 97%   Vitals:   01/15/20 1300 01/15/20 1500 01/15/20 1642 01/15/20 1959  BP: (!) 161/99 (!) 151/95 (!) 146/107 (!) 148/92  Pulse: 66 81 (!) 107 88  Resp: 18 16 18  (!) 22  Temp:   (!) 97.5 F (36.4 C) 98.5 F  (36.9 C)  TempSrc:    Oral  SpO2: 99% 94% 97% 97%  Weight:      Height:         The results of significant diagnostics from this hospitalization (including imaging, microbiology, ancillary and laboratory) are listed below for reference.     Microbiology: Recent Results (from the past 240 hour(s))  Resp Panel by RT-PCR (Flu A&B, Covid) Nasopharyngeal Swab     Status: None   Collection Time: 01/09/20  2:58 PM   Specimen: Nasopharyngeal Swab; Nasopharyngeal(NP) swabs in vial transport medium  Result Value Ref Range Status   SARS Coronavirus 2 by RT PCR NEGATIVE NEGATIVE Final    Comment: (NOTE) SARS-CoV-2 target nucleic acids are NOT DETECTED.  The SARS-CoV-2 RNA is generally detectable in upper respiratory specimens during the acute phase of infection. The lowest concentration of SARS-CoV-2 viral copies this assay can detect is 138 copies/mL. A negative result does not preclude SARS-Cov-2 infection and should not be used as the sole basis for treatment or other patient management decisions. A negative result may occur with  improper specimen  collection/handling, submission of specimen other than nasopharyngeal swab, presence of viral mutation(s) within the areas targeted by this assay, and inadequate number of viral copies(<138 copies/mL). A negative result must be combined with clinical observations, patient history, and epidemiological information. The expected result is Negative.  Fact Sheet for Patients:  EntrepreneurPulse.com.au  Fact Sheet for Healthcare Providers:  IncredibleEmployment.be  This test is no t yet approved or cleared by the [place] FDA and  has been authorized for detection and/or diagnosis of SARS-CoV-2 by FDA under an Emergency Use Authorization (EUA). This EUA will remain  in effect (meaning this test can be used) for the duration of the COVID-19 declaration under Section 564(b)(1) of the Act,  21 U.S.C.section 360bbb-3(b)(1), unless the authorization is terminated  or revoked sooner.       Influenza A by PCR NEGATIVE NEGATIVE Final   Influenza B by PCR NEGATIVE NEGATIVE Final    Comment: (NOTE) The Xpert Xpress SARS-CoV-2/FLU/RSV plus assay is intended as an aid in the diagnosis of influenza from Nasopharyngeal swab specimens and should not be used as a sole basis for treatment. Nasal washings and aspirates are unacceptable for Xpert Xpress SARS-CoV-2/FLU/RSV testing.  Fact Sheet for Patients: EntrepreneurPulse.com.au  Fact Sheet for Healthcare Providers: IncredibleEmployment.be  This test is not yet approved or cleared by the [place] FDA and has been authorized for detection and/or diagnosis of SARS-CoV-2 by FDA under an Emergency Use Authorization (EUA). This EUA will remain in effect (meaning this test can be used) for the duration of the COVID-19 declaration under Section 564(b)(1) of the Act, 21 U.S.C. section 360bbb-3(b)(1), unless the authorization is terminated or revoked.  Performed at [facility], [address] [place], [place] [address]   Culture, blood (Routine X 2) w Reflex to ID Panel     Status: None (Preliminary result)   Collection Time: 01/15/20  9:50 PM   Specimen: BLOOD  Result Value Ref Range Status   Specimen Description BLOOD BLOOD RIGHT HAND  Final   Special Requests   Final    BOTTLES DRAWN AEROBIC AND ANAEROBIC Blood Culture results may not be optimal due to an inadequate volume of blood received in culture bottles   Culture   Final    NO GROWTH < 12 HOURS Performed at [facility], [address], [place], [place] [address]    Report Status PENDING  Incomplete  Culture, blood (Routine X 2) w Reflex to ID Panel     Status: None (Preliminary result)   Collection Time: 01/15/20 11:07 PM   Specimen: BLOOD  Result Value Ref Range Status   Specimen Description BLOOD BLOOD  LEFT ARM  Final   Special Requests   Final    BOTTLES DRAWN AEROBIC AND ANAEROBIC Blood Culture adequate volume   Culture   Final    NO GROWTH < 12 HOURS Performed at [facility], [address] [place],  [address]    Report Status PENDING  Incomplete     Labs: BNP (last 3 results) No results for input(s): BNP in the last 8760 hours. Basic Metabolic Panel: Recent Labs  Lab 01/14/20 1023 01/14/20 1922 01/15/20 0603 01/16/20 0143  NA 140 138 139 142  K 3.4* 3.6 3.3* 3.0*  CL 100 101 99 100  CO2 25 24 30 30   GLUCOSE 284* 202* 175* 171*  BUN 14 13 14 16   CREATININE 0.69 0.77 0.70 0.70  CALCIUM 9.8 9.1 9.3 8.8*  MG  --  1.8  --   --  PHOS  --  3.4  --   --    Liver Function Tests: Recent Labs  Lab 01/14/20 1023 01/14/20 1922 01/16/20 0143  AST 25 19 17   ALT 26 20 18   ALKPHOS 86 76 76  BILITOT 0.8 0.6 0.9  PROT 8.5* 7.9 8.7*  ALBUMIN 4.2 3.9 4.0   Recent Labs  Lab 01/14/20 1023  LIPASE 27   No results for input(s): AMMONIA in the last 168 hours. CBC: Recent Labs  Lab 01/14/20 1023 01/14/20 1922 01/15/20 0603 01/16/20 0143  WBC 13.3* 13.8* 15.2* 8.4  NEUTROABS 11.9*  --   --   --   HGB 14.8 14.4 13.8 14.9  HCT 46.0 43.4 43.5 46.3*  MCV 82.9 82.2 83.3 83.1  PLT 314 254 297 271   Cardiac Enzymes: No results for input(s): CKTOTAL, CKMB, CKMBINDEX, TROPONINI in the last 168 hours. BNP: Invalid input(s): POCBNP CBG: No results for input(s): GLUCAP in the last 168 hours. D-Dimer No results for input(s): DDIMER in the last 72 hours. Hgb A1c No results for input(s): HGBA1C in the last 72 hours. Lipid Profile No results for input(s): CHOL, HDL, LDLCALC, TRIG, CHOLHDL, LDLDIRECT in the last 72 hours. Thyroid function studies Recent Labs    01/16/20 0143  TSH 1.613   Anemia work up No results for input(s): VITAMINB12, FOLATE, FERRITIN, TIBC, IRON, RETICCTPCT in the last 72 hours. Urinalysis    Component Value Date/Time    COLORURINE YELLOW (A) 01/09/2020 2138   APPEARANCEUR CLOUDY (A) 01/09/2020 2138   LABSPEC 1.020 01/09/2020 2138   PHURINE 5.0 01/09/2020 2138   GLUCOSEU NEGATIVE 01/09/2020 2138   HGBUR MODERATE (A) 01/09/2020 2138   BILIRUBINUR NEGATIVE 01/09/2020 2138   KETONESUR 5 (A) 01/09/2020 2138   PROTEINUR NEGATIVE 01/09/2020 2138   NITRITE NEGATIVE 01/09/2020 2138   LEUKOCYTESUR NEGATIVE 01/09/2020 2138   Sepsis Labs Invalid input(s): PROCALCITONIN,  WBC,  LACTICIDVEN Microbiology Recent Results (from the past 240 hour(s))  Resp Panel by RT-PCR (Flu A&B, Covid) Nasopharyngeal Swab     Status: None   Collection Time: 01/09/20  2:58 PM   Specimen: Nasopharyngeal Swab; Nasopharyngeal(NP) swabs in vial transport medium  Result Value Ref Range Status   SARS Coronavirus 2 by RT PCR NEGATIVE NEGATIVE Final    Comment: (NOTE) SARS-CoV-2 target nucleic acids are NOT DETECTED.  The SARS-CoV-2 RNA is generally detectable in upper respiratory specimens during the acute phase of infection. The lowest concentration of SARS-CoV-2 viral copies this assay can detect is 138 copies/mL. A negative result does not preclude SARS-Cov-2 infection and should not be used as the sole basis for treatment or other patient management decisions. A negative result may occur with  improper specimen collection/handling, submission of specimen other than nasopharyngeal swab, presence of viral mutation(s) within the areas targeted by this assay, and inadequate number of viral copies(<138 copies/mL). A negative result must be combined with clinical observations, patient history, and epidemiological information. The expected result is Negative.  Fact Sheet for Patients:  EntrepreneurPulse.com.au  Fact Sheet for Healthcare Providers:  IncredibleEmployment.be  This test is no t yet approved or cleared by the [place] FDA and  has been authorized for detection and/or diagnosis of  SARS-CoV-2 by FDA under an Emergency Use Authorization (EUA). This EUA will remain  in effect (meaning this test can be used) for the duration of the COVID-19 declaration under Section 564(b)(1) of the Act, 21 U.S.C.section 360bbb-3(b)(1), unless the authorization is terminated  or revoked sooner.  Influenza A by PCR NEGATIVE NEGATIVE Final   Influenza B by PCR NEGATIVE NEGATIVE Final    Comment: (NOTE) The Xpert Xpress SARS-CoV-2/FLU/RSV plus assay is intended as an aid in the diagnosis of influenza from Nasopharyngeal swab specimens and should not be used as a sole basis for treatment. Nasal washings and aspirates are unacceptable for Xpert Xpress SARS-CoV-2/FLU/RSV testing.  Fact Sheet for Patients: EntrepreneurPulse.com.au  Fact Sheet for Healthcare Providers: IncredibleEmployment.be  This test is not yet approved or cleared by the [place] FDA and has been authorized for detection and/or diagnosis of SARS-CoV-2 by FDA under an Emergency Use Authorization (EUA). This EUA will remain in effect (meaning this test can be used) for the duration of the COVID-19 declaration under Section 564(b)(1) of the Act, 21 U.S.C. section 360bbb-3(b)(1), unless the authorization is terminated or revoked.  Performed at [facility], [address] [place], [place] [address]   Culture, blood (Routine X 2) w Reflex to ID Panel     Status: None (Preliminary result)   Collection Time: 01/15/20  9:50 PM   Specimen: BLOOD  Result Value Ref Range Status   Specimen Description BLOOD BLOOD RIGHT HAND  Final   Special Requests   Final    BOTTLES DRAWN AEROBIC AND ANAEROBIC Blood Culture results may not be optimal due to an inadequate volume of blood received in culture bottles   Culture   Final    NO GROWTH < 12 HOURS Performed at [facility], [address] [place], [place] [address]    Report Status PENDING  Incomplete   Culture, blood (Routine X 2) w Reflex to ID Panel     Status: None (Preliminary result)   Collection Time: 01/15/20 11:07 PM   Specimen: BLOOD  Result Value Ref Range Status   Specimen Description BLOOD BLOOD LEFT ARM  Final   Special Requests   Final    BOTTLES DRAWN AEROBIC AND ANAEROBIC Blood Culture adequate volume   Culture   Final    NO GROWTH < 12 HOURS Performed at [facility], [address] [place],  [address]    Report Status PENDING  Incomplete   Time spent: 86min  SIGNED:   Marylu Lund, MD  [facility] 01/16/2020, 5:30 PM  If 7PM-7AM, please contact night-coverage

## 2020-01-20 LAB — CULTURE, BLOOD (ROUTINE X 2)
Culture: NO GROWTH
Culture: NO GROWTH
Special Requests: ADEQUATE

## 2020-02-05 ENCOUNTER — Other Ambulatory Visit: Payer: Self-pay

## 2020-02-05 ENCOUNTER — Emergency Department
Admission: EM | Admit: 2020-02-05 | Discharge: 2020-02-05 | Disposition: A | Discharge status: Home / Self Care | Payer: Medicaid Other | Attending: Emergency Medicine | Admitting: Emergency Medicine

## 2020-02-05 DIAGNOSIS — F17210 Nicotine dependence, cigarettes, uncomplicated: Secondary | ICD-10-CM | POA: Diagnosis not present

## 2020-02-05 DIAGNOSIS — Z8719 Personal history of other diseases of the digestive system: Secondary | ICD-10-CM | POA: Diagnosis not present

## 2020-02-05 DIAGNOSIS — K219 Gastro-esophageal reflux disease without esophagitis: Secondary | ICD-10-CM | POA: Insufficient documentation

## 2020-02-05 DIAGNOSIS — R1115 Cyclical vomiting syndrome unrelated to migraine: Secondary | ICD-10-CM | POA: Diagnosis not present

## 2020-02-05 DIAGNOSIS — I2510 Atherosclerotic heart disease of native coronary artery without angina pectoris: Secondary | ICD-10-CM | POA: Diagnosis not present

## 2020-02-05 DIAGNOSIS — Z7901 Long term (current) use of anticoagulants: Secondary | ICD-10-CM | POA: Diagnosis not present

## 2020-02-05 DIAGNOSIS — I10 Essential (primary) hypertension: Secondary | ICD-10-CM | POA: Diagnosis not present

## 2020-02-05 DIAGNOSIS — R1010 Upper abdominal pain, unspecified: Secondary | ICD-10-CM | POA: Diagnosis present

## 2020-02-05 DIAGNOSIS — Z79899 Other long term (current) drug therapy: Secondary | ICD-10-CM | POA: Diagnosis not present

## 2020-02-05 LAB — CBC WITH DIFFERENTIAL/PLATELET
Abs Immature Granulocytes: 0.05 10*3/uL (ref 0.00–0.07)
Basophils Absolute: 0 10*3/uL (ref 0.0–0.1)
Basophils Relative: 0 %
Eosinophils Absolute: 0 10*3/uL (ref 0.0–0.5)
Eosinophils Relative: 0 %
HCT: 45.9 % (ref 36.0–46.0)
Hemoglobin: 14.8 g/dL (ref 12.0–15.0)
Immature Granulocytes: 0 %
Lymphocytes Relative: 6 %
Lymphs Abs: 0.7 10*3/uL (ref 0.7–4.0)
MCH: 26.9 pg (ref 26.0–34.0)
MCHC: 32.2 g/dL (ref 30.0–36.0)
MCV: 83.3 fL (ref 80.0–100.0)
Monocytes Absolute: 0.4 10*3/uL (ref 0.1–1.0)
Monocytes Relative: 4 %
Neutro Abs: 10.2 10*3/uL — ABNORMAL HIGH (ref 1.7–7.7)
Neutrophils Relative %: 90 %
Platelets: 387 10*3/uL (ref 150–400)
RBC: 5.51 MIL/uL — ABNORMAL HIGH (ref 3.87–5.11)
RDW: 14.1 % (ref 11.5–15.5)
WBC: 11.5 10*3/uL — ABNORMAL HIGH (ref 4.0–10.5)
nRBC: 0 % (ref 0.0–0.2)

## 2020-02-05 LAB — COMPREHENSIVE METABOLIC PANEL
ALT: 17 U/L (ref 0–44)
AST: 28 U/L (ref 15–41)
Albumin: 4.3 g/dL (ref 3.5–5.0)
Alkaline Phosphatase: 79 U/L (ref 38–126)
Anion gap: 14 (ref 5–15)
BUN: 11 mg/dL (ref 6–20)
CO2: 24 mmol/L (ref 22–32)
Calcium: 9.8 mg/dL (ref 8.9–10.3)
Chloride: 103 mmol/L (ref 98–111)
Creatinine, Ser: 0.88 mg/dL (ref 0.44–1.00)
GFR, Estimated: >60 mL/min (ref >60)
Glucose, Bld: 310 mg/dL — ABNORMAL HIGH (ref 70–99)
Potassium: 4.2 mmol/L (ref 3.5–5.1)
Sodium: 141 mmol/L (ref 135–145)
Total Bilirubin: 1 mg/dL (ref 0.3–1.2)
Total Protein: 8.8 g/dL — ABNORMAL HIGH (ref 6.5–8.1)

## 2020-02-05 LAB — URINALYSIS, ROUTINE W REFLEX MICROSCOPIC
Bacteria, UA: NONE SEEN
Bilirubin Urine: NEGATIVE
Glucose, UA: >=500 mg/dL — AB
Ketones, ur: 20 mg/dL — AB
Leukocytes,Ua: NEGATIVE
Nitrite: NEGATIVE
Protein, ur: 100 mg/dL — AB
Specific Gravity, Urine: 1.033 — ABNORMAL HIGH (ref 1.005–1.030)
pH: 7 (ref 5.0–8.0)

## 2020-02-05 LAB — URINE DRUG SCREEN, QUALITATIVE (ARMC ONLY)
Amphetamines, Ur Screen: NOT DETECTED
Barbiturates, Ur Screen: NOT DETECTED
Benzodiazepine, Ur Scrn: NOT DETECTED
Cannabinoid 50 Ng, Ur ~~LOC~~: POSITIVE — AB
Cocaine Metabolite,Ur ~~LOC~~: NOT DETECTED
MDMA (Ecstasy)Ur Screen: NOT DETECTED
Methadone Scn, Ur: NOT DETECTED
Opiate, Ur Screen: NOT DETECTED
Phencyclidine (PCP) Ur S: NOT DETECTED
Tricyclic, Ur Screen: NOT DETECTED

## 2020-02-05 LAB — HEMOGLOBIN A1C
Hgb A1c MFr Bld: 7.1 % — ABNORMAL HIGH (ref 4.8–5.6)
Mean Plasma Glucose: 157.07 mg/dL

## 2020-02-05 LAB — LIPASE, BLOOD: Lipase: 37 U/L (ref 11–51)

## 2020-02-05 LAB — ETHANOL: Alcohol, Ethyl (B): <10 mg/dL (ref <10)

## 2020-02-05 LAB — POC URINE PREG, ED: Preg Test, Ur: NEGATIVE

## 2020-02-05 MED ORDER — DICYCLOMINE HCL 10 MG PO CAPS: 10.0000 mg | ORAL_CAPSULE | Freq: Three times a day (TID) | ORAL | 0 refills | Status: DC | PRN

## 2020-02-05 MED ORDER — DROPERIDOL 2.5 MG/ML IJ SOLN
2.5000 mg | Freq: Once | INTRAMUSCULAR | Status: AC
  Administered 2020-02-05: 2.5 mg via INTRAVENOUS

## 2020-02-05 MED ORDER — PROMETHAZINE HCL 12.5 MG PO TABS: 12.5000 mg | ORAL_TABLET | Freq: Four times a day (QID) | ORAL | 0 refills | Status: DC | PRN

## 2020-02-05 MED ORDER — MIDAZOLAM HCL 5 MG/5ML IJ SOLN
1.0000 mg | Freq: Once | INTRAMUSCULAR | Status: AC
  Administered 2020-02-05: 1 mg via INTRAVENOUS
  Filled 2020-02-05: qty 5

## 2020-02-05 MED ORDER — ONDANSETRON HCL 4 MG/2ML IJ SOLN: 4.0000 mg | Freq: Once | INTRAMUSCULAR | Status: DC

## 2020-02-05 MED ORDER — PANTOPRAZOLE SODIUM 40 MG IV SOLR
40.0000 mg | Freq: Once | INTRAVENOUS | Status: AC
  Administered 2020-02-05: 40 mg via INTRAVENOUS
  Filled 2020-02-05: qty 40

## 2020-02-05 MED ORDER — LACTATED RINGERS IV BOLUS
1000.0000 mL | Freq: Once | INTRAVENOUS | Status: AC
  Administered 2020-02-05: 1000 mL via INTRAVENOUS

## 2020-02-05 MED ORDER — AMLODIPINE BESYLATE 5 MG PO TABS: 10.0000 mg | ORAL_TABLET | Freq: Every day | ORAL | Status: DC

## 2020-02-05 MED ORDER — DIPHENHYDRAMINE HCL 50 MG/ML IJ SOLN
12.5000 mg | INTRAMUSCULAR | Status: AC
  Administered 2020-02-05: 12.5 mg via INTRAVENOUS
  Filled 2020-02-05: qty 1

## 2020-02-05 MED ORDER — AMLODIPINE BESYLATE 5 MG PO TABS: 10.0000 mg | ORAL_TABLET | ORAL | Status: DC

## 2020-02-05 NOTE — ED Notes (Signed)
Pt ambulatory to toilet without difficulty. Clean catch urine specimen obtained. POC UPreg performed. Specimen sent to lab.

## 2020-02-05 NOTE — ED Notes (Signed)
Pt states she has no way to get home, this RN consulted charge nurse. Pt to finish fluids. This RN will reassess for safe dc at that time.

## 2020-02-05 NOTE — ED Notes (Signed)
Light green tube recollected and sent to lab 

## 2020-02-05 NOTE — ED Notes (Signed)
Pt vomiting after po intake, EDP notified

## 2020-02-05 NOTE — ED Notes (Signed)
Pt given phone to call boyfriend. Pt states boyfriend has her phone.

## 2020-02-05 NOTE — ED Notes (Signed)
Pt states she is feeling less nauseous. Pt attempting to find ride home.

## 2020-02-05 NOTE — ED Provider Notes (Signed)
I seen care of this patient approximately 0 700.  Please see outgoing providers note  for full details regarding patient's initial evaluation assessment.  In brief patient presents with a history of portal vein thrombosis on Xarelto and multiple visits for cannabinoid hyperemesis syndrome versus cyclic vomiting and history of pancreatitis related to alcohol abuse for assessment of some nausea vomiting abdominal pain diarrhea.  Initial exam and work-up is reassuring, concern for possible gastroenteritis versus recurrence of cyclic vomiting.  Patient given below noted medications and IV fluids.  On my reassessment she stated she felt little better.  Rx written for antiemetics.  Advised patient to cease all THC use and EtOH use and follow-up with her PCP for close outpatient basis.  Patient voiced understanding and agreement this plan.  Discharge stable condition.  Strict return cautions/discussed  Medications  amLODipine (NORVASC) tablet 10 mg (has no administration in time range)  droperidol (INAPSINE) 2.5 MG/ML injection 2.5 mg (2.5 mg Intravenous Given 02/05/20 0309)  lactated ringers bolus 1,000 mL (1,000 mLs Intravenous New Bag/Given 02/05/20 0309)  diphenhydrAMINE (BENADRYL) injection 12.5 mg (12.5 mg Intravenous Given 02/05/20 0308)  droperidol (INAPSINE) 2.5 MG/ML injection 2.5 mg (2.5 mg Intravenous Given 02/05/20 0512)  lactated ringers bolus 1,000 mL (1,000 mLs Intravenous New Bag/Given 02/05/20 0755)  pantoprazole (PROTONIX) injection 40 mg (40 mg Intravenous Given 02/05/20 0754)      Lucrezia Starch, MD 02/05/20 [phone]

## 2020-02-05 NOTE — ED Provider Notes (Signed)
[facility] Emergency Department Provider Note  ____________________________________________   Event Date/Time   First MD Initiated Contact with Patient 02/05/20 0244     (approximate)  I have reviewed the triage vital signs and the nursing notes.   HISTORY  Chief Complaint Abdominal Pain  Level 5 caveat:  history/ROS limited by acute/critical illness and/or unwillingness or inability to participate in the history.  HPI Amy Wolfe is a 45 y.o. female with medical issues as listed below which notably includes a portal vein thrombosis on Xarelto and history with multiple visits and admissions for cannabinoid hyperemesis versus cyclic vomiting, versus nonspecific intractable vomiting.  She also has a history of alcohol abuse which is well-documented in the medical history as well as chronic pancreatitis.  She presents tonight by EMS complaining of severe upper abdominal pain and persistent nausea and vomiting.   History is mostly provided by paramedics as the patient will not answer many questions, simply says that she needs something for pain and spits into an emesis bag.  The paramedics report that they were told that she had a "pancreatitis flare of" that started yesterday after eating some fried chicken.  She apparently has not been able to keep anything down and the pain is listed as severe.  The patient denies any recent alcohol consumption says she has not smoked any marijuana in about a week.  Nothing particular makes his symptoms better or worse.        Past Medical History:  Diagnosis Date  . Abdominal pain   . Anxiety   . Coronary artery disease   . Dyspnea   . GERD (gastroesophageal reflux disease)   . H/O blood clots   . Hypertension   . Liver abscess   . Pancreatitis   . PONV (postoperative nausea and vomiting) 11/07/2018  . Thyroid disease   . Uterine fibroid     Patient Active Problem List   Diagnosis Date Noted  . 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 Intractable vomiting with nausea 01/14/2020  . GERD (gastroesophageal reflux disease) 03/30/2019  . Tobacco abuse 03/30/2019  . Abdominal pain 03/30/2019  . Syncope 03/30/2019  . PONV (postoperative nausea and vomiting) 11/07/2018  . Vaginal cuff cellulitis 11/04/2018  . Status post laparoscopic hysterectomy 10/28/2018  . Anticoagulated 10/04/2018  . Pelvic pain 10/04/2018  . 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 History of partial thyroidectomy 10/04/2018  . Symptomatic anemia 06/13/2018  . Swelling of limb 05/17/2018  . Essential hypertension 05/17/2018  . Pancreatitis, chronic ([place]) 05/17/2018  . Portal vein thrombosis 05/17/2018    Past Surgical History:  Procedure Laterality Date  . CESAREAN SECTION    . EMBOLIZATION N/A 06/14/2018   Procedure: Uterine Embolization;  Surgeon: Algernon Huxley, MD;  Location: [facility];  Service: Cardiovascular;  Laterality: N/A;  . laceration finger Right   . LAPAROSCOPIC VAGINAL HYSTERECTOMY WITH SALPINGECTOMY Bilateral 10/28/2018   Procedure: LAPAROSCOPIC ASSISTED VAGINAL HYSTERECTOMY BILATERAL WITH SALPINGECTOMY;  Surgeon: Rubie Maid, MD;  Location: [facility];  Service: Gynecology;  Laterality: Bilateral;  . THYROIDECTOMY, PARTIAL    . VAGINAL HYSTERECTOMY Bilateral 10/28/2018   Procedure: HYSTERECTOMY VAGINAL WITH BILATERAL SALPINGECTOMY;  Surgeon: Rubie Maid, MD;  Location: [facility];  Service: Gynecology;  Laterality: Bilateral;    Prior to Admission medications   Medication Sig Start Date End Date Taking? Authorizing Provider  dicyclomine (BENTYL) 10 MG capsule Take 1 capsule (10  mg total) by mouth 3 (three) times daily as needed for up to 14 days for spasms. or abdominal pain 02/05/20 02/19/20 Yes Hinda Kehr, MD  promethazine (PHENERGAN) 12.5 MG tablet Take 1 tablet (12.5 mg total) by mouth every 6 (six) hours as needed  for nausea or vomiting. 02/05/20  Yes Hinda Kehr, MD  acetaminophen (TYLENOL 8 HOUR) 650 MG CR tablet Take 1 tablet (650 mg total) by mouth every 8 (eight) hours as needed for pain. 10/30/18   Rubie Maid, MD  amLODipine (NORVASC) 10 MG tablet Take 1 tablet (10 mg total) by mouth at bedtime. 05/13/19   Rubie Maid, MD  CREON 36000 units CPEP capsule TAKE 2 CAPSULES BY MOUTH WITH EACH MEAL AND 1 CAPSULE WITH [facility] Patient taking differently: Take 36,000-72,000 Units by mouth See admin instructions. Take 2 capsules (72000u) by mouth three times daily with meals and take 1 capsule (36000u) by mouth twice daily with snacks 12/04/18   Lucilla Lame, MD  docusate sodium (COLACE) 100 MG capsule Take 1 capsule (100 mg total) by mouth 2 (two) times daily as needed. Patient taking differently: Take 100 mg by mouth daily.  10/04/18   Hubbard Hartshorn, FNP  Drospirenone (SLYND) 4 MG TABS Take 4 mg by mouth daily. 08/08/19   Rubie Maid, MD  ondansetron (ZOFRAN) 4 MG tablet Take 1 tablet (4 mg total) by mouth every 8 (eight) hours as needed for nausea or vomiting. 08/07/19   Rubie Maid, MD  oxyCODONE-acetaminophen (PERCOCET) 5-325 MG tablet Take 1 tablet by mouth every 4 (four) hours as needed. 01/10/20   Rudene Re, MD  pantoprazole (PROTONIX) 40 MG tablet Take 1 tablet (40 mg total) by mouth daily. 05/13/19   Rubie Maid, MD  traMADol (ULTRAM) 50 MG tablet Take 1 tablet (50 mg total) by mouth every 6 (six) hours as needed. 06/01/19 05/31/20  Nance Pear, MD  XARELTO 20 MG TABS tablet TAKE 1 TABLET BY MOUTH ONCE DAILY WITH SUPPER Patient taking differently: Take 20 mg by mouth daily with supper. 10/22/19   Kris Hartmann, NP    Allergies Patient has no known allergies.  Family History  Problem Relation Age of Onset  . Hypertension Mother   . Diabetes Mother   . Cancer Father   . Cancer Paternal Grandmother   . Cancer Paternal Grandfather   . Aneurysm Brother     Social  History Social History   Tobacco Use  . Smoking status: Current Every Day Smoker    Packs/day: 0.50    Years: 35.00    Pack years: 17.50    Types: Cigarettes  . Smokeless tobacco: Never Used  Vaping Use  . Vaping Use: Never used  Substance Use Topics  . Alcohol use: Not Currently  . Drug use: Yes    Types: Marijuana    Comment: daily    Review of Systems Level 5 caveat:  history/ROS limited by acute/critical illness and/or unwillingness or inability to participate in the history.    ____________________________________________   PHYSICAL EXAM:  VITAL SIGNS: ED Triage Vitals  Enc Vitals Group     BP 02/05/20 0250 (!) 157/107     Pulse Rate 02/05/20 0247 (!) 114     Resp 02/05/20 0247 (!) 22     Temp 02/05/20 0247 98.8 F (37.1 C)     Temp Source 02/05/20 0247 Oral     SpO2 02/05/20 0247 100 %     Weight --      Height --  Head Circumference --      Peak Flow --      Pain Score 02/05/20 0247 10     Pain Loc --      Pain Edu? --      Excl. in [place]? --     Constitutional: Patient is awake and alert, appears uncomfortable, thrashing around on the bed, spitting into an emesis bag, moaning. Eyes: Conjunctivae are normal.  Head: Atraumatic. Nose: No congestion/rhinnorhea. Mouth/Throat: Patient is wearing a mask. Neck: No stridor.  No meningeal signs.   Cardiovascular: Normal rate, regular rhythm. Good peripheral circulation. Respiratory: Normal respiratory effort.  No retractions. Gastrointestinal: Soft and nondistended.  Patient reports tenderness everywhere I lightly palpate.  No specific focal tenderness. Musculoskeletal: Patient has healing wounds on her left ankle from a known prior open fracture.  No evidence of active infection. Neurologic: Speech is minimal due to her discomfort and moaning but which she says appears normal. No gross focal neurologic deficits are appreciated.  Skin:  Skin is warm, dry and  intact.   ____________________________________________   LABS (all labs ordered are listed, but only abnormal results are displayed)  Labs Reviewed  CBC WITH DIFFERENTIAL/PLATELET - Abnormal; Notable for the following components:      Result Value   WBC 11.5 (*)    RBC 5.51 (*)    Neutro Abs 10.2 (*)    All other components within normal limits  URINALYSIS, ROUTINE W REFLEX MICROSCOPIC - Abnormal; Notable for the following components:   Color, Urine YELLOW (*)    APPearance HAZY (*)    Specific Gravity, Urine 1.033 (*)    Glucose, UA >=500 (*)    Hgb urine dipstick SMALL (*)    Ketones, ur 20 (*)    Protein, ur 100 (*)    All other components within normal limits  URINE DRUG SCREEN, QUALITATIVE ([facility] ONLY) - Abnormal; Notable for the following components:   Cannabinoid 50 Ng, Ur [place] POSITIVE (*)    All other components within normal limits  COMPREHENSIVE METABOLIC PANEL - Abnormal; Notable for the following components:   Glucose, Bld 310 (*)    Total Protein 8.8 (*)    All other components within normal limits  ETHANOL  LIPASE, BLOOD  POC URINE PREG, ED   ____________________________________________  EKG  ED ECG REPORT I, Hinda Kehr, the attending physician, personally viewed and interpreted this ECG.  Date: 02/05/2020 EKG Time: 2:56 AM Rate: 111 Rhythm: Sinus tachycardia QRS Axis: normal Intervals: normal ST/T Wave abnormalities: normal Narrative Interpretation: no evidence of acute ischemia  ____________________________________________  RADIOLOGY I, Hinda Kehr, personally viewed and evaluated these images (plain radiographs) as part of my medical decision making, as well as reviewing the written report by the radiologist.  ED MD interpretation: No indication for emergent imaging  Official radiology report(s): No results found.  ____________________________________________   PROCEDURES   Procedure(s) performed (including Critical Care):  .1-3  Lead EKG Interpretation Performed by: Hinda Kehr, MD Authorized by: Hinda Kehr, MD     Interpretation: abnormal     ECG rate:  112   ECG rate assessment: tachycardic     Rhythm: sinus tachycardia     Ectopy: none     Conduction: normal       ____________________________________________   INITIAL IMPRESSION / MDM / ASSESSMENT AND PLAN / ED COURSE  As part of my medical decision making, I reviewed the following data within the [place] notes reviewed and incorporated, Labs reviewed ,  EKG interpreted , Old EKG reviewed, Old chart reviewed, Notes from prior ED visits and [place] Controlled Substance Database   Differential diagnosis includes, but is not limited to, cyclic vomiting syndrome, cannabinoid hyperemesis syndrome, acute on chronic pancreatitis, alcohol withdrawal, narcotics withdrawal, acute infection.  Patient is slightly tachycardic likely due to discomfort and possible volume depletion.  Could also be signs/symptoms of withdrawal.  I reviewed her medical record and prior admissions and ED visits and see a pattern of cyclic vomiting-like symptoms but also a lot of concern for cannabinoid hyperemesis.  I reviewed the [place] controlled substance database and see a number of prescriptions for variety of opioids, although some of this was noted due to her extensive orthopedic injuries, but also included a variety of foods from the emergency department and Dilaudid from her OB/GYN.  Her most recent prescription was for tramadol which was prescribed a little bit less than 2 weeks ago.  To assist her with her symptoms of what appears to be nonspecific cyclic vomiting, I verified on the current EKG that she does not have prolonged QTC (1 prior EKG showed prolonged QTC but the other ones did not).  I am ordering droperidol 2.5 mg IV as well as diphenhydramine 12.5 mg IV which I believe will help with her symptoms significantly.  I am also giving 1 L normal  saline bolus of lactated Ringer's.  Blood work is pending and I have also ordered a urinalysis to check for signs of infection as well as a urine drug screen.  Patient is on the cardiac monitor to monitor for signs of cardiac arrhythmia or significant cardiac changes particular after drug administration.       Clinical Course as of 02/05/20 [id number]  Thu Feb 05, 2020  0324 Alcohol, Ethyl (B): <10 [CF]  0429 Lipase: 37 [CF]  0429 Comprehensive metabolic panel(!) Normal comprehensive metabolic panel other than hyperglycemia.  Normal lipase.  Strongly suspect cyclic vomiting symptoms without an acute or emergent etiology.  Patient is feeling better after initial dose of droperidol and Benadryl; she states that the pain has mostly gone away but she is still nauseated.  She is still getting IV fluids.  I will consider an additional dose of droperidol but will give the existing medication a little bit more time to work. [CF]  [id number] Patient has been resting comfortably.  Heart rate has come down to the 80s.  Blood pressure is still high but better than before.  She said that she feels better than she did previously but is still nauseated.  She started to clear her throat and spit in a bag again once I walked in the room.  I will give her an additional dose of droperidol which I think will be beneficial as her fluids continue to run.  I anticipate discharge and outpatient follow-up. [CF]  0609 Cannabinoid 44 Ng, Ur [place](!): POSITIVE [CF]  0609 Urinalysis, Routine w reflex microscopic Urine, Clean Catch(!) No indication of urinary tract infection. [CF]  [id number] Patient resting comfortably, normal heart rate.  She is fairly somnolent right now after the second dose of droperidol.  However she woke up easily when asking she says she is feeling "a little" better.  No pain currently.  I advised her of her reassuring results with all of her lab work and explained she would be appropriate for discharge and outpatient  follow-up.  I told her I wrote her prescription for medicine that should help (promethazine and Bentyl).  She needs a  little bit more time to finish her fluids and to become slightly less somnolent.  At 7:00 AM I will transfer ED care to Dr. Hulan Saas to reassess and to discharge when appropriate. [CF]    Clinical Course User Index [CF] Hinda Kehr, MD     ____________________________________________  FINAL CLINICAL IMPRESSION(S) / ED DIAGNOSES  Final diagnoses:  Cyclic vomiting syndrome     MEDICATIONS GIVEN DURING THIS VISIT:  Medications  droperidol (INAPSINE) 2.5 MG/ML injection 2.5 mg (2.5 mg Intravenous Given 02/05/20 0309)  lactated ringers bolus 1,000 mL (1,000 mLs Intravenous New Bag/Given 02/05/20 0309)  diphenhydrAMINE (BENADRYL) injection 12.5 mg (12.5 mg Intravenous Given 02/05/20 0308)  droperidol (INAPSINE) 2.5 MG/ML injection 2.5 mg (2.5 mg Intravenous Given 02/05/20 0512)     ED Discharge Orders         Ordered    promethazine (PHENERGAN) 12.5 MG tablet  Every 6 hours PRN        02/05/20 0614    dicyclomine (BENTYL) 10 MG capsule  3 times daily PRN        02/05/20 0636          *Please note:  Amy Wolfe was evaluated in Emergency Department on 02/05/2020 for the symptoms described in the history of present illness. She was evaluated in the context of the global COVID-19 pandemic, which necessitated consideration that the patient might be at risk for infection with the SARS-CoV-2 virus that causes COVID-19. Institutional protocols and algorithms that pertain to the evaluation of patients at risk for COVID-19 are in a state of rapid change based on information released by regulatory bodies including the CDC and federal and state organizations. These policies and algorithms were followed during the patient's care in the ED.  Some ED evaluations and interventions may be delayed as a result of limited staffing during and after the pandemic.*  Note:  This  document was prepared using Dragon voice recognition software and may include unintentional dictation errors.   Hinda Kehr, MD 02/05/20 [phone]

## 2020-02-05 NOTE — Discharge Instructions (Signed)
Your workup in the Emergency Department today was reassuring.  We did not find any specific abnormalities.  We recommend you drink plenty of fluids, take your regular medications and/or any new ones prescribed today, and follow up with the doctor(s) listed in these documents as recommended.  Return to the Emergency Department if you develop new or worsening symptoms that concern you.  

## 2020-02-05 NOTE — ED Notes (Signed)
Pt given soda and crackers per request. Pt attempting to call family and boyfriend for ride.

## 2020-02-05 NOTE — ED Triage Notes (Signed)
Pt presents to ER c/o "pancreatitis flare up" that began Tuesday night after easting some fried chicken.  Pt has hx of pancreatitis in past.  Pt has been vomiting for last 2 days and has not been able to keep fluids or food down.  Pt denies alcohol consumption and has not smoked marijuana "in a week."  Pt A&Ox4 at this time.

## 2020-09-01 ENCOUNTER — Emergency Department: Payer: Medicaid Other

## 2020-09-01 ENCOUNTER — Inpatient Hospital Stay
Admission: EM | Admit: 2020-09-01 | Discharge: 2020-09-05 | DRG: 439 | Disposition: A | Discharge status: Home / Self Care | Payer: Medicaid Other | Attending: Internal Medicine | Admitting: Internal Medicine

## 2020-09-01 ENCOUNTER — Encounter: Payer: Self-pay | Admitting: Radiology

## 2020-09-01 ENCOUNTER — Inpatient Hospital Stay: Payer: Medicaid Other

## 2020-09-01 ENCOUNTER — Other Ambulatory Visit: Payer: Self-pay

## 2020-09-01 DIAGNOSIS — Z809 Family history of malignant neoplasm, unspecified: Secondary | ICD-10-CM | POA: Diagnosis not present

## 2020-09-01 DIAGNOSIS — K219 Gastro-esophageal reflux disease without esophagitis: Secondary | ICD-10-CM | POA: Diagnosis present

## 2020-09-01 DIAGNOSIS — F17200 Nicotine dependence, unspecified, uncomplicated: Secondary | ICD-10-CM | POA: Diagnosis present

## 2020-09-01 DIAGNOSIS — I160 Hypertensive urgency: Secondary | ICD-10-CM | POA: Diagnosis present

## 2020-09-01 DIAGNOSIS — F17210 Nicotine dependence, cigarettes, uncomplicated: Secondary | ICD-10-CM | POA: Diagnosis present

## 2020-09-01 DIAGNOSIS — I2510 Atherosclerotic heart disease of native coronary artery without angina pectoris: Secondary | ICD-10-CM | POA: Diagnosis present

## 2020-09-01 DIAGNOSIS — F12288 Cannabis dependence with other cannabis-induced disorder: Secondary | ICD-10-CM | POA: Diagnosis present

## 2020-09-01 DIAGNOSIS — Z7901 Long term (current) use of anticoagulants: Secondary | ICD-10-CM

## 2020-09-01 DIAGNOSIS — R911 Solitary pulmonary nodule: Secondary | ICD-10-CM | POA: Diagnosis present

## 2020-09-01 DIAGNOSIS — Z79899 Other long term (current) drug therapy: Secondary | ICD-10-CM | POA: Diagnosis not present

## 2020-09-01 DIAGNOSIS — K863 Pseudocyst of pancreas: Secondary | ICD-10-CM | POA: Diagnosis present

## 2020-09-01 DIAGNOSIS — R1013 Epigastric pain: Secondary | ICD-10-CM | POA: Diagnosis present

## 2020-09-01 DIAGNOSIS — Z86718 Personal history of other venous thrombosis and embolism: Secondary | ICD-10-CM

## 2020-09-01 DIAGNOSIS — I81 Portal vein thrombosis: Secondary | ICD-10-CM | POA: Diagnosis not present

## 2020-09-01 DIAGNOSIS — Z720 Tobacco use: Secondary | ICD-10-CM | POA: Diagnosis not present

## 2020-09-01 DIAGNOSIS — K861 Other chronic pancreatitis: Secondary | ICD-10-CM | POA: Diagnosis present

## 2020-09-01 DIAGNOSIS — Z8249 Family history of ischemic heart disease and other diseases of the circulatory system: Secondary | ICD-10-CM | POA: Diagnosis not present

## 2020-09-01 DIAGNOSIS — Z833 Family history of diabetes mellitus: Secondary | ICD-10-CM | POA: Diagnosis not present

## 2020-09-01 DIAGNOSIS — K859 Acute pancreatitis without necrosis or infection, unspecified: Principal | ICD-10-CM | POA: Diagnosis present

## 2020-09-01 DIAGNOSIS — I10 Essential (primary) hypertension: Secondary | ICD-10-CM | POA: Diagnosis present

## 2020-09-01 DIAGNOSIS — F1220 Cannabis dependence, uncomplicated: Secondary | ICD-10-CM | POA: Diagnosis present

## 2020-09-01 DIAGNOSIS — Z20822 Contact with and (suspected) exposure to covid-19: Secondary | ICD-10-CM | POA: Diagnosis present

## 2020-09-01 DIAGNOSIS — K8689 Other specified diseases of pancreas: Secondary | ICD-10-CM

## 2020-09-01 DIAGNOSIS — Z91148 Patient's other noncompliance with medication regimen for other reason: Secondary | ICD-10-CM

## 2020-09-01 DIAGNOSIS — Z9114 Patient's other noncompliance with medication regimen: Secondary | ICD-10-CM

## 2020-09-01 LAB — URINALYSIS, COMPLETE (UACMP) WITH MICROSCOPIC
Bilirubin Urine: NEGATIVE
Glucose, UA: NEGATIVE mg/dL
Hgb urine dipstick: NEGATIVE
Ketones, ur: 80 mg/dL — AB
Leukocytes,Ua: NEGATIVE
Nitrite: NEGATIVE
Protein, ur: 100 mg/dL — AB
Specific Gravity, Urine: 1.03 (ref 1.005–1.030)
pH: 5 (ref 5.0–8.0)

## 2020-09-01 LAB — URINE DRUG SCREEN, QUALITATIVE (ARMC ONLY)
Amphetamines, Ur Screen: NOT DETECTED
Barbiturates, Ur Screen: NOT DETECTED
Benzodiazepine, Ur Scrn: NOT DETECTED
Cannabinoid 50 Ng, Ur ~~LOC~~: POSITIVE — AB
Cocaine Metabolite,Ur ~~LOC~~: NOT DETECTED
MDMA (Ecstasy)Ur Screen: NOT DETECTED
Methadone Scn, Ur: NOT DETECTED
Opiate, Ur Screen: NOT DETECTED
Phencyclidine (PCP) Ur S: NOT DETECTED
Tricyclic, Ur Screen: NOT DETECTED

## 2020-09-01 LAB — CBC
HCT: 49.5 % — ABNORMAL HIGH (ref 36.0–46.0)
Hemoglobin: 16 g/dL — ABNORMAL HIGH (ref 12.0–15.0)
MCH: 27 pg (ref 26.0–34.0)
MCHC: 32.3 g/dL (ref 30.0–36.0)
MCV: 83.5 fL (ref 80.0–100.0)
Platelets: 267 10*3/uL (ref 150–400)
RBC: 5.93 MIL/uL — ABNORMAL HIGH (ref 3.87–5.11)
RDW: 14.6 % (ref 11.5–15.5)
WBC: 7.1 10*3/uL (ref 4.0–10.5)
nRBC: 0 % (ref 0.0–0.2)

## 2020-09-01 LAB — POC URINE PREG, ED: Preg Test, Ur: NEGATIVE

## 2020-09-01 LAB — RESP PANEL BY RT-PCR (FLU A&B, COVID) ARPGX2
Influenza A by PCR: NEGATIVE
Influenza B by PCR: NEGATIVE
SARS Coronavirus 2 by RT PCR: NEGATIVE

## 2020-09-01 LAB — COMPREHENSIVE METABOLIC PANEL
ALT: 146 U/L — ABNORMAL HIGH (ref 0–44)
AST: 96 U/L — ABNORMAL HIGH (ref 15–41)
Albumin: 4.3 g/dL (ref 3.5–5.0)
Alkaline Phosphatase: 306 U/L — ABNORMAL HIGH (ref 38–126)
Anion gap: 11 (ref 5–15)
BUN: 12 mg/dL (ref 6–20)
CO2: 27 mmol/L (ref 22–32)
Calcium: 9.8 mg/dL (ref 8.9–10.3)
Chloride: 102 mmol/L (ref 98–111)
Creatinine, Ser: 0.72 mg/dL (ref 0.44–1.00)
GFR, Estimated: >60 mL/min (ref >60)
Glucose, Bld: 176 mg/dL — ABNORMAL HIGH (ref 70–99)
Potassium: 3.5 mmol/L (ref 3.5–5.1)
Sodium: 140 mmol/L (ref 135–145)
Total Bilirubin: 0.8 mg/dL (ref 0.3–1.2)
Total Protein: 8.2 g/dL — ABNORMAL HIGH (ref 6.5–8.1)

## 2020-09-01 LAB — LIPASE, BLOOD: Lipase: 70 U/L — ABNORMAL HIGH (ref 11–51)

## 2020-09-01 MED ORDER — SODIUM CHLORIDE 0.9 % IV BOLUS
1000.0000 mL | Freq: Once | INTRAVENOUS | Status: AC
  Administered 2020-09-01: 1000 mL via INTRAVENOUS

## 2020-09-01 MED ORDER — HYDROMORPHONE HCL 1 MG/ML IJ SOLN
0.5000 mg | INTRAMUSCULAR | Status: DC | PRN
  Administered 2020-09-01: 0.5 mg via INTRAVENOUS
  Filled 2020-09-01: qty 1

## 2020-09-01 MED ORDER — HYDROMORPHONE HCL 1 MG/ML IJ SOLN
1.0000 mg | INTRAMUSCULAR | Status: DC | PRN
  Administered 2020-09-01 – 2020-09-04 (×15): 1 mg via INTRAVENOUS
  Filled 2020-09-01 (×15): qty 1

## 2020-09-01 MED ORDER — PANCRELIPASE (LIP-PROT-AMYL) 36000-114000 UNITS PO CPEP
72000.0000 [IU] | ORAL_CAPSULE | Freq: Three times a day (TID) | ORAL | Status: DC
  Administered 2020-09-03 – 2020-09-05 (×5): 72000 [IU] via ORAL
  Filled 2020-09-01 (×13): qty 2

## 2020-09-01 MED ORDER — DEXTROSE 5 % IN LACTATED RINGERS IV BOLUS
1000.0000 mL | Freq: Once | INTRAVENOUS | Status: AC
  Administered 2020-09-01: 1000 mL via INTRAVENOUS
  Filled 2020-09-01: qty 1000

## 2020-09-01 MED ORDER — MORPHINE SULFATE (PF) 4 MG/ML IV SOLN
4.0000 mg | INTRAVENOUS | Status: DC | PRN
  Administered 2020-09-01 – 2020-09-02 (×6): 4 mg via INTRAVENOUS
  Filled 2020-09-01 (×6): qty 1

## 2020-09-01 MED ORDER — IOHEXOL 350 MG/ML SOLN
100.0000 mL | Freq: Once | INTRAVENOUS | Status: AC | PRN
  Administered 2020-09-01: 100 mL via INTRAVENOUS

## 2020-09-01 MED ORDER — ONDANSETRON HCL 4 MG/2ML IJ SOLN
4.0000 mg | Freq: Four times a day (QID) | INTRAMUSCULAR | Status: DC | PRN
  Administered 2020-09-02 – 2020-09-04 (×5): 4 mg via INTRAVENOUS
  Filled 2020-09-01 (×6): qty 2

## 2020-09-01 MED ORDER — ONDANSETRON HCL 4 MG PO TABS: 4.0000 mg | ORAL_TABLET | Freq: Four times a day (QID) | ORAL | Status: DC | PRN

## 2020-09-01 MED ORDER — ENOXAPARIN SODIUM 40 MG/0.4ML IJ SOSY
40.0000 mg | PREFILLED_SYRINGE | INTRAMUSCULAR | Status: DC
  Administered 2020-09-01 – 2020-09-02 (×2): 40 mg via SUBCUTANEOUS
  Filled 2020-09-01 (×2): qty 0.4

## 2020-09-01 MED ORDER — DROPERIDOL 2.5 MG/ML IJ SOLN
2.5000 mg | Freq: Once | INTRAMUSCULAR | Status: AC
  Administered 2020-09-01: 2.5 mg via INTRAVENOUS
  Filled 2020-09-01: qty 2

## 2020-09-01 MED ORDER — METOCLOPRAMIDE HCL 5 MG/ML IJ SOLN
10.0000 mg | Freq: Four times a day (QID) | INTRAMUSCULAR | Status: AC
  Administered 2020-09-01 – 2020-09-02 (×4): 10 mg via INTRAVENOUS
  Filled 2020-09-01 (×5): qty 2

## 2020-09-01 MED ORDER — NICOTINE 21 MG/24HR TD PT24
21.0000 mg | MEDICATED_PATCH | Freq: Every day | TRANSDERMAL | Status: DC
  Filled 2020-09-01 (×3): qty 1

## 2020-09-01 MED ORDER — ONDANSETRON 4 MG PO TBDP
4.0000 mg | ORAL_TABLET | Freq: Once | ORAL | Status: AC | PRN
  Administered 2020-09-01: 4 mg via ORAL
  Filled 2020-09-01: qty 1

## 2020-09-01 MED ORDER — ACETAMINOPHEN 325 MG PO TABS: 650.0000 mg | ORAL_TABLET | Freq: Four times a day (QID) | ORAL | Status: DC | PRN

## 2020-09-01 MED ORDER — HYDRALAZINE HCL 20 MG/ML IJ SOLN: 10.0000 mg | Freq: Four times a day (QID) | INTRAMUSCULAR | Status: DC | PRN

## 2020-09-01 MED ORDER — PANCRELIPASE (LIP-PROT-AMYL) 36000-114000 UNITS PO CPEP
36000.0000 [IU] | ORAL_CAPSULE | Freq: Two times a day (BID) | ORAL | Status: DC | PRN
  Filled 2020-09-01: qty 1

## 2020-09-01 MED ORDER — SODIUM CHLORIDE 0.45 % IV SOLN
INTRAVENOUS | Status: DC
  Filled 2020-09-01: qty 1000

## 2020-09-01 MED ORDER — AMLODIPINE BESYLATE 10 MG PO TABS
10.0000 mg | ORAL_TABLET | Freq: Every day | ORAL | Status: DC
  Administered 2020-09-02 – 2020-09-04 (×3): 10 mg via ORAL
  Filled 2020-09-01 (×4): qty 1

## 2020-09-01 MED ORDER — PANTOPRAZOLE SODIUM 40 MG IV SOLR
40.0000 mg | INTRAVENOUS | Status: DC
  Administered 2020-09-01 – 2020-09-04 (×4): 40 mg via INTRAVENOUS
  Filled 2020-09-01 (×4): qty 40

## 2020-09-01 MED ORDER — GADOBUTROL 1 MMOL/ML IV SOLN
9.0000 mL | Freq: Once | INTRAVENOUS | Status: AC | PRN
  Administered 2020-09-01: 9 mL via INTRAVENOUS

## 2020-09-01 MED ORDER — METOCLOPRAMIDE HCL 5 MG/ML IJ SOLN
10.0000 mg | Freq: Once | INTRAMUSCULAR | Status: DC
  Filled 2020-09-01: qty 2

## 2020-09-01 NOTE — ED Notes (Signed)
Pt back from MRI 

## 2020-09-01 NOTE — ED Notes (Signed)
Report messaged to vanessa rn floor nurse

## 2020-09-01 NOTE — ED Notes (Signed)
Pt at end of bed, diaphoretic, unable to sit still. Pt placed on cardiac monitor. Pt continues to throw up on herself and pee.

## 2020-09-01 NOTE — ED Provider Notes (Signed)
[facility] Emergency Department Provider Note    Event Date/Time   First MD Initiated Contact with Patient 09/01/20 1036     (approximate)  I have reviewed the triage vital signs and the nursing notes.   HISTORY  Chief Complaint Nausea and Emesis    HPI Amy Wolfe is a 45 y.o. female with below listed past medical history presents to the ER for evaluation of nausea vomiting abdominal pain epigastric in nature.  Has a history of pancreatitis.  States that she smokes marijuana daily.  Did not drink any alcohol over the weekend.  States that symptoms started on Monday.  Has had some chills but no fevers.  No chest pain or shortness of breath.  Past Medical History:  Diagnosis Date   Abdominal pain    Anxiety    Coronary artery disease    Dyspnea    GERD (gastroesophageal reflux disease)    H/O blood clots    Hypertension    Liver abscess    Pancreatitis    PONV (postoperative nausea and vomiting) 11/07/2018   Thyroid disease    Uterine fibroid    Family History  Problem Relation Age of Onset   Hypertension Mother    Diabetes Mother    Cancer Father    Cancer Paternal Grandmother    Cancer Paternal Grandfather    Aneurysm Brother    Past Surgical History:  Procedure Laterality Date   CESAREAN SECTION     EMBOLIZATION N/A 06/14/2018   Procedure: Uterine Embolization;  Surgeon: Algernon Huxley, MD;  Location: [place];  Service: Cardiovascular;  Laterality: N/A;   laceration finger Right    LAPAROSCOPIC VAGINAL HYSTERECTOMY WITH SALPINGECTOMY Bilateral 10/28/2018   Procedure: LAPAROSCOPIC ASSISTED VAGINAL HYSTERECTOMY BILATERAL WITH SALPINGECTOMY;  Surgeon: Rubie Maid, MD;  Location: [facility];  Service: Gynecology;  Laterality: Bilateral;   THYROIDECTOMY, PARTIAL     VAGINAL HYSTERECTOMY Bilateral 10/28/2018   Procedure: HYSTERECTOMY VAGINAL WITH BILATERAL SALPINGECTOMY;  Surgeon: Rubie Maid, MD;  Location: [facility];   Service: Gynecology;  Laterality: Bilateral;   Patient Active Problem List   Diagnosis Date Noted   Cannabis hyperemesis syndrome concurrent with and due to cannabis dependence ([place]) 01/15/2020   Intractable vomiting with nausea 01/14/2020   GERD (gastroesophageal reflux disease) 03/30/2019   Tobacco abuse 03/30/2019   Abdominal pain 03/30/2019   Syncope 03/30/2019   PONV (postoperative nausea and vomiting) 11/07/2018   Vaginal cuff cellulitis 11/04/2018   Status post laparoscopic hysterectomy 10/28/2018   Anticoagulated 10/04/2018   Pelvic pain 10/04/2018   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   History of partial thyroidectomy 10/04/2018   Symptomatic anemia 06/13/2018   Swelling of limb 05/17/2018   Essential hypertension 05/17/2018   Pancreatitis, chronic ([place]) 05/17/2018   Portal vein thrombosis 05/17/2018      Prior to Admission medications   Medication Sig Start Date End Date Taking? Authorizing Provider  acetaminophen (TYLENOL 8 HOUR) 650 MG CR tablet Take 1 tablet (650 mg total) by mouth every 8 (eight) hours as needed for pain. 10/30/18   Rubie Maid, MD  amLODipine (NORVASC) 10 MG tablet Take 1 tablet (10 mg total) by mouth at bedtime. 05/13/19   Rubie Maid, MD  CREON 36000 units CPEP capsule TAKE 2 CAPSULES BY MOUTH WITH EACH MEAL AND 1 CAPSULE WITH [facility], [facility], [facility] Patient taking differently: Take 36,000-72,000 Units by mouth See admin instructions. Take 2 capsules (72000u) by mouth three times daily with meals  and take 1 capsule (36000u) by mouth twice daily with snacks 12/04/18   Lucilla Lame, MD  dicyclomine (BENTYL) 10 MG capsule Take 1 capsule (10 mg total) by mouth 3 (three) times daily as needed for up to 14 days for spasms. or abdominal pain 02/05/20 02/19/20  Hinda Kehr, MD  docusate sodium (COLACE) 100 MG capsule Take 1 capsule (100 mg total) by mouth 2 (two) times daily as needed. Patient taking differently: Take 100 mg by mouth daily.  10/04/18    Hubbard Hartshorn, FNP  Drospirenone (SLYND) 4 MG TABS Take 4 mg by mouth daily. 08/08/19   Rubie Maid, MD  ondansetron (ZOFRAN) 4 MG tablet Take 1 tablet (4 mg total) by mouth every 8 (eight) hours as needed for nausea or vomiting. 08/07/19   Rubie Maid, MD  oxyCODONE-acetaminophen (PERCOCET) 5-325 MG tablet Take 1 tablet by mouth every 4 (four) hours as needed. 01/10/20   Rudene Re, MD  pantoprazole (PROTONIX) 40 MG tablet Take 1 tablet (40 mg total) by mouth daily. 05/13/19   Rubie Maid, MD  promethazine (PHENERGAN) 12.5 MG tablet Take 1 tablet (12.5 mg total) by mouth every 6 (six) hours as needed for nausea or vomiting. 02/05/20   Hinda Kehr, MD  XARELTO 20 MG TABS tablet TAKE 1 TABLET BY MOUTH ONCE DAILY WITH SUPPER Patient taking differently: Take 20 mg by mouth daily with supper. 10/22/19   Kris Hartmann, NP    Allergies Patient has no known allergies.    Social History Social History   Tobacco Use   Smoking status: Every Day    Packs/day: 0.50    Years: 35.00    Pack years: 17.50    Types: Cigarettes   Smokeless tobacco: Never  Vaping Use   Vaping Use: Never used  Substance Use Topics   Alcohol use: Not Currently   Drug use: Yes    Types: Marijuana    Comment: daily    Review of Systems Patient denies headaches, rhinorrhea, blurry vision, numbness, shortness of breath, chest pain, edema, cough, abdominal pain, nausea, vomiting, diarrhea, dysuria, fevers, rashes or hallucinations unless otherwise stated above in HPI. ____________________________________________   PHYSICAL EXAM:  VITAL SIGNS: Vitals:   09/01/20 1400 09/01/20 1430  BP: (!) 165/83 (!) 181/105  Pulse: (!) 53 (!) 51  Resp: 17 19  Temp:    SpO2: 99% 99%    Constitutional: Alert and oriented.  Uncomfortable appearing Eyes: Conjunctivae are normal.  Head: Atraumatic. Nose: No congestion/rhinnorhea. Mouth/Throat: Mucous membranes are moist.   Neck: No stridor. Painless ROM.   Cardiovascular: Normal rate, regular rhythm. Grossly normal heart sounds.  Good peripheral circulation. Respiratory: Normal respiratory effort.  No retractions. Lungs CTAB. Gastrointestinal: Soft with mild epigastric ttp. No distention. No abdominal bruits. No CVA tenderness. Genitourinary:  Musculoskeletal: No lower extremity tenderness nor edema.  No joint effusions. Neurologic:  Normal speech and language. No gross focal neurologic deficits are appreciated. No facial droop Skin:  Skin is warm, dry and intact. No rash noted. Psychiatric: Mood and affect are normal. Speech and behavior are normal.  ____________________________________________   LABS (all labs ordered are listed, but only abnormal results are displayed)  Results for orders placed or performed during the hospital encounter of 09/01/20 (from the past 24 hour(s))  Lipase, blood     Status: Abnormal   Collection Time: 09/01/20 10:14 AM  Result Value Ref Range   Lipase 70 (H) 11 - 51 U/L  Comprehensive metabolic panel  Status: Abnormal   Collection Time: 09/01/20 10:14 AM  Result Value Ref Range   Sodium 140 135 - 145 mmol/L   Potassium 3.5 3.5 - 5.1 mmol/L   Chloride 102 98 - 111 mmol/L   CO2 27 22 - 32 mmol/L   Glucose, Bld 176 (H) 70 - 99 mg/dL   BUN 12 6 - 20 mg/dL   Creatinine, Ser 0.72 0.44 - 1.00 mg/dL   Calcium 9.8 8.9 - 10.3 mg/dL   Total Protein 8.2 (H) 6.5 - 8.1 g/dL   Albumin 4.3 3.5 - 5.0 g/dL   AST 96 (H) 15 - 41 U/L   ALT 146 (H) 0 - 44 U/L   Alkaline Phosphatase 306 (H) 38 - 126 U/L   Total Bilirubin 0.8 0.3 - 1.2 mg/dL   GFR, Estimated >60 >60 mL/min   Anion gap 11 5 - 15  CBC     Status: Abnormal   Collection Time: 09/01/20 10:14 AM  Result Value Ref Range   WBC 7.1 4.0 - 10.5 K/uL   RBC 5.93 (H) 3.87 - 5.11 MIL/uL   Hemoglobin 16.0 (H) 12.0 - 15.0 g/dL   HCT 49.5 (H) 36.0 - 46.0 %   MCV 83.5 80.0 - 100.0 fL   MCH 27.0 26.0 - 34.0 pg   MCHC 32.3 30.0 - 36.0 g/dL   RDW 14.6 11.5 - 15.5  %   Platelets 267 150 - 400 K/uL   nRBC 0.0 0.0 - 0.2 %  Urinalysis, Complete w Microscopic     Status: Abnormal   Collection Time: 09/01/20 11:20 AM  Result Value Ref Range   Color, Urine AMBER (A) YELLOW   APPearance CLOUDY (A) CLEAR   Specific Gravity, Urine 1.030 1.005 - 1.030   pH 5.0 5.0 - 8.0   Glucose, UA NEGATIVE NEGATIVE mg/dL   Hgb urine dipstick NEGATIVE NEGATIVE   Bilirubin Urine NEGATIVE NEGATIVE   Ketones, ur 80 (A) NEGATIVE mg/dL   Protein, ur 100 (A) NEGATIVE mg/dL   Nitrite NEGATIVE NEGATIVE   Leukocytes,Ua NEGATIVE NEGATIVE   RBC / HPF 0-5 0 - 5 RBC/hpf   WBC, UA 0-5 0 - 5 WBC/hpf   Bacteria, UA FEW (A) NONE SEEN   Squamous Epithelial / LPF 21-50 0 - 5   Mucus PRESENT   Urine Drug Screen, Qualitative ([facility] only)     Status: Abnormal   Collection Time: 09/01/20 11:20 AM  Result Value Ref Range   Tricyclic, Ur Screen NONE DETECTED NONE DETECTED   Amphetamines, Ur Screen NONE DETECTED NONE DETECTED   MDMA (Ecstasy)Ur Screen NONE DETECTED NONE DETECTED   Cocaine Metabolite,Ur [place] NONE DETECTED NONE DETECTED   Opiate, Ur Screen NONE DETECTED NONE DETECTED   Phencyclidine (PCP) Ur S NONE DETECTED NONE DETECTED   Cannabinoid 50 Ng, Ur [place] POSITIVE (A) NONE DETECTED   Barbiturates, Ur Screen NONE DETECTED NONE DETECTED   Benzodiazepine, Ur Scrn NONE DETECTED NONE DETECTED   Methadone Scn, Ur NONE DETECTED NONE DETECTED  POC urine preg, ED     Status: None   Collection Time: 09/01/20 11:23 AM  Result Value Ref Range   Preg Test, Ur NEGATIVE NEGATIVE   ____________________________________________  EKG My review and personal interpretation at Time: 10:15   Indication: n/v  Rate: 110  Rhythm: sinus Axis: normal Other: normal intervals, no stemi ____________________________________________  RADIOLOGY  I personally reviewed all radiographic images ordered to evaluate for the above acute complaints and reviewed radiology reports and findings.  These findings  were  personally discussed with the patient.  Please see medical record for radiology report. ____________________________________________   PROCEDURES  Procedure(s) performed:  Procedures    Critical Care performed: no ____________________________________________   INITIAL IMPRESSION / ASSESSMENT AND PLAN / ED COURSE  Pertinent labs & imaging results that were available during my care of the patient were reviewed by me and considered in my medical decision making (see chart for details).   DDX: Cyclic vomiting, pancreatitis, cholelithiasis, hepatitis, enteritis, SBO  Amy Wolfe is a 45 y.o. who presents to the ED with symptoms as described above.  Patient with several days of persistent vomiting though she is afebrile hemodynamically stable give IV fluids will give IV droperidol given her history of cyclic vomiting syndrome will give pain medication.  Will order CT imaging as her lipase is elevated and evaluate for signs of pancreatitis.  Clinical Course as of 09/01/20 1519  Wed Sep 01, 2020  1447 Patient required multiple rounds of IV narcotic medication for pain control.  Still having persistent nausea and vomiting not tolerating p.o.  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  Will order MRI per radiology recommendation.  She is clinically presenting like acute pancreatitis will discuss with hospitalist for admission. [PR]    Clinical Course User Index [PR] Merlyn Lot, MD    The patient was evaluated in Emergency Department today for the symptoms described in the history of present illness. He/she was evaluated in the context of the global COVID-19 pandemic, which necessitated consideration that the patient might be at risk for infection with the SARS-CoV-2 virus that causes COVID-19. Institutional protocols and algorithms that pertain to the evaluation of patients at risk for COVID-19 are in a state of rapid change based on information released  by regulatory bodies including the CDC and federal and state organizations. These policies and algorithms were followed during the patient's care in the ED.  As part of my medical decision making, I reviewed the following data within the [place] notes reviewed and incorporated, Labs reviewed, notes from prior ED visits and [place] Controlled Substance Database   ____________________________________________   FINAL CLINICAL IMPRESSION(S) / ED DIAGNOSES  Final diagnoses:  Acute pancreatitis, unspecified complication status, unspecified pancreatitis type  Pancreatic mass      NEW MEDICATIONS STARTED DURING THIS VISIT:  New Prescriptions   No medications on file     Note:  This document was prepared using Dragon voice recognition software and may include unintentional dictation errors.    Merlyn Lot, MD 09/01/20 [phone]

## 2020-09-01 NOTE — ED Notes (Signed)
Patient transported to MRI 

## 2020-09-01 NOTE — ED Triage Notes (Signed)
Pt comes ems from home with n/v, abd pain for 3 days. Has hx of pancreatitis and kidney issues. Hasn't had her meds for 2 months. Has eaten in 2-3 days. BP 166/104 HR 90 99% RA. CBG 196. '4mg'$  zofran IM.

## 2020-09-01 NOTE — ED Triage Notes (Signed)
Pt here with N/V/D x3 days. Pt moaning in triage. Pt believes that it is her pancreatitis. Pt also states she has hx of blood clots and cysts. Pt abd pain is centered and radiates to her back.

## 2020-09-01 NOTE — H&P (Addendum)
History and Physical    Amy Wolfe [id number] DOB: [birth year] DOA: 09/01/2020  PCP: Hubbard Hartshorn, FNP   Patient coming from: Home  I have personally briefly reviewed patient's old medical records in [place]  Chief Complaint: Abdominal pain  HPI: Amy Wolfe is a 45 y.o. 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  She rates her pain a 10 x 10 in intensity at its worst associated with nausea, vomiting and inability to tolerate any oral intake.  According to the patient her last meal was about 3 days ago. She denies having any fever or chills, no cough, no chest pain, no shortness of breath, no urinary frequency, no nocturia, no dysuria, no headache, no leg swelling, no blurred vision, no focal deficits. Labs show sodium 140, potassium 3.5, chloride 102, bicarb 27, glucose 176, BUN 12, creatinine 0.72, calcium 9.8, alkaline phosphatase 306, albumin 4.3, lipase 70, AST 96, ALT 146, total protein 8.2, white count 7.1, hemoglobin 16, hematocrit 49.5, MCV 83.5, RDW 14.6, platelet count 267 Urine analysis is sterile and urine pregnancy test is negative Urine toxicology is positive for cannabinoids CT scan of abdomen and pelvis shows new 3 cm hypodense mass lesion in the head of the pancreas highly suspicious for underlying neoplasm. Further evaluation by means of contrast enhanced pancreas MR is recommended. 5 mm nodule in the right lower lobe stable dating back to 2020 and consistent with a benign etiology. Diverticulosis without diverticulitis. Twelve-lead EKG reviewed by me shows sinus tachycardia   ED Course: Patient is a 45 year old female with multiple medical problems as well as a history of alcoholic pancreatitis who presents to the ER for  evaluation of a 3-day history of abdominal pain mostly in the epigastrium associated with nausea and vomiting. She states that she quit drinking over a year ago. Lipase levels are slightly elevated and imaging shows a hypodense mass lesion in the head of the pancreas highly suspicious for underlying neoplasm. She will be admitted to the hospital for further evaluation.   Review of Systems: As per HPI otherwise all other systems reviewed and negative.    Past Medical History:  Diagnosis Date   Abdominal pain    Anxiety    Coronary artery disease    Dyspnea    GERD (gastroesophageal reflux disease)    H/O blood clots    Hypertension    Liver abscess    Pancreatitis    PONV (postoperative nausea and vomiting) 11/07/2018   Thyroid disease    Uterine fibroid     Past Surgical History:  Procedure Laterality Date   CESAREAN SECTION     EMBOLIZATION N/A 06/14/2018   Procedure: Uterine Embolization;  Surgeon: Algernon Huxley, MD;  Location: [facility];  Service: Cardiovascular;  Laterality: N/A;   laceration finger Right    LAPAROSCOPIC VAGINAL HYSTERECTOMY WITH SALPINGECTOMY Bilateral 10/28/2018   Procedure: LAPAROSCOPIC ASSISTED VAGINAL HYSTERECTOMY BILATERAL WITH SALPINGECTOMY;  Surgeon: Rubie Maid, MD;  Location: [facility];  Service: Gynecology;  Laterality: Bilateral;   THYROIDECTOMY, PARTIAL     VAGINAL HYSTERECTOMY Bilateral 10/28/2018   Procedure: HYSTERECTOMY VAGINAL WITH BILATERAL SALPINGECTOMY;  Surgeon: Rubie Maid, MD;  Location: [facility];  Service: Gynecology;  Laterality: Bilateral;     reports that she has been smoking cigarettes. She has a 17.50 pack-year smoking history.  She has never used smokeless tobacco. She reports that she does not currently use alcohol. She reports current drug use. Drug: Marijuana.  No Known Allergies  Family History  Problem Relation Age of Onset   Hypertension Mother    Diabetes Mother    Cancer Father    Cancer Paternal  Grandmother    Cancer Paternal Grandfather    Aneurysm Brother       Prior to Admission medications   Medication Sig Start Date End Date Taking? Authorizing Provider  acetaminophen (TYLENOL 8 HOUR) 650 MG CR tablet Take 1 tablet (650 mg total) by mouth every 8 (eight) hours as needed for pain. 10/30/18   Rubie Maid, MD  amLODipine (NORVASC) 10 MG tablet Take 1 tablet (10 mg total) by mouth at bedtime. 05/13/19   Rubie Maid, MD  CREON 36000 units CPEP capsule TAKE 2 CAPSULES BY MOUTH WITH EACH MEAL AND 1 CAPSULE WITH [facility] Patient taking differently: Take 36,000-72,000 Units by mouth See admin instructions. Take 2 capsules (72000u) by mouth three times daily with meals and take 1 capsule (36000u) by mouth twice daily with snacks 12/04/18   Lucilla Lame, MD  dicyclomine (BENTYL) 10 MG capsule Take 1 capsule (10 mg total) by mouth 3 (three) times daily as needed for up to 14 days for spasms. or abdominal pain 02/05/20 02/19/20  Hinda Kehr, MD  docusate sodium (COLACE) 100 MG capsule Take 1 capsule (100 mg total) by mouth 2 (two) times daily as needed. Patient taking differently: Take 100 mg by mouth daily.  10/04/18   Hubbard Hartshorn, FNP  Drospirenone (SLYND) 4 MG TABS Take 4 mg by mouth daily. 08/08/19   Rubie Maid, MD  ondansetron (ZOFRAN) 4 MG tablet Take 1 tablet (4 mg total) by mouth every 8 (eight) hours as needed for nausea or vomiting. 08/07/19   Rubie Maid, MD  oxyCODONE-acetaminophen (PERCOCET) 5-325 MG tablet Take 1 tablet by mouth every 4 (four) hours as needed. 01/10/20   Rudene Re, MD  pantoprazole (PROTONIX) 40 MG tablet Take 1 tablet (40 mg total) by mouth daily. 05/13/19   Rubie Maid, MD  promethazine (PHENERGAN) 12.5 MG tablet Take 1 tablet (12.5 mg total) by mouth every 6 (six) hours as needed for nausea or vomiting. 02/05/20   Hinda Kehr, MD  XARELTO 20 MG TABS tablet TAKE 1 TABLET BY MOUTH ONCE DAILY WITH SUPPER Patient taking differently: Take 20 mg by  mouth daily with supper. 10/22/19   Kris Hartmann, NP    Physical Exam: Vitals:   09/01/20 1242 09/01/20 1400 09/01/20 1430 09/01/20 1500  BP: (!) 151/92 (!) 165/83 (!) 181/105 (!) 187/102  Pulse: 69 (!) 53 (!) 51 (!) 51  Resp: '19 17 19 15  '$ Temp:      TempSrc:      SpO2: 98% 99% 99% 93%  Weight:      Height:         Vitals:   09/01/20 1242 09/01/20 1400 09/01/20 1430 09/01/20 1500  BP: (!) 151/92 (!) 165/83 (!) 181/105 (!) 187/102  Pulse: 69 (!) 53 (!) 51 (!) 51  Resp: '19 17 19 15  '$ Temp:      TempSrc:      SpO2: 98% 99% 99% 93%  Weight:      Height:          Constitutional: Alert and oriented x 3 .  Appears very restless and uncomfortable HEENT:      Head: Normocephalic and atraumatic.  Eyes: PERLA, EOMI, Conjunctivae are normal. Sclera is non-icteric.       Mouth/Throat: Mucous membranes are moist.       Neck: Supple with no signs of meningismus. Cardiovascular: Regular rate and rhythm. No murmurs, gallops, or rubs. 2+ symmetrical distal pulses are present . No JVD. No LE edema Respiratory: Respiratory effort normal .Lungs sounds clear bilaterally. No wheezes, crackles, or rhonchi.  Gastrointestinal: Soft, epigastric tenderness, and non distended with positive bowel sounds.  Genitourinary: No CVA tenderness. Musculoskeletal: Nontender with normal range of motion in all extremities. No cyanosis, or erythema of extremities. Neurologic:  Face is symmetric. Moving all extremities. No gross focal neurologic deficits . Skin: Skin is warm, dry.  No rash or ulcers Psychiatric: Mood and affect are normal    Labs on Admission: I have personally reviewed following labs and imaging studies  CBC: Recent Labs  Lab 09/01/20 1014  WBC 7.1  HGB 16.0*  HCT 49.5*  MCV 83.5  PLT 99991111   Basic Metabolic Panel: Recent Labs  Lab 09/01/20 1014  NA 140  K 3.5  CL 102  CO2 27  GLUCOSE 176*  BUN 12  CREATININE 0.72  CALCIUM 9.8   GFR: Estimated Creatinine  Clearance: 108.6 mL/min (by C-G formula based on SCr of 0.72 mg/dL). Liver Function Tests: Recent Labs  Lab 09/01/20 1014  AST 96*  ALT 146*  ALKPHOS 306*  BILITOT 0.8  PROT 8.2*  ALBUMIN 4.3   Recent Labs  Lab 09/01/20 1014  LIPASE 70*   No results for input(s): AMMONIA in the last 168 hours. Coagulation Profile: No results for input(s): INR, PROTIME in the last 168 hours. Cardiac Enzymes: No results for input(s): CKTOTAL, CKMB, CKMBINDEX, TROPONINI in the last 168 hours. BNP (last 3 results) No results for input(s): PROBNP in the last 8760 hours. HbA1C: No results for input(s): HGBA1C in the last 72 hours. CBG: No results for input(s): GLUCAP in the last 168 hours. Lipid Profile: No results for input(s): CHOL, HDL, LDLCALC, TRIG, CHOLHDL, LDLDIRECT in the last 72 hours. Thyroid Function Tests: No results for input(s): TSH, T4TOTAL, FREET4, T3FREE, THYROIDAB in the last 72 hours. Anemia Panel: No results for input(s): VITAMINB12, FOLATE, FERRITIN, TIBC, IRON, RETICCTPCT in the last 72 hours. Urine analysis:    Component Value Date/Time   COLORURINE AMBER (A) 09/01/2020 1120   APPEARANCEUR CLOUDY (A) 09/01/2020 1120   LABSPEC 1.030 09/01/2020 1120   PHURINE 5.0 09/01/2020 1120   GLUCOSEU NEGATIVE 09/01/2020 1120   HGBUR NEGATIVE 09/01/2020 1120   BILIRUBINUR NEGATIVE 09/01/2020 1120   KETONESUR 80 (A) 09/01/2020 1120   PROTEINUR 100 (A) 09/01/2020 1120   NITRITE NEGATIVE 09/01/2020 1120   LEUKOCYTESUR NEGATIVE 09/01/2020 1120    Radiological Exams on Admission: CT ABDOMEN PELVIS W CONTRAST  Result Date: 09/01/2020 CLINICAL DATA:  Nausea and vomiting with abdominal pain for several days, history of prior pancreatitis EXAM: CT ABDOMEN AND PELVIS WITH CONTRAST TECHNIQUE: Multidetector CT imaging of the abdomen and pelvis was performed using the standard protocol following bolus administration of intravenous contrast. CONTRAST:  158m OMNIPAQUE IOHEXOL 350 MG/ML SOLN  COMPARISON:  01/14/2020 FINDINGS: Lower chest: Small right lower lobe nodule is noted measuring less than 5 mm best seen on image number 8 of series 4. This is stable in retrospect dating back to at least 2020 and felt to be benign in etiology. Hepatobiliary: No focal liver abnormality is seen. No gallstones, gallbladder wall thickening, or biliary dilatation. Pancreas: There again noted changes consistent  with chronic pancreatitis with dilatation of the more peripheral pancreatic duct. There is however now seen on the current exam a focal 3 cm mass lesion with peripheral enhancement and central decreased attenuation in the region of the head of the pancreas. This may represent interval growth of the hypodense area in the region of the head of the pancreas seen on prior exam. Spleen: Normal in size without focal abnormality. Adrenals/Urinary Tract: Adrenal glands are within normal limits. Kidneys are well visualized bilaterally. No renal calculi or obstructive changes are seen. Bladder is decompressed. Stomach/Bowel: Scattered diverticular change of the colon is noted. No diverticulitis is seen. Appendix is within normal limits. Small bowel and stomach are unremarkable. Vascular/Lymphatic: Aortic atherosclerosis. No enlarged abdominal or pelvic lymph nodes. Reproductive: Status post hysterectomy. No adnexal masses. Other: No abdominal wall hernia or abnormality. No abdominopelvic ascites. Musculoskeletal: No acute abnormality noted. IMPRESSION: New 3 cm hypodense mass lesion in the head of the pancreas highly suspicious for underlying neoplasm. Further evaluation by means of contrast enhanced pancreas MR is recommended. 5 mm nodule in the right lower lobe stable dating back to 2020 and consistent with a benign etiology. Diverticulosis without diverticulitis. Electronically Signed   By: Inez Catalina M.D.   On: 09/01/2020 13:49     Assessment/Plan Principal Problem:   Acute on chronic pancreatitis (HCC) Active  Problems:   GERD (gastroesophageal reflux disease)   Tobacco abuse   Cannabis hyperemesis syndrome concurrent with and due to cannabis dependence (HCC)   Nicotine dependence   Non compliance w medication regimen   Hypertensive urgency      Acute on chronic pancreatitis/cannabis hyperemesis syndrome due to cannabis dependence Patient presents to the ER for evaluation of a 3-day history of worsening epigastric pain associated with nausea, vomiting and poor oral intake. She has a history of chronic pancreatitis and lipase is slightly elevated at 70 but she also has a history of cannabis dependence and her refractory nausea and vomiting may be secondary to same. We will keep patient n.p.o. Supportive care with antiemetics, IV fluid hydration and IV PPI as well as pain control     Hypertensive urgency Secondary to medication noncompliance Will place the patient on hydralazine 10 mg IV every 6 hours and resume amlodipine 10 mg daily    Nicotine dependence Smoking cessation has been discussed with patient in detail Place patient on nicotine transdermal patch 21 mg daily    Pancreatic mass Patient noted to have a new 3 cm hypodense mass lesion in the head of the pancreas highly suspicious for underlying neoplasm Follow-up results of MRA for further evaluation   Medication noncompliance Patient states that she has been off her meds for about 2 months We will request TOC consult  DVT prophylaxis: Lovenox  Code Status: full code  Family Communication: Greater than 50% of time was spent discussing patient's condition and plan of care with her at the bedside.  All questions and concerns have been addressed.  She verbalizes understanding and agrees with the plan. Disposition Plan: Back to previous home environment Consults called: none  Status: At the time of admission, it appears that the appropriate admission status for this patient is inpatient. This is judged to be reasonable and  necessary in order to provide the required intensity of service to ensure the patient's safety given the presenting symptoms, physical exam findings, and initial radiographic and laboratory data in the context of their comorbid conditions. Patient requires inpatient status due to high intensity of service,  high risk for further deterioration and high frequency of surveillance required.    Collier Bullock MD [facility]     09/01/2020, 4:11 PM

## 2020-09-02 DIAGNOSIS — R911 Solitary pulmonary nodule: Secondary | ICD-10-CM

## 2020-09-02 DIAGNOSIS — F12288 Cannabis dependence with other cannabis-induced disorder: Secondary | ICD-10-CM

## 2020-09-02 DIAGNOSIS — Z720 Tobacco use: Secondary | ICD-10-CM

## 2020-09-02 DIAGNOSIS — K863 Pseudocyst of pancreas: Secondary | ICD-10-CM

## 2020-09-02 DIAGNOSIS — I160 Hypertensive urgency: Secondary | ICD-10-CM

## 2020-09-02 LAB — CBC
HCT: 43.3 % (ref 36.0–46.0)
Hemoglobin: 14 g/dL (ref 12.0–15.0)
MCH: 27 pg (ref 26.0–34.0)
MCHC: 32.3 g/dL (ref 30.0–36.0)
MCV: 83.6 fL (ref 80.0–100.0)
Platelets: 237 10*3/uL (ref 150–400)
RBC: 5.18 MIL/uL — ABNORMAL HIGH (ref 3.87–5.11)
RDW: 14.7 % (ref 11.5–15.5)
WBC: 10.7 10*3/uL — ABNORMAL HIGH (ref 4.0–10.5)
nRBC: 0 % (ref 0.0–0.2)

## 2020-09-02 LAB — BASIC METABOLIC PANEL
Anion gap: 7 (ref 5–15)
BUN: 14 mg/dL (ref 6–20)
CO2: 27 mmol/L (ref 22–32)
Calcium: 8.7 mg/dL — ABNORMAL LOW (ref 8.9–10.3)
Chloride: 106 mmol/L (ref 98–111)
Creatinine, Ser: 0.69 mg/dL (ref 0.44–1.00)
GFR, Estimated: >60 mL/min (ref >60)
Glucose, Bld: 107 mg/dL — ABNORMAL HIGH (ref 70–99)
Potassium: 3.7 mmol/L (ref 3.5–5.1)
Sodium: 140 mmol/L (ref 135–145)

## 2020-09-02 LAB — LIPASE, BLOOD: Lipase: 31 U/L (ref 11–51)

## 2020-09-02 MED ORDER — OXYCODONE HCL 5 MG PO TABS
5.0000 mg | ORAL_TABLET | ORAL | Status: DC | PRN
  Administered 2020-09-02 – 2020-09-04 (×7): 5 mg via ORAL
  Filled 2020-09-02 (×7): qty 1

## 2020-09-02 MED ORDER — CAPSAICIN 0.025 % EX CREA
TOPICAL_CREAM | Freq: Two times a day (BID) | CUTANEOUS | Status: DC
  Filled 2020-09-02: qty 60

## 2020-09-02 MED ORDER — ACETAMINOPHEN 325 MG PO TABS: 650.0000 mg | ORAL_TABLET | Freq: Four times a day (QID) | ORAL | Status: DC | PRN

## 2020-09-02 NOTE — Progress Notes (Signed)
Patient ID: Amy Wolfe, female   DOB: [birth year], 45 y.o.   MRN: [id number] [facility] PROGRESS NOTE  Amy Wolfe [id number] DOB: 01-Dec-1975 DOA: 09/01/2020 PCP: Hubbard Hartshorn, FNP  HPI/Subjective: Patient still has abdominal pain.  The pancreatic mass on MRI is consistent with a pancreatic pseudocyst.  Patient admitted with acute on chronic pancreatitis.  Objective: Vitals:   09/02/20 0753 09/02/20 1536  BP: 114/84 139/86  Pulse: (!) 52 (!) 58  Resp: 15 15  Temp: 98.4 F (36.9 C) 98 F (36.7 C)  SpO2: 97% 99%    Intake/Output Summary (Last 24 hours) at 09/02/2020 1711 Last data filed at 09/02/2020 0319 Gross per 24 hour  Intake 428.9 ml  Output --  Net 428.9 ml   Filed Weights   09/01/20 1012  Weight: 95.3 kg    ROS: Review of Systems  Respiratory:  Negative for shortness of breath.   Cardiovascular:  Negative for chest pain.  Gastrointestinal:  Positive for abdominal pain. Negative for nausea and vomiting.  Exam: Physical Exam HENT:     Head: Normocephalic.     Mouth/Throat:     Pharynx: No oropharyngeal exudate.  Eyes:     General: Lids are normal.     Conjunctiva/sclera: Conjunctivae normal.     Pupils: Pupils are equal, round, and reactive to light.  Cardiovascular:     Rate and Rhythm: Normal rate and regular rhythm.     Heart sounds: Normal heart sounds, S1 normal and S2 normal.  Pulmonary:     Breath sounds: No decreased breath sounds, wheezing, rhonchi or rales.  Abdominal:     Palpations: Abdomen is soft.     Tenderness: There is generalized abdominal tenderness.  Musculoskeletal:     Right lower leg: No swelling.     Left lower leg: No swelling.  Skin:    General: Skin is warm.     Findings: No rash.  Neurological:     Mental Status: She is alert and oriented to person, place, and time.      Scheduled Meds:  amLODipine  10 mg Oral QHS   capsaicin   Topical BID   enoxaparin (LOVENOX) injection  40 mg  Subcutaneous Q24H   lipase/protease/amylase  72,000 Units Oral TID AC   nicotine  21 mg Transdermal Daily   pantoprazole (PROTONIX) IV  40 mg Intravenous Q24H   Continuous Infusions:  sodium chloride 100 mL/hr at 09/01/20 2043    Assessment/Plan:  Acute on chronic pancreatitis.  IV fluid hydration.  Lipase trended into the normal range.  Started clear liquid diet today and will advance to full liquid diet this evening.  As needed pain control with IV and oral medications.  Continue pancreatic enzymes. Pancreatic pseudocyst seen on MRI measuring 2.8 x 2.8 cm.  Radiology recommends follow-up imaging in 3 months.  Will likely need referral to gastroenterologist that does endoscopic ultrasounds. Cannabis hyperemesis syndrome start capsaicin topically Hypertensive urgency on presentation started on Norvasc Pulmonary nodule right lower lobe 5 mm stable dating back to 2020 consistent with benign etiology. Tobacco abuse.  Nicotine patch if she wants it.   Code Status:     Code Status Orders  (From admission, onward)           Start     Ordered   09/01/20 1618  Full code  Continuous        09/01/20 1619           Code Status History  Date Active Date Inactive Code Status Order ID Comments User Context   01/14/2020 1930 01/16/2020 1244 Full Code [id number]  Criss Alvine, DO ED   01/09/2020 1643 01/10/2020 1344 Full Code [id number]  Carrie Mew, MD ED   03/30/2019 1701 03/31/2019 1652 Full Code [id number]  Ivor Costa, MD Inpatient   11/04/2018 2020 11/11/2018 1656 Full Code [id number]  Rubie Maid, MD ED   10/28/2018 1435 10/30/2018 1437 Full Code [id number]  Rubie Maid, MD Inpatient   06/13/2018 2012 06/17/2018 2134 Full Code [id number]  Salary, Avel Peace, MD Inpatient      Family Communication: Patient tried to reach somebody while is in the room with her but unable to get on the phone with him.  She would not tell me who she tried to contact. Disposition Plan: Status is:  Inpatient  Dispo: The patient is from: Home              Anticipated d/c is to: Home              Patient currently being treated for acute on chronic pancreatitis with pseudocyst.   Difficult to place patient.  No.  Time spent: 28 minutes  [place]

## 2020-09-02 NOTE — Plan of Care (Signed)

## 2020-09-02 NOTE — Progress Notes (Signed)
Patient arrived to room 133 in stable condition. Alert and oriented x4. She reports 7/10 abdominal pain and intermittent nausea. Plan of care reviewed with patient. NPO. Room/unit orientation completed. Call bell within reach.

## 2020-09-03 DIAGNOSIS — I81 Portal vein thrombosis: Secondary | ICD-10-CM

## 2020-09-03 LAB — COMPREHENSIVE METABOLIC PANEL
ALT: 62 U/L — ABNORMAL HIGH (ref 0–44)
AST: 28 U/L (ref 15–41)
Albumin: 3.4 g/dL — ABNORMAL LOW (ref 3.5–5.0)
Alkaline Phosphatase: 194 U/L — ABNORMAL HIGH (ref 38–126)
Anion gap: 6 (ref 5–15)
BUN: 11 mg/dL (ref 6–20)
CO2: 28 mmol/L (ref 22–32)
Calcium: 8.2 mg/dL — ABNORMAL LOW (ref 8.9–10.3)
Chloride: 103 mmol/L (ref 98–111)
Creatinine, Ser: 0.77 mg/dL (ref 0.44–1.00)
GFR, Estimated: >60 mL/min (ref >60)
Glucose, Bld: 94 mg/dL (ref 70–99)
Potassium: 3.1 mmol/L — ABNORMAL LOW (ref 3.5–5.1)
Sodium: 137 mmol/L (ref 135–145)
Total Bilirubin: 1.4 mg/dL — ABNORMAL HIGH (ref 0.3–1.2)
Total Protein: 6.5 g/dL (ref 6.5–8.1)

## 2020-09-03 LAB — CBC
HCT: 42.5 % (ref 36.0–46.0)
Hemoglobin: 13.5 g/dL (ref 12.0–15.0)
MCH: 26.7 pg (ref 26.0–34.0)
MCHC: 31.8 g/dL (ref 30.0–36.0)
MCV: 84 fL (ref 80.0–100.0)
Platelets: 217 10*3/uL (ref 150–400)
RBC: 5.06 MIL/uL (ref 3.87–5.11)
RDW: 14.6 % (ref 11.5–15.5)
WBC: 6.5 10*3/uL (ref 4.0–10.5)
nRBC: 0 % (ref 0.0–0.2)

## 2020-09-03 MED ORDER — RIVAROXABAN 20 MG PO TABS
20.0000 mg | ORAL_TABLET | Freq: Every day | ORAL | Status: DC
  Administered 2020-09-03 – 2020-09-04 (×2): 20 mg via ORAL
  Filled 2020-09-03 (×3): qty 1

## 2020-09-03 MED ORDER — POTASSIUM CHLORIDE CRYS ER 20 MEQ PO TBCR
40.0000 meq | EXTENDED_RELEASE_TABLET | Freq: Once | ORAL | Status: AC
  Administered 2020-09-03: 40 meq via ORAL
  Filled 2020-09-03: qty 2

## 2020-09-03 NOTE — Progress Notes (Signed)
Patient ID: Amy Wolfe, female   DOB: 1975/08/31, 45 y.o.   MRN: [id number] [facility] PROGRESS NOTE  Amy Wolfe [id number] DOB: January 14, 1975 DOA: 09/01/2020 PCP: Hubbard Hartshorn, FNP  HPI/Subjective: Patient was still having some abdominal pain.  This morning had a little nausea and vomiting.  This afternoon the nursing staff called and said she wanted advance her diet to solid food.  Admitted with acute on chronic pancreatitis.  Objective: Vitals:   09/03/20 0550 09/03/20 1535  BP: 121/64 135/76  Pulse:  (!) 57  Resp: 18 17  Temp: 98.1 F (36.7 C) 98.8 F (37.1 C)  SpO2: 99% 100%    Intake/Output Summary (Last 24 hours) at 09/03/2020 1729 Last data filed at 09/03/2020 1359 Gross per 24 hour  Intake 480 ml  Output --  Net 480 ml   Filed Weights   09/01/20 1012  Weight: 95.3 kg    ROS: Review of Systems  Respiratory:  Negative for shortness of breath.   Cardiovascular:  Negative for chest pain.  Gastrointestinal:  Positive for abdominal pain, nausea and vomiting.  Exam: Physical Exam HENT:     Head: Normocephalic.     Mouth/Throat:     Pharynx: No oropharyngeal exudate.  Eyes:     General: Lids are normal.     Conjunctiva/sclera: Conjunctivae normal.  Cardiovascular:     Rate and Rhythm: Normal rate and regular rhythm.     Heart sounds: Normal heart sounds, S1 normal and S2 normal.  Pulmonary:     Breath sounds: Normal breath sounds. No decreased breath sounds, wheezing or rhonchi.  Abdominal:     Palpations: Abdomen is soft.     Tenderness: There is abdominal tenderness.  Musculoskeletal:     Right lower leg: No swelling.     Left lower leg: No swelling.  Skin:    General: Skin is warm.     Findings: No rash.  Neurological:     Mental Status: She is alert and oriented to person, place, and time.      Scheduled Meds:  amLODipine  10 mg Oral QHS   capsaicin   Topical BID   lipase/protease/amylase  72,000 Units Oral TID AC    nicotine  21 mg Transdermal Daily   pantoprazole (PROTONIX) IV  40 mg Intravenous Q24H   rivaroxaban  20 mg Oral Q supper   Continuous Infusions:  sodium chloride 100 mL/hr at 09/03/20 1641    Assessment/Plan:  Acute on chronic pancreatitis.  IV fluid hydration.  Lipase trended into the normal range.  Patient wanted to advance to solid food this evening.  Continue IV and oral pain medications.  Continue pancreatic enzymes. Pancreatic pseudocyst seen on MRI measuring 2.8 x 2.8 cm.  Radiology recommends follow-up imaging in 3 months.  Will likely need referral to gastroenterologist that does endoscopic ultrasounds. Cannabis hyperemesis syndrome.  Continue capsaicin cream topically Hypertensive urgency on presentation.  On Norvasc Stable pulmonary nodule right lower lobe 5 mm dating back to 2020 likely benign etiology History of portal vein thrombosis.  Restart Xarelto Tobacco abuse.  As needed nicotine patch    Code Status:     Code Status Orders  (From admission, onward)           Start     Ordered   09/01/20 1618  Full code  Continuous        09/01/20 1619           Code Status History  Date Active Date Inactive Code Status Order ID Comments User Context   01/14/2020 1930 01/16/2020 1244 Full Code [id number]  Criss Alvine, DO ED   01/09/2020 1643 01/10/2020 1344 Full Code [id number]  Carrie Mew, MD ED   03/30/2019 1701 03/31/2019 1652 Full Code [id number]  Ivor Costa, MD Inpatient   11/04/2018 2020 11/11/2018 1656 Full Code [id number]  Rubie Maid, MD ED   10/28/2018 1435 10/30/2018 1437 Full Code [id number]  Rubie Maid, MD Inpatient   06/13/2018 2012 06/17/2018 2134 Full Code [id number]  Salary, Avel Peace, MD Inpatient      Family Communication: Updated patient's mother on the phone Disposition Plan: Status is: Inpatient   Dispo: The patient is from: Home              Anticipated d/c is to: Home              Patient currently advancing diet today to see if she  tolerates.  Being treated for acute on chronic pancreatitis   Difficult to place patient.  No.  Time spent: 27 minutes  [place]

## 2020-09-03 NOTE — Plan of Care (Signed)

## 2020-09-04 LAB — COMPREHENSIVE METABOLIC PANEL
ALT: 167 U/L — ABNORMAL HIGH (ref 0–44)
AST: 138 U/L — ABNORMAL HIGH (ref 15–41)
Albumin: 4 g/dL (ref 3.5–5.0)
Alkaline Phosphatase: 302 U/L — ABNORMAL HIGH (ref 38–126)
Anion gap: 10 (ref 5–15)
BUN: 6 mg/dL (ref 6–20)
CO2: 27 mmol/L (ref 22–32)
Calcium: 9.1 mg/dL (ref 8.9–10.3)
Chloride: 101 mmol/L (ref 98–111)
Creatinine, Ser: 0.78 mg/dL (ref 0.44–1.00)
GFR, Estimated: >60 mL/min (ref >60)
Glucose, Bld: 116 mg/dL — ABNORMAL HIGH (ref 70–99)
Potassium: 3.7 mmol/L (ref 3.5–5.1)
Sodium: 138 mmol/L (ref 135–145)
Total Bilirubin: 3.3 mg/dL — ABNORMAL HIGH (ref 0.3–1.2)
Total Protein: 7.6 g/dL (ref 6.5–8.1)

## 2020-09-04 LAB — URINE DRUG SCREEN, QUALITATIVE (ARMC ONLY)
Amphetamines, Ur Screen: NOT DETECTED
Barbiturates, Ur Screen: NOT DETECTED
Benzodiazepine, Ur Scrn: POSITIVE — AB
Cannabinoid 50 Ng, Ur ~~LOC~~: POSITIVE — AB
Cocaine Metabolite,Ur ~~LOC~~: NOT DETECTED
MDMA (Ecstasy)Ur Screen: NOT DETECTED
Methadone Scn, Ur: NOT DETECTED
Opiate, Ur Screen: POSITIVE — AB
Phencyclidine (PCP) Ur S: NOT DETECTED
Tricyclic, Ur Screen: NOT DETECTED

## 2020-09-04 LAB — CBC
HCT: 47.8 % — ABNORMAL HIGH (ref 36.0–46.0)
Hemoglobin: 15.8 g/dL — ABNORMAL HIGH (ref 12.0–15.0)
MCH: 27.8 pg (ref 26.0–34.0)
MCHC: 33.1 g/dL (ref 30.0–36.0)
MCV: 84.2 fL (ref 80.0–100.0)
Platelets: 237 10*3/uL (ref 150–400)
RBC: 5.68 MIL/uL — ABNORMAL HIGH (ref 3.87–5.11)
RDW: 14.1 % (ref 11.5–15.5)
WBC: 5.4 10*3/uL (ref 4.0–10.5)
nRBC: 0 % (ref 0.0–0.2)

## 2020-09-04 LAB — LIPASE, BLOOD: Lipase: 30 U/L (ref 11–51)

## 2020-09-04 MED ORDER — FENTANYL CITRATE PF 50 MCG/ML IJ SOSY
25.0000 ug | PREFILLED_SYRINGE | Freq: Once | INTRAMUSCULAR | Status: AC
  Administered 2020-09-04: 25 ug via INTRAVENOUS
  Filled 2020-09-04: qty 1

## 2020-09-04 MED ORDER — SODIUM CHLORIDE 0.9 % IV SOLN: INTRAVENOUS | Status: DC

## 2020-09-04 MED ORDER — CHLORDIAZEPOXIDE HCL 25 MG PO CAPS
25.0000 mg | ORAL_CAPSULE | Freq: Three times a day (TID) | ORAL | Status: DC
  Administered 2020-09-04 (×3): 25 mg via ORAL
  Filled 2020-09-04 (×3): qty 1

## 2020-09-04 MED ORDER — POTASSIUM CHLORIDE 10 MEQ/100ML IV SOLN: 10.0000 meq | INTRAVENOUS | Status: DC

## 2020-09-04 MED ORDER — HYDROMORPHONE HCL 1 MG/ML IJ SOLN: 0.5000 mg | Freq: Once | INTRAMUSCULAR | Status: DC

## 2020-09-04 MED ORDER — OXYCODONE HCL 5 MG PO TABS
10.0000 mg | ORAL_TABLET | ORAL | Status: DC | PRN
Start: 2020-09-04 — End: 2020-09-05
  Administered 2020-09-04 – 2020-09-05 (×2): 10 mg via ORAL
  Filled 2020-09-04 (×3): qty 2

## 2020-09-04 MED ORDER — DIAZEPAM 5 MG/ML IJ SOLN
2.5000 mg | INTRAMUSCULAR | Status: AC
  Administered 2020-09-04: 2.5 mg via INTRAVENOUS
  Filled 2020-09-04: qty 2

## 2020-09-04 NOTE — Progress Notes (Signed)
Patient notified that her room was going to be changed.  Patient became upset and states " I just want to go home."  Patient informed MD has not released her and she would be leaving AMA.  Patient states "That's fine. I want to go home."  Charge nurse in room with this nurse.  Took AMA form back to room approx 5 min later and the patient was not in room or bathroom.  Camera operator, Librarian, academic, and security notified.

## 2020-09-04 NOTE — Progress Notes (Addendum)
Patient moved to new room.  Within five minutes she walked off the unit to smoke. Security and Therapist, art notified. MD notified.

## 2020-09-04 NOTE — Progress Notes (Signed)
OrderedPatient ID: Amy Wolfe, female   DOB: 02-18-1975, 45 y.o.   MRN: [id number] [facility] PROGRESS NOTE  Amy Wolfe [id number] DOB: [birth year] DOA: 09/01/2020 PCP: Hubbard Hartshorn, FNP  HPI/Subjective: Patient was vomiting this morning and did not feel well.  Still requiring IV pain medications.  Patient having a lot of pain this morning.  Patient was going to sign out [place] but I think I convinced her to stay.  Objective: Vitals:   09/04/20 0623 09/04/20 1028  BP: (!) 126/93 (!) 147/116  Pulse: 80 69  Resp:    Temp:  98.6 F (37 C)  SpO2:  100%     Filed Weights   09/01/20 1012  Weight: 95.3 kg    ROS: Review of Systems  Respiratory:  Negative for shortness of breath.   Cardiovascular:  Negative for chest pain.  Gastrointestinal:  Positive for abdominal pain, nausea and vomiting.  Exam: Physical Exam HENT:     Head: Normocephalic.     Mouth/Throat:     Pharynx: No oropharyngeal exudate.  Eyes:     General: Lids are normal.     Conjunctiva/sclera: Conjunctivae normal.  Cardiovascular:     Rate and Rhythm: Normal rate and regular rhythm.     Heart sounds: Normal heart sounds, S1 normal and S2 normal.  Pulmonary:     Breath sounds: No decreased breath sounds, wheezing, rhonchi or rales.  Abdominal:     Palpations: Abdomen is soft.     Tenderness: There is generalized abdominal tenderness.  Musculoskeletal:     Right ankle: No swelling.     Left ankle: No swelling.  Skin:    General: Skin is warm.     Findings: No rash.  Neurological:     Mental Status: She is alert and oriented to person, place, and time.      Scheduled Meds:  amLODipine  10 mg Oral QHS   capsaicin   Topical BID   chlordiazePOXIDE  25 mg Oral TID   lipase/protease/amylase  72,000 Units Oral TID AC   nicotine  21 mg Transdermal Daily   pantoprazole (PROTONIX) IV  40 mg Intravenous Q24H   rivaroxaban  20 mg Oral Q supper   Continuous  Infusions:  sodium chloride 100 mL/hr at 09/04/20 0158   potassium chloride      Assessment/Plan:  Acute on chronic pancreatitis.  Continue IV fluid hydration.  Repeat chemistry pending today.  Patient in a lot of pain today.  Continue IV and p.o. pain medications.  Downgraded diet back to clear liquid diet after n.p.o. this morning. Pancreatic pseudocyst on MRI measuring 2.8 x 2.8 cm.  Radiology recommends imaging in 3 months.  Likely will need referral to gastroenterologist at this endoscopic ultrasound as outpatient. Cannabis hyperemesis syndrome.  Continue capsaicin cream topically.  Patient has the need to take hot showers which sometimes is a sign of cannabis hyperemesis syndrome.  Patient does not believe me that the marijuana could cause this. Hypertensive urgency on presentation on Norvasc History of portal vein thrombosis on Xarelto Tobacco abuse.  Patient going outside to smoke.  We will send a urine toxicology Stable pulmonary nodule dating back to 2020. Not sure if she is withdrawing from alcohol or not,I did put on Librium taper.      Code Status:     Code Status Orders  (From admission, onward)           Start     Ordered  09/01/20 1618  Full code  Continuous        09/01/20 1619           Code Status History     Date Active Date Inactive Code Status Order ID Comments User Context   01/14/2020 1930 01/16/2020 1244 Full Code [id number]  Criss Alvine, DO ED   01/09/2020 1643 01/10/2020 1344 Full Code [id number]  Carrie Mew, MD ED   03/30/2019 1701 03/31/2019 1652 Full Code [id number]  Ivor Costa, MD Inpatient   11/04/2018 2020 11/11/2018 1656 Full Code [id number]  Rubie Maid, MD ED   10/28/2018 1435 10/30/2018 1437 Full Code [id number]  Rubie Maid, MD Inpatient   06/13/2018 2012 06/17/2018 2134 Full Code [id number]  Salary, Avel Peace, MD Inpatient      Family Communication: Spoke with patient's mother on the phone Disposition Plan: Status is:  Inpatient  Dispo: The patient is from: Home              Anticipated d/c is to: Home              Patient currently with worsening abdominal pain nausea vomiting.  I think this is cannabis hyperemesis syndrome on top of acute on chronic pancreatitis   Difficult to place patient.  No.  Time spent: 32 minutes  [place]

## 2020-09-04 NOTE — Progress Notes (Signed)
Patient unhooked her IV. Walked in hallway. When this nurse asked where she was going patient replied "outside".  Reiterated patient that she can't leave the floor.  Patient kept walking.  Security, Therapist, art, and MD notified.  MD notified that patient is unhooking IV and refused scheduled meds.

## 2020-09-04 NOTE — Progress Notes (Signed)
Patient ID: Amy Wolfe, female   DOB: 02-May-1975, 45 y.o.   MRN: [id number]  Spoke with patient again about not leaving the floor.  I told her that she has a nicotine patch ordered if she wants.  The patient told me that she will stay on the floor.    Patient is feeling better and wants to try solid food again.  Will transition to oral pain medications.  Dr Leslye Peer

## 2020-09-04 NOTE — Plan of Care (Signed)

## 2020-09-05 MED ORDER — PANCRELIPASE (LIP-PROT-AMYL) 36000-114000 UNITS PO CPEP: 36000.0000 [IU] | ORAL_CAPSULE | ORAL | 1 refills | Status: DC

## 2020-09-05 MED ORDER — AMLODIPINE BESYLATE 10 MG PO TABS: 10.0000 mg | ORAL_TABLET | Freq: Every day | ORAL | 1 refills | Status: DC

## 2020-09-05 MED ORDER — RIVAROXABAN 20 MG PO TABS: 20.0000 mg | ORAL_TABLET | Freq: Every day | ORAL | 1 refills | Status: DC

## 2020-09-05 MED ORDER — ONDANSETRON HCL 4 MG PO TABS: 4.0000 mg | ORAL_TABLET | Freq: Three times a day (TID) | ORAL | 0 refills | Status: DC | PRN

## 2020-09-05 MED ORDER — PANTOPRAZOLE SODIUM 40 MG PO TBEC: 40.0000 mg | DELAYED_RELEASE_TABLET | Freq: Every day | ORAL | 1 refills | Status: DC

## 2020-09-05 MED ORDER — OXYCODONE HCL 5 MG PO TABS: 5.0000 mg | ORAL_TABLET | Freq: Four times a day (QID) | ORAL | 0 refills | Status: AC | PRN

## 2020-09-05 NOTE — Progress Notes (Signed)
Discharge paperwork/teaching done.  Patient verbalized understanding. No unanswered questions.  IV removed by patient. Tip intact. Gauze applied with tape.  All belongings with patient. Patient waiting on ride.

## 2020-09-05 NOTE — Plan of Care (Signed)

## 2020-09-05 NOTE — Progress Notes (Addendum)
Patient refused midnight vital signs and continuous fluids. Patient advised that updated VS are needed prior to giving oxycodone. Patient verbalized understanding.

## 2020-09-05 NOTE — Plan of Care (Signed)
  Problem: Education: Goal: Knowledge of General Education information will improve Description: Including pain rating scale, medication(s)/side effects and non-pharmacologic comfort measures 09/05/2020 0955 by Elsie Ra, RN Outcome: Completed/Met 09/05/2020 0955 by Elsie Ra, RN Outcome: Progressing   Problem: Health Behavior/Discharge Planning: Goal: Ability to manage health-related needs will improve 09/05/2020 0955 by Elsie Ra, RN Outcome: Completed/Met 09/05/2020 0955 by Elsie Ra, RN Outcome: Progressing   Problem: Clinical Measurements: Goal: Ability to maintain clinical measurements within normal limits will improve 09/05/2020 0955 by Elsie Ra, RN Outcome: Completed/Met 09/05/2020 0955 by Elsie Ra, RN Outcome: Progressing Goal: Will remain free from infection 09/05/2020 0955 by Elsie Ra, RN Outcome: Completed/Met 09/05/2020 0955 by Elsie Ra, RN Outcome: Progressing Goal: Diagnostic test results will improve 09/05/2020 0955 by Elsie Ra, RN Outcome: Completed/Met 09/05/2020 0955 by Elsie Ra, RN Outcome: Progressing Goal: Respiratory complications will improve 09/05/2020 0955 by Elsie Ra, RN Outcome: Completed/Met 09/05/2020 0955 by Elsie Ra, RN Outcome: Progressing Goal: Cardiovascular complication will be avoided 09/05/2020 0955 by Elsie Ra, RN Outcome: Completed/Met 09/05/2020 0955 by Elsie Ra, RN Outcome: Progressing   Problem: Activity: Goal: Risk for activity intolerance will decrease 09/05/2020 0955 by Elsie Ra, RN Outcome: Completed/Met 09/05/2020 0955 by Elsie Ra, RN Outcome: Progressing   Problem: Nutrition: Goal: Adequate nutrition will be maintained 09/05/2020 0955 by Elsie Ra, RN Outcome: Completed/Met 09/05/2020 0955 by Elsie Ra, RN Outcome: Progressing   Problem: Coping: Goal: Level of anxiety will  decrease 09/05/2020 0955 by Elsie Ra, RN Outcome: Completed/Met 09/05/2020 0955 by Elsie Ra, RN Outcome: Progressing   Problem: Elimination: Goal: Will not experience complications related to bowel motility 09/05/2020 0955 by Elsie Ra, RN Outcome: Completed/Met 09/05/2020 0955 by Elsie Ra, RN Outcome: Progressing Goal: Will not experience complications related to urinary retention 09/05/2020 0955 by Elsie Ra, RN Outcome: Completed/Met 09/05/2020 0955 by Elsie Ra, RN Outcome: Progressing   Problem: Pain Managment: Goal: General experience of comfort will improve 09/05/2020 0955 by Elsie Ra, RN Outcome: Completed/Met 09/05/2020 0955 by Elsie Ra, RN Outcome: Progressing   Problem: Safety: Goal: Ability to remain free from injury will improve 09/05/2020 0955 by Elsie Ra, RN Outcome: Completed/Met 09/05/2020 0955 by Elsie Ra, RN Outcome: Progressing   Problem: Skin Integrity: Goal: Risk for impaired skin integrity will decrease 09/05/2020 0955 by Elsie Ra, RN Outcome: Completed/Met 09/05/2020 0955 by Elsie Ra, RN Outcome: Progressing   Problem: Education: Goal: Knowledge of Pancreatitis treatment and prevention will improve 09/05/2020 0955 by Elsie Ra, RN Outcome: Completed/Met 09/05/2020 0955 by Elsie Ra, RN Outcome: Progressing   Problem: Health Behavior/Discharge Planning: Goal: Ability to formulate a plan to maintain an alcohol-free life will improve 09/05/2020 0955 by Elsie Ra, RN Outcome: Completed/Met 09/05/2020 0955 by Elsie Ra, RN Outcome: Progressing   Problem: Nutritional: Goal: Ability to achieve adequate nutritional intake will improve 09/05/2020 0955 by Elsie Ra, RN Outcome: Completed/Met 09/05/2020 0955 by Elsie Ra, RN Outcome: Progressing   Problem: Clinical Measurements: Goal: Complications related to  the disease process, condition or treatment will be avoided or minimized 09/05/2020 0955 by Elsie Ra, RN Outcome: Completed/Met 09/05/2020 0955 by Elsie Ra, RN Outcome: Progressing

## 2020-09-05 NOTE — Discharge Summary (Signed)
[place] [facility] [place] NAME: Amy Wolfe    MR#:  [id number]  DATE OF BIRTH:  [birth year]  DATE OF ADMISSION:  09/01/2020 ADMITTING PHYSICIAN: Collier Bullock, MD  DATE OF DISCHARGE: 09/05/2020 10:00 AM  PRIMARY CARE PHYSICIAN: Hubbard Hartshorn, FNP    ADMISSION DIAGNOSIS:  Pancreatic mass [K86.89] Acute on chronic pancreatitis ([place]) [K85.90, K86.1] Acute pancreatitis, unspecified complication status, unspecified pancreatitis type [K85.90]  DISCHARGE DIAGNOSIS:  Principal Problem:   Acute on chronic pancreatitis ([place]) Active Problems:   GERD (gastroesophageal reflux disease)   Tobacco abuse   Cannabis hyperemesis syndrome concurrent with and due to cannabis dependence ([place])   Nicotine dependence   Non compliance w medication regimen   Hypertensive urgency   Pancreatic pseudocyst   Pulmonary nodule   SECONDARY DIAGNOSIS:   Past Medical History:  Diagnosis Date  . Abdominal pain   . Anxiety   . Coronary artery disease   . Dyspnea   . GERD (gastroesophageal reflux disease)   . H/O blood clots   . Hypertension   . Liver abscess   . Pancreatitis   . PONV (postoperative nausea and vomiting) 11/07/2018  . Thyroid disease   . Uterine fibroid     HOSPITAL COURSE:   1.  Acute on chronic pancreatitis.  The patient admitted with abdominal pain nausea vomiting.  The patient was initially started on clear liquid diet advanced slowly.  On the day of discharge she was tolerating solid food and was not having much pain at all.  A few pills of pain medication prescribed upon disposition.  Pancreatic enzymes renewed. 2.  Pancreatic pseudocyst on MRI measuring 2.8 x 2.8 cm.  Radiology recommends reimaging in about 3 months.  I did give the name of a gastroenterologist in [facility] for endoscopic ultrasound but will likely need an outpatient referral. 3.  Cannabis hyperemesis syndrome.  The patient did not want the capsaicin cream because she  states it burns.  I told her the reason that she feels better with a hot showers because of cannabis hyperemesis syndrome.  I told her if she stops the cannabis this will get better.  The patient did not believe me that marijuana could cause the symptoms.  Information about cannabis hyperemesis syndrome upon discharge. 4.  Hypertensive urgency on Norvasc which I renewed. 5.  History of portal vein thrombosis on Xarelto which I renewed. 6.  Tobacco abuse.  Patient declined nicotine patch.  Patient went outside to smoke numerous times and I told her that she needs to stay in the room.  After going outside urine toxicology positive for benzodiazepine cannabis and opiates.  We did give benzodiazepine and pain medication here in the hospital. 7.  Stable pulmonary nodule dating back to 2025 mm.  DISCHARGE CONDITIONS:   Satisfactory  CONSULTS OBTAINED:  None  DRUG ALLERGIES:  No Known Allergies  DISCHARGE MEDICATIONS:   Allergies as of 09/05/2020   No Known Allergies      Medication List     STOP taking these medications    dicyclomine 10 MG capsule Commonly known as: Bentyl   docusate sodium 100 MG capsule Commonly known as: COLACE   oxyCODONE-acetaminophen 5-325 MG tablet Commonly known as: Percocet   promethazine 12.5 MG tablet Commonly known as: PHENERGAN   Slynd 4 MG Tabs Generic drug: Drospirenone       TAKE these medications    acetaminophen 650 MG CR tablet Commonly known as: Tylenol 8 Hour Take 1  tablet (650 mg total) by mouth every 8 (eight) hours as needed for pain.   amLODipine 10 MG tablet Commonly known as: NORVASC Take 1 tablet (10 mg total) by mouth at bedtime.   lipase/protease/amylase 36000 UNITS Cpep capsule Commonly known as: Creon Take 1-2 capsules (36,000-72,000 Units total) by mouth See admin instructions. Take 2 capsules (72000u) by mouth three times daily with meals and take 1 capsule (36000u) by mouth twice daily with snacks What changed:   medication strength See the new instructions.   ondansetron 4 MG tablet Commonly known as: ZOFRAN Take 1 tablet (4 mg total) by mouth every 8 (eight) hours as needed for nausea or vomiting.   oxyCODONE 5 MG immediate release tablet Commonly known as: Oxy IR/ROXICODONE Take 1 tablet (5 mg total) by mouth every 6 (six) hours as needed for up to 3 days for moderate pain or severe pain.   pantoprazole 40 MG tablet Commonly known as: PROTONIX Take 1 tablet (40 mg total) by mouth daily.   rivaroxaban 20 MG Tabs tablet Commonly known as: Xarelto Take 1 tablet (20 mg total) by mouth daily with supper.         DISCHARGE INSTRUCTIONS:   Follow-up PMD.  She needs a doctor that is accepting new Medicaid patients on her Medicaid card. Follow-up gastroenterology as outpatient for possible endoscopic ultrasound  If you experience worsening of your admission symptoms, develop shortness of breath, life threatening emergency, suicidal or homicidal thoughts you must seek medical attention immediately by calling 911 or calling your MD immediately  if symptoms less severe.  You Must read complete instructions/literature along with all the possible adverse reactions/side effects for all the Medicines you take and that have been prescribed to you. Take any new Medicines after you have completely understood and accept all the possible adverse reactions/side effects.   Please note  You were cared for by a hospitalist during your hospital stay. If you have any questions about your discharge medications or the care you received while you were in the hospital after you are discharged, you can call the unit and asked to speak with the hospitalist on call if the hospitalist that took care of you is not available. Once you are discharged, your primary care physician will handle any further medical issues. Please note that NO REFILLS for any discharge medications will be authorized once you are discharged, as  it is imperative that you return to your primary care physician (or establish a relationship with a primary care physician if you do not have one) for your aftercare needs so that they can reassess your need for medications and monitor your lab values.    Today   CHIEF COMPLAINT:   Chief Complaint  Patient presents with  . Nausea  . Emesis    HISTORY OF PRESENT ILLNESS:  Amy Wolfe  is a 45 y.o. female came in with nausea vomiting and abdominal pain and found to have acute on chronic pancreatitis and cannabis hyperemesis syndrome   VITAL SIGNS:  Blood pressure (!) 122/98, pulse 87, temperature 98.4 F (36.9 C), resp. rate 18, height '5\' 7"'$  (1.702 m), weight 95.3 kg, last menstrual period 09/30/2018, SpO2 100 %.  I/O:   Intake/Output Summary (Last 24 hours) at 09/05/2020 1648 Last data filed at 09/05/2020 0546 Gross per 24 hour  Intake 311.45 ml  Output --  Net 311.45 ml    PHYSICAL EXAMINATION:  GENERAL:  45 y.o.-year-old patient lying in the bed with no acute distress.  EYES: Pupils equal, round, reactive to light and accommodation. No scleral icterus. HEENT: Head atraumatic, normocephalic. Oropharynx and nasopharynx clear.  LUNGS: Normal breath sounds bilaterally, no wheezing, rales,rhonchi or crepitation. No use of accessory muscles of respiration.  CARDIOVASCULAR: S1, S2 normal. No murmurs, rubs, or gallops.  ABDOMEN: Soft, non-tender, non-distended. Bowel sounds present. EXTREMITIES: No pedal edema.  NEUROLOGIC: Cranial nerves II through XII are intact. Muscle strength 5/5 in all extremities. Sensation intact. Gait not checked.  PSYCHIATRIC: The patient is alert and oriented x 3.  SKIN: No obvious rash, lesion, or ulcer.   DATA REVIEW:   CBC Recent Labs  Lab 09/04/20 1415  WBC 5.4  HGB 15.8*  HCT 47.8*  PLT 237    Chemistries  Recent Labs  Lab 09/04/20 1415  NA 138  K 3.7  CL 101  CO2 27  GLUCOSE 116*  BUN 6  CREATININE 0.78  CALCIUM 9.1  AST  138*  ALT 167*  ALKPHOS 302*  BILITOT 3.3*     Microbiology Results  Results for orders placed or performed during the hospital encounter of 09/01/20  Resp Panel by RT-PCR (Flu A&B, Covid) Nasopharyngeal Swab     Status: None   Collection Time: 09/01/20  6:50 PM   Specimen: Nasopharyngeal Swab; Nasopharyngeal(NP) swabs in vial transport medium  Result Value Ref Range Status   SARS Coronavirus 2 by RT PCR NEGATIVE NEGATIVE Final    Comment: (NOTE) SARS-CoV-2 target nucleic acids are NOT DETECTED.  The SARS-CoV-2 RNA is generally detectable in upper respiratory specimens during the acute phase of infection. The lowest concentration of SARS-CoV-2 viral copies this assay can detect is 138 copies/mL. A negative result does not preclude SARS-Cov-2 infection and should not be used as the sole basis for treatment or other patient management decisions. A negative result may occur with  improper specimen collection/handling, submission of specimen other than nasopharyngeal swab, presence of viral mutation(s) within the areas targeted by this assay, and inadequate number of viral copies(<138 copies/mL). A negative result must be combined with clinical observations, patient history, and epidemiological information. The expected result is Negative.  Fact Sheet for Patients:  EntrepreneurPulse.com.au  Fact Sheet for Healthcare Providers:  IncredibleEmployment.be  This test is no t yet approved or cleared by the [place] FDA and  has been authorized for detection and/or diagnosis of SARS-CoV-2 by FDA under an Emergency Use Authorization (EUA). This EUA will remain  in effect (meaning this test can be used) for the duration of the COVID-19 declaration under Section 564(b)(1) of the Act, 21 U.S.C.section 360bbb-3(b)(1), unless the authorization is terminated  or revoked sooner.       Influenza A by PCR NEGATIVE NEGATIVE Final   Influenza B by PCR  NEGATIVE NEGATIVE Final    Comment: (NOTE) The Xpert Xpress SARS-CoV-2/FLU/RSV plus assay is intended as an aid in the diagnosis of influenza from Nasopharyngeal swab specimens and should not be used as a sole basis for treatment. Nasal washings and aspirates are unacceptable for Xpert Xpress SARS-CoV-2/FLU/RSV testing.  Fact Sheet for Patients: EntrepreneurPulse.com.au  Fact Sheet for Healthcare Providers: IncredibleEmployment.be  This test is not yet approved or cleared by the [place] FDA and has been authorized for detection and/or diagnosis of SARS-CoV-2 by FDA under an Emergency Use Authorization (EUA). This EUA will remain in effect (meaning this test can be used) for the duration of the COVID-19 declaration under Section 564(b)(1) of the Act, 21 U.S.C. section 360bbb-3(b)(1), unless the authorization is terminated or  revoked.  Performed at [facility], [address], [place], [place] [address]      Management plans discussed with the patient, and she is in agreement.  Patient declined me calling her mother today.  CODE STATUS:  Code Status History     Date Active Date Inactive Code Status Order ID Comments User Context   09/01/2020 1619 09/05/2020 1505 Full Code [id number]  Collier Bullock, MD ED   01/14/2020 1930 01/16/2020 1244 Full Code [id number]  Cox, Amy Delane Ginger, DO ED   01/09/2020 1643 01/10/2020 1344 Full Code [id number]  Carrie Mew, MD ED   03/30/2019 1701 03/31/2019 1652 Full Code [id number]  Ivor Costa, MD Inpatient   11/04/2018 2020 11/11/2018 1656 Full Code [id number]  Rubie Maid, MD ED   10/28/2018 1435 10/30/2018 1437 Full Code [id number]  Rubie Maid, MD Inpatient   06/13/2018 2012 06/17/2018 2134 Full Code [id number]  Salary, Avel Peace, MD Inpatient      Questions for Most Recent Historical Code Status (Order [id number])        TOTAL TIME TAKING CARE OF THIS PATIENT: 32 minutes.    Loletha Grayer M.D on  09/05/2020 at 4:48 PM    [facility]  CC: Primary care physician; Hubbard Hartshorn, FNP

## 2020-09-07 ENCOUNTER — Encounter: Payer: Self-pay | Admitting: Nurse Practitioner

## 2020-10-01 IMAGING — MR MRI ABDOMEN WITH AND WITHOUT CONTRAST
18 of 19 series · 45 of 48 positions shown · IV contrast (Gadavist)
Comparison: CT 05/15/2018

CLINICAL DATA: Chronic pancreatitis follow-up.

EXAM:
MRI ABDOMEN WITHOUT AND WITH CONTRAST
TECHNIQUE: Multiplanar multisequence MR imaging of the abdomen was performed
both before and after the administration of intravenous contrast.
CONTRAST:  CT 05/15/2018

[Series 2: cor haste · coronal · 6.0mm · 1.19mm/px · 2 of 32 slices shown]
[im 1/32]
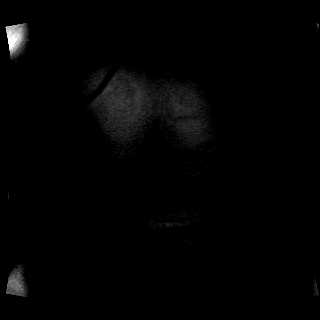
[im 32/32]
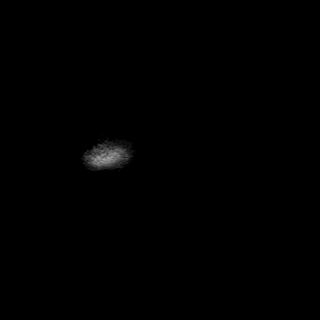

[Series 3: ax haste · axial · 6.0mm · 1.19mm/px · z∈[-97,+127]mm · 2 of 32 slices shown]
[im 1/32]
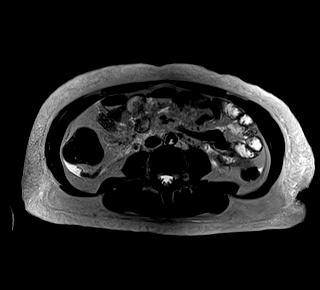
[im 32/32]
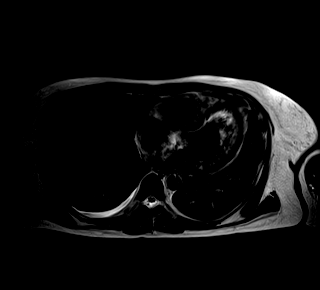

[Series 6: T2 fat-sat · axial · 6.0mm · 1.19mm/px · 1 of 30 slices shown]
[im 1/30]
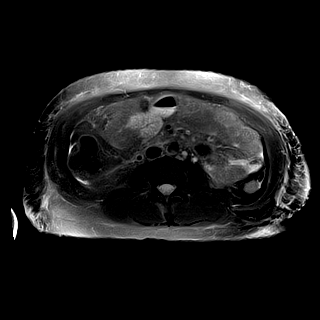

[Series 7: ax dwi_tracew · axial · 6.0mm · 1.42mm/px · z∈[-83,+125]mm · 4 of 90 slices shown]
[im 1/90]
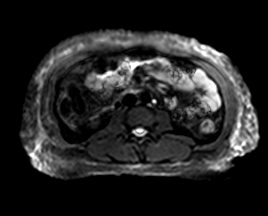
[im 30/90]
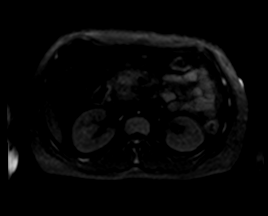
[im 60/90]
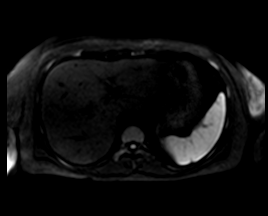
[im 90/90]
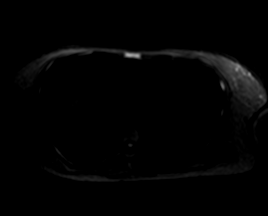

[Series 8: ax dwi_adc · axial · 6.0mm · 1.42mm/px · 1 of 30 slices shown]
[im 1/30]
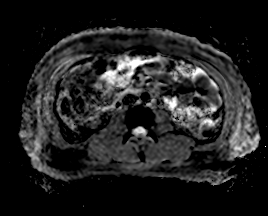

[Series 13: T1 · axial · 6.0mm · 0.74mm/px · z∈[-87,+136]mm · 3 of 64 slices shown]
[im 1/64]
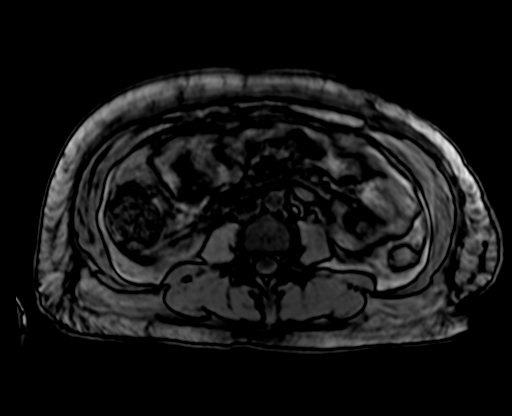
[im 32/64]
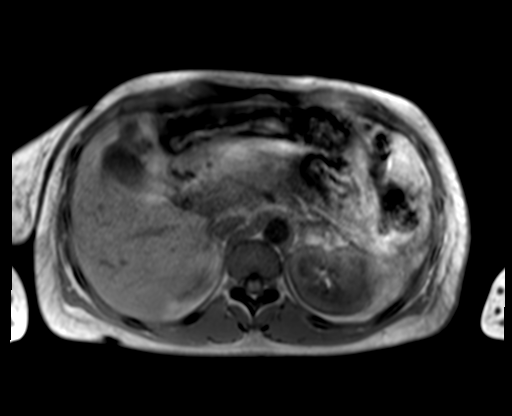
[im 64/64]
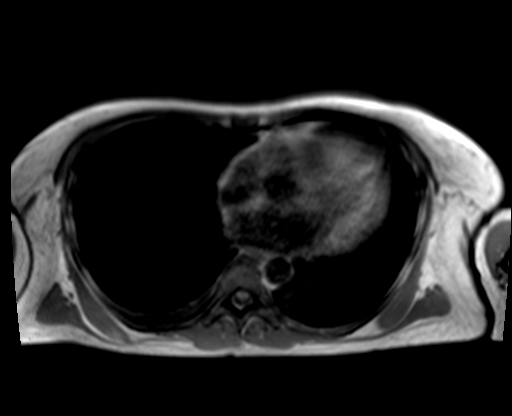

[Series 14: bSSFP · axial · 6.0mm · 0.74mm/px · 1 of 32 slices shown]
[im 1/32]
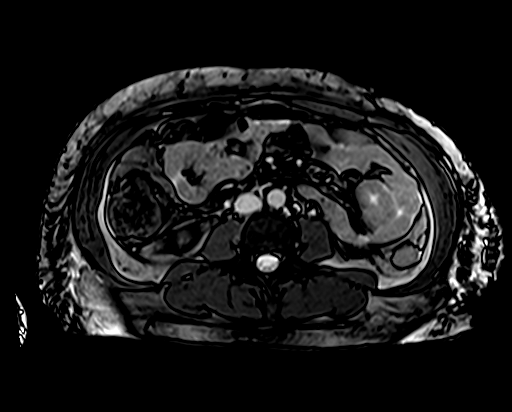

[Series 15: T1 dynamic fat-sat · axial · non-contrast · 3.0mm · 1.19mm/px · z∈[-99,+138]mm · 3 of 80 slices shown (1 of 5)]
[im 1/80]
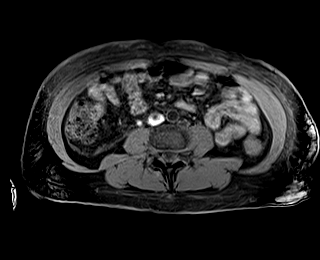
[im 40/80]
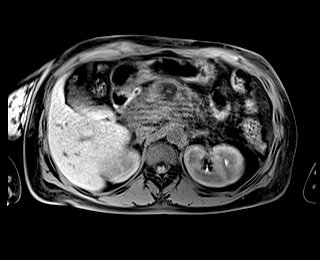
[im 80/80]
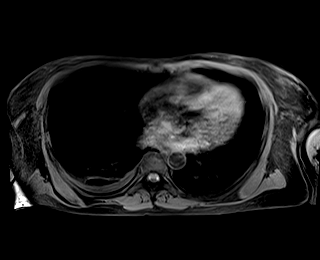

[Series 16: T1 dynamic fat-sat post-contrast · axial · 3.0mm · 1.19mm/px · z∈[-99,+138]mm · 3 of 80 slices shown (1 of 4)]
[im 1/80]
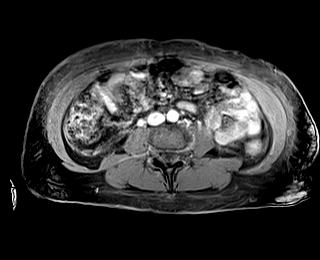
[im 40/80]
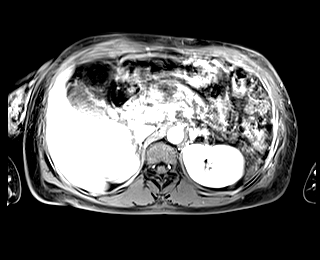
[im 80/80]
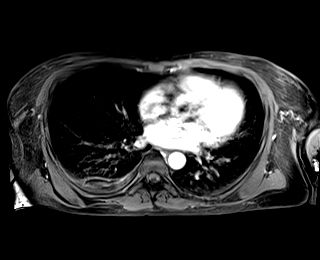

[Series 17: T1 dynamic fat-sat · axial · 3.0mm · 1.19mm/px · z∈[-99,+138]mm · 3 of 80 slices shown (2 of 5)]
[im 1/80]
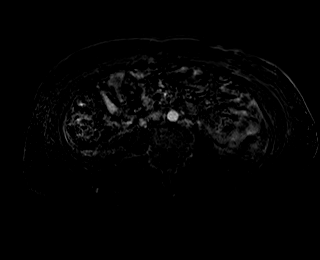
[im 40/80]
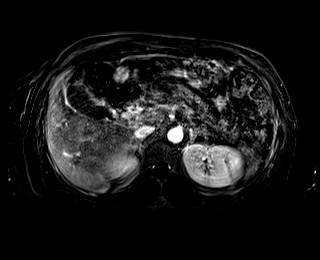
[im 80/80]
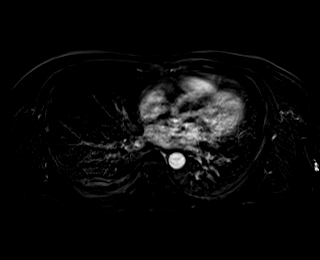

[Series 18: T1 dynamic fat-sat post-contrast · axial · 3.0mm · 1.19mm/px · z∈[-99,+138]mm · 3 of 80 slices shown (2 of 4)]
[im 1/80]
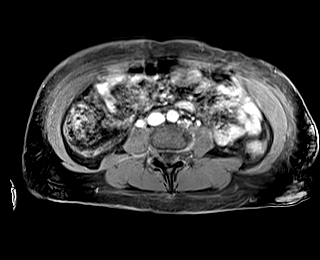
[im 40/80]
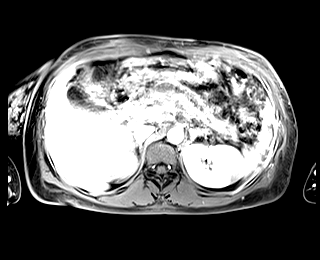
[im 80/80]
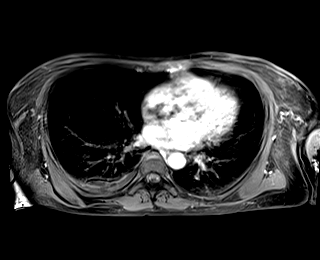

[Series 19: T1 dynamic fat-sat · axial · 3.0mm · 1.19mm/px · z∈[-99,+138]mm · 3 of 80 slices shown (3 of 5)]
[im 1/80]
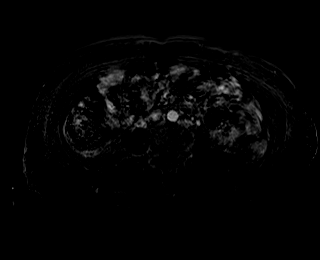
[im 40/80]
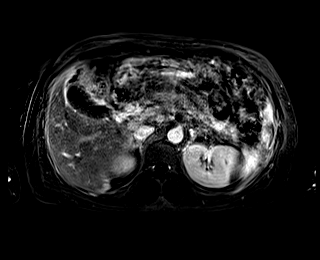
[im 80/80]
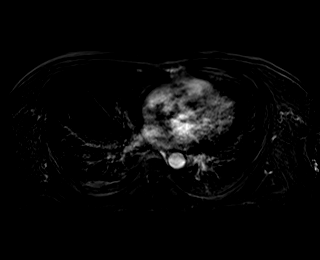

[Series 20: T1 dynamic fat-sat post-contrast · axial · 3.0mm · 1.19mm/px · z∈[-99,+138]mm · 3 of 80 slices shown (3 of 4)]
[im 1/80]
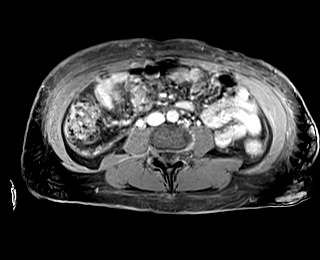
[im 40/80]
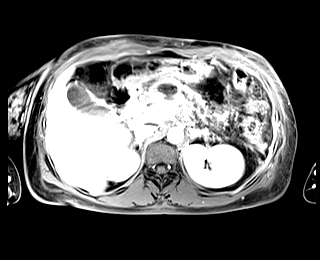
[im 80/80]
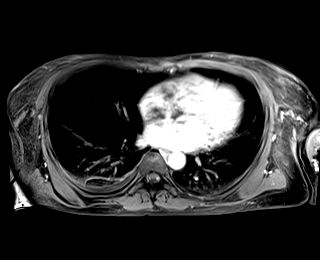

[Series 21: T1 dynamic fat-sat · axial · 3.0mm · 1.19mm/px · z∈[-99,+138]mm · 3 of 80 slices shown (4 of 5)]
[im 1/80]
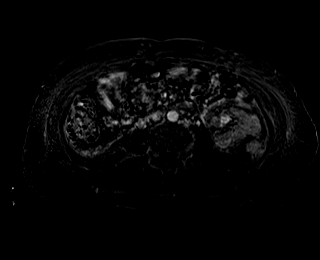
[im 40/80]
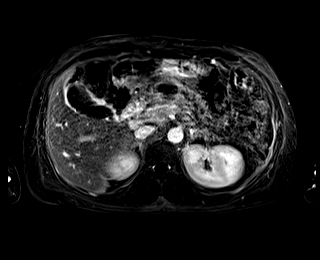
[im 80/80]
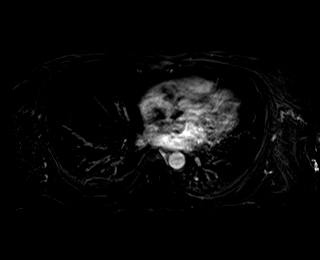

[Series 22: T1 dynamic post-contrast · coronal · 3.0mm · 1.31mm/px · 3 of 80 slices shown]
[im 1/80]
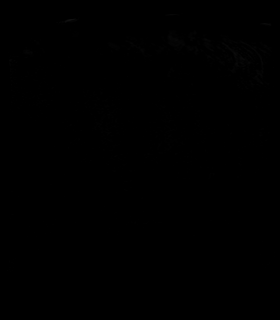
[im 40/80]
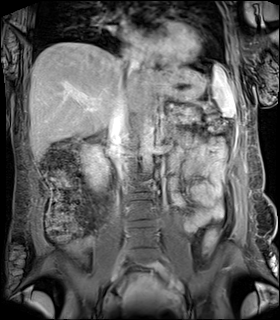
[im 80/80]
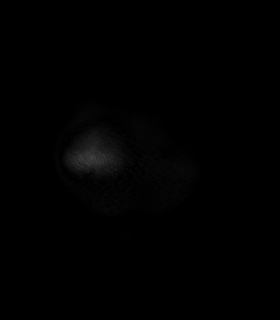

[Series 23: T1 dynamic fat-sat post-contrast · axial · 3.0mm · 1.19mm/px · z∈[-99,+138]mm · 3 of 80 slices shown (4 of 4)]
[im 1/80]
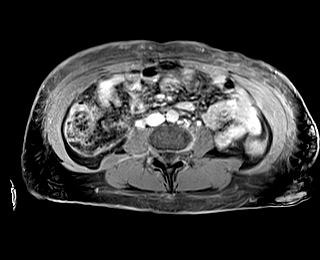
[im 40/80]
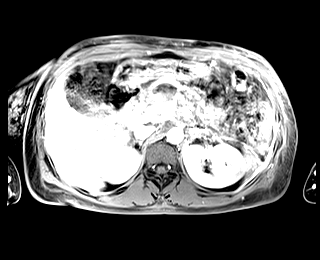
[im 80/80]
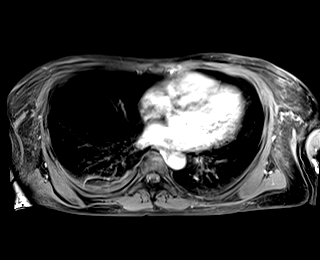

[Series 24: T1 dynamic fat-sat · axial · 3.0mm · 1.19mm/px · z∈[-99,+138]mm · 3 of 80 slices shown (5 of 5)]
[im 1/80]
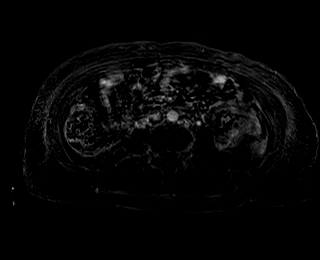
[im 40/80]
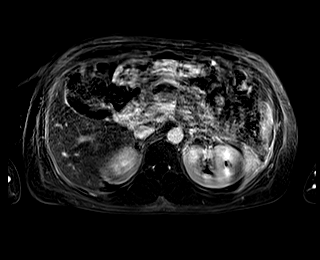
[im 80/80]
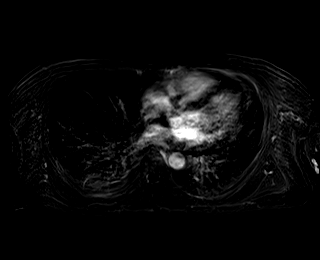

[Series 1018: MRCP · coronal · 0.6mm · 0.43mm/px · 1 of 16 slices shown]
[im 1/16]
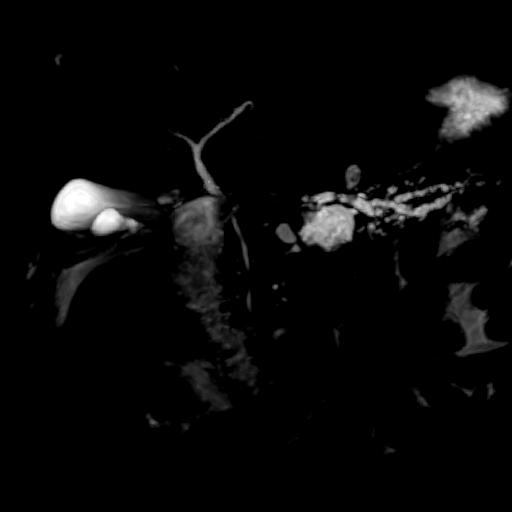

[45 of 48 positions shown; findings below may reference images not displayed]

FINDINGS: Lower chest:  Small RIGHT effusion.

Hepatobiliary: No intrahepatic biliary duct dilatation. No
gallstones. The common bile duct is normal caliber.

The liver parenchyma is normal without evidence of abscess. No
hepatic steatosis.

Pancreas: Head of the pancreas remains edematous. There is a cystic
collection in the mid pancreas towards the pancreatic neck measuring
2.7 by 2.4 cm (image [DATE]). This collection is similar to 2.6 x
cm on comparison exam. There is internal debris within this cystic
collection without enhancement. Proximal to this cystic collection
the pancreatic duct is dilated and ectatic (image [DATE]).

No enhancing lesions within the pancreas.No additional pseudocyst
formation outside of the pancreas.

Spleen: Normal spleen

Adrenals/urinary tract: Adrenal glands and kidneys are normal.

Stomach/Bowel: Stomach and limited of the small bowel is
unremarkable

Vascular/Lymphatic: Abdominal aortic normal caliber. No
retroperitoneal periportal lymphadenopathy.

Persistent partial thrombosis of the portal veins. Venous
vasculature is somewhat difficult evaluate due to motion
degradation. Vascular occlusions better depicted on comparison CT.

Musculoskeletal: No aggressive osseous lesion
IMPRESSION: 1. Cystic lesion in the mid pancreas with internal debris is most
consistent with postinflammatory cyst. The cysts either compresses
or communicates with the pancreatic duct as the duct is dilated
upstream of this cystic complex. Cystic complex not significant
changed from CT 05/15/2018.
[DATE]. Head of the pancreas remains edematous consistent with chronic
pancreatitis. No additional organized fluid collections identified.
3. No cholelithiasis. Common bile duct normal caliber. No
intrahepatic duct dilatation. No enhancing hepatic lesion. No
hepatic steatosis.
4. Persistent partial thrombosis of the portal veins.

## 2020-10-05 ENCOUNTER — Encounter: Payer: Self-pay | Admitting: Nurse Practitioner

## 2020-10-05 ENCOUNTER — Ambulatory Visit (INDEPENDENT_AMBULATORY_CARE_PROVIDER_SITE_OTHER): Payer: Medicaid Other | Admitting: Nurse Practitioner

## 2020-10-05 ENCOUNTER — Other Ambulatory Visit (INDEPENDENT_AMBULATORY_CARE_PROVIDER_SITE_OTHER): Payer: Medicaid Other

## 2020-10-05 VITALS — BP 118/74 | HR 80 | Ht 67.0 in | Wt 185.0 lb

## 2020-10-05 DIAGNOSIS — K863 Pseudocyst of pancreas: Secondary | ICD-10-CM | POA: Diagnosis not present

## 2020-10-05 DIAGNOSIS — K860 Alcohol-induced chronic pancreatitis: Secondary | ICD-10-CM | POA: Diagnosis not present

## 2020-10-05 DIAGNOSIS — R7989 Other specified abnormal findings of blood chemistry: Secondary | ICD-10-CM | POA: Diagnosis not present

## 2020-10-05 DIAGNOSIS — K869 Disease of pancreas, unspecified: Secondary | ICD-10-CM | POA: Diagnosis not present

## 2020-10-05 LAB — CBC WITH DIFFERENTIAL/PLATELET
Basophils Absolute: 0 10*3/uL (ref 0.0–0.1)
Basophils Relative: 0.4 % (ref 0.0–3.0)
Eosinophils Absolute: 0.1 10*3/uL (ref 0.0–0.7)
Eosinophils Relative: 1 % (ref 0.0–5.0)
HCT: 46.1 % — ABNORMAL HIGH (ref 36.0–46.0)
Hemoglobin: 14.8 g/dL (ref 12.0–15.0)
Lymphocytes Relative: 19.5 % (ref 12.0–46.0)
Lymphs Abs: 1.7 10*3/uL (ref 0.7–4.0)
MCHC: 32.1 g/dL (ref 30.0–36.0)
MCV: 84.4 fl (ref 78.0–100.0)
Monocytes Absolute: 0.7 10*3/uL (ref 0.1–1.0)
Monocytes Relative: 7.8 % (ref 3.0–12.0)
Neutro Abs: 6.2 10*3/uL (ref 1.4–7.7)
Neutrophils Relative %: 71.3 % (ref 43.0–77.0)
Platelets: 236 10*3/uL (ref 150.0–400.0)
RBC: 5.45 Mil/uL — ABNORMAL HIGH (ref 3.87–5.11)
RDW: 15.7 % — ABNORMAL HIGH (ref 11.5–15.5)
WBC: 8.7 10*3/uL (ref 4.0–10.5)

## 2020-10-05 LAB — TRIGLYCERIDES: Triglycerides: 127 mg/dL (ref 0.0–149.0)

## 2020-10-05 LAB — HEPATIC FUNCTION PANEL
ALT: 9 U/L (ref 0–35)
AST: 12 U/L (ref 0–37)
Albumin: 4.3 g/dL (ref 3.5–5.2)
Alkaline Phosphatase: 75 U/L (ref 39–117)
Bilirubin, Direct: 0.1 mg/dL (ref 0.0–0.3)
Total Bilirubin: 0.5 mg/dL (ref 0.2–1.2)
Total Protein: 7.5 g/dL (ref 6.0–8.3)

## 2020-10-05 LAB — LIPASE: Lipase: 90 U/L — ABNORMAL HIGH (ref 11.0–59.0)

## 2020-10-05 LAB — BASIC METABOLIC PANEL
BUN: 11 mg/dL (ref 6–23)
CO2: 29 mEq/L (ref 19–32)
Calcium: 9.4 mg/dL (ref 8.4–10.5)
Chloride: 102 mEq/L (ref 96–112)
Creatinine, Ser: 0.86 mg/dL (ref 0.40–1.20)
GFR: 83.07 mL/min (ref >60.00)
Glucose, Bld: 103 mg/dL — ABNORMAL HIGH (ref 70–99)
Potassium: 3.6 mEq/L (ref 3.5–5.1)
Sodium: 139 mEq/L (ref 135–145)

## 2020-10-05 LAB — FERRITIN: Ferritin: 36.1 ng/mL (ref 10.0–291.0)

## 2020-10-05 LAB — IRON: Iron: 76 ug/dL (ref 42–145)

## 2020-10-05 MED ORDER — FAMOTIDINE 20 MG PO TABS: 20.0000 mg | ORAL_TABLET | Freq: Every day | ORAL | 1 refills | Status: DC

## 2020-10-05 NOTE — Progress Notes (Signed)
10/05/2020 Amy Wolfe [id number] 1975/01/24   CHIEF COMPLAINT: Chronic pancreatitis, pancreatic pseudocyst   HISTORY OF PRESENT ILLNESS:  Amy Wolfe is a 45 year old female with a past medical history of anxiety, hypertension, hypothyroidism, lung nodule, liver abscess and portal vein thrombosis 02/2018, alcohol use disorder with associated acute and chronic pancreatitis. She denies having a history of CAD as documented in her Epic records. She presents to our office today as referred by Dr. Cordelia Poche for further evaluation regarding pancreatitis with a pancreatic pseudocyst per MRI. She was previously followed by gastroenterologist Dr. Lucilla Lame with [facility]. She is accompanied by her mother.   History of multiple hospital admissions for pancreatitis over the past 2 to 3 years. She was most recently admitted to the hospital 09/01/2020 due to having acute epigastric and RUQ pain which radiated between the shoulder blades with nausea and vomiting x 4 days.  Labs in the ED showed an alk phos level of 306.  Total bili 0.8.  AST 96.  ALT 146.  Lipase 70.  WBC 7.1.  Hemoglobin 16.0.  Platelet 267.  She was diagnosed with acute on chronic pancreatitis. She reported last alcohol intake was 1 year ago. 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 She was a also thought to have cannabis hyperemesis syndrome with a history of smoking marijuana daily x 30 years. She continues to smoke cigarettes. She received IV fluids, analgesics an antiemetics and her clinical status improved and she was discharged home on 09/05/2020 with the recommendations to follow up in our office for a possible endoscopic ultrasound.   She continues to have chronic epigastric and RUQ pain, no severe abdominal pain at this time. She also has chronic upper back pain between the shoulder blades. She remains nauseous. She has vomited a few times  since her hospital discharge. No frank hematemesis or coffee ground emesis.  She takes Zofran as needed with symptom relief.  No dysphagia or heartburn.  She is on Pantoprazole 40 mg daily.  She denies ever having an EGD.  She is passing a normal formed brown bowel movement most days.  No rectal bleeding or black stools.  Abstinent from alcohol for 1 year. Previously drank 1 1/2 fifth of liquor daily x 3 years.  No known family history of liver or pancreatic disease.    She reported being diagnosed with a liver abscess and a portal vein thrombosis which was diagnosed in the hospital in [place] around April 2020 for which she was prescribed Xarelto.  She likely received IV antibiotics for the liver abscess but she denied having an I&D or drain placement.  Further details are unclear.  She was initially evaluated by gastroenterologist Dr. Evangeline Gula will 05/28/2018 following hospital admission for pancreatitis and elevated LFTs (she apparently stopped taking Xarelto for 2 weeks). An abdominal/pelvic CT done during this hospitalization showed some nodularity to her liver with ascites in addition to the pancreatitis and there was some concern that she had cirrhosis.  She also had anasarca with hypoalbuminemia.  A paracentesis was not done.  Her alk phos level was 264.  Additional laboratory studies were done 05/31/2018, hepatitis A total antibody negative.  Hepatitis B surface antigen negative.  Hepatitis B surface antibody nonreactive.  Hepatitis C antibody 0.2 (negative).  HCV FibroSure fibrosis score 0.08/no fibrosis and necroinflammatory activity score 0.06/no activity.  AMA < 20.  Her most recent laboratory studies 09/04/2020  show an elevated alk phos 302.  AST 138.  ALT 167.  Total bili 3.3.  Lipase 30.  See further laboratory results below.   CBC Latest Ref Rng & Units 09/04/2020 09/03/2020 09/02/2020  WBC 4.0 - 10.5 K/uL 5.4 6.5 10.7(H)  Hemoglobin 12.0 - 15.0 g/dL 15.8(H) 13.5 14.0  Hematocrit 36.0 - 46.0 %  47.8(H) 42.5 43.3  Platelets 150 - 400 K/uL 237 217 237    CMP Latest Ref Rng & Units 09/04/2020 09/03/2020 09/02/2020  Glucose 70 - 99 mg/dL 116(H) 94 107(H)  BUN 6 - 20 mg/dL '6 11 14  ' Creatinine 0.44 - 1.00 mg/dL 0.78 0.77 0.69  Sodium 135 - 145 mmol/L 138 137 140  Potassium 3.5 - 5.1 mmol/L 3.7 3.1(L) 3.7  Chloride 98 - 111 mmol/L 101 103 106  CO2 22 - 32 mmol/L '27 28 27  ' Calcium 8.9 - 10.3 mg/dL 9.1 8.2(L) 8.7(L)  Total Protein 6.5 - 8.1 g/dL 7.6 6.5 -  Total Bilirubin 0.3 - 1.2 mg/dL 3.3(H) 1.4(H) -  Alkaline Phos 38 - 126 U/L 302(H) 194(H) -  AST 15 - 41 U/L 138(H) 28 -  ALT 0 - 44 U/L 167(H) 62(H) -     Abdominal MRI 08/22/2020: MRI ABDOMEN WITHOUT AND WITH CONTRAST   TECHNIQUE: Multiplanar multisequence MR imaging of the abdomen was performed both before and after the administration of intravenous contrast.   CONTRAST:  31m GADAVIST GADOBUTROL 1 MMOL/ML IV SOLN   COMPARISON:  CT on 09/01/2020 and 01/14/2020   FINDINGS: Lower chest: No acute findings.   Hepatobiliary: No hepatic masses identified. Chronic portal vein thrombosis with cavernous transformation is noted. Gallbladder is unremarkable. No evidence of biliary ductal dilatation.   Pancreas: Diffuse dilatation of the main pancreatic duct and pancreatic duct side branches is seen throughout the pancreas, as well as numerous calcifications visible on CT, consistent with chronic pancreatitis. No acute peripancreatic inflammatory changes are seen. A cystic lesion is seen in the pancreatic head measuring 2.8 x 2.8 cm, which is new since 01/14/2020 exam. This contains T1 hyperintense fluid, and no definite internal contrast enhancement is seen. This likely represents a pancreatic pseudocyst given evidence of pancreatitis.   Spleen:  Within normal limits in size and appearance.   Adrenals/Urinary Tract: No masses identified. No evidence of hydronephrosis.   Stomach/Bowel: Visualized portion unremarkable.    Vascular/Lymphatic: No pathologically enlarged lymph nodes identified. No acute vascular findings.   Other:  None.   Musculoskeletal:  No suspicious bone lesions identified.   IMPRESSION: Chronic pancreatitis. Chronic portal vein thrombosis with cavernous transformation.   2.8 cm cystic lesion in the pancreatic head is new since 01/14/2020, likely representing a pancreatic pseudocyst given evidence of pancreatitis. Recommend continued imaging follow-up with CT or MRI in 3 months.    CTAP with contrast 09/01/2020: TECHNIQUE: Multidetector CT imaging of the abdomen and pelvis was performed using the standard protocol following bolus administration of intravenous contrast.   CONTRAST:  1083mOMNIPAQUE IOHEXOL 350 MG/ML SOLN   COMPARISON:  01/14/2020   FINDINGS: Lower chest: Small right lower lobe nodule is noted measuring less than 5 mm best seen on image number 8 of series 4. This is stable in retrospect dating back to at least 2020 and felt to be benign in etiology.   Hepatobiliary: No focal liver abnormality is seen. No gallstones, gallbladder wall thickening, or biliary dilatation.   Pancreas: There again noted changes consistent with chronic pancreatitis with dilatation of the more peripheral pancreatic duct. There  is however now seen on the current exam a focal 3 cm mass lesion with peripheral enhancement and central decreased attenuation in the region of the head of the pancreas. This may represent interval growth of the hypodense area in the region of the head of the pancreas seen on prior exam.   Spleen: Normal in size without focal abnormality.   Adrenals/Urinary Tract: Adrenal glands are within normal limits. Kidneys are well visualized bilaterally. No renal calculi or obstructive changes are seen. Bladder is decompressed.   Stomach/Bowel: Scattered diverticular change of the colon is noted. No diverticulitis is seen. Appendix is within normal limits.  Small bowel and stomach are unremarkable.   Vascular/Lymphatic: Aortic atherosclerosis. No enlarged abdominal or pelvic lymph nodes.   Reproductive: Status post hysterectomy. No adnexal masses.   Other: No abdominal wall hernia or abnormality. No abdominopelvic ascites.   Musculoskeletal: No acute abnormality noted.   IMPRESSION: New 3 cm hypodense mass lesion in the head of the pancreas highly suspicious for underlying neoplasm. Further evaluation by means of contrast enhanced pancreas MR is recommended.   5 mm nodule in the right lower lobe stable dating back to 2020 and consistent with a benign etiology.   Diverticulosis without diverticulitis.   Past Medical History:  Diagnosis Date   Abdominal pain    Anxiety    Coronary artery disease    Dyspnea    GERD (gastroesophageal reflux disease)    H/O blood clots    Hypertension    Liver abscess    Pancreatitis    PONV (postoperative nausea and vomiting) 11/07/2018   Thyroid disease    Uterine fibroid    Past Surgical History:  Procedure Laterality Date   CESAREAN SECTION     EMBOLIZATION N/A 06/14/2018   Procedure: Uterine Embolization;  Surgeon: Algernon Huxley, MD;  Location: [facility];  Service: Cardiovascular;  Laterality: N/A;   laceration finger Right    LAPAROSCOPIC VAGINAL HYSTERECTOMY WITH SALPINGECTOMY Bilateral 10/28/2018   Procedure: LAPAROSCOPIC ASSISTED VAGINAL HYSTERECTOMY BILATERAL WITH SALPINGECTOMY;  Surgeon: Rubie Maid, MD;  Location: [facility];  Service: Gynecology;  Laterality: Bilateral;   THYROIDECTOMY, PARTIAL     VAGINAL HYSTERECTOMY Bilateral 10/28/2018   Procedure: HYSTERECTOMY VAGINAL WITH BILATERAL SALPINGECTOMY;  Surgeon: Rubie Maid, MD;  Location: [facility];  Service: Gynecology;  Laterality: Bilateral;   Social History: She is separated.  She has 1 daughter and 1 son.  She is unemployed.  She smokes cigarettes 1/2 ppd x 30 years and marijuana daily x  30 years.  No other drug  use.  See alcohol use history above.  Family History: Father died age 10 brain cancer with metastasis. Maternal grandmother bone cancer. Maternal grandfather lung cancer.  Paternal grandfather lung cancer. Brother died age 59 brain aneurysm. Four sisters, one sister with CVA (hemorrhagic) age 82.   No Known Allergies   Outpatient Encounter Medications as of 10/05/2020  Medication Sig   acetaminophen (TYLENOL 8 HOUR) 650 MG CR tablet Take 1 tablet (650 mg total) by mouth every 8 (eight) hours as needed for pain.   amLODipine (NORVASC) 10 MG tablet Take 1 tablet (10 mg total) by mouth at bedtime.   lipase/protease/amylase (CREON) 36000 UNITS CPEP capsule Take 1-2 capsules (36,000-72,000 Units total) by mouth See admin instructions. Take 2 capsules (72000u) by mouth three times daily with meals and take 1 capsule (36000u) by mouth twice daily with snacks   ondansetron (ZOFRAN) 4 MG tablet Take 1 tablet (4 mg total) by  mouth every 8 (eight) hours as needed for nausea or vomiting.   pantoprazole (PROTONIX) 40 MG tablet Take 1 tablet (40 mg total) by mouth daily.   rivaroxaban (XARELTO) 20 MG TABS tablet Take 1 tablet (20 mg total) by mouth daily with supper.   No facility-administered encounter medications on file as of 10/05/2020.   REVIEW OF SYSTEMS:  Gen: Denies fever, sweats or chills. No weight loss.  CV: Denies chest pain, palpitations or edema. Resp: Denies cough, shortness of breath of hemoptysis.  GI: Se HPI.  GU : Denies urinary burning, blood in urine, increased urinary frequency or incontinence. MS: Denies joint pain, muscles aches or weakness. Derm: Denies rash, itchiness, skin lesions or unhealing ulcers. Psych: + Anxiety.  Heme: Denies bruising, bleeding. Neuro:  Denies headaches, dizziness or paresthesias. Endo:  Denies any problems with DM, thyroid or adrenal function.  PHYSICAL EXAM: LMP 09/30/2018  BP 118/74   Pulse 80   Ht '5\' 7"'  (1.702 m)   Wt 185 lb (83.9 kg)   LMP  09/30/2018   SpO2 98%   BMI 28.98 kg/m  General: 45 year old female in NAD. Head: Normocephalic and atraumatic. Eyes:  Sclerae non-icteric, conjunctive pink. Ears: Normal auditory acuity. Mouth: Dentition intact. No ulcers or lesions.  Neck: Supple, no lymphadenopathy or thyromegaly.  Lungs: Clear bilaterally to auscultation without wheezes, crackles or rhonchi. Heart: Regular rate and rhythm. No murmur, rub or gallop appreciated.  Abdomen: Soft, non distended.  Moderate epigastric and RUQ tenderness without rebound or guarding.  No masses. No hepatosplenomegaly. Normoactive bowel sounds x 4 quadrants.  Rectal: Deferred.  Musculoskeletal: Symmetrical with no gross deformities. Skin: Warm and dry. No rash or lesions on visible extremities. Extremities: No edema. Neurological: Alert oriented x 4, no focal deficits.  Psychological:  Alert and cooperative. Normal mood and affect.  ASSESSMENT AND PLAN:  68) 45 year old female with a history of alcohol use disorder with recurrent acute on chronic pancreatitis with epigastric, RUQ and pain between the shoulder blades. Abdominal MRI 08/22/2020 identified a new 2.8cm cystic lesion in the head of the pancreas (likely pseudocyst),  dffuse dilatation of the main pancreatic duct and pancreatic duct side branches is seen throughout the pancreas, as well as numerous calcifications visible on CT consistent with chronic pancreatitis. No gallstones per CT and MRI.  -IgG4 and Triglyceride levels  -Abdominal MRI/MRCP with and without contrast -EGD with EUS benefits and risks discussed including risk with sedation, risk of bleeding, perforation, infection and pancreatitis. I will consult with our bilary team to review her prior and future MRI/MRCP results and to verify if an EUS is appropriate -No alcohol ever discussed with the patient -Continue Pantoprazole 40 mg daily -Continue Zofran 4 mg p.o. every 6 hours as needed  2) Chronic portal vein thrombosis  with cavernous transformation on Xarelto   3) Alcohol use disorder, abstinent from alcohol x 1 year. Elevated LFTs. Prior Hep A, B and C serologies were negative.  Normal AMA level. HIV negative. Normal liver without biliary ductal dilatation per CT and MRI. -Hepatic panel, IgG, (IgG4 was ordered but not completed due to entry error), triglycerides, iron, ferritin, ceruloplasmin, anti-smooth muscle antibody, ANA, alpha 1 antitrypsin, lipase, BMP and CBC. -She will eventually require hepatitis A and B vaccinations -Check INR (not done today)    CC:  Hubbard Hartshorn, FNP

## 2020-10-05 NOTE — Patient Instructions (Signed)
LABS:  Lab work has been ordered for you today. Our lab is located in the basement. Press "B" on the elevator. The lab is located at the first door on the left as you exit the elevator.  MEDICATION: We have sent the following medication to your pharmacy for you to pick up at your convenience: Famotidine 20 MG tablet at bedtime.  IMAGING: You will be contacted by [facility] (Your caller ID will indicate phone # [phone]) in the next 7 days to schedule your MRI. If you have not heard from them within 7 business days, please call [facility] at [phone] to follow up on the status of your appointment.    Our office will contact you to schedule and EGD/EUS.  It was great seeing you today! Thank you for entrusting me with your care and choosing [facility].  Noralyn Pick, CRNP  The [facility] providers would like to encourage you to use [facility] to communicate with providers for non-urgent requests or questions.  Due to long hold times on the telephone, sending your provider a message by [facility] may be faster and more efficient way to get a response. Please allow 48 business hours for a response.  Please remember that this is for non-urgent requests/questions.  If you are age 75 or older, your body mass index should be between 23-30. Your Body mass index is 28.98 kg/m. If this is out of the aforementioned range listed, please consider follow up with your Primary Care Provider.  If you are age 67 or younger, your body mass index should be between 19-25. Your Body mass index is 28.98 kg/m. If this is out of the aformentioned range listed, please consider follow up with your Primary Care Provider.

## 2020-10-06 NOTE — Progress Notes (Signed)
Agree with repeat imaging to document resolution of pancreatic pseudo cyst and further characterize it.  Alcohol cessation Reviewed and agree with documentation and assessment and plan.  Damaris Hippo , MD

## 2020-10-08 LAB — ANA: Anti Nuclear Antibody (ANA): NEGATIVE

## 2020-10-08 LAB — ANTI-SMOOTH MUSCLE ANTIBODY, IGG: Actin (Smooth Muscle) Antibody (IGG): <20 U (ref <20)

## 2020-10-08 LAB — IGG: IgG (Immunoglobin G), Serum: 1217 mg/dL (ref 600–1640)

## 2020-10-08 LAB — ALPHA-1-ANTITRYPSIN: A-1 Antitrypsin, Ser: 175 mg/dL (ref 83–199)

## 2020-10-08 LAB — CERULOPLASMIN: Ceruloplasmin: 31 mg/dL (ref 18–53)

## 2020-10-11 ENCOUNTER — Telehealth: Payer: Self-pay

## 2020-10-11 NOTE — Telephone Encounter (Signed)
-----   Message from Noralyn Pick, NP sent at 10/11/2020  3:23 PM EDT ----- Lenna Sciara, pls inform patient her triglyceride level, liver enzymes and autoimmune liver disease markers were all normal.  Her lipase level is slightly elevated and we will recheck that level in a few weeks.  Patient to proceed with her abdominal MRI/MRCP 10/14/2020 as scheduled.  I will contact her with the MRI results once received and I will verify the timing of her repeat laboratory studies and follow-up at that time. Thx

## 2020-10-11 NOTE — Telephone Encounter (Signed)
Notified patient of message from [name] , patient expressed understanding and agreement, no further questions at this time.

## 2020-10-13 LAB — TEST AUTHORIZATION

## 2020-10-13 LAB — FILARIA ANTIBODY (IGG4): Filaria Antibody (IgG4): 1.06 INDEX

## 2020-10-14 ENCOUNTER — Ambulatory Visit: Payer: Medicaid Other

## 2020-10-22 ENCOUNTER — Other Ambulatory Visit: Payer: Self-pay | Admitting: Nurse Practitioner

## 2020-10-22 ENCOUNTER — Other Ambulatory Visit: Payer: Self-pay

## 2020-10-22 ENCOUNTER — Ambulatory Visit
Admission: RE | Admit: 2020-10-22 | Discharge: 2020-10-22 | Disposition: A | Discharge status: Home / Self Care | Payer: Medicaid Other | Source: Ambulatory Visit | Attending: Nurse Practitioner | Admitting: Nurse Practitioner

## 2020-10-22 DIAGNOSIS — K860 Alcohol-induced chronic pancreatitis: Secondary | ICD-10-CM

## 2020-10-22 MED ORDER — GADOBUTROL 1 MMOL/ML IV SOLN
7.5000 mL | Freq: Once | INTRAVENOUS | Status: AC | PRN
  Administered 2020-10-22: 7.5 mL via INTRAVENOUS

## 2021-01-03 ENCOUNTER — Emergency Department
Admission: EM | Admit: 2021-01-03 | Discharge: 2021-01-03 | Disposition: A | Discharge status: Home / Self Care | Payer: Medicaid Other | Attending: Emergency Medicine | Admitting: Emergency Medicine

## 2021-01-03 ENCOUNTER — Other Ambulatory Visit: Payer: Self-pay

## 2021-01-03 ENCOUNTER — Emergency Department: Payer: Medicaid Other

## 2021-01-03 DIAGNOSIS — M25562 Pain in left knee: Secondary | ICD-10-CM | POA: Diagnosis present

## 2021-01-03 NOTE — ED Provider Notes (Signed)
° °  [facility] Provider Note    Event Date/Time   First MD Initiated Contact with Patient 01/03/21 1518     (approximate)   History   Knee Pain   HPI  Amy Wolfe is a 46 y.o. female who presented to the emergency department today because of concerns for left knee pain.  She states that the pain started 3 days ago.  She says that since then that she feels like her knee has been dislocating.  This is caused instability and she is now nervous to walk.  She says she has fallen roughly 10 times.  She denies any other injuries.  She says that she has history of dislocations of other joints in the past. Per chart review patient has seen orthopedics roughly 2 years ago for hip strain.     Physical Exam   Triage Vital Signs: ED Triage Vitals  Enc Vitals Group     BP 01/03/21 1411 (!) 173/105     Pulse Rate 01/03/21 1411 87     Resp 01/03/21 1411 18     Temp 01/03/21 1411 98 F (36.7 C)     Temp src --      SpO2 01/03/21 1411 100 %     Weight 01/03/21 1449 184 lb 15.5 oz (83.9 kg)     Height 01/03/21 1449 5\' 7"  (1.702 m)     Head Circumference --      Peak Flow --      Pain Score 01/03/21 1410 6     Pain Loc --      Pain Edu? --      Excl. in [place]? --     Most recent vital signs: Vitals:   01/03/21 1411  BP: (!) 173/105  Pulse: 87  Resp: 18  Temp: 98 F (36.7 C)  SpO2: 100%    General: Awake, no distress.  CV:  Good peripheral perfusion.  Resp:  Normal effort.  MSK:               No deformity. Mild swelling.   ED Results / Procedures / Treatments   Labs No labs   EKG  None   RADIOLOGY Left knee My interpretation: No fracture/No dislocation Radiologist report: Negative   PROCEDURES:  Critical Care performed: No  Procedures   MEDICATIONS ORDERED IN ED: Medications - No data to display   IMPRESSION / MDM / [place] / ED COURSE  I reviewed the triage vital signs and the nursing notes.  Differential  diagnosis includes, but is not limited to, left knee dislocation, arthritis, acute fracture, soft tissue/ligamental/meniscal injury.  X-ray here without acute abnormality. At this time have very low suspicion for septic arthritis. Do wonder if patient suffered soft tissue injury. Discussed this with the patient. Will put in knee immobilizer and have patient follow up with orthopedics.    FINAL CLINICAL IMPRESSION(S) / ED DIAGNOSES   Final diagnoses:  Acute pain of left knee     Rx / DC Orders   ED Discharge Orders     None        Note:  This document was prepared using Dragon voice recognition software and may include unintentional dictation errors.    Nance Pear, MD 01/03/21 [phone]

## 2021-01-03 NOTE — ED Triage Notes (Signed)
Pt come with c/o left knee pain after twisting her knee fw days ago. Pt state she thinks she may have dislocated it. Pt states she put the brace on to help.

## 2021-01-03 NOTE — Discharge Instructions (Signed)
Please be sure to follow up with the orthopedic doctor. You can use ice, compression and elevation to help with discomfort and swelling. Please seek medical attention for any worsening swelling, pain, discoloration or any other new or concerning symptoms.

## 2021-01-03 NOTE — ED Notes (Signed)
See triage note  presents with left knee pain states she stepped over her dog  twisted her knee

## 2021-02-21 ENCOUNTER — Other Ambulatory Visit: Payer: Self-pay

## 2021-02-21 ENCOUNTER — Emergency Department: Payer: Medicaid Other

## 2021-02-21 ENCOUNTER — Encounter: Payer: Self-pay | Admitting: Emergency Medicine

## 2021-02-21 ENCOUNTER — Emergency Department
Admission: EM | Admit: 2021-02-21 | Discharge: 2021-02-21 | Disposition: A | Discharge status: Home / Self Care | Payer: Medicaid Other | Attending: Student in an Organized Health Care Education/Training Program | Admitting: Student in an Organized Health Care Education/Training Program

## 2021-02-21 DIAGNOSIS — R739 Hyperglycemia, unspecified: Secondary | ICD-10-CM | POA: Diagnosis not present

## 2021-02-21 DIAGNOSIS — K861 Other chronic pancreatitis: Secondary | ICD-10-CM | POA: Diagnosis not present

## 2021-02-21 DIAGNOSIS — R1013 Epigastric pain: Secondary | ICD-10-CM | POA: Diagnosis present

## 2021-02-21 LAB — COMPREHENSIVE METABOLIC PANEL
ALT: 16 U/L (ref 0–44)
AST: 19 U/L (ref 15–41)
Albumin: 4.2 g/dL (ref 3.5–5.0)
Alkaline Phosphatase: 71 U/L (ref 38–126)
Anion gap: 8 (ref 5–15)
BUN: 11 mg/dL (ref 6–20)
CO2: 28 mmol/L (ref 22–32)
Calcium: 9.4 mg/dL (ref 8.9–10.3)
Chloride: 96 mmol/L — ABNORMAL LOW (ref 98–111)
Creatinine, Ser: 0.74 mg/dL (ref 0.44–1.00)
GFR, Estimated: >60 mL/min (ref >60)
Glucose, Bld: 305 mg/dL — ABNORMAL HIGH (ref 70–99)
Potassium: 3.8 mmol/L (ref 3.5–5.1)
Sodium: 132 mmol/L — ABNORMAL LOW (ref 135–145)
Total Bilirubin: 0.8 mg/dL (ref 0.3–1.2)
Total Protein: 7.9 g/dL (ref 6.5–8.1)

## 2021-02-21 LAB — CBC
HCT: 49.2 % — ABNORMAL HIGH (ref 36.0–46.0)
Hemoglobin: 15.5 g/dL — ABNORMAL HIGH (ref 12.0–15.0)
MCH: 26.9 pg (ref 26.0–34.0)
MCHC: 31.5 g/dL (ref 30.0–36.0)
MCV: 85.4 fL (ref 80.0–100.0)
Platelets: 369 10*3/uL (ref 150–400)
RBC: 5.76 MIL/uL — ABNORMAL HIGH (ref 3.87–5.11)
RDW: 13.3 % (ref 11.5–15.5)
WBC: 5.5 10*3/uL (ref 4.0–10.5)
nRBC: 0 % (ref 0.0–0.2)

## 2021-02-21 LAB — URINALYSIS, ROUTINE W REFLEX MICROSCOPIC
Bacteria, UA: NONE SEEN
Bilirubin Urine: NEGATIVE
Glucose, UA: 150 mg/dL — AB
Hgb urine dipstick: NEGATIVE
Ketones, ur: 20 mg/dL — AB
Leukocytes,Ua: NEGATIVE
Nitrite: NEGATIVE
Protein, ur: 30 mg/dL — AB
Specific Gravity, Urine: 1.033 — ABNORMAL HIGH (ref 1.005–1.030)
pH: 7 (ref 5.0–8.0)

## 2021-02-21 LAB — PREGNANCY, URINE: Preg Test, Ur: NEGATIVE

## 2021-02-21 LAB — LIPASE, BLOOD: Lipase: 118 U/L — ABNORMAL HIGH (ref 11–51)

## 2021-02-21 MED ORDER — DROPERIDOL 2.5 MG/ML IJ SOLN
2.5000 mg | Freq: Once | INTRAMUSCULAR | Status: AC
  Administered 2021-02-21: 2.5 mg via INTRAVENOUS
  Filled 2021-02-21: qty 2

## 2021-02-21 MED ORDER — ONDANSETRON 4 MG PO TBDP: 4.0000 mg | ORAL_TABLET | Freq: Three times a day (TID) | ORAL | 0 refills | Status: DC | PRN

## 2021-02-21 MED ORDER — SODIUM CHLORIDE 0.9 % IV BOLUS
1000.0000 mL | Freq: Once | INTRAVENOUS | Status: AC
  Administered 2021-02-21: 1000 mL via INTRAVENOUS

## 2021-02-21 MED ORDER — RIVAROXABAN 20 MG PO TABS: 20.0000 mg | ORAL_TABLET | Freq: Every day | ORAL | 1 refills | Status: DC

## 2021-02-21 MED ORDER — IOHEXOL 300 MG/ML  SOLN
100.0000 mL | Freq: Once | INTRAMUSCULAR | Status: AC | PRN
  Administered 2021-02-21: 100 mL via INTRAVENOUS
  Filled 2021-02-21: qty 100

## 2021-02-21 MED ORDER — MORPHINE SULFATE (PF) 4 MG/ML IV SOLN
4.0000 mg | Freq: Once | INTRAVENOUS | Status: AC
  Administered 2021-02-21: 4 mg via INTRAVENOUS
  Filled 2021-02-21: qty 1

## 2021-02-21 MED ORDER — PANCRELIPASE (LIP-PROT-AMYL) 36000-114000 UNITS PO CPEP: 36000.0000 [IU] | ORAL_CAPSULE | ORAL | 1 refills | Status: DC

## 2021-02-21 MED ORDER — MORPHINE SULFATE (PF) 4 MG/ML IV SOLN
4.0000 mg | INTRAVENOUS | Status: DC | PRN
  Administered 2021-02-21: 4 mg via INTRAVENOUS
  Filled 2021-02-21: qty 1

## 2021-02-21 MED ORDER — PANTOPRAZOLE SODIUM 40 MG PO TBEC: 40.0000 mg | DELAYED_RELEASE_TABLET | Freq: Every day | ORAL | 1 refills | Status: DC

## 2021-02-21 MED ORDER — OXYCODONE-ACETAMINOPHEN 10-325 MG PO TABS: 1.0000 | ORAL_TABLET | Freq: Four times a day (QID) | ORAL | 0 refills | Status: DC | PRN

## 2021-02-21 NOTE — ED Triage Notes (Signed)
Pt via EMS from home. Pt c/o back pain related to her pancreatitis for the past 2 weeks and also endorses nausea and vomiting.   HR 94  O2 96%  136/89   EMS started 20G L AC and gave 4mg  of Zofran

## 2021-02-21 NOTE — ED Provider Triage Note (Signed)
Emergency Medicine Provider Triage Evaluation Note  Amy Wolfe , a 46 y.o. female  was evaluated in triage.  Pt complains of epigastric pain, history of pancreatitis and cyst on pancreas.  Review of Systems  Positive: Abdominal pain Negative: Fever, chills  Physical Exam  BP (!) 133/93 (BP Location: Left Arm)    Resp (!) 24    Ht 5\' 7"  (1.702 m)    Wt 83.9 kg    LMP 09/30/2018    BMI 28.97 kg/m  Gen:   Awake, no distress   Resp:  Normal effort  MSK:   Moves extremities without difficulty  Other:  Abdomen extremely tender in the epigastric  Medical Decision Making  Medically screening exam initiated at 2:21 PM.  Appropriate orders placed.  Amy Wolfe was informed that the remainder of the evaluation will be completed by another provider, this initial triage assessment does not replace that evaluation, and the importance of remaining in the ED until their evaluation is complete.  Ordered morphine and Zofran for the patient   Versie Starks, Hershal Coria 02/21/21 1421

## 2021-02-21 NOTE — ED Provider Notes (Signed)
[facility] Provider Note    Event Date/Time   First MD Initiated Contact with Patient 02/21/21 1831     (approximate)   History   Abdominal Pain   HPI  Amy Wolfe is a 46 y.o. female history of chronic pancreatitis as well as suspected hyperemesis cannabinoid syndrome presents to the ER for 1 week of nausea vomiting epigastric pain radiating through to her back feels consistent with her pancreatitis.  No fevers.  Has had poor p.o. intake.  Denies any dysuria.  No blood in her stool no hematemesis.  No cough congestion or shortness of breath.  She denies any alcohol use but does smoke marijuana daily.     Physical Exam   Triage Vital Signs: ED Triage Vitals  Enc Vitals Group     BP 02/21/21 1416 (!) 133/93     Pulse Rate 02/21/21 1416 78     Resp 02/21/21 1416 (!) 24     Temp 02/21/21 1416 98.4 F (36.9 C)     Temp Source 02/21/21 1416 Oral     SpO2 02/21/21 1416 97 %     Weight 02/21/21 1414 184 lb 15.5 oz (83.9 kg)     Height 02/21/21 1414 5\' 7"  (1.702 m)     Head Circumference --      Peak Flow --      Pain Score 02/21/21 1413 10     Pain Loc --      Pain Edu? --      Excl. in [place]? --     Most recent vital signs: Vitals:   02/21/21 1416 02/21/21 1817  BP: (!) 133/93 (!) 130/99  Pulse: 78 76  Resp: (!) 24 18  Temp: 98.4 F (36.9 C)   SpO2: 97% 100%     Constitutional: Alert  Eyes: Conjunctivae are normal.  Head: Atraumatic. Nose: No congestion/rhinnorhea. Mouth/Throat: Mucous membranes are moist.   Neck: Painless ROM.  Cardiovascular:   Good peripheral circulation. Respiratory: Normal respiratory effort.  No retractions.  Gastrointestinal: Soft and nontender.  Musculoskeletal:  no deformity Neurologic:  MAE spontaneously. No gross focal neurologic deficits are appreciated.  Skin:  Skin is warm, dry and intact. No rash noted. Psychiatric: Mood and affect are normal. Speech and behavior are normal.    ED Results /  Procedures / Treatments   Labs (all labs ordered are listed, but only abnormal results are displayed) Labs Reviewed  LIPASE, BLOOD - Abnormal; Notable for the following components:      Result Value   Lipase 118 (*)    All other components within normal limits  COMPREHENSIVE METABOLIC PANEL - Abnormal; Notable for the following components:   Sodium 132 (*)    Chloride 96 (*)    Glucose, Bld 305 (*)    All other components within normal limits  CBC - Abnormal; Notable for the following components:   RBC 5.76 (*)    Hemoglobin 15.5 (*)    HCT 49.2 (*)    All other components within normal limits  URINALYSIS, ROUTINE W REFLEX MICROSCOPIC - Abnormal; Notable for the following components:   Color, Urine YELLOW (*)    APPearance HAZY (*)    Specific Gravity, Urine 1.033 (*)    Glucose, UA 150 (*)    Ketones, ur 20 (*)    Protein, ur 30 (*)    All other components within normal limits  PREGNANCY, URINE     EKG     RADIOLOGY Please see  ED Course for my review and interpretation.  I personally reviewed all radiographic images ordered to evaluate for the above acute complaints and reviewed radiology reports and findings.  These findings were personally discussed with the patient.  Please see medical record for radiology report.    PROCEDURES:  Critical Care performed:   Procedures   MEDICATIONS ORDERED IN ED: Medications  morphine (PF) 4 MG/ML injection 4 mg (4 mg Intravenous Given 02/21/21 1937)  morphine (PF) 4 MG/ML injection 4 mg (4 mg Intravenous Given 02/21/21 1422)  sodium chloride 0.9 % bolus 1,000 mL (1,000 mLs Intravenous New Bag/Given 02/21/21 1937)  droperidol (INAPSINE) 2.5 MG/ML injection 2.5 mg (2.5 mg Intravenous Given 02/21/21 1937)  iohexol (OMNIPAQUE) 300 MG/ML solution 100 mL (100 mLs Intravenous Contrast Given 02/21/21 2001)     IMPRESSION / MDM / ASSESSMENT AND PLAN / ED COURSE  I reviewed the triage vital signs and the nursing notes.                               Differential diagnosis includes, but is not limited to, pancreatitis, enteritis, SBO, pseudocyst, hemorrhage, hyperemesis syndrome  Patient presents to the ER for symptoms as described above most clinically consistent with chronic pancreatitis mildly elevated lipase no significant electrolyte abnormality.  She is hyperglycemic, normal anion gap and no acidosis.  We will give IV fluids symptomatic management will order CT imaging to evaluate for above complaints.   Clinical Course as of 02/21/21 2046  Mon Feb 21, 2021  2010 My independent review of CT abdomen I do not appreciate any obstruction no significant pancreatitis will await formal radiology report. [PR]  2035 Discussed results of CT imaging and lab results with patient.  States she is feeling significant improved.  She is currently eating ice chips.  She is requesting discharge home. [PR]    Clinical Course User Index [PR] Merlyn Lot, MD     FINAL CLINICAL IMPRESSION(S) / ED DIAGNOSES   Final diagnoses:  Chronic pancreatitis, unspecified pancreatitis type [facility])     Rx / DC Orders   ED Discharge Orders          Ordered    oxyCODONE-acetaminophen (PERCOCET) 10-325 MG tablet  Every 6 hours PRN        02/21/21 2042    ondansetron (ZOFRAN-ODT) 4 MG disintegrating tablet  Every 8 hours PRN        02/21/21 2042    lipase/protease/amylase (CREON) 36000 UNITS CPEP capsule  See admin instructions       Note to Pharmacy: **PLEASE NOTE TO PT TO SCHEDULE FOLLOW UP, PLEASE**   02/21/21 2043    pantoprazole (PROTONIX) 40 MG tablet  Daily       Note to Pharmacy: Please ask patient to see if her PCP will refill.   02/21/21 2043    rivaroxaban (XARELTO) 20 MG TABS tablet  Daily with supper        02/21/21 2043             Note:  This document was prepared using Dragon voice recognition software and may include unintentional dictation errors.    Merlyn Lot, MD 02/21/21 2046

## 2021-02-21 NOTE — ED Triage Notes (Signed)
Pt here with pancreatitis. 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 Pt in NAD in triage.

## 2021-02-24 ENCOUNTER — Ambulatory Visit (INDEPENDENT_AMBULATORY_CARE_PROVIDER_SITE_OTHER): Payer: Medicaid Other | Admitting: Internal Medicine

## 2021-02-24 ENCOUNTER — Encounter: Payer: Self-pay | Admitting: Internal Medicine

## 2021-02-24 VITALS — BP 110/68 | HR 110 | Temp 97.9°F | Resp 16 | Ht 67.0 in | Wt 157.1 lb

## 2021-02-24 DIAGNOSIS — K860 Alcohol-induced chronic pancreatitis: Secondary | ICD-10-CM

## 2021-02-24 DIAGNOSIS — R7309 Other abnormal glucose: Secondary | ICD-10-CM | POA: Diagnosis not present

## 2021-02-24 DIAGNOSIS — E871 Hypo-osmolality and hyponatremia: Secondary | ICD-10-CM

## 2021-02-24 LAB — BASIC METABOLIC PANEL
BUN/Creatinine Ratio: 5 (calc) — ABNORMAL LOW (ref 6–22)
BUN: 6 mg/dL — ABNORMAL LOW (ref 7–25)
CO2: 34 mmol/L — ABNORMAL HIGH (ref 20–32)
Calcium: 10.2 mg/dL (ref 8.6–10.2)
Chloride: 96 mmol/L — ABNORMAL LOW (ref 98–110)
Creat: 1.16 mg/dL — ABNORMAL HIGH (ref 0.50–0.99)
Glucose, Bld: 213 mg/dL — ABNORMAL HIGH (ref 65–99)
Potassium: 5.5 mmol/L — ABNORMAL HIGH (ref 3.5–5.3)
Sodium: 136 mmol/L (ref 135–146)

## 2021-02-24 LAB — CBC WITH DIFFERENTIAL/PLATELET
Absolute Monocytes: 701 cells/uL (ref 200–950)
Basophils Absolute: 37 cells/uL (ref 0–200)
Basophils Relative: 0.6 %
Eosinophils Absolute: 50 cells/uL (ref 15–500)
Eosinophils Relative: 0.8 %
HCT: 47.3 % — ABNORMAL HIGH (ref 35.0–45.0)
Hemoglobin: 15.1 g/dL (ref 11.7–15.5)
Lymphs Abs: 1972 cells/uL (ref 850–3900)
MCH: 27.7 pg (ref 27.0–33.0)
MCHC: 31.9 g/dL — ABNORMAL LOW (ref 32.0–36.0)
MCV: 86.6 fL (ref 80.0–100.0)
MPV: 10.5 fL (ref 7.5–12.5)
Monocytes Relative: 11.3 %
Neutro Abs: 3441 cells/uL (ref 1500–7800)
Neutrophils Relative %: 55.5 %
Platelets: 304 10*3/uL (ref 140–400)
RBC: 5.46 10*6/uL — ABNORMAL HIGH (ref 3.80–5.10)
RDW: 12.5 % (ref 11.0–15.0)
Total Lymphocyte: 31.8 %
WBC: 6.2 10*3/uL (ref 3.8–10.8)

## 2021-02-24 LAB — HEMOGLOBIN A1C
Hgb A1c MFr Bld: 8.8 % of total Hgb — ABNORMAL HIGH (ref <5.7)
Mean Plasma Glucose: 206 mg/dL
eAG (mmol/L): 11.4 mmol/L

## 2021-02-24 LAB — LIPASE: Lipase: 145 U/L — ABNORMAL HIGH (ref 7–60)

## 2021-02-24 MED ORDER — PROMETHAZINE HCL 12.5 MG PO TABS: 12.5000 mg | ORAL_TABLET | Freq: Three times a day (TID) | ORAL | 0 refills | Status: DC | PRN

## 2021-02-24 NOTE — Progress Notes (Deleted)
Established Patient Office Visit  Subjective:  Patient ID: Amy Wolfe, female    DOB: 03-15-1975  Age: 46 y.o. MRN: [id number]  CC: No chief complaint on file.   HPI Jesselyn Belenda Alviar presents for ED Follow up.   Discharge Date: 02/21/21 Hospital/facility: [facility] Diagnosis: chronic pancreatitis  Procedures/tests: Lipase 118, Na 132, glucose 305, UA negative. CT A/P: sequela of chronic pancreatitis with innumerable pancreatic head calcifications, pancreatic atrophy and ductal dilation. No fat stranding to suggest acute pancreatitis.  Consultants: None New medications: Oxycodone, Zofran, Creon Discontinued medications: None Status: {Blank multiple:19196::"better","worse","stable","fluctuating"}   Past Medical History:  Diagnosis Date   Abdominal pain    Anxiety    Coronary artery disease    Dyspnea    GERD (gastroesophageal reflux disease)    H/O blood clots    Hypertension    Liver abscess    Pancreatitis 2019   PONV (postoperative nausea and vomiting) 11/07/2018   Thyroid disease    Uterine fibroid     Past Surgical History:  Procedure Laterality Date   CESAREAN SECTION     EMBOLIZATION N/A 06/14/2018   Procedure: Uterine Embolization;  Surgeon: Algernon Huxley, MD;  Location: [facility];  Service: Cardiovascular;  Laterality: N/A;   laceration finger Right    LAPAROSCOPIC VAGINAL HYSTERECTOMY WITH SALPINGECTOMY Bilateral 10/28/2018   Procedure: LAPAROSCOPIC ASSISTED VAGINAL HYSTERECTOMY BILATERAL WITH SALPINGECTOMY;  Surgeon: Rubie Maid, MD;  Location: [facility];  Service: Gynecology;  Laterality: Bilateral;   THYROIDECTOMY, PARTIAL     VAGINAL HYSTERECTOMY Bilateral 10/28/2018   Procedure: HYSTERECTOMY VAGINAL WITH BILATERAL SALPINGECTOMY;  Surgeon: Rubie Maid, MD;  Location: [facility];  Service: Gynecology;  Laterality: Bilateral;    Family History  Problem Relation Age of Onset   Hypertension Mother    Diabetes Mother    Brain cancer  Father    Aneurysm Brother    Bone cancer Maternal Grandmother    Lung cancer Maternal Grandfather    Cancer Paternal Grandmother    Cancer Paternal Grandfather     Social History   Socioeconomic History   Marital status: Single    Spouse name: Not on file   Number of children: 2   Years of education: 12   Highest education level: Associate degree: academic program  Occupational History   Occupation: [employer]  Tobacco Use   Smoking status: Every Day    Packs/day: 0.50    Years: 35.00    Pack years: 17.50    Types: Cigarettes   Smokeless tobacco: Never  Vaping Use   Vaping Use: Never used  Substance and Sexual Activity   Alcohol use: Not Currently   Drug use: Yes    Types: Marijuana    Comment: daily   Sexual activity: Yes    Birth control/protection: Surgical  Other Topics Concern   Not on file  Social History Narrative   Not on file   Social Determinants of Health   Financial Resource Strain: Not on file  Food Insecurity: Not on file  Transportation Needs: Not on file  Physical Activity: Not on file  Stress: Not on file  Social Connections: Not on file  Intimate Partner Violence: Not on file    Outpatient Medications Prior to Visit  Medication Sig Dispense Refill   acetaminophen (TYLENOL 8 HOUR) 650 MG CR tablet Take 1 tablet (650 mg total) by mouth every 8 (eight) hours as needed for pain. 30 tablet 0   amLODipine (NORVASC) 10 MG tablet Take 1 tablet (  10 mg total) by mouth at bedtime. 30 tablet 1   famotidine (PEPCID) 20 MG tablet Take 1 tablet (20 mg total) by mouth at bedtime. 30 tablet 1   lipase/protease/amylase (CREON) 36000 UNITS CPEP capsule Take 1-2 capsules (36,000-72,000 Units total) by mouth See admin instructions. Take 2 capsules (72000u) by mouth three times daily with meals and take 1 capsule (36000u) by mouth twice daily with snacks 240 capsule 1   ondansetron (ZOFRAN) 4 MG tablet Take 1 tablet (4 mg total) by mouth every 8 (eight) hours as needed  for nausea or vomiting. 20 tablet 0   ondansetron (ZOFRAN-ODT) 4 MG disintegrating tablet Take 1 tablet (4 mg total) by mouth every 8 (eight) hours as needed for nausea or vomiting. 12 tablet 0   oxyCODONE-acetaminophen (PERCOCET) 10-325 MG tablet Take 1 tablet by mouth every 6 (six) hours as needed for pain. 8 tablet 0   pantoprazole (PROTONIX) 40 MG tablet Take 1 tablet (40 mg total) by mouth daily. 30 tablet 1   rivaroxaban (XARELTO) 20 MG TABS tablet Take 1 tablet (20 mg total) by mouth daily with supper. 30 tablet 1   No facility-administered medications prior to visit.    No Known Allergies  ROS Review of Systems    Objective:    Physical Exam  LMP 09/30/2018  Wt Readings from Last 3 Encounters:  02/21/21 184 lb 15.5 oz (83.9 kg)  01/03/21 184 lb 15.5 oz (83.9 kg)  10/05/20 185 lb (83.9 kg)     Health Maintenance Due  Topic Date Due   COVID-19 Vaccine (1) Never done   PAP SMEAR-Modifier  Never done   INFLUENZA VACCINE  Never done    There are no preventive care reminders to display for this patient.  Lab Results  Component Value Date   TSH 1.613 01/16/2020   Lab Results  Component Value Date   WBC 5.5 02/21/2021   HGB 15.5 (H) 02/21/2021   HCT 49.2 (H) 02/21/2021   MCV 85.4 02/21/2021   PLT 369 02/21/2021   Lab Results  Component Value Date   NA 132 (L) 02/21/2021   K 3.8 02/21/2021   CO2 28 02/21/2021   GLUCOSE 305 (H) 02/21/2021   BUN 11 02/21/2021   CREATININE 0.74 02/21/2021   BILITOT 0.8 02/21/2021   ALKPHOS 71 02/21/2021   AST 19 02/21/2021   ALT 16 02/21/2021   PROT 7.9 02/21/2021   ALBUMIN 4.2 02/21/2021   CALCIUM 9.4 02/21/2021   ANIONGAP 8 02/21/2021   GFR 83.07 10/05/2020   No results found for: CHOL No results found for: HDL No results found for: [facility] Lab Results  Component Value Date   TRIG 127.0 10/05/2020   No results found for: CHOLHDL Lab Results  Component Value Date   HGBA1C 7.1 (H) 02/05/2020      Assessment  & Plan:   Problem List Items Addressed This Visit   None   No orders of the defined types were placed in this encounter.   Follow-up: No follow-ups on file.    Teodora Medici, DO

## 2021-02-24 NOTE — Assessment & Plan Note (Signed)
Reviewed labs and most recent scan, needs to see GI. Discussed bowel rest and low fat/bland diet. Continue PPI, Creon.

## 2021-02-24 NOTE — Progress Notes (Signed)
Established Patient Office Visit  Subjective:  Patient ID: Amy Wolfe, female    DOB: 08-26-1975  Age: 46 y.o. MRN: [id number]  CC:  Chief Complaint  Patient presents with   Follow-up    ER   Pancreatitis    HPI Amy Wolfe presents for ED Follow up.   Discharge Date: 02/21/21 Hospital/facility: [facility] Diagnosis: chronic pancreatitis  Procedures/tests: Lipase 118, Na 132, glucose 305, UA negative. CT A/P: sequela of chronic pancreatitis with innumerable pancreatic head calcifications, pancreatic atrophy and ductal dilation. No fat stranding to suggest acute pancreatitis.  Consultants: None New medications: Oxycodone, Zofran, Creon Discontinued medications: None Status: fluctuating  Alcohol Induced Pancreatitis: -Stopped drinking about 1 year ago -Has flares of severe cramping/sharp epigastric pains that radiate to the back on a monthly basis -Pain is worse with eating any time of food, presented in the room today with a frappe from [place] and had just eaten a fish sandwich now 10/10 pain -Had been seeing GI in [place] but hasn't been seen since October -Taking Oxycodone from ER for pain relief, Zofran which doesn't help, Creon and PPI  Past Medical History:  Diagnosis Date   Abdominal pain    Anxiety    Coronary artery disease    Dyspnea    GERD (gastroesophageal reflux disease)    H/O blood clots    Hypertension    Liver abscess    Pancreatitis 2019   PONV (postoperative nausea and vomiting) 11/07/2018   Thyroid disease    Uterine fibroid     Past Surgical History:  Procedure Laterality Date   CESAREAN SECTION     EMBOLIZATION N/A 06/14/2018   Procedure: Uterine Embolization;  Surgeon: Algernon Huxley, MD;  Location: [place] [facility];  Service: Cardiovascular;  Laterality: N/A;   laceration finger Right    LAPAROSCOPIC VAGINAL HYSTERECTOMY WITH SALPINGECTOMY Bilateral 10/28/2018   Procedure: LAPAROSCOPIC ASSISTED VAGINAL HYSTERECTOMY  BILATERAL WITH SALPINGECTOMY;  Surgeon: Rubie Maid, MD;  Location: [facility];  Service: Gynecology;  Laterality: Bilateral;   THYROIDECTOMY, PARTIAL     VAGINAL HYSTERECTOMY Bilateral 10/28/2018   Procedure: HYSTERECTOMY VAGINAL WITH BILATERAL SALPINGECTOMY;  Surgeon: Rubie Maid, MD;  Location: [facility];  Service: Gynecology;  Laterality: Bilateral;    Family History  Problem Relation Age of Onset   Hypertension Mother    Diabetes Mother    Brain cancer Father    Aneurysm Brother    Bone cancer Maternal Grandmother    Lung cancer Maternal Grandfather    Cancer Paternal Grandmother    Cancer Paternal Grandfather     Social History   Socioeconomic History   Marital status: Single    Spouse name: Not on file   Number of children: 2   Years of education: 12   Highest education level: Associate degree: academic program  Occupational History   Occupation: [employer]  Tobacco Use   Smoking status: Every Day    Packs/day: 0.50    Years: 35.00    Pack years: 17.50    Types: Cigarettes   Smokeless tobacco: Never  Vaping Use   Vaping Use: Never used  Substance and Sexual Activity   Alcohol use: Not Currently   Drug use: Yes    Types: Marijuana    Comment: daily   Sexual activity: Yes    Birth control/protection: Surgical  Other Topics Concern   Not on file  Social History Narrative   Not on file   Social Determinants of Radio broadcast assistant  Strain: Not on file  Food Insecurity: Not on file  Transportation Needs: Not on file  Physical Activity: Not on file  Stress: Not on file  Social Connections: Not on file  Intimate Partner Violence: Not on file    Outpatient Medications Prior to Visit  Medication Sig Dispense Refill   acetaminophen (TYLENOL 8 HOUR) 650 MG CR tablet Take 1 tablet (650 mg total) by mouth every 8 (eight) hours as needed for pain. 30 tablet 0   amLODipine (NORVASC) 10 MG tablet Take 1 tablet (10 mg total) by mouth at bedtime. 30 tablet 1    famotidine (PEPCID) 20 MG tablet Take 1 tablet (20 mg total) by mouth at bedtime. 30 tablet 1   lipase/protease/amylase (CREON) 36000 UNITS CPEP capsule Take 1-2 capsules (36,000-72,000 Units total) by mouth See admin instructions. Take 2 capsules (72000u) by mouth three times daily with meals and take 1 capsule (36000u) by mouth twice daily with snacks 240 capsule 1   ondansetron (ZOFRAN) 4 MG tablet Take 1 tablet (4 mg total) by mouth every 8 (eight) hours as needed for nausea or vomiting. 20 tablet 0   ondansetron (ZOFRAN-ODT) 4 MG disintegrating tablet Take 1 tablet (4 mg total) by mouth every 8 (eight) hours as needed for nausea or vomiting. 12 tablet 0   oxyCODONE-acetaminophen (PERCOCET) 10-325 MG tablet Take 1 tablet by mouth every 6 (six) hours as needed for pain. 8 tablet 0   pantoprazole (PROTONIX) 40 MG tablet Take 1 tablet (40 mg total) by mouth daily. 30 tablet 1   rivaroxaban (XARELTO) 20 MG TABS tablet Take 1 tablet (20 mg total) by mouth daily with supper. 30 tablet 1   No facility-administered medications prior to visit.    No Known Allergies  ROS Review of Systems  Constitutional:  Negative for chills and fever.  Gastrointestinal:  Positive for abdominal pain, constipation and nausea. Negative for blood in stool, diarrhea and vomiting.  Genitourinary:  Negative for dysuria and hematuria.     Objective:    Physical Exam Constitutional:      Appearance: Normal appearance.  HENT:     Head: Normocephalic and atraumatic.  Eyes:     Conjunctiva/sclera: Conjunctivae normal.  Cardiovascular:     Rate and Rhythm: Normal rate and regular rhythm.  Pulmonary:     Effort: Pulmonary effort is normal.     Breath sounds: Normal breath sounds.  Abdominal:     General: There is no distension.     Palpations: Abdomen is soft.     Tenderness: There is abdominal tenderness.     Comments: Hypoactive BS, severe pain to palpation in bilateral upper quadrants and epigastric area   Musculoskeletal:     Right lower leg: No edema.     Left lower leg: No edema.  Skin:    General: Skin is warm and dry.  Neurological:     General: No focal deficit present.     Mental Status: She is alert. Mental status is at baseline.  Psychiatric:        Mood and Affect: Mood normal.        Behavior: Behavior normal.    BP 110/68    Pulse (!) 110    Temp 97.9 F (36.6 C)    Resp 16    Ht 5\' 7"  (1.702 m)    Wt 157 lb 1.6 oz (71.3 kg)    LMP 09/30/2018    SpO2 96%    BMI 24.61 kg/m  Wt  Readings from Last 3 Encounters:  02/24/21 157 lb 1.6 oz (71.3 kg)  02/21/21 184 lb 15.5 oz (83.9 kg)  01/03/21 184 lb 15.5 oz (83.9 kg)     Health Maintenance Due  Topic Date Due   COVID-19 Vaccine (1) Never done   PAP SMEAR-Modifier  Never done   INFLUENZA VACCINE  Never done    There are no preventive care reminders to display for this patient.  Lab Results  Component Value Date   TSH 1.613 01/16/2020   Lab Results  Component Value Date   WBC 5.5 02/21/2021   HGB 15.5 (H) 02/21/2021   HCT 49.2 (H) 02/21/2021   MCV 85.4 02/21/2021   PLT 369 02/21/2021   Lab Results  Component Value Date   NA 132 (L) 02/21/2021   K 3.8 02/21/2021   CO2 28 02/21/2021   GLUCOSE 305 (H) 02/21/2021   BUN 11 02/21/2021   CREATININE 0.74 02/21/2021   BILITOT 0.8 02/21/2021   ALKPHOS 71 02/21/2021   AST 19 02/21/2021   ALT 16 02/21/2021   PROT 7.9 02/21/2021   ALBUMIN 4.2 02/21/2021   CALCIUM 9.4 02/21/2021   ANIONGAP 8 02/21/2021   GFR 83.07 10/05/2020   No results found for: CHOL No results found for: HDL No results found for: [facility] Lab Results  Component Value Date   TRIG 127.0 10/05/2020   No results found for: CHOLHDL Lab Results  Component Value Date   HGBA1C 7.1 (H) 02/05/2020      Assessment & Plan:   Problem List Items Addressed This Visit       Digestive   Pancreatitis, chronic ([place]) - Primary    Reviewed labs and most recent scan, needs to see GI. Discussed  bowel rest and low fat/bland diet. Continue PPI, Creon.       Relevant Medications   promethazine (PHENERGAN) 12.5 MG tablet   Other Relevant Orders   CBC w/Diff/Platelet   Lipase   Other Visit Diagnoses     Elevated glucose    - concern about new onset diabetes, check labs and follow up in 1 week.    Relevant Orders   HgB A1c   Hyponatremia       Relevant Orders   Basic Metabolic Panel (BMET)        Meds ordered this encounter  Medications   promethazine (PHENERGAN) 12.5 MG tablet    Sig: Take 1 tablet (12.5 mg total) by mouth every 8 (eight) hours as needed for nausea or vomiting.    Dispense:  20 tablet    Refill:  0    Follow-up: Return in about 1 week (around 03/03/2021).    Teodora Medici, DO

## 2021-02-24 NOTE — Patient Instructions (Addendum)
It was great seeing you today!  Plan discussed at today's visit: -Blood work ordered today, results will be uploaded to [place] rest 1-2 days, increase to liquids and bland foods as you can tolerate it - NO fat  -Continue Creon with food -Call your GI specialist ASAP: [place], [place] Phone: [phone]    Follow up in: 1 week   Take care and let us know if you have any questions or concerns prior to your next visit.  Dr. Rosana Berger

## 2021-03-03 ENCOUNTER — Other Ambulatory Visit: Payer: Self-pay

## 2021-03-03 ENCOUNTER — Ambulatory Visit: Payer: Medicaid Other | Admitting: Internal Medicine

## 2021-03-03 ENCOUNTER — Encounter: Payer: Self-pay | Admitting: Internal Medicine

## 2021-03-03 VITALS — BP 104/62 | HR 87 | Temp 98.2°F | Resp 16 | Ht 67.0 in | Wt 157.0 lb

## 2021-03-03 DIAGNOSIS — K863 Pseudocyst of pancreas: Secondary | ICD-10-CM

## 2021-03-03 DIAGNOSIS — K860 Alcohol-induced chronic pancreatitis: Secondary | ICD-10-CM

## 2021-03-03 DIAGNOSIS — E0865 Diabetes mellitus due to underlying condition with hyperglycemia: Secondary | ICD-10-CM | POA: Diagnosis not present

## 2021-03-03 DIAGNOSIS — I81 Portal vein thrombosis: Secondary | ICD-10-CM

## 2021-03-03 MED ORDER — OXYCODONE-ACETAMINOPHEN 5-325 MG PO TABS: 1.0000 | ORAL_TABLET | Freq: Two times a day (BID) | ORAL | 0 refills | Status: DC | PRN

## 2021-03-03 NOTE — Assessment & Plan Note (Signed)
Newly diagnosed diabetic due to chronic pancreatitis, c-peptide and insulin level today to assess need for insulin.  ?

## 2021-03-03 NOTE — Assessment & Plan Note (Signed)
Seeing GI next week, low fat diet, Creon/PPI, pain medications given until next week but discussed with the patient that I will not be prescribing pain medications long term. Referral placed to pain management.  ?

## 2021-03-03 NOTE — Assessment & Plan Note (Signed)
Had been following with vascular surgery previously for this issue, referral placed again today. Continue Xarelto.  ?

## 2021-03-03 NOTE — Patient Instructions (Addendum)
It was great seeing you today! ? ?Plan discussed at today's visit: ?-Blood work ordered today, results will be uploaded to [place].  ?-Referrals to vascular surgery and pain management today ?-Pain medication sent to pharmacy  ?-Low fat diet, stick to bland foods like clear broths soups, scrambled eggs and plain oatmeal, applesauce, etc  ? ? ?Follow up in: TBD ? ?Take care and let us know if you have any questions or concerns prior to your next visit. ? ?Dr. Rosana Berger ? ?

## 2021-03-03 NOTE — Assessment & Plan Note (Signed)
Seeing GI next week ?

## 2021-03-03 NOTE — Progress Notes (Signed)
Established Patient Office Visit  Subjective:  Patient ID: Amy Wolfe, female    DOB: 10/20/1975  Age: 46 y.o. MRN: [id number]  CC:  Chief Complaint  Patient presents with   Follow-up   Pancreatitis    Pt complaining that stays in so much pain   Diabetes    HPI Amy Wolfe presents for follow up. Her mother is accompanying her to the visit today.   Alcohol Induced Pancreatitis: -Stopped drinking about 1 year ago -Has flares of severe cramping/sharp epigastric pains that radiate to the back on a monthly basis -Pain is worse with eating any type of food -Had been seeing GI in [place] but hasn't been seen since October, appointment scheduled for next week -Taking Oxycodone from ER for pain relief, Zofran which doesn't help, Creon and PPI -At LOV, discussed liquid diet about 2 days, tried soup and boiled eggs. Any food makes pain worse, pain is worse in general today. Weight stable since last visit.  -CT A/P 02/21/21: 1. Sequela of chronic pancreatitis with innumerable pancreatic head calcifications, pancreatic atrophy, and ductal dilatation. The previous cystic lesion in the pancreatic head has diminished in size, now measuring 16 mm. There are multiple additional smaller cystic lesions in the pancreatic head the represent sequela of pancreatitis. No definite peripancreatic fat stranding or inflammation to suggest acute pancreatic inflammation. 2. Chronic portal vein thrombosis with cavernous transformation, chronic and unchanged. 3. Small hiatal hernia with wall thickening at the gastroesophageal junction, can be seen with reflux or esophagitis. 4. Mild hepatic steatosis. 5. Moderate colonic stool burden.  Diabetes, new onset: -Last A1c 8.8 2/23 -Medications: None  Past Medical History:  Diagnosis Date   Abdominal pain    Anxiety    Coronary artery disease    Dyspnea    GERD (gastroesophageal reflux disease)    H/O blood clots    Hypertension     Liver abscess    Pancreatitis 2019   PONV (postoperative nausea and vomiting) 11/07/2018   Thyroid disease    Uterine fibroid     Past Surgical History:  Procedure Laterality Date   CESAREAN SECTION     EMBOLIZATION N/A 06/14/2018   Procedure: Uterine Embolization;  Surgeon: Algernon Huxley, MD;  Location: [place] [facility];  Service: Cardiovascular;  Laterality: N/A;   laceration finger Right    LAPAROSCOPIC VAGINAL HYSTERECTOMY WITH SALPINGECTOMY Bilateral 10/28/2018   Procedure: LAPAROSCOPIC ASSISTED VAGINAL HYSTERECTOMY BILATERAL WITH SALPINGECTOMY;  Surgeon: Rubie Maid, MD;  Location: [facility];  Service: Gynecology;  Laterality: Bilateral;   THYROIDECTOMY, PARTIAL     VAGINAL HYSTERECTOMY Bilateral 10/28/2018   Procedure: HYSTERECTOMY VAGINAL WITH BILATERAL SALPINGECTOMY;  Surgeon: Rubie Maid, MD;  Location: [facility];  Service: Gynecology;  Laterality: Bilateral;    Family History  Problem Relation Age of Onset   Hypertension Mother    Diabetes Mother    Brain cancer Father    Aneurysm Brother    Bone cancer Maternal Grandmother    Lung cancer Maternal Grandfather    Cancer Paternal Grandmother    Cancer Paternal Grandfather     Social History   Socioeconomic History   Marital status: Single    Spouse name: Not on file   Number of children: 2   Years of education: 12   Highest education level: Associate degree: academic program  Occupational History   Occupation: [employer]  Tobacco Use   Smoking status: Every Day    Packs/day: 0.50    Years: 35.00  Pack years: 17.50    Types: Cigarettes   Smokeless tobacco: Never  Vaping Use   Vaping Use: Never used  Substance and Sexual Activity   Alcohol use: Not Currently   Drug use: Yes    Types: Marijuana    Comment: daily   Sexual activity: Yes    Birth control/protection: Surgical  Other Topics Concern   Not on file  Social History Narrative   Not on file   Social Determinants of Health   Financial  Resource Strain: Not on file  Food Insecurity: Not on file  Transportation Needs: Not on file  Physical Activity: Not on file  Stress: Not on file  Social Connections: Not on file  Intimate Partner Violence: Not on file    Outpatient Medications Prior to Visit  Medication Sig Dispense Refill   amLODipine (NORVASC) 10 MG tablet Take 1 tablet (10 mg total) by mouth at bedtime. (Patient not taking: Reported on 02/24/2021) 30 tablet 1   lipase/protease/amylase (CREON) 36000 UNITS CPEP capsule Take 1-2 capsules (36,000-72,000 Units total) by mouth See admin instructions. Take 2 capsules (72000u) by mouth three times daily with meals and take 1 capsule (36000u) by mouth twice daily with snacks 240 capsule 1   ondansetron (ZOFRAN-ODT) 4 MG disintegrating tablet Take 1 tablet (4 mg total) by mouth every 8 (eight) hours as needed for nausea or vomiting. 12 tablet 0   oxyCODONE-acetaminophen (PERCOCET) 10-325 MG tablet Take 1 tablet by mouth every 6 (six) hours as needed for pain. 8 tablet 0   pantoprazole (PROTONIX) 40 MG tablet Take 1 tablet (40 mg total) by mouth daily. 30 tablet 1   promethazine (PHENERGAN) 12.5 MG tablet Take 1 tablet (12.5 mg total) by mouth every 8 (eight) hours as needed for nausea or vomiting. 20 tablet 0   rivaroxaban (XARELTO) 20 MG TABS tablet Take 1 tablet (20 mg total) by mouth daily with supper. 30 tablet 1   No facility-administered medications prior to visit.    No Known Allergies  ROS Review of Systems  Constitutional:  Positive for activity change, appetite change and fatigue. Negative for chills and fever.  Gastrointestinal:  Positive for abdominal pain, nausea and vomiting. Negative for constipation and diarrhea.     Objective:    Physical Exam Constitutional:      Appearance: Normal appearance. She is ill-appearing.  HENT:     Head: Normocephalic and atraumatic.  Eyes:     Conjunctiva/sclera: Conjunctivae normal.  Cardiovascular:     Rate and  Rhythm: Normal rate and regular rhythm.  Pulmonary:     Effort: Pulmonary effort is normal.     Breath sounds: Normal breath sounds.  Abdominal:     General: There is no distension.     Palpations: Abdomen is soft.     Tenderness: There is abdominal tenderness.  Musculoskeletal:     Right lower leg: No edema.     Left lower leg: No edema.  Skin:    General: Skin is warm and dry.  Neurological:     General: No focal deficit present.     Mental Status: She is alert. Mental status is at baseline.  Psychiatric:        Mood and Affect: Mood normal.        Behavior: Behavior normal.    BP 104/62    Pulse 87    Temp 98.2 F (36.8 C) (Oral)    Resp 16    Ht 5\' 7"  (1.702 m)  Wt 157 lb (71.2 kg)    LMP 09/30/2018    SpO2 96%    BMI 24.59 kg/m  Wt Readings from Last 3 Encounters:  03/03/21 157 lb (71.2 kg)  02/24/21 157 lb 1.6 oz (71.3 kg)  02/21/21 184 lb 15.5 oz (83.9 kg)     Health Maintenance Due  Topic Date Due   PAP SMEAR-Modifier  Never done    There are no preventive care reminders to display for this patient.  Lab Results  Component Value Date   TSH 1.613 01/16/2020   Lab Results  Component Value Date   WBC 6.2 02/24/2021   HGB 15.1 02/24/2021   HCT 47.3 (H) 02/24/2021   MCV 86.6 02/24/2021   PLT 304 02/24/2021   Lab Results  Component Value Date   NA 136 02/24/2021   K 5.5 (H) 02/24/2021   CO2 34 (H) 02/24/2021   GLUCOSE 213 (H) 02/24/2021   BUN 6 (L) 02/24/2021   CREATININE 1.16 (H) 02/24/2021   BILITOT 0.8 02/21/2021   ALKPHOS 71 02/21/2021   AST 19 02/21/2021   ALT 16 02/21/2021   PROT 7.9 02/21/2021   ALBUMIN 4.2 02/21/2021   CALCIUM 10.2 02/24/2021   ANIONGAP 8 02/21/2021   GFR 83.07 10/05/2020   No results found for: CHOL No results found for: HDL No results found for: [facility] Lab Results  Component Value Date   TRIG 127.0 10/05/2020   No results found for: CHOLHDL Lab Results  Component Value Date   HGBA1C 8.8 (H) 02/24/2021       Assessment & Plan:   Problem List Items Addressed This Visit       Cardiovascular and Mediastinum   Portal vein thrombosis    Had been following with vascular surgery previously for this issue, referral placed again today. Continue Xarelto.       Relevant Orders   Ambulatory referral to Vascular Surgery     Digestive   Pancreatitis, chronic ([place]) - Primary    Seeing GI next week, low fat diet, Creon/PPI, pain medications given until next week but discussed with the patient that I will not be prescribing pain medications long term. Referral placed to pain management.       Relevant Medications   oxyCODONE-acetaminophen (PERCOCET/ROXICET) 5-325 MG tablet   Other Relevant Orders   Ambulatory referral to Pain Clinic   Insulin, Free (Bioactive)   C-peptide   Pancreatic pseudocyst    Seeing GI next week        Endocrine   Diabetes mellitus due to underlying condition with hyperglycemia, without long-term current use of insulin ([place])    Newly diagnosed diabetic due to chronic pancreatitis, c-peptide and insulin level today to assess need for insulin.       Relevant Orders   Insulin, Free (Bioactive)   C-peptide    Meds ordered this encounter  Medications   oxyCODONE-acetaminophen (PERCOCET/ROXICET) 5-325 MG tablet    Sig: Take 1 tablet by mouth every 12 (twelve) hours as needed for up to 5 days for severe pain.    Dispense:  10 tablet    Refill:  0    Follow-up: Return if symptoms worsen or fail to improve. - TBD after testing    Teodora Medici, DO

## 2021-03-07 ENCOUNTER — Telehealth: Payer: Self-pay | Admitting: Internal Medicine

## 2021-03-07 NOTE — Progress Notes (Signed)
03/08/2021 Amy Wolfe [id number] Jan 30, 1975   ASSESSMENT AND PLAN:    Acute on chronic pancreatitis (HCC) -     IgG 4; Future -     TSH; Future -     Comprehensive metabolic panel; Future -     CBC with Differential/Platelet; Future -     ANA; Future -     Lipase; Future -     MR ABDOMEN MRCP W WO CONTAST; Future Thought secondary to ETOH use, no alcohol for 1-2 years, continues to smoke tobacco and marijuana. get IgG4 serum immunoglobin G4 and ANA to evaluate for autoimmune.  Patient instructed to stop smoking, stratigies were discussed.  Will schedule for MRCP to evaluate further the pancrease as well as GB and ducts with most recent CT showing possible irregularities.   ? Need EGD with EUS - will contact Dr. Wendie Agreste. [place]/Dr. Silverio Decamp after return of MRCP.   Abdominal pain: Chronic pancreatitis versus gastritis seen on CT versus cannabis versus gallstones Increase pantoprazole BID, salon pas patches, stop marijuana Increase water and do low fat small meals.  Continue creon, make sure do it before ensure as well or any food. Gabapentin did not help, tramadol does not help Got 5 days worth 10 pills total on oxycodone 5 mg on 03/03/2021, has 1/2 pill left. She has 02/22/2021 for 8 pills 02/21/21 for oxcycdone 10 mg. Requesting pain medications, has appointment with pain management discussed with Dr. Silverio Decamp, will do 7 day of oxycodone 5 mg q 6 hours to hopefully last until her appointment.  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   History of Present Illness:  46 y.o. female  with a past medical history of anxiety, hypertension, hypothyroidism, lung nodule, liver abscess and portal vein thrombosis 02/2018 on  Xarelto, alcohol use disorder with associated acute and chronic pancreatitis and others listed below, known to Dr. Silverio Decamp returns to clinic today for evaluation of chronic pancreatitis.  Last seen in clinic 10/05/2020- reports last ETOH 1 year ago at that visit.  Previously drank 1 1/2 fifth of liquor daily x 3 years.   No known family history of liver or pancreatic disease.   Triglycerides, liver enzymes, autoimmune liver disease markers all normal. Patient did not have IgG4 ordered Lipase was 90 A1c 8.8 10/22/2020 MRCP showed interval resolution of cystic lesion of central pancreatic head now measuring 1.5 x 1.4 cm previously 3.1 x 2.9 consistent with resolving pancreatic pseudocyst.  New inflammatory fat stranding and fluid around the pancreatic head and adjacent right retroperitoneum consistent with acute on chronic pancreatitis, diffuse atrophy of pancreatic parenchyma and dilatation of the pancreatic duct from head distally consistent with stigmata of chronic pancreatitis.  Cavernous transformation of the portal vein, cholelithiasis.  Since that visit patient's had 2 more ER visits 01/03/2021 for acute left knee pain and other on 02/21/2021 for abdominal pain. 02/21/2021 CT abdomen pelvis with contrast for nausea and vomiting showed chronic pancreatitis with innumerable pancreatic head calcifications, pancreatic atrophy and ductal dilatation the previous cystic lesion in the pancreatic head is diminished in size now 16 mm there are multiple additional smaller cystic lesions pancreatic head, no definite peripancreatic fat stranding or inflammation suggestive of acute pancreatitis, chronic portal vein thrombosis with cavernous transformation chronic and unchanged, small  hiatal hernia with wall thickening at the gastroesophageal junction with reflux or esophagitis, mild hepatic steatosis and moderate colonic stool burden.  Physiologically distended gallbladder, and bile duct irregular in appearance  but nondilated, slight central intrahepatic biliary ductal dilatation  Patient did have alkaline phosphatase elevation of 09/04/20 and previously but has since been 75 and 71. Additional laboratory studies were done 05/31/2018, hepatitis A total antibody negative.  Hepatitis B surface antigen negative.  Hepatitis B surface antibody nonreactive.  Hepatitis C antibody 0.2 (negative).  HCV FibroSure fibrosis score 0.08/no fibrosis and necroinflammatory activity score 0.06/no activity.  AMA < 20.  She has AB pain with any food.  She is on 2 pills before meals and 1 before snacks.  She has constipation, no diarrhea.  She states she is in the hot showers to distract from the pain.  She has not been able to eat, she has lost about 30 since Jan.  She does have GERD, she is on pantoprazole once a day.   Wt Readings from Last 10 Encounters:  03/08/21 158 lb (71.7 kg)  03/03/21 157 lb (71.2 kg)  02/24/21 157 lb 1.6 oz (71.3 kg)  02/21/21 184 lb 15.5 oz (83.9 kg)  01/03/21 184 lb 15.5 oz (83.9 kg)  10/05/20 185 lb (83.9 kg)  09/01/20 210 lb (95.3 kg)  01/14/20 259 lb 14.8 oz (117.9 kg)  01/09/20 260 lb (117.9 kg)  06/01/19 170 lb (77.1 kg)     Previous GI history: Abdominal MRI 08/22/2020 identified a new 2.8cm cystic lesion in the head of the pancreas (likely pseudocyst),  dffuse dilatation of the main pancreatic duct and pancreatic duct side branches is seen throughout the pancreas, as well as numerous calcifications visible on CT consistent with chronic pancreatitis. No gallstones per CT and MRI.    CT ABDOMEN PELVIS W CONTRAST  Result Date: 02/21/2021 CLINICAL DATA:  Nausea and vomiting.  Acute severe pancreatitis. EXAM: CT ABDOMEN AND PELVIS WITH CONTRAST TECHNIQUE: Multidetector CT imaging of the abdomen and pelvis was performed using the standard protocol following bolus administration of intravenous contrast. RADIATION DOSE REDUCTION: This exam was performed according to the departmental  dose-optimization program which includes automated exposure control, adjustment of the mA and/or kV according to patient size and/or use of iterative reconstruction technique. CONTRAST:  15m OMNIPAQUE IOHEXOL 300 MG/ML  SOLN COMPARISON:  Most recent abdominopelvic CT 09/01/2020 FINDINGS: Lower chest: Subpleural right lower lobe nodule measures 4 mm, previously 5 mm, and is diminishing in density, consistent with benign etiology. No pleural fluid. Basilar airspace disease. Hepatobiliary: Mild hepatic steatosis. No focal hepatic lesion or intrahepatic collection. Physiologically distended gallbladder. Common bile duct is irregular in appearance but nondilated. Slight central intrahepatic biliary ductal dilatation. Pancreas: Sequela of chronic pancreatitis with innumerable pancreatic head calcifications, pancreatic atrophy, ductal dilatation common bile duct measures 10 mm in the body. The previous cystic lesion in the pancreatic head has diminished, measuring 16 mm, series 2, image 35. There multiple additional smaller cystic lesions in the pancreatic head. No definite surrounding peripancreatic fat stranding or inflammation. Spleen: Normal in size without focal abnormality. Adrenals/Urinary Tract: Normal adrenal glands. No hydronephrosis or perinephric edema. Homogeneous renal enhancement with symmetric excretion on delayed phase imaging. Urinary bladder is decompressed and not well assessed. Stomach/Bowel: Small hiatal hernia with wall thickening at the gastroesophageal junction. Stomach and duodenum are unremarkable. There is no small bowel obstruction, inflammation, or abnormal distention. Normal appendix. Moderate volume of stool throughout the colon. No colonic wall thickening. Vascular/Lymphatic: Mild  aortic atherosclerosis. Chronic portal vein thrombosis with cavernous transformation, chronic and unchanged. The splenic vein appears small in caliber but is patent. No abdominopelvic adenopathy. Reproductive:  Hysterectomy. Normal appearance of the left ovary. The right ovary is tentatively but not definitively visualized but normal. There is no adnexal mass. Other: No ascites.  No free air.  No abdominal wall hernia. Musculoskeletal: Facet mediated grade 1 anterolisthesis of L4 on L5. No focal bone lesion or acute osseous findings. Transitional lumbosacral anatomy. IMPRESSION: 1. Sequela of chronic pancreatitis with innumerable pancreatic head calcifications, pancreatic atrophy, and ductal dilatation. The previous cystic lesion in the pancreatic head has diminished in size, now measuring 16 mm. There are multiple additional smaller cystic lesions in the pancreatic head the represent sequela of pancreatitis. No definite peripancreatic fat stranding or inflammation to suggest acute pancreatic inflammation. 2. Chronic portal vein thrombosis with cavernous transformation, chronic and unchanged. 3. Small hiatal hernia with wall thickening at the gastroesophageal junction, can be seen with reflux or esophagitis. 4. Mild hepatic steatosis. 5. Moderate colonic stool burden. Aortic Atherosclerosis (ICD10-I70.0). Electronically Signed   By: Keith Rake M.D.   On: 02/21/2021 20:19    Current Medications:   Current Outpatient Medications (Endocrine & Metabolic):    metformin (FORTAMET) 500 MG (OSM) 24 hr tablet, Take 1 tablet (500 mg total) by mouth daily with breakfast.   Current Outpatient Medications (Respiratory):    promethazine (PHENERGAN) 12.5 MG tablet, Take 1 tablet (12.5 mg total) by mouth every 8 (eight) hours as needed for nausea or vomiting.  Current Outpatient Medications (Analgesics):    oxyCODONE-acetaminophen (PERCOCET/ROXICET) 5-325 MG tablet, Take 1 tablet by mouth every 12 (twelve) hours as needed for up to 5 days for severe pain.  Current Outpatient Medications (Hematological):    rivaroxaban (XARELTO) 20 MG TABS tablet, Take 1 tablet (20 mg total) by mouth daily with supper.  Current  Outpatient Medications (Other):    lipase/protease/amylase (CREON) 36000 UNITS CPEP capsule, Take 1-2 capsules (36,000-72,000 Units total) by mouth See admin instructions. Take 2 capsules (72000u) by mouth three times daily with meals and take 1 capsule (36000u) by mouth twice daily with snacks   ondansetron (ZOFRAN-ODT) 4 MG disintegrating tablet, Take 1 tablet (4 mg total) by mouth every 8 (eight) hours as needed for nausea or vomiting.   pantoprazole (PROTONIX) 40 MG tablet, Take 1 tablet (40 mg total) by mouth 2 (two) times daily before a meal.  Surgical History:  She  has a past surgical history that includes Thyroidectomy, partial; Cesarean section; EMBOLIZATION (N/A, 06/14/2018); laceration finger (Right); Laparoscopic vaginal hysterectomy with salpingectomy (Bilateral, 10/28/2018); and Vaginal hysterectomy (Bilateral, 10/28/2018). Family History:  Her family history includes Aneurysm in her brother; Bone cancer in her maternal grandmother; Brain cancer in her father; Cancer in her paternal grandfather and paternal grandmother; Diabetes in her mother; Hypertension in her mother; Lung cancer in her maternal grandfather. Social History:   reports that she has been smoking cigarettes. She has a 17.50 pack-year smoking history. She has never used smokeless tobacco. She reports that she does not currently use alcohol. She reports current drug use. Drug: Marijuana.  Current Medications, Allergies, Past Medical History, Past Surgical History, Family History and Social History were reviewed in [employer] record.  Physical Exam: BP 100/70    Pulse 76    Ht '5\' 7"'$  (1.702 m)    Wt 158 lb (71.7 kg)    LMP 09/30/2018    BMI 24.75 kg/m  General:  Pleasant, well developed female in no acute distress Eyes: sclerae anicteric,conjunctive pink  Heart:  regular rate and rhythm Pulm: Clear anteriorly; no wheezing Abdomen:  Soft,  nondistended  AB, skin exam normal, Normal bowel sounds.  marked tenderness in the epigastrium and in the RUQ. Without guarding and Without rebound, without hepatomegaly. Extremities:  Without edema. Peripheral pulses intact.  Neurologic:  Alert and  oriented x4;  grossly normal neurologically. Skin:   Dry and intact without significant lesions or rashes. Psychiatric: Demonstrates good judgement and reason without abnormal affect or behaviors.  Vladimir Crofts, PA-C 03/08/21

## 2021-03-07 NOTE — Telephone Encounter (Signed)
Pt is calling to receive lab results from 03/03/21. ?CB- [phone] ?

## 2021-03-08 ENCOUNTER — Encounter: Payer: Self-pay | Admitting: Physician Assistant

## 2021-03-08 ENCOUNTER — Ambulatory Visit: Payer: Medicaid Other | Admitting: Physician Assistant

## 2021-03-08 ENCOUNTER — Other Ambulatory Visit (INDEPENDENT_AMBULATORY_CARE_PROVIDER_SITE_OTHER): Payer: Medicaid Other

## 2021-03-08 VITALS — BP 100/70 | HR 76 | Ht 67.0 in | Wt 158.0 lb

## 2021-03-08 DIAGNOSIS — K861 Other chronic pancreatitis: Secondary | ICD-10-CM

## 2021-03-08 DIAGNOSIS — K859 Acute pancreatitis without necrosis or infection, unspecified: Secondary | ICD-10-CM | POA: Diagnosis not present

## 2021-03-08 DIAGNOSIS — Z720 Tobacco use: Secondary | ICD-10-CM | POA: Diagnosis not present

## 2021-03-08 DIAGNOSIS — F12288 Cannabis dependence with other cannabis-induced disorder: Secondary | ICD-10-CM

## 2021-03-08 DIAGNOSIS — K860 Alcohol-induced chronic pancreatitis: Secondary | ICD-10-CM

## 2021-03-08 DIAGNOSIS — I81 Portal vein thrombosis: Secondary | ICD-10-CM

## 2021-03-08 DIAGNOSIS — K219 Gastro-esophageal reflux disease without esophagitis: Secondary | ICD-10-CM

## 2021-03-08 LAB — COMPREHENSIVE METABOLIC PANEL
ALT: 8 U/L (ref 0–35)
AST: 11 U/L (ref 0–37)
Albumin: 3.9 g/dL (ref 3.5–5.2)
Alkaline Phosphatase: 66 U/L (ref 39–117)
BUN: 7 mg/dL (ref 6–23)
CO2: 30 mEq/L (ref 19–32)
Calcium: 9.4 mg/dL (ref 8.4–10.5)
Chloride: 100 mEq/L (ref 96–112)
Creatinine, Ser: 0.75 mg/dL (ref 0.40–1.20)
GFR: 97.61 mL/min (ref >60.00)
Glucose, Bld: 234 mg/dL — ABNORMAL HIGH (ref 70–99)
Potassium: 4.1 mEq/L (ref 3.5–5.1)
Sodium: 135 mEq/L (ref 135–145)
Total Bilirubin: 0.3 mg/dL (ref 0.2–1.2)
Total Protein: 6.9 g/dL (ref 6.0–8.3)

## 2021-03-08 LAB — CBC WITH DIFFERENTIAL/PLATELET
Basophils Absolute: 0 10*3/uL (ref 0.0–0.1)
Basophils Relative: 0.7 % (ref 0.0–3.0)
Eosinophils Absolute: 0.1 10*3/uL (ref 0.0–0.7)
Eosinophils Relative: 1.2 % (ref 0.0–5.0)
HCT: 44.2 % (ref 36.0–46.0)
Hemoglobin: 14.6 g/dL (ref 12.0–15.0)
Lymphocytes Relative: 20.6 % (ref 12.0–46.0)
Lymphs Abs: 1.2 10*3/uL (ref 0.7–4.0)
MCHC: 32.9 g/dL (ref 30.0–36.0)
MCV: 85.5 fl (ref 78.0–100.0)
Monocytes Absolute: 0.5 10*3/uL (ref 0.1–1.0)
Monocytes Relative: 7.9 % (ref 3.0–12.0)
Neutro Abs: 4.2 10*3/uL (ref 1.4–7.7)
Neutrophils Relative %: 69.6 % (ref 43.0–77.0)
Platelets: 280 10*3/uL (ref 150.0–400.0)
RBC: 5.18 Mil/uL — ABNORMAL HIGH (ref 3.87–5.11)
RDW: 13.9 % (ref 11.5–15.5)
WBC: 6 10*3/uL (ref 4.0–10.5)

## 2021-03-08 LAB — LIPASE: Lipase: 91 U/L — ABNORMAL HIGH (ref 11.0–59.0)

## 2021-03-08 LAB — TSH: TSH: 1.76 u[IU]/mL (ref 0.35–5.50)

## 2021-03-08 MED ORDER — PANTOPRAZOLE SODIUM 40 MG PO TBEC: 40.0000 mg | DELAYED_RELEASE_TABLET | Freq: Two times a day (BID) | ORAL | 1 refills | Status: DC

## 2021-03-08 MED ORDER — OXYCODONE-ACETAMINOPHEN 5-325 MG PO TABS: 1.0000 | ORAL_TABLET | Freq: Four times a day (QID) | ORAL | 0 refills | Status: DC | PRN

## 2021-03-08 MED ORDER — METFORMIN HCL ER (OSM) 500 MG PO TB24: 500.0000 mg | ORAL_TABLET | Freq: Every day | ORAL | 1 refills | Status: DC

## 2021-03-08 NOTE — Addendum Note (Signed)
Addended by: Teodora Medici on: 03/08/2021 09:34 AM ? ? Modules accepted: Orders ? ?

## 2021-03-08 NOTE — Patient Instructions (Addendum)
If you are age 44 or older, your body mass index should be between 23-30. Your Body mass index is 24.75 kg/m?Marland Kitchen If this is out of the aforementioned range listed, please consider follow up with your Primary Care Provider. ? ?If you are age 40 or younger, your body mass index should be between 19-25. Your Body mass index is 24.75 kg/m?Marland Kitchen If this is out of the aformentioned range listed, please consider follow up with your Primary Care Provider.  ? ?________________________________________________________ ? ?The [place] [facility] providers would like to encourage you to use [facility] to communicate with providers for non-urgent requests or questions.  Due to long hold times on the telephone, sending your provider a message by [facility] may be a faster and more efficient way to get a response.  Please allow 48 business hours for a response.  Please remember that this is for non-urgent requests.  ?_______________________________________________________ ?Your provider has requested that you go to the basement level for lab work before leaving today. Press "B" on the elevator. The lab is located at the first door on the left as you exit the elevator. ? ?You have been scheduled for an MRI at [facility] on 03-16-2021. Your appointment time is 8am. Please arrive to admitting (at main entrance of the hospital) 15 minutes prior to your appointment time for registration purposes. Please make certain not to have anything to eat or drink 6 hours prior to your test. In addition, if you have any metal in your body, have a pacemaker or defibrillator, please be sure to let your ordering physician know. This test typically takes 45 minutes to 1 hour to complete. Should you need to reschedule, please call [phone] to do so. ? ?Due to recent changes in healthcare laws, you may see the results of your imaging and laboratory studies on MyChart before your provider has had a chance to review them.  We understand that in some cases there  may be results that are confusing or concerning to you. Not all laboratory results come back in the same time frame and the provider may be waiting for multiple results in order to interpret others.  Please give [place] 48 hours in order for your provider to thoroughly review all the results before contacting the office for clarification of your results.  ? ? ? ? ? ?Do smaller more frequent meals ?Try to do low fat ?Do more lean protein ?Avoid too much fiber like lentils and beans- do smaller portions.  ?Avoid alcohol and do not smoke.  ?Remember to take the pills 2 with every meal and 1 with snacks ? ?Please take your proton pump inhibitor medication 30 minutes to 1 hour before meals- this makes it more effective.  ?Avoid spicy and acidic foods ?Avoid fatty foods ?Limit your intake of coffee, tea, alcohol, and carbonated drinks ?Work to maintain a healthy weight ?Keep the head of the bed elevated at least 3 inches with blocks or a wedge pillow if you are having any nighttime symptoms ?Stay upright for 2 hours after eating ?Avoid meals and snacks three to four hours before bedtime ?Stop smoking ? ?Try salon pas patches ? ?Miralax is an osmotic laxative.  ?It only brings more water into the stool.  ?This is safe to take daily.  ?Can take up to 17 gram of miralax twice a day.  ?Mix with juice or coffee.  ?Start 1 capful at night for 3-4 days and reassess your response in 3-4 days.  ?You can increase  and decrease the dose based on your response.  ?Remember, it can take up to 3-4 days to take effect OR for the effects to wear off.  ? ?I often pair this with benefiber in the morning to help assure the stool is not too loose.  ? ?

## 2021-03-08 NOTE — Telephone Encounter (Signed)
Some results still pending.

## 2021-03-09 ENCOUNTER — Telehealth: Payer: Self-pay | Admitting: Physician Assistant

## 2021-03-09 LAB — C-PEPTIDE: C-Peptide: 2.41 ng/mL (ref 0.80–3.85)

## 2021-03-09 LAB — IGG 4: IgG, Subclass 4: 22 mg/dL (ref 2–96)

## 2021-03-09 LAB — ANA: Anti Nuclear Antibody (ANA): NEGATIVE

## 2021-03-09 LAB — INSULIN, FREE (BIOACTIVE): Insulin, Free: 7.7 u[IU]/mL (ref 1.5–14.9)

## 2021-03-09 NOTE — Telephone Encounter (Signed)
Patient called and said [place] needs a preauthorization for her oxycodone so that her Medicaid will pay for it.  Patient says she is in a tremendous amount of pain and needs this med.  Patient also requested she be called when the medication has been preauthorized so she can go pick it up.  Thank you. ?

## 2021-03-09 NOTE — Telephone Encounter (Signed)
Spoke with patient & updated her on the medication issue, and informed her that we will try our best to get it worked out.  ?

## 2021-03-09 NOTE — Telephone Encounter (Signed)
Spoke with [place] regarding patient's oxycodone prescription, and they stated medicaid denied coverage since the dosage period is longer than 5 days. Therefore, patient is unable to pick up oxycodone prescription.  ? ? ?Estill Bamberg, please advise. Thanks! ? ?

## 2021-03-10 ENCOUNTER — Telehealth: Payer: Self-pay

## 2021-03-10 NOTE — Telephone Encounter (Signed)
Updated PA has been sent in to cover my meds. ?

## 2021-03-10 NOTE — Telephone Encounter (Signed)
PA was accepted. Per [place], patient has already picked up medication.  ?

## 2021-03-11 NOTE — Telephone Encounter (Signed)
Erroneous encounter

## 2021-03-16 ENCOUNTER — Ambulatory Visit (HOSPITAL_COMMUNITY): Admission: RE | Admit: 2021-03-16 | Payer: Medicaid Other | Source: Ambulatory Visit

## 2021-03-16 ENCOUNTER — Other Ambulatory Visit: Payer: Self-pay | Admitting: Physician Assistant

## 2021-03-16 DIAGNOSIS — K859 Acute pancreatitis without necrosis or infection, unspecified: Secondary | ICD-10-CM

## 2021-03-21 ENCOUNTER — Telehealth: Payer: Self-pay | Admitting: Physician Assistant

## 2021-03-21 DIAGNOSIS — K860 Alcohol-induced chronic pancreatitis: Secondary | ICD-10-CM

## 2021-03-21 MED ORDER — OXYCODONE-ACETAMINOPHEN 5-325 MG PO TABS: 1.0000 | ORAL_TABLET | Freq: Four times a day (QID) | ORAL | 0 refills | Status: AC | PRN

## 2021-03-21 NOTE — Telephone Encounter (Signed)
Inbound call from patient. Requesting medication refill for oxycodone  ?

## 2021-03-21 NOTE — Telephone Encounter (Signed)
Patient is aware that prescription has been sent in by [place], [place], and then she will not be able to have any more refills. She states she is scheduled to see pain clinic next month. ?

## 2021-03-21 NOTE — Telephone Encounter (Signed)
Filled 5 days #20 oxycodone on 03/10/2021. ?Checked with PDMP has not had any others from any other providers. ?Patient pending chronic pain management appointment we will give additional 5-day of oxycodone # 20. ?We will not be able to give any refills after that time. ?

## 2021-03-22 ENCOUNTER — Ambulatory Visit: Payer: Self-pay | Admitting: *Deleted

## 2021-03-22 DIAGNOSIS — E0865 Diabetes mellitus due to underlying condition with hyperglycemia: Secondary | ICD-10-CM

## 2021-03-22 NOTE — Telephone Encounter (Signed)
I returned pt's call.  Prescribed metformin (Fortamet) 500 mg 24 hour tablet.  She wants another medication because this one is making her stomach cramp terribly and she is having nausea.   Can't take it.   ? ? ?Reason for Disposition ? [1] Caller has URGENT medicine question about med that PCP or specialist prescribed AND [2] triager unable to answer question ?   Not tolerating the metformin (Fortamet) 500 mg 24 hour ? ?Answer Assessment - Initial Assessment Questions ?1. NAME of MEDICATION: "What medicine are you calling about?" ?    Metformin 500 mg 24 hour tablet  Fortamet ?Started a week ago.  Just found out I have diabetes.   ?2. QUESTION: "What is your question?" (e.g., double dose of medicine, side effect) ?    I'm having stomach cramps terribly.  I'm having nausea.   I vomited today.   ?3. PRESCRIBING HCP: "Who prescribed it?" Reason: if prescribed by specialist, call should be referred to that group. ?    Dr. Teodora Medici ?4. SYMPTOMS: "Do you have any symptoms?" ?    Yes stomach cramps terribly, nausea and today I've vomited a lot.   Is there something else I can take?    I can't take this medicine. ?5. SEVERITY: If symptoms are present, ask "Are they mild, moderate or severe?" ?    Severe ?6. PREGNANCY:  "Is there any chance that you are pregnant?" "When was your last menstrual period?" ?    Not asked ? ?Protocols used: Medication Question Call-A-AH ? ?

## 2021-03-22 NOTE — Telephone Encounter (Signed)
?  Chief Complaint: Not tolerating the metformin (Fortamet) 500 mg she started a week ago  ?Symptoms: terrible stomach cramping, nausea and today vomited a lot ?Frequency: A week ?Pertinent Negatives: Patient denies N/A ?Disposition: '[]'$ ED /'[]'$ Urgent Care (no appt availability in office) / '[]'$ Appointment(In office/virtual)/ '[]'$  [facility] [place] [facility]/ '[]'$ Home Care/ '[]'$ Refused Recommended Disposition /'[]'$ [facility]/ '[x]'$  Follow-up with PCP ?Additional Notes: I have sent a high priority message to Dr. Teodora Medici.   Pt was agreeable to someone calling her back.    ?

## 2021-03-24 ENCOUNTER — Ambulatory Visit: Payer: Self-pay | Admitting: *Deleted

## 2021-03-24 MED ORDER — BLOOD GLUCOSE MONITOR KIT: PACK | 0 refills | Status: DC

## 2021-03-24 NOTE — Addendum Note (Signed)
Addended by: Teodora Medici on: 03/24/2021 04:19 PM ? ? Modules accepted: Orders ? ?

## 2021-03-24 NOTE — Telephone Encounter (Signed)
Pt notified- will schedule.

## 2021-03-24 NOTE — Telephone Encounter (Signed)
Pt calling again. States she has not taken Metformin since Tuesday, feeling OK today, no cramping, N/V/D. Would like addressed, alternate med. Also states she has no means to check her blood sugars. ?Please advise. ?

## 2021-03-25 NOTE — Telephone Encounter (Signed)
Lvm for pt to call the office and schedule an appt  

## 2021-03-28 ENCOUNTER — Encounter: Payer: Self-pay | Admitting: Internal Medicine

## 2021-03-28 ENCOUNTER — Ambulatory Visit: Payer: Medicaid Other | Admitting: Internal Medicine

## 2021-03-28 VITALS — BP 110/80 | HR 97 | Temp 98.0°F | Resp 16 | Ht 67.0 in | Wt 151.0 lb

## 2021-03-28 DIAGNOSIS — E0865 Diabetes mellitus due to underlying condition with hyperglycemia: Secondary | ICD-10-CM | POA: Diagnosis not present

## 2021-03-28 DIAGNOSIS — K860 Alcohol-induced chronic pancreatitis: Secondary | ICD-10-CM

## 2021-03-28 MED ORDER — LANTUS SOLOSTAR 100 UNIT/ML ~~LOC~~ SOPN: 5.0000 [IU] | PEN_INJECTOR | Freq: Every day | SUBCUTANEOUS | 99 refills | Status: DC

## 2021-03-28 MED ORDER — BLOOD GLUCOSE METER KIT: PACK | 0 refills | Status: DC

## 2021-03-28 NOTE — Patient Instructions (Addendum)
It was great seeing you today! ? ?Plan discussed at today's visit: ?-Start 5 units long-acting insulin in the morning ?-Start checking blood sugar fasting (in the morning prior to eating) and 2 hours after largest meal ?-Write these down and bring to your next appointment  ?-Call me with sugars in 2 weeks  ? ?Follow up in: 1 month ? ?Take care and let us know if you have any questions or concerns prior to your next visit. ? ?Dr. Rosana Berger ? ?

## 2021-03-28 NOTE — Assessment & Plan Note (Signed)
Unfortunately Metformin was giving her bad GI side effects, we will have to start a low dose insulin. Start Lantus 5 units in the morning. Given glucose meter and supplies to start checking fasting and post-prandial sugars. Follow up in 1 month in person but will send sugars over MyChart in 2 weeks.  ?

## 2021-03-28 NOTE — Progress Notes (Signed)
? ?Established Patient Office Visit ? ?Subjective:  ?Patient ID: Amy Wolfe, female    DOB: Jul 02, 1975  Age: 46 y.o. MRN: [id number] ? ?CC:  ?Chief Complaint  ?Patient presents with  ? Diabetes  ? ? ?HPI ?Amy Wolfe presents for follow up. Godmother accompanying her to visit today.  ? ?Alcohol Induced Pancreatitis: ?-Stopped drinking about 1 year ago ?-Has flares of severe cramping/sharp epigastric pains that radiate to the back on a monthly basis ?-Pain is worse with eating any type of food ?-Following with GI - plan discussed: stop marijuana, tobacco use, PPI increase to BID, autoimmune work up, schedule MRCP and possible EGD with EUS pending MRCP results. MRCP scheduled for 03/31/21 ?-Zofran doesn't help, Creon and PPI increased to BID. Referral to pain management as opioids seem to be the only thing that helps  ? ?Diabetes, new onset: ?-Last A1c 8.8 2/23 ?-Medications: Tried Metformin 500 mg but could not tolerate ?-Checking BG at home:  ?-Fasting home BG: hasn't gotten kit yet but did check random sugar last night was 192 ?-Diet: eating very sporadically due to chronic pancreatitis, trying to decrease fat intake  ?-Exercise: None ?-Eye exam: Due ?-Foot exam: Due ?-Microalbumin: Due ?-Statin: No ?-PNA vaccine: Due ?-Denies symptoms of hypoglycemia, polyuria, polydipsia, numbness extremities, foot ulcers/trauma.  ? ? ?Past Medical History:  ?Diagnosis Date  ? Abdominal pain   ? Anxiety   ? Coronary artery disease   ? Dyspnea   ? GERD (gastroesophageal reflux disease)   ? H/O blood clots   ? Hypertension   ? Liver abscess   ? Pancreatitis 2019  ? PONV (postoperative nausea and vomiting) 11/07/2018  ? Thyroid disease   ? Uterine fibroid   ? ? ?Past Surgical History:  ?Procedure Laterality Date  ? CESAREAN SECTION    ? EMBOLIZATION N/A 06/14/2018  ? Procedure: Uterine Embolization;  Surgeon: Algernon Huxley, MD;  Location: [facility];  Service: Cardiovascular;  Laterality: N/A;  ? laceration  finger Right   ? LAPAROSCOPIC VAGINAL HYSTERECTOMY WITH SALPINGECTOMY Bilateral 10/28/2018  ? Procedure: LAPAROSCOPIC ASSISTED VAGINAL HYSTERECTOMY BILATERAL WITH SALPINGECTOMY;  Surgeon: Rubie Maid, MD;  Location: [facility];  Service: Gynecology;  Laterality: Bilateral;  ? THYROIDECTOMY, PARTIAL    ? VAGINAL HYSTERECTOMY Bilateral 10/28/2018  ? Procedure: HYSTERECTOMY VAGINAL WITH BILATERAL SALPINGECTOMY;  Surgeon: Rubie Maid, MD;  Location: [facility];  Service: Gynecology;  Laterality: Bilateral;  ? ? ?Family History  ?Problem Relation Age of Onset  ? Hypertension Mother   ? Diabetes Mother   ? Brain cancer Father   ? Aneurysm Brother   ? Bone cancer Maternal Grandmother   ? Lung cancer Maternal Grandfather   ? Cancer Paternal Grandmother   ? Cancer Paternal Grandfather   ? ? ?Social History  ? ?Socioeconomic History  ? Marital status: Single  ?  Spouse name: Not on file  ? Number of children: 2  ? Years of education: 71  ? Highest education level: Associate degree: academic program  ?Occupational History  ? Occupation: [employer]  ?Tobacco Use  ? Smoking status: Every Day  ?  Packs/day: 0.50  ?  Years: 35.00  ?  Pack years: 17.50  ?  Types: Cigarettes  ? Smokeless tobacco: Never  ?Vaping Use  ? Vaping Use: Never used  ?Substance and Sexual Activity  ? Alcohol use: Not Currently  ? Drug use: Yes  ?  Types: Marijuana  ?  Comment: daily  ? Sexual activity: Yes  ?  Birth control/protection: Surgical  ?Other Topics Concern  ? Not on file  ?Social History Narrative  ? Not on file  ? ?Social Determinants of Health  ? ?Financial Resource Strain: Not on file  ?Food Insecurity: Not on file  ?Transportation Needs: Not on file  ?Physical Activity: Not on file  ?Stress: Not on file  ?Social Connections: Not on file  ?Intimate Partner Violence: Not on file  ? ? ?Outpatient Medications Prior to Visit  ?Medication Sig Dispense Refill  ? blood glucose meter kit and supplies KIT Dispense based on patient and insurance preference.  Use up to four times daily as directed. 1 each 0  ? lipase/protease/amylase (CREON) 36000 UNITS CPEP capsule Take 1-2 capsules (36,000-72,000 Units total) by mouth See admin instructions. Take 2 capsules (72000u) by mouth three times daily with meals and take 1 capsule (36000u) by mouth twice daily with snacks 240 capsule 1  ? metformin (FORTAMET) 500 MG (OSM) 24 hr tablet Take 1 tablet (500 mg total) by mouth daily with breakfast. 30 tablet 1  ? ondansetron (ZOFRAN-ODT) 4 MG disintegrating tablet Take 1 tablet (4 mg total) by mouth every 8 (eight) hours as needed for nausea or vomiting. 12 tablet 0  ? pantoprazole (PROTONIX) 40 MG tablet Take 1 tablet (40 mg total) by mouth 2 (two) times daily before a meal. 60 tablet 1  ? promethazine (PHENERGAN) 12.5 MG tablet Take 1 tablet (12.5 mg total) by mouth every 8 (eight) hours as needed for nausea or vomiting. 20 tablet 0  ? rivaroxaban (XARELTO) 20 MG TABS tablet Take 1 tablet (20 mg total) by mouth daily with supper. 30 tablet 1  ? ?No facility-administered medications prior to visit.  ? ? ?No Known Allergies ? ?ROS ?Review of Systems  ?Constitutional:  Positive for appetite change, fatigue and unexpected weight change. Negative for chills and fever.  ?Gastrointestinal:  Positive for abdominal pain. Negative for constipation, diarrhea, nausea and vomiting.  ? ?  ?Objective:  ?  ?Physical Exam ?Constitutional:   ?   Appearance: Normal appearance.  ?HENT:  ?   Head: Normocephalic and atraumatic.  ?Eyes:  ?   Conjunctiva/sclera: Conjunctivae normal.  ?Cardiovascular:  ?   Rate and Rhythm: Normal rate and regular rhythm.  ?Pulmonary:  ?   Effort: Pulmonary effort is normal.  ?   Breath sounds: Normal breath sounds.  ?Musculoskeletal:  ?   Right lower leg: No edema.  ?   Left lower leg: No edema.  ?Skin: ?   General: Skin is warm and dry.  ?Neurological:  ?   General: No focal deficit present.  ?   Mental Status: She is alert. Mental status is at baseline.  ?Psychiatric:      ?   Mood and Affect: Mood normal.     ?   Behavior: Behavior normal.  ? ? ?BP 110/80   Pulse 97   Temp 98 ?F (36.7 ?C)   Resp 16   Ht _0  (1.702 m)   Wt 151 lb (68.5 kg)   LMP 09/30/2018   SpO2 99%   BMI 23.65 kg/m?  ?Wt Readings from Last 3 Encounters:  ?03/28/21 151 lb (68.5 kg)  ?03/08/21 158 lb (71.7 kg)  ?03/03/21 157 lb (71.2 kg)  ? ? ? ?Health Maintenance Due  ?Topic Date Due  ? COVID-19 Vaccine (1) Never done  ? FOOT EXAM  Never done  ? OPHTHALMOLOGY EXAM  Never done  ? URINE MICROALBUMIN  Never done  ?  PAP SMEAR-Modifier  Never done  ? ? ?There are no preventive care reminders to display for this patient. ? ?Lab Results  ?Component Value Date  ? TSH 1.76 03/08/2021  ? ?Lab Results  ?Component Value Date  ? WBC 6.0 03/08/2021  ? HGB 14.6 03/08/2021  ? HCT 44.2 03/08/2021  ? MCV 85.5 03/08/2021  ? PLT 280.0 03/08/2021  ? ?Lab Results  ?Component Value Date  ? NA 135 03/08/2021  ? K 4.1 03/08/2021  ? CO2 30 03/08/2021  ? GLUCOSE 234 (H) 03/08/2021  ? BUN 7 03/08/2021  ? CREATININE 0.75 03/08/2021  ? BILITOT 0.3 03/08/2021  ? ALKPHOS 66 03/08/2021  ? AST 11 03/08/2021  ? ALT 8 03/08/2021  ? PROT 6.9 03/08/2021  ? ALBUMIN 3.9 03/08/2021  ? CALCIUM 9.4 03/08/2021  ? ANIONGAP 8 02/21/2021  ? GFR 97.61 03/08/2021  ? ?No results found for: CHOL ?No results found for: HDL ?No results found for: [place] ?Lab Results  ?Component Value Date  ? TRIG 127.0 10/05/2020  ? ?No results found for: CHOLHDL ?Lab Results  ?Component Value Date  ? HGBA1C 8.8 (H) 02/24/2021  ? ? ?  ?Assessment & Plan:  ? ?Problem List Items Addressed This Visit   ? ?  ? Digestive  ? Pancreatitis, chronic ([place])  ?  Planning for MRCP later this week, following with GI. Pain management referral again placed. PPI increased to BID dosing, continue low fat diet and Creon.  ?  ?  ? Relevant Orders  ? Ambulatory referral to Pain Clinic  ?  ? Endocrine  ? Diabetes mellitus due to underlying condition with hyperglycemia, without long-term current  use of insulin ([place]) - Primary  ?  Unfortunately Metformin was giving her bad GI side effects, we will have to start a low dose insulin. Start Lantus 5 units in the morning. Given glucose meter and supplies

## 2021-03-28 NOTE — Assessment & Plan Note (Signed)
Planning for MRCP later this week, following with GI. Pain management referral again placed. PPI increased to BID dosing, continue low fat diet and Creon.  ?

## 2021-03-29 ENCOUNTER — Encounter: Payer: Self-pay | Admitting: Internal Medicine

## 2021-03-29 NOTE — Progress Notes (Signed)
Reviewed and agree with documentation and assessment and plan. K. Veena Zaylin Pistilli , MD   

## 2021-03-31 ENCOUNTER — Ambulatory Visit (HOSPITAL_COMMUNITY)
Admission: RE | Admit: 2021-03-31 | Discharge: 2021-03-31 | Disposition: A | Discharge status: Home / Self Care | Payer: Medicaid Other | Source: Ambulatory Visit | Attending: Physician Assistant | Admitting: Physician Assistant

## 2021-03-31 DIAGNOSIS — K859 Acute pancreatitis without necrosis or infection, unspecified: Secondary | ICD-10-CM | POA: Diagnosis not present

## 2021-03-31 DIAGNOSIS — K861 Other chronic pancreatitis: Secondary | ICD-10-CM | POA: Insufficient documentation

## 2021-03-31 MED ORDER — GADOBUTROL 1 MMOL/ML IV SOLN
7.0000 mL | Freq: Once | INTRAVENOUS | Status: AC | PRN
  Administered 2021-03-31: 7 mL via INTRAVENOUS

## 2021-04-01 ENCOUNTER — Encounter: Payer: Self-pay | Admitting: Internal Medicine

## 2021-04-01 ENCOUNTER — Other Ambulatory Visit: Payer: Self-pay

## 2021-04-01 DIAGNOSIS — K860 Alcohol-induced chronic pancreatitis: Secondary | ICD-10-CM

## 2021-04-04 ENCOUNTER — Telehealth: Payer: Self-pay | Admitting: Physician Assistant

## 2021-04-04 NOTE — Telephone Encounter (Signed)
Patient advised that PA has not reviewed MRCP results yet, but once she has done so she will be hearing from [place] regarding results and next steps. ?

## 2021-04-04 NOTE — Telephone Encounter (Signed)
Patient called stating she was wanting the results of her recent testing and to know when her procedure was going to be scheduled.  Please call and advise.  Thank you. ?

## 2021-04-05 ENCOUNTER — Telehealth: Payer: Self-pay | Admitting: Internal Medicine

## 2021-04-05 ENCOUNTER — Ambulatory Visit: Payer: Medicaid Other | Admitting: Internal Medicine

## 2021-04-05 DIAGNOSIS — E0865 Diabetes mellitus due to underlying condition with hyperglycemia: Secondary | ICD-10-CM

## 2021-04-05 MED ORDER — LEVEMIR FLEXPEN 100 UNIT/ML ~~LOC~~ SOPN: 5.0000 [IU] | PEN_INJECTOR | Freq: Every day | SUBCUTANEOUS | 1 refills | Status: DC

## 2021-04-05 NOTE — Telephone Encounter (Signed)
Levemir flexpen sent to pharmacy due to coverage issues, Lantus discontinued.  ?

## 2021-04-05 NOTE — Telephone Encounter (Signed)
Left detailed vm °

## 2021-04-06 ENCOUNTER — Other Ambulatory Visit: Payer: Self-pay

## 2021-04-06 DIAGNOSIS — K859 Acute pancreatitis without necrosis or infection, unspecified: Secondary | ICD-10-CM

## 2021-04-06 DIAGNOSIS — K8080 Other cholelithiasis without obstruction: Secondary | ICD-10-CM

## 2021-04-06 DIAGNOSIS — K863 Pseudocyst of pancreas: Secondary | ICD-10-CM

## 2021-04-07 ENCOUNTER — Other Ambulatory Visit: Payer: Self-pay

## 2021-04-11 ENCOUNTER — Telehealth: Payer: Self-pay

## 2021-04-11 DIAGNOSIS — E0865 Diabetes mellitus due to underlying condition with hyperglycemia: Secondary | ICD-10-CM

## 2021-04-11 MED ORDER — PEN NEEDLES 30G X 8 MM MISC: 1.0000 | Freq: Every day | 3 refills | Status: DC

## 2021-04-11 NOTE — Telephone Encounter (Signed)
Pt has not heard anything from vascular referral? ?

## 2021-04-11 NOTE — Telephone Encounter (Signed)
Pt needs rx needles for insulin pen ?

## 2021-04-12 ENCOUNTER — Telehealth (INDEPENDENT_AMBULATORY_CARE_PROVIDER_SITE_OTHER): Payer: Self-pay | Admitting: Nurse Practitioner

## 2021-04-12 NOTE — Telephone Encounter (Signed)
error 

## 2021-04-13 ENCOUNTER — Telehealth: Payer: Self-pay

## 2021-04-13 NOTE — Telephone Encounter (Signed)
Copied from [place] [phone]. Topic: General - Other ?>> Apr 13, 2021  1:25 PM Antonieta Iba C wrote: ?Reason for CRM: Caryl Pina with [facility] called in to update provider. They are unable to see pt due to her insurance. They are out of network .  ? ?Please assist pt further. ?

## 2021-04-19 ENCOUNTER — Ambulatory Visit (INDEPENDENT_AMBULATORY_CARE_PROVIDER_SITE_OTHER): Payer: Medicaid Other | Admitting: Nurse Practitioner

## 2021-04-19 NOTE — Telephone Encounter (Signed)
Have u tried [facility]? ?

## 2021-04-21 ENCOUNTER — Ambulatory Visit: Payer: Medicaid Other | Admitting: Internal Medicine

## 2021-05-05 ENCOUNTER — Telehealth: Payer: Self-pay

## 2021-05-05 DIAGNOSIS — K860 Alcohol-induced chronic pancreatitis: Secondary | ICD-10-CM

## 2021-05-05 DIAGNOSIS — K863 Pseudocyst of pancreas: Secondary | ICD-10-CM

## 2021-05-05 NOTE — Telephone Encounter (Signed)
Ca we try to refer her to [facility] or [facility] pain management? ?

## 2021-05-19 ENCOUNTER — Telehealth: Payer: Self-pay

## 2021-05-19 NOTE — Telephone Encounter (Signed)
Amy Medici, DO  [name], RN She can hold the Xarelto 2 days prior to the procedure. Will defer to GI for when to resume if there is bleeding. If there are no complications or bleeding, can resume later in the day of the procedure.

## 2021-05-19 NOTE — Telephone Encounter (Signed)
-----   Message from Timothy Lasso, RN sent at 04/07/2021  9:52 AM EDT ----- Make sure the pt has xarelto hold for 5/31 EUS

## 2021-05-19 NOTE — Telephone Encounter (Signed)
The pt has been advised to stop xarelto 2 days prior to her upcoming procedure.  She states that she missed her appt with her PCP and "just stopped it"  I have advised her to call her PCP and reschedule her appt and discuss with PCP.  She was told that she is only to hold xarelto 2 days for the procedure. The pt has been advised of the information and verbalized understanding.

## 2021-05-20 ENCOUNTER — Encounter (HOSPITAL_COMMUNITY): Payer: Self-pay | Admitting: Gastroenterology

## 2021-05-24 ENCOUNTER — Other Ambulatory Visit: Payer: Self-pay | Admitting: Internal Medicine

## 2021-05-25 NOTE — Telephone Encounter (Signed)
The requested diabetic supplies are not on pt's active med list.  No  information listed  Routing to office for review.

## 2021-06-01 ENCOUNTER — Ambulatory Visit (HOSPITAL_COMMUNITY)
Admission: RE | Admit: 2021-06-01 | Discharge: 2021-06-01 | Disposition: A | Discharge status: Home / Self Care | Payer: Medicaid Other | Attending: Gastroenterology | Admitting: Gastroenterology

## 2021-06-01 ENCOUNTER — Ambulatory Visit (HOSPITAL_BASED_OUTPATIENT_CLINIC_OR_DEPARTMENT_OTHER): Payer: Medicaid Other | Admitting: Anesthesiology

## 2021-06-01 ENCOUNTER — Ambulatory Visit (HOSPITAL_COMMUNITY): Payer: Medicaid Other | Admitting: Anesthesiology

## 2021-06-01 ENCOUNTER — Encounter (HOSPITAL_COMMUNITY): Payer: Self-pay | Admitting: Gastroenterology

## 2021-06-01 ENCOUNTER — Encounter (HOSPITAL_COMMUNITY)
Admission: RE | Disposition: A | Discharge status: Home / Self Care | Payer: Self-pay | Source: Home / Self Care | Attending: Gastroenterology

## 2021-06-01 ENCOUNTER — Other Ambulatory Visit: Payer: Self-pay

## 2021-06-01 ENCOUNTER — Ambulatory Visit (HOSPITAL_COMMUNITY): Payer: Medicaid Other

## 2021-06-01 DIAGNOSIS — F17210 Nicotine dependence, cigarettes, uncomplicated: Secondary | ICD-10-CM | POA: Insufficient documentation

## 2021-06-01 DIAGNOSIS — R112 Nausea with vomiting, unspecified: Secondary | ICD-10-CM | POA: Diagnosis not present

## 2021-06-01 DIAGNOSIS — Z6822 Body mass index (BMI) 22.0-22.9, adult: Secondary | ICD-10-CM | POA: Insufficient documentation

## 2021-06-01 DIAGNOSIS — K861 Other chronic pancreatitis: Secondary | ICD-10-CM

## 2021-06-01 DIAGNOSIS — K3189 Other diseases of stomach and duodenum: Secondary | ICD-10-CM | POA: Diagnosis not present

## 2021-06-01 DIAGNOSIS — K862 Cyst of pancreas: Secondary | ICD-10-CM | POA: Insufficient documentation

## 2021-06-01 DIAGNOSIS — I10 Essential (primary) hypertension: Secondary | ICD-10-CM | POA: Insufficient documentation

## 2021-06-01 DIAGNOSIS — K8689 Other specified diseases of pancreas: Secondary | ICD-10-CM | POA: Insufficient documentation

## 2021-06-01 DIAGNOSIS — K859 Acute pancreatitis without necrosis or infection, unspecified: Secondary | ICD-10-CM

## 2021-06-01 DIAGNOSIS — R634 Abnormal weight loss: Secondary | ICD-10-CM | POA: Diagnosis not present

## 2021-06-01 DIAGNOSIS — K863 Pseudocyst of pancreas: Secondary | ICD-10-CM

## 2021-06-01 DIAGNOSIS — K2289 Other specified disease of esophagus: Secondary | ICD-10-CM | POA: Diagnosis not present

## 2021-06-01 DIAGNOSIS — K8080 Other cholelithiasis without obstruction: Secondary | ICD-10-CM

## 2021-06-01 DIAGNOSIS — K838 Other specified diseases of biliary tract: Secondary | ICD-10-CM | POA: Insufficient documentation

## 2021-06-01 DIAGNOSIS — R1011 Right upper quadrant pain: Secondary | ICD-10-CM | POA: Diagnosis not present

## 2021-06-01 DIAGNOSIS — E119 Type 2 diabetes mellitus without complications: Secondary | ICD-10-CM | POA: Diagnosis not present

## 2021-06-01 HISTORY — DX: Type 2 diabetes mellitus without complications: E11.9

## 2021-06-01 HISTORY — PX: EUS: SHX5427

## 2021-06-01 HISTORY — PX: ESOPHAGOGASTRODUODENOSCOPY (EGD) WITH PROPOFOL: SHX5813

## 2021-06-01 HISTORY — PX: BIOPSY: SHX5522

## 2021-06-01 LAB — GLUCOSE, CAPILLARY: Glucose-Capillary: 159 mg/dL — ABNORMAL HIGH (ref 70–99)

## 2021-06-01 SURGERY — ESOPHAGOGASTRODUODENOSCOPY (EGD) WITH PROPOFOL
Anesthesia: Monitor Anesthesia Care

## 2021-06-01 MED ORDER — MIDAZOLAM HCL 2 MG/2ML IJ SOLN
INTRAMUSCULAR | Status: DC | PRN
  Administered 2021-06-01: 2 mg via INTRAVENOUS

## 2021-06-01 MED ORDER — OXYCODONE-ACETAMINOPHEN 5-325 MG PO TABS: 1.0000 | ORAL_TABLET | Freq: Four times a day (QID) | ORAL | 0 refills | Status: DC | PRN

## 2021-06-01 MED ORDER — FENTANYL CITRATE (PF) 100 MCG/2ML IJ SOLN
25.0000 ug | Freq: Once | INTRAMUSCULAR | Status: AC
  Administered 2021-06-01: 25 ug via INTRAVENOUS

## 2021-06-01 MED ORDER — FENTANYL CITRATE (PF) 100 MCG/2ML IJ SOLN
25.0000 ug | Freq: Once | INTRAMUSCULAR | Status: DC
  Administered 2021-06-01: 25 ug via INTRAVENOUS

## 2021-06-01 MED ORDER — FENTANYL CITRATE (PF) 100 MCG/2ML IJ SOLN
INTRAMUSCULAR | Status: AC
  Filled 2021-06-01: qty 2

## 2021-06-01 MED ORDER — AMISULPRIDE (ANTIEMETIC) 5 MG/2ML IV SOLN
10.0000 mg | Freq: Once | INTRAVENOUS | Status: AC
  Administered 2021-06-01: 10 mg via INTRAVENOUS
  Filled 2021-06-01: qty 4

## 2021-06-01 MED ORDER — ONDANSETRON HCL 4 MG/2ML IJ SOLN
INTRAMUSCULAR | Status: DC | PRN
  Administered 2021-06-01: 4 mg via INTRAVENOUS

## 2021-06-01 MED ORDER — PROPOFOL 10 MG/ML IV BOLUS
INTRAVENOUS | Status: DC | PRN
Start: 2021-06-01 — End: 2021-06-01
  Administered 2021-06-01 (×5): 20 mg via INTRAVENOUS
  Administered 2021-06-01: 50 mg via INTRAVENOUS
  Administered 2021-06-01: 40 mg via INTRAVENOUS
  Administered 2021-06-01 (×2): 20 mg via INTRAVENOUS
  Administered 2021-06-01: 30 mg via INTRAVENOUS
  Administered 2021-06-01: 40 mg via INTRAVENOUS
  Administered 2021-06-01: 20 mg via INTRAVENOUS
  Administered 2021-06-01: 40 mg via INTRAVENOUS

## 2021-06-01 MED ORDER — LACTATED RINGERS IV SOLN
INTRAVENOUS | Status: DC
Start: 2021-06-01 — End: 2021-06-01

## 2021-06-01 MED ORDER — HYDROMORPHONE HCL 1 MG/ML IJ SOLN
1.0000 mg | INTRAMUSCULAR | Status: AC
  Administered 2021-06-01: 1 mg via INTRAVENOUS

## 2021-06-01 MED ORDER — HYDROMORPHONE HCL 1 MG/ML IJ SOLN
INTRAMUSCULAR | Status: AC
  Filled 2021-06-01: qty 1

## 2021-06-01 MED ORDER — HYDROMORPHONE HCL 1 MG/ML IJ SOLN
1.0000 mg | Freq: Once | INTRAMUSCULAR | Status: AC
  Administered 2021-06-01: 1 mg via INTRAVENOUS

## 2021-06-01 MED ORDER — SODIUM CHLORIDE 0.9 % IV SOLN: INTRAVENOUS | Status: DC

## 2021-06-01 MED ORDER — PROPOFOL 1000 MG/100ML IV EMUL
INTRAVENOUS | Status: AC
  Filled 2021-06-01: qty 100

## 2021-06-01 MED ORDER — MIDAZOLAM HCL 2 MG/2ML IJ SOLN
INTRAMUSCULAR | Status: AC
  Filled 2021-06-01: qty 2

## 2021-06-01 MED ORDER — AMISULPRIDE (ANTIEMETIC) 5 MG/2ML IV SOLN
INTRAVENOUS | Status: AC
  Filled 2021-06-01: qty 4

## 2021-06-01 SURGICAL SUPPLY — 15 items

## 2021-06-01 NOTE — Progress Notes (Signed)
Pt received from CRNA and procedure nurse with report.  Pt sleeping and hooked up to monitoring equipment.  Pts VSS.  At approximately 0858 the pt began to wake up from sedation and began screaming that she was in extreme pain, began to vomit and was writhing in pain.  Attempted to calm the pt down and she was screaming to leave her alone and "give me something for pain".  The charge nurse obtained an order for Amisulpride IV, which was given at 0910.  This was not effective and the pt continued to vomit brown liquid.  No visible blood was noted.    The pt continue to vomit and cry out in pain, MD was notified and he gave a verbal order for Fentanyl 25 mcg IV once.  This was given at 0924.  Pt continued to cry out in pain and stated we weren't doing anything for her pain.  She was told that Fentanyl was given and she stated that Fentanyl doesn't work.  MD was informed that the Fentanyl was ineffective and a 2nd order for Fentanyl was given.  An xray was also ordered stat.  2nd dose of Fentanyl was given at 0941 and was also ineffective.  Pt was screaming and swinging at staff.  Her mother was brought back to the recovery bay in an attempt to calm her down.  Pt refused xray and was screaming at her mother as well.  VSS were stable with increased BP.  Vomiting continued.  A 3rd dose of Fentanyl was attempted at 0953.  MD informed that the 3 doses of Fentanyl were ineffective and pt was yelling  and swinging at staff.  MD ordered dilaudid '1mg'$  IV and this was administered at 1008.  With this dose the pt was sedated and the xrays were obtained and vomiting ceased.    Pt's linens were messed with vomitus, however she would not allow the staff to change linens.  Soiled areas covered with towels.  At 1110, the pt woke up in pain again and MD ordered 2nd dose of dilaudid which was given at 1116.  Pt is currently sleeping, does not appear to be in pt.  VSS.  Care turned over to [facility].

## 2021-06-01 NOTE — Transfer of Care (Signed)
Immediate Anesthesia Transfer of Care Note  Patient: Vaidehi Chevon Olexa  Procedure(s) Performed: ESOPHAGOGASTRODUODENOSCOPY (EGD) WITH PROPOFOL UPPER ENDOSCOPIC ULTRASOUND (EUS) RADIAL BIOPSY  Patient Location: PACU  Anesthesia Type:MAC  Level of Consciousness: awake, drowsy and patient cooperative  Airway & Oxygen Therapy: Patient Spontanous Breathing  Post-op Assessment: Report given to RN, Post -op Vital signs reviewed and stable and Patient moving all extremities X 4  Post vital signs: Reviewed and stable  Last Vitals:  Vitals Value Taken Time  BP 117/63 06/01/21 0855  Temp 36.6 C 06/01/21 0853  Pulse 78 06/01/21 0854  Resp 26 06/01/21 0856  SpO2 97 % 06/01/21 0854  Vitals shown include unvalidated device data.  Last Pain:  Vitals:   06/01/21 0853  TempSrc: Temporal  PainSc:          Complications: No notable events documented.

## 2021-06-01 NOTE — H&P (Signed)
GASTROENTEROLOGY PROCEDURE H&P NOTE   Primary Care Physician: Teodora Medici, DO  HPI: Amy Wolfe is a 46 y.o. female who presents for EGD/EUS to evaluate pancreas in setting of chronic pancreatitis and pancreatic duct dilation, PD stones, with chronic abdominal pain.  Past Medical History:  Diagnosis Date   Abdominal pain    Anxiety    Coronary artery disease    Diabetes mellitus without complication (HCC)    Dyspnea    GERD (gastroesophageal reflux disease)    H/O blood clots    Hypertension    Liver abscess    Pancreatitis 2019   PONV (postoperative nausea and vomiting) 11/07/2018   Thyroid disease    Uterine fibroid    Past Surgical History:  Procedure Laterality Date   CESAREAN SECTION     EMBOLIZATION N/A 06/14/2018   Procedure: Uterine Embolization;  Surgeon: Algernon Huxley, MD;  Location: [facility];  Service: Cardiovascular;  Laterality: N/A;   laceration finger Right    LAPAROSCOPIC VAGINAL HYSTERECTOMY WITH SALPINGECTOMY Bilateral 10/28/2018   Procedure: LAPAROSCOPIC ASSISTED VAGINAL HYSTERECTOMY BILATERAL WITH SALPINGECTOMY;  Surgeon: Rubie Maid, MD;  Location: [facility];  Service: Gynecology;  Laterality: Bilateral;   THYROIDECTOMY, PARTIAL     VAGINAL HYSTERECTOMY Bilateral 10/28/2018   Procedure: HYSTERECTOMY VAGINAL WITH BILATERAL SALPINGECTOMY;  Surgeon: Rubie Maid, MD;  Location: [facility];  Service: Gynecology;  Laterality: Bilateral;   Current Facility-Administered Medications  Medication Dose Route Frequency Provider Last Rate Last Admin   0.9 %  sodium chloride infusion   Intravenous Continuous Mansouraty, Telford Nab., MD       lactated ringers infusion   Intravenous Continuous Mansouraty, Telford Nab., MD 10 mL/hr at 06/01/21 0714 New Bag at 06/01/21 0714    Current Facility-Administered Medications:    0.9 %  sodium chloride infusion, , Intravenous, Continuous, Mansouraty, Telford Nab., MD   lactated ringers infusion, ,  Intravenous, Continuous, Mansouraty, Telford Nab., MD, Last Rate: 10 mL/hr at 06/01/21 0714, New Bag at 06/01/21 0714 No Known Allergies Family History  Problem Relation Age of Onset   Hypertension Mother    Diabetes Mother    Brain cancer Father    Aneurysm Brother    Bone cancer Maternal Grandmother    Lung cancer Maternal Grandfather    Cancer Paternal Grandmother    Cancer Paternal Grandfather    Social History   Socioeconomic History   Marital status: Single    Spouse name: Not on file   Number of children: 2   Years of education: 12   Highest education level: Associate degree: academic program  Occupational History   Occupation: [employer]  Tobacco Use   Smoking status: Every Day    Packs/day: 0.50    Years: 35.00    Pack years: 17.50    Types: Cigarettes   Smokeless tobacco: Never  Vaping Use   Vaping Use: Never used  Substance and Sexual Activity   Alcohol use: Not Currently   Drug use: Yes    Types: Marijuana    Comment: daily   Sexual activity: Yes    Birth control/protection: Surgical  Other Topics Concern   Not on file  Social History Narrative   Not on file   Social Determinants of Health   Financial Resource Strain: Not on file  Food Insecurity: Not on file  Transportation Needs: Not on file  Physical Activity: Not on file  Stress: Not on file  Social Connections: Not on file  Intimate Partner Violence: Not  on file    Physical Exam: Today's Vitals   06/01/21 0653  BP: (!) 152/98  Pulse: 76  Resp: 10  Temp: 97.9 F (36.6 C)  TempSrc: Temporal  SpO2: 100%  Weight: 66.2 kg  Height: '5\' 7"'$  (1.702 m)  PainSc: 0-No pain   Body mass index is 22.87 kg/m. GEN: NAD EYE: Sclerae anicteric ENT: MMM CV: Non-tachycardic GI: Soft, TTP throughout 5-7/10 pain currently NEURO:  Alert & Oriented x 3  Lab Results: No results for input(s): WBC, HGB, HCT, PLT in the last 72 hours. BMET No results for input(s): NA, K, CL, CO2, GLUCOSE, BUN, CREATININE,  CALCIUM in the last 72 hours. LFT No results for input(s): PROT, ALBUMIN, AST, ALT, ALKPHOS, BILITOT, BILIDIR, IBILI in the last 72 hours. PT/INR No results for input(s): LABPROT, INR in the last 72 hours.   Impression / Plan: This is a 46 y.o.female who presents for EGD/EUS to evaluate pancreas in setting of chronic pancreatitis and pancreatic duct dilation, PD stones, with chronic abdominal pain.  The risks of an EUS including intestinal perforation, bleeding, infection, aspiration, and medication effects were discussed as was the possibility it may not give a definitive diagnosis if a biopsy is performed.  When a biopsy of the pancreas is done as part of the EUS, there is an additional risk of pancreatitis at the rate of about 1-2%.  It was explained that procedure related pancreatitis is typically mild, although it can be severe and even life threatening, which is why we do not perform random pancreatic biopsies and only biopsy a lesion/area we feel is concerning enough to warrant the risk.   The risks and benefits of endoscopic evaluation/treatment were discussed with the patient and/or family; these include but are not limited to the risk of perforation, infection, bleeding, missed lesions, lack of diagnosis, severe illness requiring hospitalization, as well as anesthesia and sedation related illnesses.  The patient's history has been reviewed, patient examined, no change in status, and deemed stable for procedure.  The patient and/or family is agreeable to proceed.    Amy Britain, MD [facility] Office # [phone]

## 2021-06-01 NOTE — Progress Notes (Addendum)
Patient evaluated in post-procedure area. Patient procedure note in the chart. Patient having significant nausea and vomiting and abdominal discomfort in the right upper quadrant region. No pancreatic biopsies or aspirations were performed. Pretest probability of pancreatitis from our procedure should be 0. Pretest probability of perforation is low but nothing is impossible. Patient has received Zofran as well as Amisulpride to see if that would help with her nausea and vomiting. We will continue lactated Ringer's. We will give her a dose of IV fentanyl. We will order stat chest x-ray and KUB to rule out perforation. We will reevaluate the patient to see where things stand. As documented in the preprocedure note patient had significant discomfort even prior to initiating our procedure today. I will update the patient's family.   Justice Britain, MD [facility] Office # [phone]   9:50 AM addendum  Patient reevaluated in postprocedure area. Mother is in the recovery area as well. She continues to have excruciating pain in the right upper quadrant area.  She describes this pain is similar to what she has had before.  She is asking for a cold shower which we cannot let her do. We have given her a total of 75 mcg of fentanyl. She is deferring on allowing [place] to do x-rays to ensure that there is no evidence of a perforation or postprocedural complication. We will continue to try to work with her. If the pain becomes so severe and we cannot control it then she will need to go to the emergency department and have cross-sectional imaging done urgently. We will reevaluate and see where things stand. We will consider potential low-dose Dilaudid or morphine and if things persist and we cannot get imaging again she will likely need to go to the emergency department.  Justice Britain, MD [facility] Office #  [phone]   1015 Addendum 1 mg Dilaudid administered. Patient will allow [place] to try and get CXR/KUB to rule out perforation. We will re-evaluate the patient and potentially administer further medication as necessary. If pain medication threshold is met, and still having issues and negative imaging, then will likely need to go to ED for further medication titration and CT scan imaging. Will update my Inpatient GI team, in case she ends up needing to come into hospital.   Justice Britain, MD [facility] Office # [phone]  Late morning addendum Patient received another 1 mg of Dilaudid. Her x-rays showed no evidence of perforation.  She was able to rest.  She had no further nausea or vomiting.  She actually looked well and had no additional discomfort.  We discharged the patient after talking with her and her family and she was given 5 days worth of acute pain medication, this was already planned even before we knew that she had pain post procedure.  She knows that we will not be administering her additional narcotic/opioid medications as an outpatient but she has real reasons to have pain and discomfort.  We are going to try to understand why she was not allowed to follow-up with the pain service as she had been referred by her primary gastroenterology team.  Justice Britain, MD [facility] Office # [phone]

## 2021-06-01 NOTE — Anesthesia Preprocedure Evaluation (Signed)
Anesthesia Evaluation  Patient identified by MRN, date of birth, ID band Patient awake    Reviewed: Allergy & Precautions, NPO status , Patient's Chart, lab work & pertinent test results  Airway Mallampati: II  TM Distance: >3 FB Neck ROM: Full    Dental no notable dental hx.    Pulmonary Current Smoker,    Pulmonary exam normal breath sounds clear to auscultation       Cardiovascular hypertension, Normal cardiovascular exam Rhythm:Regular Rate:Normal     Neuro/Psych negative neurological ROS  negative psych ROS   GI/Hepatic negative GI ROS, Neg liver ROS,   Endo/Other  diabetes  Renal/GU negative Renal ROS  negative genitourinary   Musculoskeletal negative musculoskeletal ROS (+)   Abdominal   Peds negative pediatric ROS (+)  Hematology negative hematology ROS (+)   Anesthesia Other Findings   Reproductive/Obstetrics negative OB ROS                             Anesthesia Physical Anesthesia Plan  ASA: 2  Anesthesia Plan: MAC   Post-op Pain Management: Minimal or no pain anticipated   Induction: Intravenous  PONV Risk Score and Plan: 2 and Propofol infusion and Treatment may vary due to age or medical condition  Airway Management Planned: Simple Face Mask  Additional Equipment:   Intra-op Plan:   Post-operative Plan:   Informed Consent: I have reviewed the patients History and Physical, chart, labs and discussed the procedure including the risks, benefits and alternatives for the proposed anesthesia with the patient or authorized representative who has indicated his/her understanding and acceptance.     Dental advisory given  Plan Discussed with: CRNA and Surgeon  Anesthesia Plan Comments:         Anesthesia Quick Evaluation

## 2021-06-01 NOTE — Op Note (Addendum)
[facility] Patient Name: Amy Wolfe Procedure Date: 06/01/2021 MRN: [id number] Attending MD: Justice Britain , MD Date of Birth: [birth year] CSN: [id number] Age: 46 Admit Type: Outpatient Procedure:                Upper EUS Indications:              Dilated pancreatic duct on MRCP, Pancreatic cyst on                            MRI, Chronic pancreatitis, Generalized abdominal                            pain, Nausea, Weight loss Providers:                Justice Britain, MD, Burtis Junes, RN, Luan Moore, Technician Referring MD:             Mauri Pole, MD, Noralyn Pick,                            Dr. Rosana Berger Medicines:                Monitored Anesthesia Care Complications:            No immediate complications. Estimated Blood Loss:     Estimated blood loss was minimal. Procedure:                Pre-Anesthesia Assessment:                           - Prior to the procedure, a History and Physical                            was performed, and patient medications and                            allergies were reviewed. The patient's tolerance of                            previous anesthesia was also reviewed. The risks                            and benefits of the procedure and the sedation                            options and risks were discussed with the patient.                            All questions were answered, and informed consent                            was obtained. Prior Anticoagulants: The patient has  taken no previous anticoagulant or antiplatelet                            agents. ASA Grade Assessment: III - A patient with                            severe systemic disease. After reviewing the risks                            and benefits, the patient was deemed in                            satisfactory condition to undergo the procedure.                            After obtaining informed consent, the endoscope was                            passed under direct vision. Throughout the                            procedure, the patient's blood pressure, pulse, and                            oxygen saturations were monitored continuously. The                            [id number]-[id number] ([id number]) Olympus endoscope was introduced                            through the mouth, and advanced to the second part                            of duodenum. The [id number]-[id number] ([id number]) Olympus                            duodenoscope was introduced through the mouth, and                            advanced to the area of papilla. The [id number]                            ([id number]) Olympus linear ultrasound scope was                            introduced through the mouth, and advanced to the                            duodenum for ultrasound examination from the                            stomach and duodenum. The upper EUS was  accomplished without difficulty. The patient                            tolerated the procedure. Scope In: Scope Out: Findings:      ENDOSCOPIC FINDING: :      No gross lesions were noted in the entire esophagus.      The Z-line was irregular and was found 38 cm from the incisors.      Patchy mildly erythematous mucosa without bleeding was found in the       entire examined stomach. Biopsies were taken with a cold forceps for       histology and Helicobacter pylori testing.      No gross lesions were noted in the duodenal bulb, in the first portion       of the duodenum and in the second portion of the duodenum. Biopsies were       taken with a cold forceps for histology.      The major papilla was congested.      ENDOSONOGRAPHIC FINDING: :      Pancreatic parenchymal abnormalities were noted in the entire pancreas.       These consisted of atrophy, hyperechoic foci with shadowing, lobularity       without honeycombing and  hyperechoic strands.      The pancreatic duct had a mostly dilated endosonographic appearance, had       intraductal stones, had a prominently branched endosonographic       appearance and had a tortuous/ectatic appearance throughout. The       pancreatic head (PD - 5.3 mm), genu of the pancreas (PD - 7.5 mm), body       of the pancreas (PD - 7.3 mm) and tail of the pancreas (PD - 5.6 mm).      Multiple hyperechoic foci suggestive of intraductal stones were found in       the pancreatic head, genu of the pancreas, body of the pancreas and side       branches of the pancreatic duct. The largest stone in the head-genu       region was 10.3 by 9.4 mm in size. Upstream ductal dilation from this       stone was evident as noted above. Another stone in the intraductal area       of the body measured 6 mm by 6 mm in size. Other stones were noted in       the side-branches were felt to be seen in the head and the body and tail       areas.      An anechoic lesion suggestive of a cyst was identified in the pancreatic       head. It is not in obvious communication with the pancreatic duct. The       lesion measured 16 mm by 15 mm in maximal cross-sectional diameter.       There was a single compartment without septae. The outer wall of the       lesion was thin. There was no associated mass. There was no internal       debris within the fluid-filled cavity.      There was mild dilation in the common hepatic duct (8.6 mm). The common       bile duct was 6.8 mm. No choledocholithiasis noted. narrowing as a       result of chronic pancreatitis changes was  felt to be seen in the distal       CBD.      Moderate hyperechoic material consistent with sludge was visualized       endosonographically in the gallbladder.      Endosonographic imaging of the ampulla showed no intramural       (subepithelial) lesion.      Endosonographic imaging in the visualized portion of the liver showed no       mass.       There were numerous venous collaterals present adjacent to the portal       vein, based on endosonographic examination.      No malignant-appearing lymph nodes were visualized in the celiac region       (level 20), peripancreatic region and porta hepatis region.      The celiac region was visualized. Impression:               EGD Impression:                           - No gross lesions in esophagus. Z-line irregular,                            38 cm from the incisors.                           - Erythematous mucosa in the stomach. Biopsied.                           - No gross lesions in the duodenal bulb, in the                            first portion of the duodenum and in the second                            portion of the duodenum. Biopsied.                           - Congested major papilla.                           EUS Impression:                           - Pancreatic parenchymal abnormalities consisting                            of atrophy, hyperechoic foci, lobularity and                            hyperechoic strands were noted in the entire                            pancreas. These are consistent with chronic                            pancreatitis changes.                           -  The pancreatic duct had a dilated endosonographic                            appearance, had intraductal stones, had a                            prominently branched endosonographic appearance                            (with evidence of likely intraductal stones in the                            side branches) and had a tortuous/ectatic                            appearance in the pancreatic head, genu of the                            pancreas, body of the pancreas and tail of the                            pancreas. PD dilation noted as above. Largest stone                            burden in the head/genu region.                           - A cystic lesion was seen in the pancreatic  head -                            unchanged as per prior imaging going back to 2022.                           - There was dilation in the common hepatic duct.                            Hyperechoic material consistent with sludge was                            visualized endosonographically in the gallbladder.                           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                           - No malignant-appearing lymph nodes were                            visualized in the celiac region (level 20),  peripancreatic region and porta hepatis region. Moderate Sedation:      Not Applicable - Patient had care per Anesthesia. Recommendation:           - The patient will be observed post-procedure,                            until all discharge criteria are met.                           - Discharge patient to home when stable.                           - Patient has a contact number available for                            emergencies. The signs and symptoms of potential                            delayed complications were discussed with the                            patient. Return to normal activities tomorrow.                            Written discharge instructions were provided to the                            patient.                           - Low fat diet.                           - Focus on protein intake with protein                            shakes/ensure.                           - Will discuss offering patient an opportunity for                            Pancreatic ERCP here in [place] vs referral to                            [facility]. The degree of pancreatic ductal                            stone burden is high, but if able to traverse the                            significant stone burden in the genu, we may be                            able to  improve some  symptoms or recurrence of                            pancreatitis.                           - Consideration of Celiac Plexus blockade for                            attempt at pain management can be consider (risks                            of hypotension and infection and paralysis will                            need to be understood).                           - Continue Alcohol cessation as you have done.                           - Try to decrease tobacco use as able to further                            decrease injury to the pancreas.                           - Will provide patient with a 5-day course of acute                            pain medications (Percocet 5-325 - 20 tablets/0                            refills). She has not been able unfortunately to                            establish with a chronic pain management specialist                            per her and family report (though I will reach out                            to Dr. Woodward Ku team to see where things stand                            with that). I will not be prescribing any further                            chronic pain medications, as it is not part of my                            usual practice however.                           -  The findings and recommendations were discussed                            with the patient's family.                           - The findings and recommendations were discussed                            with the patient. Procedure Code(s):        --- Professional ---                           [phone], Esophagogastroduodenoscopy, flexible,                            transoral; with endoscopic ultrasound examination                            limited to the esophagus, stomach or duodenum, and                            adjacent structures                           43239, Esophagogastroduodenoscopy, flexible,                            transoral; with  biopsy, single or multiple Diagnosis Code(s):        --- Professional ---                           K22.8, Other specified diseases of esophagus                           K31.89, Other diseases of stomach and duodenum                           K86.9, Disease of pancreas, unspecified                           K86.89, Other specified diseases of pancreas                           R93.3, Abnormal findings on diagnostic imaging of                            other parts of digestive tract                           K86.2, Cyst of pancreas                           K83.8, Other specified diseases of biliary tract                           R93.89, Abnormal findings on diagnostic imaging  of                            other specified body structures                           I89.9, Noninfective disorder of lymphatic vessels                            and lymph nodes, unspecified                           K86.1, Other chronic pancreatitis                           R10.84, Generalized abdominal pain                           R11.0, Nausea                           R63.4, Abnormal weight loss CPT copyright 2019 [employer]. All rights reserved. The codes documented in this report are preliminary and upon coder review may  be revised to meet current compliance requirements. Justice Britain, MD 06/01/2021 9:24:46 AM Number of Addenda: 0

## 2021-06-01 NOTE — Anesthesia Postprocedure Evaluation (Signed)
Anesthesia Post Note  Patient: Amy Wolfe Facilities manager  Procedure(s) Performed: ESOPHAGOGASTRODUODENOSCOPY (EGD) WITH PROPOFOL UPPER ENDOSCOPIC ULTRASOUND (EUS) RADIAL BIOPSY     Patient location during evaluation: PACU Anesthesia Type: MAC Level of consciousness: awake and alert Pain management: pain level controlled Vital Signs Assessment: post-procedure vital signs reviewed and stable Respiratory status: spontaneous breathing, nonlabored ventilation, respiratory function stable and patient connected to nasal cannula oxygen Cardiovascular status: stable and blood pressure returned to baseline Postop Assessment: no apparent nausea or vomiting Anesthetic complications: no   No notable events documented.  Last Vitals:  Vitals:   06/01/21 0920 06/01/21 0935  BP: (!) 186/145 (!) 194/106  Pulse: 85   Resp: 17 17  Temp:    SpO2: 100% 100%    Last Pain:  Vitals:   06/01/21 0935  TempSrc:   PainSc: 10-Worst pain ever                 Amy Wolfe S

## 2021-06-02 LAB — SURGICAL PATHOLOGY

## 2021-06-05 ENCOUNTER — Encounter: Payer: Self-pay | Admitting: Gastroenterology

## 2021-07-07 ENCOUNTER — Ambulatory Visit (INDEPENDENT_AMBULATORY_CARE_PROVIDER_SITE_OTHER): Payer: Medicaid Other | Admitting: Gastroenterology

## 2021-07-07 ENCOUNTER — Telehealth: Payer: Self-pay

## 2021-07-07 ENCOUNTER — Encounter: Payer: Self-pay | Admitting: Gastroenterology

## 2021-07-07 VITALS — BP 110/74 | HR 95 | Ht 67.0 in | Wt 128.0 lb

## 2021-07-07 DIAGNOSIS — K862 Cyst of pancreas: Secondary | ICD-10-CM

## 2021-07-07 DIAGNOSIS — K8689 Other specified diseases of pancreas: Secondary | ICD-10-CM

## 2021-07-07 DIAGNOSIS — K860 Alcohol-induced chronic pancreatitis: Secondary | ICD-10-CM | POA: Diagnosis not present

## 2021-07-07 DIAGNOSIS — R634 Abnormal weight loss: Secondary | ICD-10-CM

## 2021-07-07 DIAGNOSIS — G894 Chronic pain syndrome: Secondary | ICD-10-CM

## 2021-07-07 NOTE — Progress Notes (Signed)
 GASTROENTEROLOGY OUTPATIENT CLINIC VISIT   Primary Care Provider Andrews, Elisabeth, DO [address] [place] [address] [phone]  Referring Provider Dr. Nandigam  Patient Profile: Amy Wolfe is a 46 y.o. 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  The patient presents to the [facility] for an evaluation and management of problem(s) noted below:  Problem List 1. Alcohol-induced chronic pancreatitis (HCC)   2. Pancreatic duct stones   3. Pancreatic duct dilated   4. Unintentional weight loss   5. Chronic pain syndrome   6. Pancreatic cyst     History of Present Illness Please see prior notes for full details of HPI.  Interval History The patient returns to the GI clinic to have further discussions in regards to potential endoscopic therapies available for her chronic pancreatitis.  As outlined in my EUS report, patient has definitive chronic pancreatitis and multiple stones in the pancreatic duct within the neck and head region.  This leads to an upstream dilation.  Overt masses and lesions were not noted at the area of upstream dilation.  The patient unfortunately has had a continued weight loss over the course of the last 3 months she has lost nearly 20 pounds.  She describes being unable to eat or drink anything on a regular basis.  She continues to take her Creon without much effectiveness per her report.  Her bowels are irregular as a result of her not eating.  She denies coffee-ground emesis or hematemesis or melena or hematochezia.  Unfortunately she was not able to become part of a chronic pain service for unclear reasons and so we will try to see what Dr. Nandigam's team can find out about this.  The patient is accompanied by her family member today as a support person  help ensure that I understand how sick patient is.  GI Review of Systems Positive as above Negative for dysphagia, odynophagia  Review of Systems General: Denies fevers/chills Cardiovascular: Denies chest pain Pulmonary: Denies shortness of breath Gastroenterological: See HPI Genitourinary: Denies darkened urine Hematological: Positive for history of easy bruising/bleeding Dermatological: Denies jaundice Psychological: Mood is anxious and scared   Medications Current Outpatient Medications  Medication Sig Dispense Refill   ACCU-CHEK GUIDE test strip USE UP TO 4 TIMES DAILY AS DIRECTED 100 each 0   Accu-Chek Softclix Lancets lancets USE UP TO 4 TIMES DAILY AS DIRECTED 100 each 0   blood glucose meter kit and supplies KIT Dispense based on patient and insurance preference. Use up to four times daily as directed. 1 each 0   blood glucose meter kit and supplies Dispense based on patient and insurance preference. Use up to four times daily as directed. (FOR ICD-10 E10.9, E11.9). 1 each 0   insulin detemir (LEVEMIR FLEXPEN) 100 UNIT/ML FlexPen Inject 5 Units into the skin daily. 15 mL 1   Insulin Pen Needle (PEN NEEDLES) 30G X 8 MM MISC 1 each by Does not apply route daily. 90 each 3   lipase/protease/amylase (CREON) 36000 UNITS CPEP capsule Take 1-2 capsules (36,000-72,000 Units total) by mouth See admin instructions. Take 2 capsules (72000u) by mouth three times daily with meals and take 1 capsule (36000u) by mouth twice daily with snacks 240 capsule 1   Multiple Vitamins-Minerals (MULTIVITAMIN ADULT) CHEW Chew 1 each by mouth daily.     ondansetron (ZOFRAN-ODT) 4 MG disintegrating   tablet Take 1 tablet (4 mg total) by mouth every 8 (eight) hours as needed for nausea or vomiting. 12 tablet 0   pantoprazole (PROTONIX) 40 MG tablet Take 1 tablet (40 mg total) by mouth 2 (two) times daily before a meal. 60 tablet 1   promethazine (PHENERGAN) 12.5 MG tablet Take 1 tablet (12.5 mg total) by mouth  every 8 (eight) hours as needed for nausea or vomiting. 20 tablet 0   rivaroxaban (XARELTO) 20 MG TABS tablet Take 1 tablet (20 mg total) by mouth daily with supper. 30 tablet 1   oxyCODONE-acetaminophen (PERCOCET/ROXICET) 5-325 MG tablet Take 1 tablet by mouth every 6 (six) hours as needed for severe pain. 20 tablet 0   No current facility-administered medications for this visit.    Allergies No Known Allergies  Histories Past Medical History:  Diagnosis Date   Abdominal pain    Anxiety    Coronary artery disease    Diabetes mellitus without complication (HCC)    Dyspnea    GERD (gastroesophageal reflux disease)    H/O blood clots    Hypertension    Liver abscess    Pancreatitis 2019   PONV (postoperative nausea and vomiting) 11/07/2018   Thyroid disease    Uterine fibroid    Past Surgical History:  Procedure Laterality Date   BIOPSY  06/01/2021   Procedure: BIOPSY;  Surgeon: Mansouraty,  Jr., MD;  Location: [facility];  Service: Gastroenterology;;   CESAREAN SECTION     EMBOLIZATION N/A 06/14/2018   Procedure: Uterine Embolization;  Surgeon: Dew, Jason S, MD;  Location: [facility];  Service: Cardiovascular;  Laterality: N/A;   ESOPHAGOGASTRODUODENOSCOPY (EGD) WITH PROPOFOL N/A 06/01/2021   Procedure: ESOPHAGOGASTRODUODENOSCOPY (EGD) WITH PROPOFOL;  Surgeon: Mansouraty,  Jr., MD;  Location: [facility];  Service: Gastroenterology;  Laterality: N/A;   EUS N/A 06/01/2021   Procedure: UPPER ENDOSCOPIC ULTRASOUND (EUS) RADIAL;  Surgeon: Mansouraty,  Jr., MD;  Location: [facility];  Service: Gastroenterology;  Laterality: N/A;   laceration finger Right    LAPAROSCOPIC VAGINAL HYSTERECTOMY WITH SALPINGECTOMY Bilateral 10/28/2018   Procedure: LAPAROSCOPIC ASSISTED VAGINAL HYSTERECTOMY BILATERAL WITH SALPINGECTOMY;  Surgeon: Cherry, Anika, MD;  Location: [facility];  Service: Gynecology;  Laterality: Bilateral;   THYROIDECTOMY, PARTIAL     VAGINAL  HYSTERECTOMY Bilateral 10/28/2018   Procedure: HYSTERECTOMY VAGINAL WITH BILATERAL SALPINGECTOMY;  Surgeon: Cherry, Anika, MD;  Location: [facility];  Service: Gynecology;  Laterality: Bilateral;   Social History   Socioeconomic History   Marital status: Single    Spouse name: Not on file   Number of children: 2   Years of education: 12   Highest education level: Associate degree: academic program  Occupational History   Occupation: [employer]  Tobacco Use   Smoking status: Every Day    Packs/day: 0.50    Years: 35.00    Total pack years: 17.50    Types: Cigarettes   Smokeless tobacco: Never  Vaping Use   Vaping Use: Never used  Substance and Sexual Activity   Alcohol use: Not Currently   Drug use: Yes    Types: Marijuana    Comment: daily   Sexual activity: Yes    Birth control/protection: Surgical  Other Topics Concern   Not on file  Social History Narrative   Not on file   Social Determinants of Health   Financial Resource Strain: Low Risk  (10/04/2018)   Overall Financial Resource Strain (CARDIA)    Difficulty of Paying Living Expenses: Not hard at   all  Food Insecurity: No Food Insecurity (10/04/2018)   Hunger Vital Sign    Worried About Running Out of Food in the Last Year: Never true    Ran Out of Food in the Last Year: Never true  Transportation Needs: No Transportation Needs (10/04/2018)   PRAPARE - Transportation    Lack of Transportation (Medical): No    Lack of Transportation (Non-Medical): No  Physical Activity: Inactive (10/04/2018)   Exercise Vital Sign    Days of Exercise per Week: 0 days    Minutes of Exercise per Session: 0 min  Stress: No Stress Concern Present (10/04/2018)   [employer] - Occupational Stress Questionnaire    Feeling of Stress : Only a little  Social Connections: Unknown (10/04/2018)   Social Connection and Isolation Panel [NHANES]    Frequency of Communication with Friends and Family: More than three times a  week    Frequency of Social Gatherings with Friends and Family: More than three times a week    Attends Religious Services: Never    Active Member of Clubs or Organizations: No    Attends Club or Organization Meetings: Never    Marital Status: Not on file  Intimate Partner Violence: Not At Risk (10/04/2018)   Humiliation, Afraid, Rape, and Kick questionnaire    Fear of Current or Ex-Partner: No    Emotionally Abused: No    Physically Abused: No    Sexually Abused: No   Family History  Problem Relation Age of Onset   Hypertension Mother    Diabetes Mother    Brain cancer Father    Aneurysm Brother    Bone cancer Maternal Grandmother    Lung cancer Maternal Grandfather    Cancer Paternal Grandmother    Cancer Paternal Grandfather    Colon cancer Neg Hx    Esophageal cancer Neg Hx    Inflammatory bowel disease Neg Hx    Liver disease Neg Hx    Pancreatic cancer Neg Hx    Rectal cancer Neg Hx    Stomach cancer Neg Hx    I have reviewed her medical, social, and family history in detail and updated the electronic medical record as necessary.    PHYSICAL EXAMINATION  BP 110/74   Pulse 95   Ht 5' 7" (1.702 m)   Wt 128 lb (58.1 kg)   LMP 09/30/2018   BMI 20.05 kg/m  Wt Readings from Last 3 Encounters:  07/07/21 128 lb (58.1 kg)  06/01/21 146 lb (66.2 kg)  03/28/21 151 lb (68.5 kg)  GEN: NAD, appears older than stated age, appears chronically ill but is nontoxic  PSYCH: Cooperative, without pressured speech EYE: Conjunctivae pale-pink, sclerae anicteric ENT: MMM CV: Nontachycardic RESP: No audible wheezing GI: NABS, soft, diffuse TTP throughout midepigastrium and right upper quadrant with deep palpation, no rebound, volitional guarding is present however  MSK/EXT: Lower extremity edema is not noted SKIN: No jaundice NEURO:  Alert & Oriented x 3, no focal deficits   REVIEW OF DATA  I reviewed the following data at the time of this encounter:  GI Procedures and Studies   5/23 EUS EGD Impression: - No gross lesions in esophagus. Z-line irregular, 38 cm from the incisors. - Erythematous mucosa in the stomach. Biopsied. - No gross lesions in the duodenal bulb, in the first portion of the duodenum and in the second portion of the duodenum. Biopsied. - Congested major papilla. EUS Impression: - Pancreatic parenchymal abnormalities consisting of atrophy, hyperechoic   foci, lobularity and hyperechoic strands were noted in the entire pancreas. These are consistent with chronic pancreatitis changes. - The pancreatic duct had a dilated endosonographic appearance, had intraductal stones, had a prominently branched endosonographic appearance (with evidence of likely intraductal stones in the side branches) and had a tortuous/ectatic appearance in the pancreatic head, genu of the pancreas, body of the pancreas and tail of the pancreas. PD dilation noted as above. Largest stone burden in the head/genu region. - A cystic lesion was seen in the pancreatic head - unchanged as per prior imaging going back to 2022. - There was dilation in the common hepatic duct. Hyperechoic material consistent with sludge was visualized endosonographically in the gallbladder. - Endosonographic findings are suggestive of cavernous transformation of the portal vein. - No malignant-appearing lymph nodes were visualized in the celiac region (level 20), peripancreatic region and porta hepatis region.  Laboratory Studies  Reviewed those in epic  Imaging Studies  IMPRESSION: 1. Signs of chronic pancreatitis with loss of intrinsic T1 pancreatic signal throughout nearly the entire gland and with chronic calcific changes and intraductal calculi some of which are large, nearly 1 cm and are present in the dorsal pancreatic duct with numerous additional calcifications in the parenchyma and smaller ductal calculi. 2. Cystic changes in the head of the pancreas with similar appearance to prior CT  imaging. 3. 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 4. No signs of acute pancreatic inflammation at this time. 5. Mild smooth and tapered narrowing of the common bile duct without substantial biliary duct dilation in the setting of cavernous transformation of the portal vein and changes of chronic pancreatitis. 6. Portosystemic collaterals due to portal venous thrombosis and cavernous transformation associated with gastric varices.   ASSESSMENT  Ms. Aja is a 45 y.o. 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  The patient is seen today for evaluation and management of:  1. Alcohol-induced chronic pancreatitis (HCC)   2. Pancreatic duct stones   3. Pancreatic duct dilated   4. Unintentional weight loss   5. Chronic pain syndrome   6. Pancreatic cyst    The patient is hemodynamically stable.  Clinically however she has definitive evidence of chronic pancreatitis and chronic pancreatic duct stone disease.  Attempt at pancreatic ERCP for stone extraction or stenting in an effort of trying to fix the plumbing and see if that may help this patient regards to her pain and recurrent bouts of pancreatitis is not an unreasonable thing to consider.  With this being said, the burden of stone disease that she has may make this difficult if at all possible endoscopically.  Her anatomy is such that surgical options could be considered if an endoscopic approach at therapy is not successful.  In regards to her chronic pain, unfortunately she has not been able to get connected with a chronic pain management service and we will ask Dr. Nandigam's team to try to find out what is going on with this in an effort of seeing if she needs a referral elsewhere to try and help this patient who has real reasons for chronic pain.  I  am going to give her a short 5-day course of opioid therapy, because I know that she has a real reason to have discomfort and pain and although it is not in my practice to continue opioid therapy on a long-term   basis, she definitely has reasons to have pain.  I hope that she will be able to establish with someone soon.  The risks of the pancreatic ERCP are going to be higher for this individual due to her disease.  She had such a significant issue post EUS (without even an aspiration) in regards to pain that she has a low threshold from a pain perspective.  The risks of an ERCP were discussed at length, including but not limited to the risk of perforation, bleeding, abdominal pain, post-ERCP pancreatitis (while usually mild can be severe and even life threatening).  The risk of pancreatitis for her can be upwards of 10 to 15%.  She accepts this and is willing to move forward with an attempt at pancreatic ERCP.  I am also going to consider doing an endoscopic ultrasound-guided celiac block via EUS in an effort of trying to see if that may be helpful for the patient's chronic pain as well.  We will have between a 25 to 65% chance of some pain management with this.  Unless she has a longstanding improvement I would not offer neurolysis.  The risks of neural block include partial/temporary paralysis, hypotension, allergic reaction to the medications.  The risks of an EUS including intestinal perforation, bleeding, infection, aspiration, and medication effects were discussed as was the possibility it may not give a definitive diagnosis if a biopsy is performed.  When a biopsy of the pancreas is done as part of the EUS, there is an additional risk of pancreatitis at the rate of about 1-2%.  It was explained that procedure related pancreatitis is typically mild, although it can be severe and even life threatening, which is why we do not perform random pancreatic biopsies and only biopsy a lesion/area we feel is concerning  enough to warrant the risk.  The risks and benefits of endoscopic evaluation were discussed with the patient; these include but are not limited to the risk of perforation, infection, bleeding, missed lesions, lack of diagnosis, severe illness requiring hospitalization, as well as anesthesia and sedation related illnesses.  The patient and/or family is agreeable to proceed.    PLAN  Laboratories to be obtained as outlined below in the next few weeks Proceed with scheduling EUS with celiac axis blockade attempt Proceed with scheduling ERCP with pancreatic duct cannulation attempt and potential stenting and stone extraction If we are not successful then would recommend patient have an opportunity to be evaluated at one of the quaternary centers Short course Percocet 5/325 every 6 hours as needed - She and family are aware that I will not be prescribing further narcotics/opioid therapy as it is not a part of my practice We will have Dr. Nandigam's team try to find out what has happened with the chronic pain management referral and see if they can work on helping this patient get seen soon   Orders Placed This Encounter  Procedures   Procedural/ Surgical Case Request: ESOPHAGEAL ENDOSCOPIC ULTRASOUND (EUS) RADIAL, ENDOSCOPIC RETROGRADE CHOLANGIOPANCREATOGRAPHY (ERCP) WITH PROPOFOL   CBC w/Diff   Comp Met (CMET)   Amylase   Lipase   IgG 4    New Prescriptions   No medications on file   Modified Medications   Modified Medication Previous Medication   OXYCODONE-ACETAMINOPHEN (PERCOCET/ROXICET) 5-325 MG TABLET oxyCODONE-acetaminophen (PERCOCET/ROXICET) 5-325 MG tablet      Take 1 tablet by mouth every 6 (six) hours as needed for severe pain.    Take 1 tablet by mouth every 6 (six) hours   as needed for severe pain.    Planned Follow Up No follow-ups on file.   Total Time in Face-to-Face and in Coordination of Care for patient including independent/personal interpretation/review of prior  testing, medical history, examination, medication adjustment, communicating results with the patient directly, and documentation within the EHR is 45 minutes.    Mansouraty, MD [facility] Office # [phone]  

## 2021-07-07 NOTE — Patient Instructions (Signed)
Your provider has requested that you go to the basement level for lab work before leaving today. Press "B" on the elevator. The lab is located at the first door on the left as you exit the elevator.   You have been scheduled for an EUS/ERCP. Please follow written instructions given to you at your visit today. If you use inhalers (even only as needed), please bring them with you on the day of your procedure.   We have sent the following medications to your pharmacy for you to pick up at your convenience: Oxycodone (may pick up tomorrow)  If you are age 55 or older, your body mass index should be between 23-30. Your Body mass index is 20.05 kg/m. If this is out of the aforementioned range listed, please consider follow up with your Primary Care Provider.  If you are age 70 or younger, your body mass index should be between 19-25. Your Body mass index is 20.05 kg/m. If this is out of the aformentioned range listed, please consider follow up with your Primary Care Provider.   ________________________________________________________  The [facility] providers would like to encourage you to use [facility] to communicate with providers for non-urgent requests or questions.  Due to long hold times on the telephone, sending your provider a message by [facility] may be a faster and more efficient way to get a response.  Please allow 48 business hours for a response.  Please remember that this is for non-urgent requests.  _______________________________________________________

## 2021-07-07 NOTE — Telephone Encounter (Signed)
   Amy Wolfe Dec 25, 1975 [id number]  Dear Dr.Andrews :  We have scheduled the above named patient for a(n) endoscopic ultrasound/Ercp  procedure. Our records show that (s)he is on anticoagulation therapy.  Please advise as to whether the patient may come off their therapy of Xarelto 2 days prior to their procedure which is scheduled for 08/22/21.  Please route your response to Keishawn Rajewski,CMA.  Sincerely,    [facility]

## 2021-07-08 ENCOUNTER — Encounter: Payer: Self-pay | Admitting: Gastroenterology

## 2021-07-08 DIAGNOSIS — K862 Cyst of pancreas: Secondary | ICD-10-CM | POA: Insufficient documentation

## 2021-07-08 DIAGNOSIS — G894 Chronic pain syndrome: Secondary | ICD-10-CM | POA: Insufficient documentation

## 2021-07-08 DIAGNOSIS — K8689 Other specified diseases of pancreas: Secondary | ICD-10-CM | POA: Insufficient documentation

## 2021-07-08 DIAGNOSIS — K860 Alcohol-induced chronic pancreatitis: Secondary | ICD-10-CM | POA: Insufficient documentation

## 2021-07-08 DIAGNOSIS — R634 Abnormal weight loss: Secondary | ICD-10-CM | POA: Insufficient documentation

## 2021-07-08 MED ORDER — OXYCODONE-ACETAMINOPHEN 5-325 MG PO TABS: 1.0000 | ORAL_TABLET | Freq: Four times a day (QID) | ORAL | 0 refills | Status: DC | PRN

## 2021-07-12 ENCOUNTER — Other Ambulatory Visit: Payer: Self-pay

## 2021-07-12 DIAGNOSIS — K861 Other chronic pancreatitis: Secondary | ICD-10-CM

## 2021-07-12 DIAGNOSIS — G894 Chronic pain syndrome: Secondary | ICD-10-CM

## 2021-07-13 ENCOUNTER — Other Ambulatory Visit: Payer: Self-pay

## 2021-07-13 DIAGNOSIS — G894 Chronic pain syndrome: Secondary | ICD-10-CM

## 2021-07-13 DIAGNOSIS — K861 Other chronic pancreatitis: Secondary | ICD-10-CM

## 2021-07-14 NOTE — Telephone Encounter (Signed)
2nd attempt to call & left voicemail

## 2021-07-21 NOTE — Telephone Encounter (Signed)
Spoken with patient's mother about holding Xarelto prior to procedure.

## 2021-07-22 ENCOUNTER — Telehealth: Payer: Self-pay

## 2021-07-22 NOTE — Telephone Encounter (Signed)
Spoke with Butch Penny. Referral can be sent through Epic. Advises to send to Dr Consuela Mimes due to the diagnosis chronic pancreatitis. Referral placed as directed. Donna's help is appreciated.

## 2021-07-22 NOTE — Telephone Encounter (Signed)
Follow up on pain management referral.  Called [facility]. Did not get to speak with anyone but I was able to leave a message requesting a call back.

## 2021-07-26 ENCOUNTER — Encounter (HOSPITAL_COMMUNITY): Payer: Self-pay

## 2021-07-26 ENCOUNTER — Emergency Department (HOSPITAL_COMMUNITY): Payer: Medicaid Other

## 2021-07-26 ENCOUNTER — Other Ambulatory Visit: Payer: Self-pay

## 2021-07-26 ENCOUNTER — Inpatient Hospital Stay (HOSPITAL_COMMUNITY)
Admission: EM | Admit: 2021-07-26 | Discharge: 2021-07-30 | DRG: 438 | Disposition: A | Discharge status: Home / Self Care | Payer: Medicaid Other | Attending: Internal Medicine | Admitting: Internal Medicine

## 2021-07-26 DIAGNOSIS — E860 Dehydration: Secondary | ICD-10-CM | POA: Diagnosis present

## 2021-07-26 DIAGNOSIS — E440 Moderate protein-calorie malnutrition: Secondary | ICD-10-CM

## 2021-07-26 DIAGNOSIS — Z1611 Resistance to penicillins: Secondary | ICD-10-CM | POA: Diagnosis present

## 2021-07-26 DIAGNOSIS — Z6820 Body mass index (BMI) 20.0-20.9, adult: Secondary | ICD-10-CM

## 2021-07-26 DIAGNOSIS — F419 Anxiety disorder, unspecified: Secondary | ICD-10-CM | POA: Diagnosis present

## 2021-07-26 DIAGNOSIS — F17210 Nicotine dependence, cigarettes, uncomplicated: Secondary | ICD-10-CM | POA: Diagnosis present

## 2021-07-26 DIAGNOSIS — E43 Unspecified severe protein-calorie malnutrition: Secondary | ICD-10-CM | POA: Diagnosis present

## 2021-07-26 DIAGNOSIS — E0865 Diabetes mellitus due to underlying condition with hyperglycemia: Secondary | ICD-10-CM | POA: Diagnosis present

## 2021-07-26 DIAGNOSIS — Z833 Family history of diabetes mellitus: Secondary | ICD-10-CM

## 2021-07-26 DIAGNOSIS — R112 Nausea with vomiting, unspecified: Secondary | ICD-10-CM

## 2021-07-26 DIAGNOSIS — I2510 Atherosclerotic heart disease of native coronary artery without angina pectoris: Secondary | ICD-10-CM | POA: Diagnosis present

## 2021-07-26 DIAGNOSIS — Z7901 Long term (current) use of anticoagulants: Secondary | ICD-10-CM

## 2021-07-26 DIAGNOSIS — K8689 Other specified diseases of pancreas: Secondary | ICD-10-CM | POA: Diagnosis present

## 2021-07-26 DIAGNOSIS — K219 Gastro-esophageal reflux disease without esophagitis: Secondary | ICD-10-CM | POA: Diagnosis present

## 2021-07-26 DIAGNOSIS — K861 Other chronic pancreatitis: Secondary | ICD-10-CM | POA: Diagnosis not present

## 2021-07-26 DIAGNOSIS — Z720 Tobacco use: Secondary | ICD-10-CM | POA: Diagnosis present

## 2021-07-26 DIAGNOSIS — B961 Klebsiella pneumoniae [K. pneumoniae] as the cause of diseases classified elsewhere: Secondary | ICD-10-CM | POA: Diagnosis present

## 2021-07-26 DIAGNOSIS — G894 Chronic pain syndrome: Secondary | ICD-10-CM | POA: Diagnosis present

## 2021-07-26 DIAGNOSIS — E871 Hypo-osmolality and hyponatremia: Secondary | ICD-10-CM | POA: Diagnosis present

## 2021-07-26 DIAGNOSIS — K859 Acute pancreatitis without necrosis or infection, unspecified: Secondary | ICD-10-CM | POA: Diagnosis present

## 2021-07-26 DIAGNOSIS — I10 Essential (primary) hypertension: Secondary | ICD-10-CM | POA: Diagnosis present

## 2021-07-26 DIAGNOSIS — E890 Postprocedural hypothyroidism: Secondary | ICD-10-CM | POA: Diagnosis present

## 2021-07-26 DIAGNOSIS — E876 Hypokalemia: Secondary | ICD-10-CM | POA: Diagnosis present

## 2021-07-26 DIAGNOSIS — N390 Urinary tract infection, site not specified: Secondary | ICD-10-CM | POA: Diagnosis present

## 2021-07-26 DIAGNOSIS — N179 Acute kidney failure, unspecified: Secondary | ICD-10-CM

## 2021-07-26 DIAGNOSIS — Z794 Long term (current) use of insulin: Secondary | ICD-10-CM

## 2021-07-26 DIAGNOSIS — I81 Portal vein thrombosis: Secondary | ICD-10-CM | POA: Diagnosis present

## 2021-07-26 DIAGNOSIS — K862 Cyst of pancreas: Secondary | ICD-10-CM | POA: Diagnosis present

## 2021-07-26 DIAGNOSIS — Z90710 Acquired absence of both cervix and uterus: Secondary | ICD-10-CM

## 2021-07-26 DIAGNOSIS — Z8249 Family history of ischemic heart disease and other diseases of the circulatory system: Secondary | ICD-10-CM | POA: Diagnosis not present

## 2021-07-26 DIAGNOSIS — N3000 Acute cystitis without hematuria: Secondary | ICD-10-CM | POA: Diagnosis not present

## 2021-07-26 DIAGNOSIS — Z79899 Other long term (current) drug therapy: Secondary | ICD-10-CM

## 2021-07-26 DIAGNOSIS — K860 Alcohol-induced chronic pancreatitis: Secondary | ICD-10-CM

## 2021-07-26 DIAGNOSIS — R1013 Epigastric pain: Secondary | ICD-10-CM | POA: Diagnosis not present

## 2021-07-26 LAB — COMPREHENSIVE METABOLIC PANEL
ALT: 20 U/L (ref 0–44)
AST: 27 U/L (ref 15–41)
Albumin: 4.8 g/dL (ref 3.5–5.0)
Alkaline Phosphatase: 92 U/L (ref 38–126)
Anion gap: 19 — ABNORMAL HIGH (ref 5–15)
BUN: 52 mg/dL — ABNORMAL HIGH (ref 6–20)
CO2: 22 mmol/L (ref 22–32)
Calcium: 11.6 mg/dL — ABNORMAL HIGH (ref 8.9–10.3)
Chloride: 90 mmol/L — ABNORMAL LOW (ref 98–111)
Creatinine, Ser: 2.35 mg/dL — ABNORMAL HIGH (ref 0.44–1.00)
GFR, Estimated: 26 mL/min — ABNORMAL LOW (ref >60)
Glucose, Bld: 281 mg/dL — ABNORMAL HIGH (ref 70–99)
Potassium: 2.9 mmol/L — ABNORMAL LOW (ref 3.5–5.1)
Sodium: 131 mmol/L — ABNORMAL LOW (ref 135–145)
Total Bilirubin: 0.7 mg/dL (ref 0.3–1.2)
Total Protein: 9.4 g/dL — ABNORMAL HIGH (ref 6.5–8.1)

## 2021-07-26 LAB — CBC
HCT: 57.4 % — ABNORMAL HIGH (ref 36.0–46.0)
Hemoglobin: 19.1 g/dL — ABNORMAL HIGH (ref 12.0–15.0)
MCH: 27.6 pg (ref 26.0–34.0)
MCHC: 33.3 g/dL (ref 30.0–36.0)
MCV: 82.9 fL (ref 80.0–100.0)
Platelets: 405 10*3/uL — ABNORMAL HIGH (ref 150–400)
RBC: 6.92 MIL/uL — ABNORMAL HIGH (ref 3.87–5.11)
RDW: 14.3 % (ref 11.5–15.5)
WBC: 6.2 10*3/uL (ref 4.0–10.5)
nRBC: 0 % (ref 0.0–0.2)

## 2021-07-26 LAB — URINALYSIS, ROUTINE W REFLEX MICROSCOPIC
Bilirubin Urine: NEGATIVE
Glucose, UA: NEGATIVE mg/dL
Ketones, ur: 5 mg/dL — AB
Nitrite: POSITIVE — AB
Protein, ur: 30 mg/dL — AB
Specific Gravity, Urine: 1.015 (ref 1.005–1.030)
WBC, UA: >50 WBC/hpf — ABNORMAL HIGH (ref 0–5)
pH: 5 (ref 5.0–8.0)

## 2021-07-26 LAB — GLUCOSE, CAPILLARY: Glucose-Capillary: 178 mg/dL — ABNORMAL HIGH (ref 70–99)

## 2021-07-26 LAB — I-STAT BETA HCG BLOOD, ED (MC, WL, AP ONLY): I-stat hCG, quantitative: <5 m[IU]/mL (ref <5)

## 2021-07-26 LAB — MAGNESIUM: Magnesium: 2.1 mg/dL (ref 1.7–2.4)

## 2021-07-26 LAB — LIPASE, BLOOD: Lipase: 63 U/L — ABNORMAL HIGH (ref 11–51)

## 2021-07-26 LAB — ETHANOL: Alcohol, Ethyl (B): <10 mg/dL (ref <10)

## 2021-07-26 MED ORDER — SODIUM CHLORIDE 0.9 % IV BOLUS
30.0000 mL/kg | Freq: Once | INTRAVENOUS | Status: AC
  Administered 2021-07-26: 1743 mL via INTRAVENOUS

## 2021-07-26 MED ORDER — POTASSIUM CHLORIDE IN NACL 40-0.9 MEQ/L-% IV SOLN
INTRAVENOUS | Status: AC
  Filled 2021-07-26 (×2): qty 1000

## 2021-07-26 MED ORDER — HYDROMORPHONE HCL 1 MG/ML IJ SOLN
1.0000 mg | INTRAMUSCULAR | Status: DC | PRN
  Administered 2021-07-26 – 2021-07-29 (×23): 1 mg via INTRAVENOUS
  Filled 2021-07-26 (×24): qty 1

## 2021-07-26 MED ORDER — SODIUM CHLORIDE 0.9 % IV SOLN
1.0000 g | INTRAVENOUS | Status: DC
  Administered 2021-07-27 – 2021-07-28 (×2): 1 g via INTRAVENOUS
  Filled 2021-07-26 (×3): qty 10

## 2021-07-26 MED ORDER — NICOTINE 21 MG/24HR TD PT24
21.0000 mg | MEDICATED_PATCH | Freq: Every day | TRANSDERMAL | Status: DC
  Filled 2021-07-26 (×2): qty 1

## 2021-07-26 MED ORDER — OXYCODONE-ACETAMINOPHEN 5-325 MG PO TABS
1.0000 | ORAL_TABLET | Freq: Four times a day (QID) | ORAL | Status: DC | PRN
  Administered 2021-07-27 – 2021-07-29 (×3): 1 via ORAL
  Filled 2021-07-26 (×3): qty 1

## 2021-07-26 MED ORDER — INSULIN ASPART 100 UNIT/ML IJ SOLN
0.0000 [IU] | INTRAMUSCULAR | Status: DC
  Administered 2021-07-26: 2 [IU] via SUBCUTANEOUS
  Administered 2021-07-27: 1 [IU] via SUBCUTANEOUS
  Administered 2021-07-27 (×2): 2 [IU] via SUBCUTANEOUS
  Administered 2021-07-28: 1 [IU] via SUBCUTANEOUS
  Administered 2021-07-28: 2 [IU] via SUBCUTANEOUS
  Administered 2021-07-28: 3 [IU] via SUBCUTANEOUS
  Administered 2021-07-28: 2 [IU] via SUBCUTANEOUS
  Administered 2021-07-29: 1 [IU] via SUBCUTANEOUS
  Administered 2021-07-29: 3 [IU] via SUBCUTANEOUS
  Administered 2021-07-29 (×2): 2 [IU] via SUBCUTANEOUS
  Administered 2021-07-29: 1 [IU] via SUBCUTANEOUS
  Administered 2021-07-30: 2 [IU] via SUBCUTANEOUS
  Administered 2021-07-30: 1 [IU] via SUBCUTANEOUS
  Filled 2021-07-26: qty 0.09

## 2021-07-26 MED ORDER — SODIUM CHLORIDE 0.9 % IV SOLN
25.0000 mg | Freq: Once | INTRAVENOUS | Status: AC
  Administered 2021-07-26: 25 mg via INTRAVENOUS
  Filled 2021-07-26: qty 25

## 2021-07-26 MED ORDER — POTASSIUM CHLORIDE 10 MEQ/100ML IV SOLN: 10.0000 meq | Freq: Once | INTRAVENOUS | Status: DC

## 2021-07-26 MED ORDER — SODIUM CHLORIDE 0.9 % IV SOLN
1.0000 g | Freq: Once | INTRAVENOUS | Status: AC
  Administered 2021-07-26: 1 g via INTRAVENOUS
  Filled 2021-07-26: qty 10

## 2021-07-26 MED ORDER — SODIUM CHLORIDE 0.9% FLUSH
3.0000 mL | Freq: Two times a day (BID) | INTRAVENOUS | Status: DC
Start: 2021-07-26 — End: 2021-07-30
  Administered 2021-07-27 – 2021-07-30 (×8): 3 mL via INTRAVENOUS

## 2021-07-26 MED ORDER — ACETAMINOPHEN 650 MG RE SUPP: 650.0000 mg | Freq: Four times a day (QID) | RECTAL | Status: DC | PRN

## 2021-07-26 MED ORDER — ONDANSETRON HCL 4 MG/2ML IJ SOLN
4.0000 mg | Freq: Four times a day (QID) | INTRAMUSCULAR | Status: DC | PRN
  Administered 2021-07-27 – 2021-07-29 (×5): 4 mg via INTRAVENOUS
  Filled 2021-07-26 (×6): qty 2

## 2021-07-26 MED ORDER — ACETAMINOPHEN 500 MG PO TABS: 1000.0000 mg | ORAL_TABLET | Freq: Four times a day (QID) | ORAL | Status: DC | PRN

## 2021-07-26 MED ORDER — HYDROMORPHONE HCL 2 MG/ML IJ SOLN
2.0000 mg | Freq: Once | INTRAMUSCULAR | Status: AC
  Administered 2021-07-26: 2 mg via INTRAVENOUS
  Filled 2021-07-26: qty 1

## 2021-07-26 MED ORDER — RIVAROXABAN 20 MG PO TABS
20.0000 mg | ORAL_TABLET | Freq: Every day | ORAL | Status: DC
  Administered 2021-07-27 – 2021-07-29 (×3): 20 mg via ORAL
  Filled 2021-07-26 (×3): qty 1

## 2021-07-26 MED ORDER — POTASSIUM CHLORIDE 10 MEQ/100ML IV SOLN
10.0000 meq | INTRAVENOUS | Status: AC
  Administered 2021-07-26 – 2021-07-27 (×4): 10 meq via INTRAVENOUS
  Filled 2021-07-26 (×4): qty 100

## 2021-07-26 MED ORDER — PANTOPRAZOLE SODIUM 40 MG PO TBEC
40.0000 mg | DELAYED_RELEASE_TABLET | Freq: Two times a day (BID) | ORAL | Status: DC
  Administered 2021-07-27 – 2021-07-30 (×7): 40 mg via ORAL
  Filled 2021-07-26 (×7): qty 1

## 2021-07-26 MED ORDER — ONDANSETRON HCL 4 MG PO TABS: 4.0000 mg | ORAL_TABLET | Freq: Four times a day (QID) | ORAL | Status: DC | PRN

## 2021-07-26 NOTE — Hospital Course (Addendum)
Amy Wolfe is a 46 y.o. 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  She has known chronic pancreatitis associated pancreatic duct stones with ductal dilatation and pancreatic cyst and was seen by Dr. Shaune Spittle already is planning for pancreatic ERCP and EUS with celiac block on 08/22/2021 with attempts to help manage her uncontrolled chronic pain.  She states that she has been having persistent and worsening epigastric pain which has been ongoing since February and pain radiates to her back and she has frequent nausea vomiting spells and is unable to adequately maintain oral intake.  She states that she lost over 100 pounds in last 5 months due to poor appetite and had decreased bowel movements due to poor oral intake.  In the ED CT of the abdomen pelvis was done and showed findings again compatible chronic pancreatitis including pancreatic head calcification as well as pancreatic ductal dilatation with no definite inflammatory stranding.  She had one pancreatic head cyst that was decreased in size and more inferior in the uncinate process.  She is given IV Dilaudid as 1700 mL normal saline bolus as well as IV Phenergan and given IV ceftriaxone and the hospital service was asked to admit this patient.  GIs been consulted and they are recommending pain control and fluid hydration with eventual EUS and celiac axis block for pain control with PRP with possible pancreatic stone extraction.  They are recommending focus on her nutrition and recommending a nasoenteric tube but the patient wishes to pursue full liquids and high-calorie supplements currently.   Patient's pain seems to be better controlled and she continues to have some nausea but does not really as bad so GI recommending continue to advance diet as possible and focusing on high-protein high caloric liquid shakes. Urine is growing greater than 100,000 colony-forming units of Klebsiella that is resistant to ampicillin and intermediate to nitrofurantoin.  Antibiotics have de-escalated to cefazolin and will change to p.o. in the morning.  Patient's diet was advanced and she is now tolerating soft diet with some mild nausea but still has some abdominal pain.  We will transition her oral medications all oral pain meds and likely discharge in the next 24 hours.

## 2021-07-26 NOTE — Assessment & Plan Note (Signed)
Secondary to volume depletion.  Calcium 11.6 on admission with albumin 4.8. -Continue IV fluids and repeat labs in a.m.

## 2021-07-26 NOTE — ED Triage Notes (Signed)
Pt presents with c/o abdominal pain. Pt reports a hx of pancreatitis and reports she is supposed to have surgery soon but the pain is out of control. Family at bedside report that she has been losing weight and is not able to eat. Pt is writhing in the wheelchair and moaning upon triage assessment.

## 2021-07-26 NOTE — Assessment & Plan Note (Signed)
Mild in setting of volume depletion and hyperglycemia. -Continue IV fluids and repeat labs in a.m.

## 2021-07-26 NOTE — Assessment & Plan Note (Addendum)
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  She says she has remained sober.  Home Percocet not providing significant relief.  Planned for ERCP and EUS celiac block on 8/21 w/ [facility], Dr. Rush Landmark. -Keep n.p.o. -Continue IV fluid hydration with NS'@125'$  mL/hour overnight -Continue IV antiemetics and analgesics as needed

## 2021-07-26 NOTE — ED Provider Notes (Signed)
[facility] DEPT Provider Note   CSN: [id number] Arrival date & time: 07/26/21  1338     History  Chief Complaint  Patient presents with   Abdominal Pain    Amy Wolfe is a 46 y.o. female.  Presents ER chief complaint of exacerbation of her chronic pain.  She has had multiple episodes of pancreatitis and states this feels the same.  She has been having this pain for the past week vomiting throughout the week as well.  Mother states that she is not able to eat anything and has been moaning and writhing around in pain at home until the family brought her into the ER.  Denies any fevers denies any diarrhea.  Complaining of diffuse abdominal pain worse in epigastric region.       Home Medications Prior to Admission medications   Medication Sig Start Date End Date Taking? Authorizing Provider  ACCU-CHEK GUIDE test strip USE UP TO 4 TIMES DAILY AS DIRECTED 05/26/21   Teodora Medici, DO  Accu-Chek Softclix Lancets lancets USE UP TO 4 TIMES DAILY AS DIRECTED 05/26/21   Teodora Medici, DO  blood glucose meter kit and supplies KIT Dispense based on patient and insurance preference. Use up to four times daily as directed. 03/24/21   Teodora Medici, DO  blood glucose meter kit and supplies Dispense based on patient and insurance preference. Use up to four times daily as directed. (FOR ICD-10 E10.9, E11.9). 03/28/21   Teodora Medici, DO  insulin detemir (LEVEMIR FLEXPEN) 100 UNIT/ML FlexPen Inject 5 Units into the skin daily. 04/05/21   Teodora Medici, DO  Insulin Pen Needle (PEN NEEDLES) 30G X 8 MM MISC 1 each by Does not apply route daily. 04/11/21   Teodora Medici, DO  lipase/protease/amylase (CREON) 36000 UNITS CPEP capsule Take 1-2 capsules (36,000-72,000 Units total) by mouth See admin instructions. Take 2 capsules (72000u) by mouth three times daily with meals and take 1 capsule (36000u) by mouth twice daily with snacks 02/21/21    Merlyn Lot, MD  Multiple Vitamins-Minerals (MULTIVITAMIN ADULT) CHEW Chew 1 each by mouth daily.    [provider]  ondansetron (ZOFRAN-ODT) 4 MG disintegrating tablet Take 1 tablet (4 mg total) by mouth every 8 (eight) hours as needed for nausea or vomiting. 02/21/21   Merlyn Lot, MD  oxyCODONE-acetaminophen (PERCOCET/ROXICET) 5-325 MG tablet Take 1 tablet by mouth every 6 (six) hours as needed for severe pain. 07/08/21   Mansouraty, Telford Nab., MD  pantoprazole (PROTONIX) 40 MG tablet Take 1 tablet (40 mg total) by mouth 2 (two) times daily before a meal. 03/08/21   Vladimir Crofts, PA-C  promethazine (PHENERGAN) 12.5 MG tablet Take 1 tablet (12.5 mg total) by mouth every 8 (eight) hours as needed for nausea or vomiting. 02/24/21   Teodora Medici, DO  rivaroxaban (XARELTO) 20 MG TABS tablet Take 1 tablet (20 mg total) by mouth daily with supper. 02/21/21   Merlyn Lot, MD      Allergies    Patient has no known allergies.    Review of Systems   Review of Systems  Constitutional:  Negative for fever.  HENT:  Negative for ear pain.   Eyes:  Negative for pain.  Respiratory:  Negative for cough.   Cardiovascular:  Negative for chest pain.  Gastrointestinal:  Positive for abdominal pain.  Genitourinary:  Negative for flank pain.  Musculoskeletal:  Negative for back pain.  Skin:  Negative for rash.  Neurological:  Negative for headaches.  Physical Exam Updated Vital Signs BP 129/86 (BP Location: Right Arm)   Pulse 89   Temp 97.8 F (36.6 C) (Oral)   Resp 14   Ht _0  (1.702 m)   Wt 58.1 kg   LMP 09/30/2018   SpO2 100%   BMI 20.05 kg/m  Physical Exam Constitutional:      Appearance: Normal appearance.     Comments: Patient twisting around in bed complaining of pain to the abdomen.  HENT:     Head: Normocephalic.     Nose: Nose normal.  Eyes:     Extraocular Movements: Extraocular movements intact.  Cardiovascular:     Rate and Rhythm: Normal  rate.  Pulmonary:     Effort: Pulmonary effort is normal.  Abdominal:     Comments: Moderate epigastric tenderness on exam.  No guarding or rebound.  Musculoskeletal:        General: Normal range of motion.     Cervical back: Normal range of motion.  Neurological:     General: No focal deficit present.     Mental Status: She is alert. Mental status is at baseline.     ED Results / Procedures / Treatments   Labs (all labs ordered are listed, but only abnormal results are displayed) Labs Reviewed  LIPASE, BLOOD - Abnormal; Notable for the following components:      Result Value   Lipase 63 (*)    All other components within normal limits  COMPREHENSIVE METABOLIC PANEL - Abnormal; Notable for the following components:   Sodium 131 (*)    Potassium 2.9 (*)    Chloride 90 (*)    Glucose, Bld 281 (*)    BUN 52 (*)    Creatinine, Ser 2.35 (*)    Calcium 11.6 (*)    Total Protein 9.4 (*)    GFR, Estimated 26 (*)    Anion gap 19 (*)    All other components within normal limits  CBC - Abnormal; Notable for the following components:   RBC 6.92 (*)    Hemoglobin 19.1 (*)    HCT 57.4 (*)    Platelets 405 (*)    All other components within normal limits  URINALYSIS, ROUTINE W REFLEX MICROSCOPIC  MAGNESIUM  ETHANOL  I-STAT BETA HCG BLOOD, ED ([facility], [facility], [facility] ONLY)    EKG None  Radiology CT Abdomen Pelvis Wo Contrast  Result Date: 07/26/2021 CLINICAL DATA:  Suspected pancreatitis. Abdominal pain. History of pancreatitis. Patient reports postop surgery soon with the pain is out of control. EXAM: CT ABDOMEN AND PELVIS WITHOUT CONTRAST TECHNIQUE: Multidetector CT imaging of the abdomen and pelvis was performed following the standard protocol without IV contrast. RADIATION DOSE REDUCTION: This exam was performed according to the departmental dose-optimization program which includes automated exposure control, adjustment of the mA and/or kV according to patient size and/or use of iterative  reconstruction technique. COMPARISON:  AP chest 06/01/2021; CT abdomen and pelvis with contrast 02/21/2021 and 09/01/2020 FINDINGS: Lower chest: 4 mm lateral right lower lobe pulmonary nodule is unchanged from 03/30/2019 and benign (axial series 4, image 16). Lack of intravenous contrast limits evaluation of the solid abdominal and pelvic organ parenchyma. The following findings are made within this limitation. Hepatobiliary: Smooth liver contours. No gross liver lesion is seen. The gallbladder is grossly unremarkable. Pancreas: There again numerous calcifications within the pancreatic head and uncinate process likely the sequela of chronic calcific pancreatitis. A fluid attenuation cyst is again seen within the pancreatic head. This measures  up to approximately 1.4 x 1.4 cm (axial series 2, image 32) mildly decreased in size from 16 x 17 mm on 02/21/2021. Within the more inferior pancreas uncinate process there is a fluid density 2.4 x 1.9 cm cyst that is slightly increased in size from 1.3 by 1.5 cm previously (axial series 2, image 35). The main pancreatic duct is again diffusely prominent measuring up to approximately 8 mm. Spleen: Grossly unremarkable. Adrenals/Urinary Tract: Normal adrenals. No renal stone or hydronephrosis. Within limitations of lack of intravenous contrast no contour deforming renal mass is seen. No renal or ureteral stone is seen. There is streak artifact from bilateral metallic coils within the pelvis obscuring portions of the distal ureters and the posterolateral aspect of the urinary bladder. No focal urinary bladder wall thickening is seen. Stomach/Bowel: Mild proximal sigmoid diverticulosis. No inflammatory changes to indicate acute diverticulitis. Normal retrocecal appendix (coronal series 5, image 66). The terminal ileum is unremarkable. No dilated loops of bowel to indicate bowel obstruction. Vascular/Lymphatic: No abdominal aortic aneurysm. No mesenteric retroperitoneal or pelvic  pathologically enlarged lymph nodes by CT criteria. Reproductive: The uterus appears to be surgically absent. Bilateral adnexal postsurgical metallic coils are visualized. The bilateral ovarian tissue is again grossly unremarkable. Other: Small fat containing umbilical hernia. No free air or free fluid is seen within the abdomen or pelvis. Musculoskeletal: Trace grade 1 anterolisthesis of L4 on L5 is unchanged from prior. No acute skeletal abnormality. IMPRESSION: 1. Compared to 02/21/2021, there are findings again compatible with chronic pancreatitis including pancreatic head calcifications and pancreatic ductal dilatation. No definite inflammatory stranding to indicate acute pancreatitis. 2. One pancreatic head cyst is slightly decreased in size now measuring 13 mm in axial image 32. A more inferior uncinate process cyst measures up to 2.3 cm mildly increased from 1.5 cm previously. Electronically Signed   By: Yvonne Kendall M.D.   On: 07/26/2021 18:07    Procedures Procedures    Medications Ordered in ED Medications  HYDROmorphone (DILAUDID) injection 2 mg (2 mg Intravenous Given 07/26/21 1533)  promethazine (PHENERGAN) 25 mg in sodium chloride 0.9 % 50 mL IVPB (25 mg Intravenous New Bag/Given 07/26/21 1535)  sodium chloride 0.9 % bolus 1,743 mL (1,743 mLs Intravenous New Bag/Given 07/26/21 1539)    ED Course/ Medical Decision Making/ A&P                           Medical Decision Making Amount and/or Complexity of Data Reviewed Labs: ordered. Radiology: ordered.  Risk Prescription drug management.   Review of record shows an office visit with gastroenterology July 07, 2021.  At bedside.  Cardiac monitoring showing sinus rhythm.  Work-up includes labs, white count 6 hemoglobin 19 chemistry shows potassium 2.9 bicarb of 22 creatinine of 2.3 anion gap of 19.  Appears to be an acute kidney injury.  Given 1.7 L bolus of fluids, IV Dilaudid and IV Phenergan with significant [place]  symptoms.  Noncontrast CT abdomen pelvis pursued with no new findings other than evidence of her chronic pancreatitis noted.  Given renal insufficiency and hypokalemia, will be admitted to the hospitalist team.        Final Clinical Impression(s) / ED Diagnoses Final diagnoses:  Alcohol-induced chronic pancreatitis ([place])  Acute kidney injury ([place])  Nausea and vomiting, unspecified vomiting type    Rx / DC Orders ED Discharge Orders     None         [place], Greggory Brandy, MD  07/26/21 1823

## 2021-07-26 NOTE — Assessment & Plan Note (Addendum)
Reports ongoing tobacco use.  Nicotine patch provided.

## 2021-07-26 NOTE — Assessment & Plan Note (Addendum)
Hyperglycemic without evidence of DKA/HHS.  Start on SSI q4h while n.p.o. and adjust as needed.

## 2021-07-26 NOTE — H&P (Signed)
History and Physical    [name] NIO:[id number] DOB: [birth year] DOA: 07/26/2021  PCP: Teodora Medici, DO  Patient coming from: Home  I have personally briefly reviewed patient's old medical records in [place]  Chief Complaint: Abdominal pain  HPI: Amy Wolfe is a 46 y.o. 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  Patient with known chronic pancreatitis with associated pancreatic duct stones with ductal dilatation and pancreatic cyst.  Seen by gastroenterology, Dr. Rush Landmark, who is planning for pancreatic ERCP and EUS celiac block on 8/21 and attempt to help manage patient's uncontrolled chronic pain.  Patient states that she has been having persistent and worsening epigastric pain which has been ongoing since February.  Pain radiates straight through to her back.  She has frequent nausea and vomiting and has not been able to maintain any adequate oral intake.  She says she has lost 100 pounds over the last 5 months.  She has decreased bowel movements due to poor oral intake.  She reports dysuria with concentrated urine.  ED Course  Labs/Imaging on admission: I have personally reviewed following labs and imaging studies.  Initial vitals showed BP 131/94, pulse 78, RR 18, temp 97.7 F, SPO2 92% on room air.  Labs show WBC 6.2, hemoglobin 19.1, platelets 405,000, sodium 131, potassium 3.9, chloride 90, bicarb 22, BUN 52, creatinine 2.35 (baseline 0.7-0.8) serum glucose 281, calcium 11.6, albumin 4.8, LFTs within normal limits, lipase 63, i-STAT beta-hCG <5.0.  Urinalysis shows positive nitrites, moderate leukocytes, 6-10 RBC/hpf, >50 WBC/hpf, many bacteria microscopy.  CT  abdomen/pelvis without contrast shows findings again compatible with chronic pancreatitis including pancreatic head calcifications and pancreatic ductal dilatation, no definite inflammatory stranding.  1 pancreatic head cyst is decreased in size, more inferior uncinate process cyst mildly increased in size.  Patient was given IV Dilaudid 2 mg, 1700 mL normal saline bolus, IV Phenergan, IV K 10 mEq x 1, and IV ceftriaxone.  The hospitalist service was consulted to admit for further evaluation and management.  Review of Systems: All systems reviewed and are negative except as documented in history of present illness above.   Past Medical History:  Diagnosis Date   Abdominal pain    Anxiety    Coronary artery disease    Diabetes mellitus without complication (HCC)    Dyspnea    GERD (gastroesophageal reflux disease)    H/O blood clots    Hypertension    Liver abscess    Pancreatitis 2019   PONV (postoperative nausea and vomiting) 11/07/2018   Thyroid disease    Uterine fibroid     Past Surgical History:  Procedure Laterality Date   BIOPSY  06/01/2021   Procedure: BIOPSY;  Surgeon: Irving Copas., MD;  Location: [facility];  Service: Gastroenterology;;   CESAREAN SECTION     EMBOLIZATION N/A 06/14/2018   Procedure: Uterine Embolization;  Surgeon: Algernon Huxley, MD;  Location: [facility];  Service: Cardiovascular;  Laterality: N/A;   ESOPHAGOGASTRODUODENOSCOPY (EGD) WITH PROPOFOL N/A 06/01/2021   Procedure: ESOPHAGOGASTRODUODENOSCOPY (EGD) WITH PROPOFOL;  Surgeon: Rush Landmark Telford Nab., MD;  Location: [facility];  Service: Gastroenterology;  Laterality: N/A;   EUS N/A 06/01/2021   Procedure: UPPER ENDOSCOPIC ULTRASOUND (EUS) RADIAL;  Surgeon: Irving Copas., MD;  Location: [facility];  Service: Gastroenterology;  Laterality: N/A;   laceration finger Right    LAPAROSCOPIC VAGINAL HYSTERECTOMY WITH SALPINGECTOMY Bilateral 10/28/2018   Procedure:  LAPAROSCOPIC ASSISTED VAGINAL HYSTERECTOMY BILATERAL WITH SALPINGECTOMY;  Surgeon: Rubie Maid, MD;  Location: [facility];  Service: Gynecology;  Laterality: Bilateral;   THYROIDECTOMY, PARTIAL     VAGINAL HYSTERECTOMY Bilateral 10/28/2018   Procedure: HYSTERECTOMY VAGINAL WITH BILATERAL SALPINGECTOMY;  Surgeon: Rubie Maid, MD;  Location: [facility];  Service: Gynecology;  Laterality: Bilateral;    Social History:  reports that she has been smoking cigarettes. She has a 17.50 pack-year smoking history. She has never used smokeless tobacco. She reports that she does not currently use alcohol. She reports current drug use. Drug: Marijuana.  No Known Allergies  Family History  Problem Relation Age of Onset   Hypertension Mother    Diabetes Mother    Brain cancer Father    Aneurysm Brother    Bone cancer Maternal Grandmother    Lung cancer Maternal Grandfather    Cancer Paternal Grandmother    Cancer Paternal Grandfather    Colon cancer Neg Hx    Esophageal cancer Neg Hx    Inflammatory bowel disease Neg Hx    Liver disease Neg Hx    Pancreatic cancer Neg Hx    Rectal cancer Neg Hx    Stomach cancer Neg Hx      Prior to Admission medications   Medication Sig Start Date End Date Taking? Authorizing Provider  ACCU-CHEK GUIDE test strip USE UP TO 4 TIMES DAILY AS DIRECTED 05/26/21   Teodora Medici, DO  Accu-Chek Softclix Lancets lancets USE UP TO 4 TIMES DAILY AS DIRECTED 05/26/21   Teodora Medici, DO  blood glucose meter kit and supplies KIT Dispense based on patient and insurance preference. Use up to four times daily as directed. 03/24/21   Teodora Medici, DO  blood glucose meter kit and supplies Dispense based on patient and insurance preference. Use up to four times daily as directed. (FOR ICD-10 E10.9, E11.9). 03/28/21   Teodora Medici, DO  insulin detemir (LEVEMIR FLEXPEN) 100 UNIT/ML FlexPen Inject 5 Units into the skin daily. 04/05/21   Teodora Medici, DO   Insulin Pen Needle (PEN NEEDLES) 30G X 8 MM MISC 1 each by Does not apply route daily. 04/11/21   Teodora Medici, DO  lipase/protease/amylase (CREON) 36000 UNITS CPEP capsule Take 1-2 capsules (36,000-72,000 Units total) by mouth See admin instructions. Take 2 capsules (72000u) by mouth three times daily with meals and take 1 capsule (36000u) by mouth twice daily with snacks 02/21/21   Merlyn Lot, MD  Multiple Vitamins-Minerals (MULTIVITAMIN ADULT) CHEW Chew 1 each by mouth daily.    [provider]  ondansetron (ZOFRAN-ODT) 4 MG disintegrating tablet Take 1 tablet (4 mg total) by mouth every 8 (eight) hours as needed for nausea or vomiting. 02/21/21   Merlyn Lot, MD  oxyCODONE-acetaminophen (PERCOCET/ROXICET) 5-325 MG tablet Take 1 tablet by mouth every 6 (six) hours as needed for severe pain. 07/08/21   Mansouraty, Telford Nab., MD  pantoprazole (PROTONIX) 40 MG tablet Take 1 tablet (40 mg total) by mouth 2 (two) times daily before a meal. 03/08/21   Vladimir Crofts, PA-C  promethazine (PHENERGAN) 12.5 MG tablet Take 1 tablet (12.5 mg total) by mouth every 8 (eight) hours as needed for nausea or vomiting. 02/24/21   Teodora Medici, DO  rivaroxaban (XARELTO) 20 MG TABS tablet Take 1 tablet (20 mg total) by mouth daily with supper. 02/21/21  Merlyn Lot, MD    Physical Exam: Vitals:   07/26/21 1359 07/26/21 1710 07/26/21 1812 07/26/21 2022  BP: (!) 131/94 129/86  (!) 139/91  Pulse: 98 89  (!) 102  Resp: _0 Temp: 97.7 F (36.5 C)  97.8 F (36.6 C) 97.7 F (36.5 C)  TempSrc: Oral  Oral Oral  SpO2: 92% 100%  100%  Weight: 58.1 kg     Height: _1  (1.702 m)      Constitutional: Thin woman, resting in bed, tearful and in distress related to pain Eyes: EOMI, lids and conjunctivae normal ENMT: Mucous membranes are dry. Posterior pharynx clear of any exudate or lesions.Normal dentition.  Neck: normal, supple, no masses. Respiratory: clear to auscultation  bilaterally, no wheezing, no crackles. Normal respiratory effort. No accessory muscle use.  Cardiovascular: Regular rate and rhythm, no murmurs / rubs / gallops. No extremity edema. 2+ pedal pulses. Abdomen: Epigastric tenderness, no masses palpated.  Musculoskeletal: no clubbing / cyanosis. No joint deformity upper and lower extremities. Good ROM, no contractures. Normal muscle tone.  Skin: no rashes, lesions, ulcers. No induration Neurologic: Sensation intact. Strength 5/5 in all 4.  Psychiatric:  Alert and oriented x 3.  EKG: Not performed.  Assessment/Plan Principal Problem:   Acute on chronic pancreatitis (HCC) Active Problems:   AKI (acute kidney injury) ([place])   Hypokalemia   Hypercalcemia   Hyponatremia   Diabetes mellitus due to underlying condition with hyperglycemia, with long-term current use of insulin (HCC)   Portal vein thrombosis   UTI (urinary tract infection)   Tobacco use   Pancreatic duct stones   Amy Wolfe is a 46 y.o. 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  She says she has remained sober.  Home Percocet not providing significant relief.  Planned for ERCP and EUS celiac block on 8/21 w/ [facility], Dr. Rush Landmark. -Keep n.p.o. -Continue IV fluid hydration with NS_2  mL/hour overnight -Continue IV antiemetics and analgesics as needed  AKI  (acute kidney injury) ([place]) Prerenal due to volume depletion from GI losses.  Creatinine 2.35 on admission compared to baseline 0.7-0.8. -Continue IV fluid hydration as above -Repeat labs in a.m. -Avoid NSAIDs  Hyponatremia Mild in setting of volume depletion and hyperglycemia. -Continue IV fluids and repeat labs in a.m.  Hypercalcemia Secondary to volume depletion.  Calcium 11.6 on admission with albumin 4.8. -Continue IV fluids and repeat labs in a.m.  Hypokalemia Secondary to GI losses.  IV supplementation ordered.  Diabetes mellitus due to underlying condition with hyperglycemia, with long-term current use of insulin (HCC) Hyperglycemic without evidence of DKA/HHS.  Start on SSI q4h while n.p.o. and adjust as needed.  UTI (urinary tract infection) Continue IV ceftriaxone.  Add on urine culture.  Portal vein thrombosis Continue Xarelto.  Tobacco use Reports ongoing tobacco use.  Nicotine patch provided.  DVT prophylaxis:  rivaroxaban (XARELTO) tablet 20 mg   Code Status: Full code, confirmed on admission Family Communication: Discussed with patient's mother by phone  Disposition Plan: From home, dispo pending clinical progress Consults called: None Severity of Illness: The appropriate patient status for this patient is INPATIENT. Inpatient status is judged to be reasonable and necessary in order to provide the required intensity of service to ensure the patient's safety. The patient's presenting symptoms, physical exam findings, and initial radiographic and laboratory data in the context of their chronic comorbidities is felt to place them at high risk for further clinical deterioration. Furthermore, it is not anticipated that the patient will be medically stable for discharge from the hospital within 2 midnights of admission.   * I certify that at the point of admission it is my clinical judgment that the patient will require inpatient hospital care spanning beyond 2  midnights from the point of admission due to high intensity of service, high risk for further deterioration and high frequency of surveillance required.Zada Finders MD [facility]  If 7PM-7AM, please contact night-coverage [email or web address]  07/26/2021, 8:37 PM

## 2021-07-26 NOTE — Assessment & Plan Note (Signed)
Prerenal due to volume depletion from GI losses.  Creatinine 2.35 on admission compared to baseline 0.7-0.8. -Continue IV fluid hydration as above -Repeat labs in a.m. -Avoid NSAIDs

## 2021-07-26 NOTE — Assessment & Plan Note (Signed)
Secondary to GI losses.  IV supplementation ordered.

## 2021-07-26 NOTE — Assessment & Plan Note (Signed)
Continue Xarelto 

## 2021-07-26 NOTE — Assessment & Plan Note (Addendum)
-  Continue IV ceftriaxone.   -Urinalysis showed a hazy appearance with moderate hemoglobin, 5 ketones, moderate leukocytes, positive nitrites, 30 protein, many bacteria, greater than 50 WBCs and 6-10 RBCs per high-power field with urine culture pending

## 2021-07-27 DIAGNOSIS — K860 Alcohol-induced chronic pancreatitis: Secondary | ICD-10-CM

## 2021-07-27 DIAGNOSIS — K8689 Other specified diseases of pancreas: Secondary | ICD-10-CM

## 2021-07-27 DIAGNOSIS — E440 Moderate protein-calorie malnutrition: Secondary | ICD-10-CM

## 2021-07-27 DIAGNOSIS — I81 Portal vein thrombosis: Secondary | ICD-10-CM

## 2021-07-27 DIAGNOSIS — Z794 Long term (current) use of insulin: Secondary | ICD-10-CM

## 2021-07-27 DIAGNOSIS — K859 Acute pancreatitis without necrosis or infection, unspecified: Secondary | ICD-10-CM | POA: Diagnosis not present

## 2021-07-27 DIAGNOSIS — E0865 Diabetes mellitus due to underlying condition with hyperglycemia: Secondary | ICD-10-CM | POA: Diagnosis not present

## 2021-07-27 DIAGNOSIS — N179 Acute kidney failure, unspecified: Secondary | ICD-10-CM | POA: Diagnosis not present

## 2021-07-27 DIAGNOSIS — E876 Hypokalemia: Secondary | ICD-10-CM

## 2021-07-27 DIAGNOSIS — E871 Hypo-osmolality and hyponatremia: Secondary | ICD-10-CM

## 2021-07-27 DIAGNOSIS — E43 Unspecified severe protein-calorie malnutrition: Secondary | ICD-10-CM

## 2021-07-27 LAB — CBC
HCT: 49.3 % — ABNORMAL HIGH (ref 36.0–46.0)
Hemoglobin: 16 g/dL — ABNORMAL HIGH (ref 12.0–15.0)
MCH: 27.4 pg (ref 26.0–34.0)
MCHC: 32.5 g/dL (ref 30.0–36.0)
MCV: 84.3 fL (ref 80.0–100.0)
Platelets: 280 10*3/uL (ref 150–400)
RBC: 5.85 MIL/uL — ABNORMAL HIGH (ref 3.87–5.11)
RDW: 13.4 % (ref 11.5–15.5)
WBC: 5.6 10*3/uL (ref 4.0–10.5)
nRBC: 0 % (ref 0.0–0.2)

## 2021-07-27 LAB — LIPASE, BLOOD: Lipase: 52 U/L — ABNORMAL HIGH (ref 11–51)

## 2021-07-27 LAB — GLUCOSE, CAPILLARY
Glucose-Capillary: 110 mg/dL — ABNORMAL HIGH (ref 70–99)
Glucose-Capillary: 88 mg/dL (ref 70–99)
Glucose-Capillary: 128 mg/dL — ABNORMAL HIGH (ref 70–99)
Glucose-Capillary: 163 mg/dL — ABNORMAL HIGH (ref 70–99)
Glucose-Capillary: 119 mg/dL — ABNORMAL HIGH (ref 70–99)

## 2021-07-27 LAB — COMPREHENSIVE METABOLIC PANEL
ALT: 19 U/L (ref 0–44)
AST: 30 U/L (ref 15–41)
Albumin: 4 g/dL (ref 3.5–5.0)
Alkaline Phosphatase: 67 U/L (ref 38–126)
Anion gap: 9 (ref 5–15)
BUN: 39 mg/dL — ABNORMAL HIGH (ref 6–20)
CO2: 26 mmol/L (ref 22–32)
Calcium: 9.8 mg/dL (ref 8.9–10.3)
Chloride: 101 mmol/L (ref 98–111)
Creatinine, Ser: 1.04 mg/dL — ABNORMAL HIGH (ref 0.44–1.00)
GFR, Estimated: >60 mL/min (ref >60)
Glucose, Bld: 101 mg/dL — ABNORMAL HIGH (ref 70–99)
Potassium: 3.2 mmol/L — ABNORMAL LOW (ref 3.5–5.1)
Sodium: 136 mmol/L (ref 135–145)
Total Bilirubin: 0.7 mg/dL (ref 0.3–1.2)
Total Protein: 7.3 g/dL (ref 6.5–8.1)

## 2021-07-27 LAB — MAGNESIUM: Magnesium: 1.8 mg/dL (ref 1.7–2.4)

## 2021-07-27 LAB — HIV ANTIBODY (ROUTINE TESTING W REFLEX): HIV Screen 4th Generation wRfx: NONREACTIVE

## 2021-07-27 MED ORDER — BOOST / RESOURCE BREEZE PO LIQD CUSTOM
1.0000 | Freq: Three times a day (TID) | ORAL | Status: DC
  Administered 2021-07-27 – 2021-07-29 (×6): 1 via ORAL

## 2021-07-27 MED ORDER — MAGNESIUM SULFATE 2 GM/50ML IV SOLN
2.0000 g | Freq: Once | INTRAVENOUS | Status: AC
  Administered 2021-07-27: 2 g via INTRAVENOUS
  Filled 2021-07-27: qty 50

## 2021-07-27 MED ORDER — LACTATED RINGERS IV SOLN: INTRAVENOUS | Status: AC

## 2021-07-27 MED ORDER — PROSOURCE PLUS PO LIQD
30.0000 mL | Freq: Three times a day (TID) | ORAL | Status: DC
  Administered 2021-07-27: 30 mL via ORAL
  Filled 2021-07-27 (×2): qty 30

## 2021-07-27 MED ORDER — PROCHLORPERAZINE EDISYLATE 10 MG/2ML IJ SOLN
10.0000 mg | Freq: Four times a day (QID) | INTRAMUSCULAR | Status: DC | PRN
  Administered 2021-07-27: 10 mg via INTRAVENOUS
  Filled 2021-07-27: qty 2

## 2021-07-27 MED ORDER — POTASSIUM CHLORIDE CRYS ER 20 MEQ PO TBCR
40.0000 meq | EXTENDED_RELEASE_TABLET | Freq: Two times a day (BID) | ORAL | Status: DC
  Filled 2021-07-27: qty 2

## 2021-07-27 NOTE — Progress Notes (Signed)
Initial Nutrition Assessment  INTERVENTION:   -Boost Breeze po TID, each supplement provides 250 kcal and 9 grams of protein  -Prosource Plus PO TID, each provides 100 kcals and 15g protein  -Multivitamin with minerals daily  -B complex with Vitamin C daily  -If unable to tolerate PO, recommend small bore NGT placement. Consult RD if tube feeding is planned.   NUTRITION DIAGNOSIS:   Severe Malnutrition related to chronic illness (pancreatitis) as evidenced by percent weight loss, energy intake < or equal to 75% for > or equal to 1 month.  GOAL:   Patient will meet greater than or equal to 90% of their needs  MONITOR:   Diet advancement, Labs, Weight trends, I & O's  REASON FOR ASSESSMENT:   Malnutrition Screening Tool    ASSESSMENT:   46 y.o. 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  Patient reports her pancreatitis pain has worsened since February 2023. Pt has been having N/V. She knows to follow low fat diet. States her last solid meals was 2-3 months ago. Over the past 2-3 months she has subsisted on water, pedialyte and Ensure Clear supplements. Was taking a children's chewable MVI. Now can't tolerate liquids either. Was NPO at time of visit. Feels very hungry, asking for a cracker.  Noted in GI note, discussion with pt about tube feeding if cannot tolerate POs. On clear liquids at this time.  Per weight records, pt has lost 56 lbs since 10/05/20 (30% wt loss x 10 months, significant for time frame). Pt states overall she has lost 100 lbs total.  Medications: KLOR-CON, Lactated ringers, IV Mg sulfate  Labs reviewed:   CBGs: 88-128 Low K  NUTRITION - FOCUSED PHYSICAL EXAM:  Deferred given pt's pain level  Diet Order:   Diet Order             Diet clear liquid Room service appropriate? Yes; Fluid consistency: Thin  Diet effective now                   EDUCATION NEEDS:   No education needs have been identified at this time  Skin:  Skin Assessment: Reviewed RN Assessment  Last BM:  PTA  Height:   Ht Readings from Last 1 Encounters:  07/26/21 '5\' 7"'$  (1.702 m)    Weight:   Wt Readings from Last 1 Encounters:  07/26/21 58.1 kg    BMI:  Body mass index is 20.05 kg/m.  Estimated Nutritional Needs:   Kcal:  1800-2000  Protein:  90-105g  Fluid:  2L/day  Clayton Bibles, MS, RD, LDN Inpatient Clinical Dietitian Contact information available via Amion

## 2021-07-27 NOTE — Assessment & Plan Note (Signed)
-  GI Consulted and possible Stone Extraction later next month

## 2021-07-27 NOTE — Progress Notes (Signed)
PROGRESS NOTE    [name]  JQZ:[id number] DOB: [birth year] DOA: 07/26/2021 PCP: Teodora Medici, DO   Brief Narrative:  Amy Wolfe is a 46 y.o. 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  She has known chronic pancreatitis associated pancreatic duct stones with ductal dilatation and pancreatic cyst and was seen by Dr. Shaune Spittle already is planning for pancreatic ERCP and EUS with celiac block on 08/22/2021 with attempts to help manage her uncontrolled chronic pain.  She states that she has been having persistent and worsening epigastric pain which has been ongoing since February and pain radiates to her back and she has frequent nausea vomiting spells and is unable to adequately maintain oral intake.  She states that she lost over 100 pounds in last 5 months due to poor appetite and had decreased bowel movements due to poor oral intake.  In the ED CT of the abdomen pelvis was done and showed findings again compatible chronic pancreatitis including pancreatic head calcification as well as pancreatic ductal dilatation with no definite inflammatory stranding.  She had one pancreatic head cyst that was decreased in size and more inferior in the uncinate process.  She is given IV Dilaudid as 1700 mL normal saline bolus as well as IV Phenergan and given IV ceftriaxone and the hospital service was asked to admit this patient.  GIs been consulted and they are recommending pain control and fluid hydration with eventual EUS and celiac axis block for pain control with PRP with possible pancreatic stone extraction.  They are recommending focus on  her nutrition and recommending a nasoenteric tube but the patient wishes to pursue full liquids and high-calorie supplements currently.    Assessment and Plan: * Acute on chronic pancreatitis [facility]) Pancreatic duct stones with chronic pancreatic duct dilation Presenting with progressive and worsening pain, poor oral intake, s/p, weight loss due to acute on chronic alcohol associated pancreatitis.  She says she has remained sober.  Home Percocet not providing significant relief.  Planned for ERCP and EUS celiac block on 8/21 w/  GI, Dr. Rush Landmark. -Keep n.p.o. and now on a clear liquid diet for GI -Continue IV fluid hydration with NS'@125'$  mL/hour overnight -Continue IV antiemetics and analgesics as needed -GI consulted and recommending continuing supportive care for now and pain control  AKI (acute kidney injury) ([place]) -Prerenal due to volume depletion from GI losses.   -Creatinine 2.35 on admission compared to baseline 0.7-0.8. -Continue IV fluid hydration as above -Avoid nephrotoxic medications, contrast dyes, hypotension and dehydration to ensure adequate renal perfusion and renally adjust medications -Repeat labs in a.m. -Avoid NSAIDs -Continue to monitor and trend renal function carefully and repeat CMP in a.m.  Hyponatremia -Mild in setting of volume depletion and hyperglycemia. -Continue IV fluids and repeat labs in a.m. -Sodium up from 131 is now 136  Hypercalcemia -Secondary to volume depletion.  Calcium 11.6 on admission with albumin 4.8.  Mild calcium is 9.8 after fluid hydration -Continue IV fluids and repeat labs in a.m.  Hypokalemia -Secondary to GI losses.  IV supplementation ordered. -Patient's potassium went from 2.9 is now 3.2 and we will continue repletion -Continue monitor replete and repeat CMP in a.m.  Diabetes mellitus due to underlying condition  with hyperglycemia, with long-term current use of insulin (HCC) -Hyperglycemic without evidence of DKA/HHS.   Start on SSI q4h while n.p.o. and adjust as needed. -CBGs now ranging from 88-1 63  UTI (urinary tract infection) -Continue IV ceftriaxone.   -Urinalysis showed a hazy appearance with moderate hemoglobin, 5 ketones, moderate leukocytes, positive nitrites, 30 protein, many bacteria, greater than 50 WBCs and 6-10 RBCs per high-power field with urine culture pending   Portal vein thrombosis -Continue Xarelto.  Protein-calorie malnutrition, severe Nutrition Status: Nutrition Problem: Severe Malnutrition Etiology: chronic illness (pancreatitis) Signs/Symptoms: percent weight loss, energy intake < or equal to 75% for > or equal to 1 month Interventions: Boost Breeze, MVI, Refer to RD note for recommendations     Pancreatic duct stones -GI Consulted and possible Stone Extraction later next month  Tobacco use -Reports ongoing tobacco use.  Nicotine patch provided smoking cessation counseling given   DVT prophylaxis:  rivaroxaban (XARELTO) tablet 20 mg    Code Status: Full Code Family Communication: No family present at bedside   Disposition Plan:  Level of care: Telemetry Status is: Inpatient Remains inpatient appropriate because: Needs further GI clearance    Consultants:  Gastroenterology  Procedures:  None  Antimicrobials:  Anti-infectives (From admission, onward)    Start     Dose/Rate Route Frequency Ordered Stop   07/27/21 1800  cefTRIAXone (ROCEPHIN) 1 g in sodium chloride 0.9 % 100 mL IVPB        1 g 200 mL/hr over 30 Minutes Intravenous Every 24 hours 07/26/21 1918     07/26/21 1845  cefTRIAXone (ROCEPHIN) 1 g in sodium chloride 0.9 % 100 mL IVPB        1 g 200 mL/hr over 30 Minutes Intravenous  Once 07/26/21 1840 07/26/21 1933       Subjective: Seen and examined at bedside and continues to have some abdominal pain and was complaining of significant nausea today.  No chest pain or shortness of breath.  Denies any lightheadedness or dizziness.  Just does not  feel well and continues to have significant abdominal discomfort has lost quite a bit of weight.  No other concerns or complaints this time.  Objective: Vitals:   07/27/21 0013 07/27/21 0350 07/27/21 0833 07/27/21 1333  BP: 140/89 118/77 125/88 107/81  Pulse: 88 74 91 (!) 53  Resp: '16 16 16 16  '$ Temp: 98 F (36.7 C) 97.8 F (36.6 C) 97.9 F (36.6 C) 97.6 F (36.4 C)  TempSrc: Oral Oral Oral Oral  SpO2: 98% 100% 99% 99%  Weight:      Height:        Intake/Output Summary (Last 24 hours) at 07/27/2021 1853 Last data filed at 07/27/2021 1810 Gross per 24 hour  Intake 2500.92 ml  Output --  Net 2500.92 ml   Filed Weights   07/26/21 1359  Weight: 58.1 kg   Examination: Physical Exam:  Constitutional: Thin African-American female currently no acute distress Respiratory: Diminished to auscultation bilaterally, no wheezing, rales, rhonchi or crackles. Normal respiratory effort and patient is not tachypenic. No accessory muscle use.  Unlabored breathing Cardiovascular: RRR, no murmurs / rubs / gallops. S1 and S2 auscultated. No extremity edema.  Abdomen: Soft, tender to palpation, non-distended. Bowel sounds positive.  GU: Deferred. Musculoskeletal: No clubbing / cyanosis of digits/nails. No joint deformity upper and lower extremities.  Skin: No rashes, lesions, ulcers on limited skin evaluation. No induration; Warm and dry.  Neurologic: CN 2-12 grossly intact with no focal deficits.  Romberg sign and cerebellar reflexes not assessed.  Psychiatric: Normal judgment and insight. Alert and oriented x 3.  Anxious  Data Reviewed: I have personally reviewed following labs and imaging studies  CBC: Recent Labs  Lab 07/26/21 1402 07/27/21 0515  WBC 6.2 5.6  HGB 19.1* 16.0*  HCT 57.4* 49.3*  MCV 82.9 84.3  PLT 405* 856   Basic Metabolic Panel: Recent Labs  Lab 07/26/21 1402 07/26/21 2027 07/27/21 0515  NA 131*  --  136  K 2.9*  --  3.2*  CL 90*  --  101  CO2 22  --  26   GLUCOSE 281*  --  101*  BUN 52*  --  39*  CREATININE 2.35*  --  1.04*  CALCIUM 11.6*  --  9.8  MG  --  2.1 1.8   GFR: Estimated Creatinine Clearance: 64 mL/min (A) (by C-G formula based on SCr of 1.04 mg/dL (H)). Liver Function Tests: Recent Labs  Lab 07/26/21 1402 07/27/21 0515  AST 27 30  ALT 20 19  ALKPHOS 92 67  BILITOT 0.7 0.7  PROT 9.4* 7.3  ALBUMIN 4.8 4.0   Recent Labs  Lab 07/26/21 1402 07/27/21 0608  LIPASE 63* 52*   No results for input(s): "AMMONIA" in the last 168 hours. Coagulation Profile: No results for input(s): "INR", "PROTIME" in the last 168 hours. Cardiac Enzymes: No results for input(s): "CKTOTAL", "CKMB", "CKMBINDEX", "TROPONINI" in the last 168 hours. BNP (last 3 results) No results for input(s): "PROBNP" in the last 8760 hours. HbA1C: No results for input(s): "HGBA1C" in the last 72 hours. CBG: Recent Labs  Lab 07/26/21 2034 07/27/21 0352 07/27/21 0727 07/27/21 1138 07/27/21 1549  GLUCAP 178* 110* 88 128* 163*   Lipid Profile: No results for input(s): "CHOL", "HDL", "LDLCALC", "TRIG", "CHOLHDL", "LDLDIRECT" in the last 72 hours. Thyroid Function Tests: No results for input(s): "TSH", "T4TOTAL", "FREET4", "T3FREE", "THYROIDAB" in the last 72 hours. Anemia Panel: No results for input(s): "VITAMINB12", "FOLATE", "FERRITIN", "TIBC", "IRON", "RETICCTPCT" in the last 72 hours. Sepsis Labs: No results for input(s): "PROCALCITON", "LATICACIDVEN" in the last 168 hours.  Recent Results (from the past 240 hour(s))  Urine Culture     Status: Abnormal (Preliminary result)   Collection Time: 07/26/21  8:40 PM   Specimen: Urine, Clean Catch  Result Value Ref Range Status   Specimen Description   Final    URINE, CLEAN CATCH Performed at [facility], [place] [address], [place], [place] [address]    Special Requests   Final    NONE Performed at [facility], [place] [address] [place], [place] [address]     Culture (A)  Final    >=100,000 COLONIES/mL GRAM NEGATIVE RODS IDENTIFICATION AND SUSCEPTIBILITIES TO FOLLOW Performed at [facility], [place] [address], [place], [place] [address]    Report Status PENDING  Incomplete     Radiology Studies: CT Abdomen Pelvis Wo Contrast  Result Date: 07/26/2021 CLINICAL DATA:  Suspected pancreatitis. Abdominal pain. History of pancreatitis. Patient reports postop surgery soon with the pain is out of control. EXAM: CT ABDOMEN AND PELVIS WITHOUT CONTRAST TECHNIQUE: Multidetector CT imaging of the abdomen and pelvis was performed following the standard protocol without IV contrast. RADIATION DOSE REDUCTION: This exam was performed according to the departmental dose-optimization program which includes automated exposure control, adjustment of the mA and/or kV according to patient size and/or use of iterative reconstruction technique. COMPARISON:  AP chest 06/01/2021; CT abdomen and pelvis with  contrast 02/21/2021 and 09/01/2020 FINDINGS: Lower chest: 4 mm lateral right lower lobe pulmonary nodule is unchanged from 03/30/2019 and benign (axial series 4, image 16). Lack of intravenous contrast limits evaluation of the solid abdominal and pelvic organ parenchyma. The following findings are made within this limitation. Hepatobiliary: Smooth liver contours. No gross liver lesion is seen. The gallbladder is grossly unremarkable. Pancreas: There again numerous calcifications within the pancreatic head and uncinate process likely the sequela of chronic calcific pancreatitis. A fluid attenuation cyst is again seen within the pancreatic head. This measures up to approximately 1.4 x 1.4 cm (axial series 2, image 32) mildly decreased in size from 16 x 17 mm on 02/21/2021. Within the more inferior pancreas uncinate process there is a fluid density 2.4 x 1.9 cm cyst that is slightly increased in size from 1.3 by 1.5 cm previously (axial series 2, image 35). The main pancreatic duct is  again diffusely prominent measuring up to approximately 8 mm. Spleen: Grossly unremarkable. Adrenals/Urinary Tract: Normal adrenals. No renal stone or hydronephrosis. Within limitations of lack of intravenous contrast no contour deforming renal mass is seen. No renal or ureteral stone is seen. There is streak artifact from bilateral metallic coils within the pelvis obscuring portions of the distal ureters and the posterolateral aspect of the urinary bladder. No focal urinary bladder wall thickening is seen. Stomach/Bowel: Mild proximal sigmoid diverticulosis. No inflammatory changes to indicate acute diverticulitis. Normal retrocecal appendix (coronal series 5, image 66). The terminal ileum is unremarkable. No dilated loops of bowel to indicate bowel obstruction. Vascular/Lymphatic: No abdominal aortic aneurysm. No mesenteric retroperitoneal or pelvic pathologically enlarged lymph nodes by CT criteria. Reproductive: The uterus appears to be surgically absent. Bilateral adnexal postsurgical metallic coils are visualized. The bilateral ovarian tissue is again grossly unremarkable. Other: Small fat containing umbilical hernia. No free air or free fluid is seen within the abdomen or pelvis. Musculoskeletal: Trace grade 1 anterolisthesis of L4 on L5 is unchanged from prior. No acute skeletal abnormality. IMPRESSION: 1. Compared to 02/21/2021, there are findings again compatible with chronic pancreatitis including pancreatic head calcifications and pancreatic ductal dilatation. No definite inflammatory stranding to indicate acute pancreatitis. 2. One pancreatic head cyst is slightly decreased in size now measuring 13 mm in axial image 32. A more inferior uncinate process cyst measures up to 2.3 cm mildly increased from 1.5 cm previously. Electronically Signed   By: Yvonne Kendall M.D.   On: 07/26/2021 18:07     Scheduled Meds:  (feeding supplement) PROSource Plus  30 mL Oral TID BM   feeding supplement  1 Container  Oral TID BM   insulin aspart  0-9 Units Subcutaneous Q4H   nicotine  21 mg Transdermal Daily   pantoprazole  40 mg Oral BID AC   potassium chloride  40 mEq Oral BID   rivaroxaban  20 mg Oral Q supper   sodium chloride flush  3 mL Intravenous Q12H   Continuous Infusions:  cefTRIAXone (ROCEPHIN)  IV 200 mL/hr at 07/27/21 1810   lactated ringers 125 mL/hr at 07/27/21 1810    LOS: 1 day   Raiford Noble, DO [facility] Available via Epic secure chat 7am-7pm After these hours, please refer to coverage provider listed on [email or web address] 07/27/2021, 6:53 PM

## 2021-07-27 NOTE — TOC Transition Note (Signed)
Transition of Care [facility]) - CM/SW Discharge Note   Patient Details  Name: Amy Wolfe MRN: [id number] Date of Birth: 12/21/1975  Transition of Care [facility]) CM/SW Contact:  Vassie Moselle, LCSW Phone Number: 07/27/2021, 12:35 PM   Clinical Narrative:    Met with pt and provided resources for Medicaid transportation that will provide transportation to medical appointments. CSW also provided pt with community resources including financial resources. As pt has Medicaid her copays for medications are $3 per medicine and she does not qualify for additional medication assistance. Pt was in pain during conversation and could not fully participate in conversation however, requested resources to be left with her to review. TOC signing off at this time. Please reorder if further needs arise.    Final next level of care: Home/Self Care Barriers to Discharge: No Barriers Identified   Patient Goals and CMS Choice Patient states their goals for this hospitalization and ongoing recovery are:: To go home   Choice offered to / list presented to : Patient  Discharge Placement                       Discharge Plan and Services                DME Arranged: N/A DME Agency: NA                  Social Determinants of Health (SDOH) Interventions     Readmission Risk Interventions    07/27/2021   12:34 PM  Readmission Risk Prevention Plan  Transportation Screening Complete  PCP or Specialist Appt within 5-7 Days Complete  Home Care Screening Complete  Medication Review (RN CM) Complete

## 2021-07-27 NOTE — Assessment & Plan Note (Signed)
Nutrition Status: Nutrition Problem: Severe Malnutrition Etiology: chronic illness (pancreatitis) Signs/Symptoms: percent weight loss, energy intake < or equal to 75% for > or equal to 1 month Interventions: Boost Breeze, MVI, Refer to RD note for recommendations

## 2021-07-27 NOTE — Consult Note (Signed)
Referring Provider: Dr. Posey Pronto, [facility] Primary Care Physician:  Teodora Medici, DO Primary Gastroenterologist:  Dr. Silverio Decamp (saw Mansouraty only once recently to discuss advanced interventions)  Reason for Consultation:  Chronic abdominal pain due to chronic pancreatitis and pancreatic duct stones  HPI: Amy Wolfe is a 46 y.o. 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  CT scan of the abdomen and pelvis without contrast. IMPRESSION: 1. Compared to 02/21/2021, there are findings again compatible with chronic pancreatitis including pancreatic head calcifications and pancreatic ductal dilatation. No definite inflammatory stranding to indicate acute pancreatitis. 2. One pancreatic head cyst is slightly decreased in size now measuring 13 mm in axial image 32. A more inferior uncinate process cyst measures up to 2.3 cm mildly increased from 1.5 cm previously.  She was just seen by Dr. Rush Landmark on 07/07/2021--plan was to proceed with scheduling EUS with celiac axis blockade attempt and ERCP with pancreatic duct cannulation attempt and potential stenting and stone extraction with Dr. Rush Landmark on 8/21.  Due to progressive symptoms she came to the emergency department.  Tells me that she is starving, has not been able to eat in 2 months.  Had some dehydration with acute kidney injury and hemoconcentration upon admission.  This has improved with some IV hydration.  Writhing around and dry heaving when I initially walked in the room.  Asking for adjustments in her pain medications and at the end of her visit was asking to try clear liquids.    Past Medical History:   Diagnosis Date   Abdominal pain    Anxiety    Coronary artery disease    Diabetes mellitus without complication (HCC)    Dyspnea    GERD (gastroesophageal reflux disease)    H/O blood clots    Hypertension    Liver abscess    Pancreatitis 2019   PONV (postoperative nausea and vomiting) 11/07/2018   Thyroid disease    Uterine fibroid     Past Surgical History:  Procedure Laterality Date   BIOPSY  06/01/2021   Procedure: BIOPSY;  Surgeon: Irving Copas., MD;  Location: [facility];  Service: Gastroenterology;;   CESAREAN SECTION     EMBOLIZATION N/A 06/14/2018   Procedure: Uterine Embolization;  Surgeon: Algernon Huxley, MD;  Location: [facility];  Service: Cardiovascular;  Laterality: N/A;   ESOPHAGOGASTRODUODENOSCOPY (EGD) WITH PROPOFOL N/A 06/01/2021   Procedure: ESOPHAGOGASTRODUODENOSCOPY (EGD) WITH PROPOFOL;  Surgeon: Rush Landmark Telford Nab., MD;  Location: [facility];  Service: Gastroenterology;  Laterality: N/A;   EUS N/A 06/01/2021   Procedure: UPPER ENDOSCOPIC ULTRASOUND (EUS) RADIAL;  Surgeon: Irving Copas., MD;  Location: [facility];  Service: Gastroenterology;  Laterality: N/A;   laceration finger Right    LAPAROSCOPIC VAGINAL HYSTERECTOMY WITH SALPINGECTOMY Bilateral 10/28/2018   Procedure: LAPAROSCOPIC ASSISTED VAGINAL HYSTERECTOMY BILATERAL WITH SALPINGECTOMY;  Surgeon: Rubie Maid, MD;  Location: [facility];  Service: Gynecology;  Laterality: Bilateral;   THYROIDECTOMY, PARTIAL     VAGINAL HYSTERECTOMY Bilateral 10/28/2018   Procedure: HYSTERECTOMY VAGINAL WITH BILATERAL SALPINGECTOMY;  Surgeon: Rubie Maid, MD;  Location: [facility];  Service: Gynecology;  Laterality: Bilateral;    Prior to Admission medications   Medication Sig Start Date End Date Taking? Authorizing Provider  insulin detemir (LEVEMIR  FLEXPEN) 100 UNIT/ML FlexPen Inject 5 Units into the skin daily. Patient taking differently: Inject 6 Units into the skin daily. 04/05/21   Yes Teodora Medici, DO  lipase/protease/amylase (CREON) 36000 UNITS CPEP capsule Take 1-2 capsules (36,000-72,000 Units total) by mouth See admin instructions. Take 2 capsules (72000u) by mouth three times daily with meals and take 1 capsule (36000u) by mouth twice daily with snacks 02/21/21  Yes Merlyn Lot, MD  Multiple Vitamins-Minerals (MULTIVITAMIN ADULT) CHEW Chew 1 each by mouth daily.   Yes [provider]  oxyCODONE-acetaminophen (PERCOCET/ROXICET) 5-325 MG tablet Take 1 tablet by mouth every 6 (six) hours as needed for severe pain. 07/08/21  Yes Mansouraty, Telford Nab., MD  pantoprazole (PROTONIX) 40 MG tablet Take 1 tablet (40 mg total) by mouth 2 (two) times daily before a meal. 03/08/21  Yes Vladimir Crofts, PA-C  rivaroxaban (XARELTO) 20 MG TABS tablet Take 1 tablet (20 mg total) by mouth daily with supper. 02/21/21  Yes Merlyn Lot, MD  ACCU-CHEK GUIDE test strip USE UP TO 4 TIMES DAILY AS DIRECTED 05/26/21   Teodora Medici, DO  Accu-Chek Softclix Lancets lancets USE UP TO 4 TIMES DAILY AS DIRECTED 05/26/21   Teodora Medici, DO  blood glucose meter kit and supplies KIT Dispense based on patient and insurance preference. Use up to four times daily as directed. 03/24/21   Teodora Medici, DO  blood glucose meter kit and supplies Dispense based on patient and insurance preference. Use up to four times daily as directed. (FOR ICD-10 E10.9, E11.9). 03/28/21   Teodora Medici, DO  Insulin Pen Needle (PEN NEEDLES) 30G X 8 MM MISC 1 each by Does not apply route daily. 04/11/21   Teodora Medici, DO  ondansetron (ZOFRAN-ODT) 4 MG disintegrating tablet Take 1 tablet (4 mg total) by mouth every 8 (eight) hours as needed for nausea or vomiting. Patient not taking: Reported on 07/26/2021 02/21/21   Merlyn Lot, MD  promethazine (PHENERGAN) 12.5 MG tablet Take 1 tablet (12.5 mg total) by mouth every 8 (eight) hours as needed for nausea or vomiting. Patient not  taking: Reported on 07/26/2021 02/24/21   Teodora Medici, DO    Current Facility-Administered Medications  Medication Dose Route Frequency Provider Last Rate Last Admin   acetaminophen (TYLENOL) tablet 1,000 mg  1,000 mg Oral Q6H PRN Lenore Cordia, MD       Or   acetaminophen (TYLENOL) suppository 650 mg  650 mg Rectal Q6H PRN Lenore Cordia, MD       cefTRIAXone (ROCEPHIN) 1 g in sodium chloride 0.9 % 100 mL IVPB  1 g Intravenous Q24H Zada Finders R, MD       HYDROmorphone (DILAUDID) injection 1 mg  1 mg Intravenous Q2H PRN Zada Finders R, MD   1 mg at 07/27/21 0801   insulin aspart (novoLOG) injection 0-9 Units  0-9 Units Subcutaneous Q4H Zada Finders R, MD   2 Units at 07/27/21 0000   magnesium sulfate IVPB 2 g 50 mL  2 g Intravenous Once Sheikh, Omair Latif, DO 50 mL/hr at 07/27/21 0841 2 g at 07/27/21 0841   nicotine (NICODERM CQ - dosed in mg/24 hours) patch 21 mg  21 mg Transdermal Daily Zada Finders R, MD       ondansetron (ZOFRAN) tablet 4 mg  4 mg Oral Q6H PRN Lenore Cordia, MD       Or   ondansetron (ZOFRAN) injection 4 mg  4 mg Intravenous Q6H PRN Lenore Cordia, MD  oxyCODONE-acetaminophen (PERCOCET/ROXICET) 5-325 MG per tablet 1 tablet  1 tablet Oral Q6H PRN Lenore Cordia, MD   1 tablet at 07/27/21 0841   pantoprazole (PROTONIX) EC tablet 40 mg  40 mg Oral BID AC Zada Finders R, MD   40 mg at 07/27/21 0804   potassium chloride SA (KLOR-CON M) CR tablet 40 mEq  40 mEq Oral BID Sheikh, Omair Latif, DO       rivaroxaban (XARELTO) tablet 20 mg  20 mg Oral Q supper Zada Finders R, MD       sodium chloride flush (NS) 0.9 % injection 3 mL  3 mL Intravenous Q12H Lenore Cordia, MD   3 mL at 07/27/21 0847    Allergies as of 07/26/2021   (No Known Allergies)    Family History  Problem Relation Age of Onset   Hypertension Mother    Diabetes Mother    Brain cancer Father    Aneurysm Brother    Bone cancer Maternal Grandmother    Lung cancer Maternal  Grandfather    Cancer Paternal Grandmother    Cancer Paternal Grandfather    Colon cancer Neg Hx    Esophageal cancer Neg Hx    Inflammatory bowel disease Neg Hx    Liver disease Neg Hx    Pancreatic cancer Neg Hx    Rectal cancer Neg Hx    Stomach cancer Neg Hx     Social History   Socioeconomic History   Marital status: Single    Spouse name: Not on file   Number of children: 2   Years of education: 12   Highest education level: Associate degree: academic program  Occupational History   Occupation: [employer]  Tobacco Use   Smoking status: Every Day    Packs/day: 0.50    Years: 35.00    Total pack years: 17.50    Types: Cigarettes   Smokeless tobacco: Never  Vaping Use   Vaping Use: Never used  Substance and Sexual Activity   Alcohol use: Not Currently   Drug use: Yes    Types: Marijuana    Comment: daily   Sexual activity: Yes    Birth control/protection: Surgical  Other Topics Concern   Not on file  Social History Narrative   Not on file   Social Determinants of Health   Financial Resource Strain: Low Risk  (10/04/2018)   Overall Financial Resource Strain (CARDIA)    Difficulty of Paying Living Expenses: Not hard at all  Food Insecurity: No Food Insecurity (10/04/2018)   Hunger Vital Sign    Worried About Running Out of Food in the Last Year: Never true    Ran Out of Food in the Last Year: Never true  Transportation Needs: No Transportation Needs (10/04/2018)   PRAPARE - Hydrologist (Medical): No    Lack of Transportation (Non-Medical): No  Physical Activity: Inactive (10/04/2018)   Exercise Vital Sign    Days of Exercise per Week: 0 days    Minutes of Exercise per Session: 0 min  Stress: No Stress Concern Present (10/04/2018)   [place]    Feeling of Stress : Only a little  Social Connections: Unknown (10/04/2018)   Social Connection and Isolation Panel [NHANES]     Frequency of Communication with Friends and Family: More than three times a week    Frequency of Social Gatherings with Friends and Family: More than three times a week  Attends Religious Services: Never    Active Member of Clubs or Organizations: No    Attends Archivist Meetings: Never    Marital Status: Not on file  Intimate Partner Violence: Not At Risk (10/04/2018)   Humiliation, Afraid, Rape, and Kick questionnaire    Fear of Current or Ex-Partner: No    Emotionally Abused: No    Physically Abused: No    Sexually Abused: No    Review of Systems: ROS is O/W negative except as mentioned in HPI.  Physical Exam: Vital signs in last 24 hours: Temp:  [97.7 F (36.5 C)-98 F (36.7 C)] 97.9 F (36.6 C) (07/26 0833) Pulse Rate:  [74-102] 91 (07/26 0833) Resp:  [14-18] 16 (07/26 0833) BP: (118-140)/(77-94) 125/88 (07/26 0833) SpO2:  [92 %-100 %] 99 % (07/26 0833) Weight:  [58.1 kg] 58.1 kg (07/25 1359)   General:   Alert, thin, cooperative in NAD Head:  Normocephalic and atraumatic. Eyes:  Sclera clear, no icterus.  Conjunctiva pink. Ears:  Normal auditory acuity. Mouth:  No deformity or lesions.   Lungs:  Clear throughout to auscultation.  No wheezes, crackles, or rhonchi.  Heart:  Regular rate and rhythm; no murmurs, clicks, rubs, or gallops. Abdomen:  Soft, non-distended.  BS present.  Diffuse TTP. Msk:  Symmetrical without gross deformities. Pulses:  Normal pulses noted. Extremities:  Without clubbing or edema. Neurologic:  Alert and oriented x 4;  grossly normal neurologically. Skin:  Intact without significant lesions or rashes. Psych:  Alert and cooperative. Normal mood and affect.  Intake/Output from previous day: 07/25 0701 - 07/26 0700 In: 2895.2 [I.V.:751.9; IV Piggyback:2143.3] Out: -  Intake/Output this shift: Total I/O In: 361.1 [I.V.:361.1] Out: -   Lab Results: Recent Labs    07/26/21 1402 07/27/21 0515  WBC 6.2 5.6  HGB 19.1*  16.0*  HCT 57.4* 49.3*  PLT 405* 280   BMET Recent Labs    07/26/21 1402 07/27/21 0515  NA 131* 136  K 2.9* 3.2*  CL 90* 101  CO2 22 26  GLUCOSE 281* 101*  BUN 52* 39*  CREATININE 2.35* 1.04*  CALCIUM 11.6* 9.8   LFT Recent Labs    07/27/21 0515  PROT 7.3  ALBUMIN 4.0  AST 30  ALT 19  ALKPHOS 67  BILITOT 0.7   Studies/Results: CT Abdomen Pelvis Wo Contrast  Result Date: 07/26/2021 CLINICAL DATA:  Suspected pancreatitis. Abdominal pain. History of pancreatitis. Patient reports postop surgery soon with the pain is out of control. EXAM: CT ABDOMEN AND PELVIS WITHOUT CONTRAST TECHNIQUE: Multidetector CT imaging of the abdomen and pelvis was performed following the standard protocol without IV contrast. RADIATION DOSE REDUCTION: This exam was performed according to the departmental dose-optimization program which includes automated exposure control, adjustment of the mA and/or kV according to patient size and/or use of iterative reconstruction technique. COMPARISON:  AP chest 06/01/2021; CT abdomen and pelvis with contrast 02/21/2021 and 09/01/2020 FINDINGS: Lower chest: 4 mm lateral right lower lobe pulmonary nodule is unchanged from 03/30/2019 and benign (axial series 4, image 16). Lack of intravenous contrast limits evaluation of the solid abdominal and pelvic organ parenchyma. The following findings are made within this limitation. Hepatobiliary: Smooth liver contours. No gross liver lesion is seen. The gallbladder is grossly unremarkable. Pancreas: There again numerous calcifications within the pancreatic head and uncinate process likely the sequela of chronic calcific pancreatitis. A fluid attenuation cyst is again seen within the pancreatic head. This measures up to approximately 1.4 x 1.4 cm (  axial series 2, image 32) mildly decreased in size from 16 x 17 mm on 02/21/2021. Within the more inferior pancreas uncinate process there is a fluid density 2.4 x 1.9 cm cyst that is slightly  increased in size from 1.3 by 1.5 cm previously (axial series 2, image 35). The main pancreatic duct is again diffusely prominent measuring up to approximately 8 mm. Spleen: Grossly unremarkable. Adrenals/Urinary Tract: Normal adrenals. No renal stone or hydronephrosis. Within limitations of lack of intravenous contrast no contour deforming renal mass is seen. No renal or ureteral stone is seen. There is streak artifact from bilateral metallic coils within the pelvis obscuring portions of the distal ureters and the posterolateral aspect of the urinary bladder. No focal urinary bladder wall thickening is seen. Stomach/Bowel: Mild proximal sigmoid diverticulosis. No inflammatory changes to indicate acute diverticulitis. Normal retrocecal appendix (coronal series 5, image 66). The terminal ileum is unremarkable. No dilated loops of bowel to indicate bowel obstruction. Vascular/Lymphatic: No abdominal aortic aneurysm. No mesenteric retroperitoneal or pelvic pathologically enlarged lymph nodes by CT criteria. Reproductive: The uterus appears to be surgically absent. Bilateral adnexal postsurgical metallic coils are visualized. The bilateral ovarian tissue is again grossly unremarkable. Other: Small fat containing umbilical hernia. No free air or free fluid is seen within the abdomen or pelvis. Musculoskeletal: Trace grade 1 anterolisthesis of L4 on L5 is unchanged from prior. No acute skeletal abnormality. IMPRESSION: 1. Compared to 02/21/2021, there are findings again compatible with chronic pancreatitis including pancreatic head calcifications and pancreatic ductal dilatation. No definite inflammatory stranding to indicate acute pancreatitis. 2. One pancreatic head cyst is slightly decreased in size now measuring 13 mm in axial image 32. A more inferior uncinate process cyst measures up to 2.3 cm mildly increased from 1.5 cm previously. Electronically Signed   By: Yvonne Kendall M.D.   On: 07/26/2021 18:07     IMPRESSION:  1. Alcohol-induced chronic pancreatitis ([place])   2. Pancreatic duct stones   3. Pancreatic duct dilated   4. Unintentional weight loss   5. Chronic pain syndrome   6. Pancreatic cyst   7.     Dehydration and hemoconcentration and AKI:  Improving with hydration  PLAN: -Plan was to proceed with scheduling EUS with celiac axis blockade attempt and ERCP with pancreatic duct cannulation attempt and potential stenting and stone extraction with Dr. Rush Landmark on 8/21.  Not sure that this would be able to be done any sooner than scheduled. -Will focus on hydration as she seemed hemoconcentrated with AKI.  Will restart LR 125 mL/hour. -I discussed with hospitalist about adjusting pain control. -We discussed need for CorTrak if she cannot tolerate PO.  May need nutrition consult.  She would like to try clear liquids for now (specifically requested soup/broth).   Laban Emperor. Carlito Bogert  07/27/2021, 9:30 AM

## 2021-07-28 DIAGNOSIS — E440 Moderate protein-calorie malnutrition: Secondary | ICD-10-CM | POA: Diagnosis not present

## 2021-07-28 DIAGNOSIS — Z720 Tobacco use: Secondary | ICD-10-CM

## 2021-07-28 DIAGNOSIS — K859 Acute pancreatitis without necrosis or infection, unspecified: Secondary | ICD-10-CM | POA: Diagnosis not present

## 2021-07-28 DIAGNOSIS — N3000 Acute cystitis without hematuria: Secondary | ICD-10-CM

## 2021-07-28 DIAGNOSIS — E0865 Diabetes mellitus due to underlying condition with hyperglycemia: Secondary | ICD-10-CM | POA: Diagnosis not present

## 2021-07-28 DIAGNOSIS — K8689 Other specified diseases of pancreas: Secondary | ICD-10-CM | POA: Diagnosis not present

## 2021-07-28 LAB — GLUCOSE, CAPILLARY
Glucose-Capillary: 112 mg/dL — ABNORMAL HIGH (ref 70–99)
Glucose-Capillary: 131 mg/dL — ABNORMAL HIGH (ref 70–99)
Glucose-Capillary: 185 mg/dL — ABNORMAL HIGH (ref 70–99)
Glucose-Capillary: 193 mg/dL — ABNORMAL HIGH (ref 70–99)
Glucose-Capillary: 223 mg/dL — ABNORMAL HIGH (ref 70–99)
Glucose-Capillary: 89 mg/dL (ref 70–99)

## 2021-07-28 LAB — COMPREHENSIVE METABOLIC PANEL
ALT: 16 U/L (ref 0–44)
AST: 21 U/L (ref 15–41)
Albumin: 3.3 g/dL — ABNORMAL LOW (ref 3.5–5.0)
Alkaline Phosphatase: 60 U/L (ref 38–126)
Anion gap: 6 (ref 5–15)
BUN: 23 mg/dL — ABNORMAL HIGH (ref 6–20)
CO2: 31 mmol/L (ref 22–32)
Calcium: 10.2 mg/dL (ref 8.9–10.3)
Chloride: 102 mmol/L (ref 98–111)
Creatinine, Ser: 0.73 mg/dL (ref 0.44–1.00)
GFR, Estimated: >60 mL/min (ref >60)
Glucose, Bld: 107 mg/dL — ABNORMAL HIGH (ref 70–99)
Potassium: 3.1 mmol/L — ABNORMAL LOW (ref 3.5–5.1)
Sodium: 139 mmol/L (ref 135–145)
Total Bilirubin: 0.3 mg/dL (ref 0.3–1.2)
Total Protein: 6.2 g/dL — ABNORMAL LOW (ref 6.5–8.1)

## 2021-07-28 LAB — CBC WITH DIFFERENTIAL/PLATELET
Abs Immature Granulocytes: 0.01 10*3/uL (ref 0.00–0.07)
Basophils Absolute: 0.1 10*3/uL (ref 0.0–0.1)
Basophils Relative: 1 %
Eosinophils Absolute: 0.1 10*3/uL (ref 0.0–0.5)
Eosinophils Relative: 3 %
HCT: 44.6 % (ref 36.0–46.0)
Hemoglobin: 14.2 g/dL (ref 12.0–15.0)
Immature Granulocytes: 0 %
Lymphocytes Relative: 45 %
Lymphs Abs: 2.1 10*3/uL (ref 0.7–4.0)
MCH: 27.1 pg (ref 26.0–34.0)
MCHC: 31.8 g/dL (ref 30.0–36.0)
MCV: 85.1 fL (ref 80.0–100.0)
Monocytes Absolute: 0.7 10*3/uL (ref 0.1–1.0)
Monocytes Relative: 14 %
Neutro Abs: 1.7 10*3/uL (ref 1.7–7.7)
Neutrophils Relative %: 37 %
Platelets: 260 10*3/uL (ref 150–400)
RBC: 5.24 MIL/uL — ABNORMAL HIGH (ref 3.87–5.11)
RDW: 13.4 % (ref 11.5–15.5)
WBC: 4.6 10*3/uL (ref 4.0–10.5)
nRBC: 0 % (ref 0.0–0.2)

## 2021-07-28 LAB — URINE CULTURE: Culture: >=100000 — AB

## 2021-07-28 LAB — PHOSPHORUS: Phosphorus: 3 mg/dL (ref 2.5–4.6)

## 2021-07-28 LAB — MAGNESIUM: Magnesium: 1.6 mg/dL — ABNORMAL LOW (ref 1.7–2.4)

## 2021-07-28 MED ORDER — POTASSIUM CHLORIDE 20 MEQ PO PACK
40.0000 meq | PACK | Freq: Two times a day (BID) | ORAL | Status: AC
  Administered 2021-07-28 (×2): 40 meq via ORAL
  Filled 2021-07-28 (×2): qty 2

## 2021-07-28 MED ORDER — MAGNESIUM SULFATE 2 GM/50ML IV SOLN
2.0000 g | Freq: Once | INTRAVENOUS | Status: AC
Start: 2021-07-28 — End: 2021-07-28
  Administered 2021-07-28: 2 g via INTRAVENOUS
  Filled 2021-07-28: qty 50

## 2021-07-28 MED ORDER — POTASSIUM CHLORIDE 10 MEQ/100ML IV SOLN: 10.0000 meq | INTRAVENOUS | Status: DC

## 2021-07-28 MED ORDER — POTASSIUM CHLORIDE 10 MEQ/100ML IV SOLN
10.0000 meq | INTRAVENOUS | Status: DC
  Administered 2021-07-28: 10 meq via INTRAVENOUS
  Filled 2021-07-28 (×2): qty 100

## 2021-07-28 NOTE — Progress Notes (Signed)
PROGRESS NOTE    Amy Wolfe  PZW:[id number] DOB: 17-Feb-1975 DOA: 07/26/2021 PCP: Teodora Medici, DO   Brief Narrative:  Amy Wolfe is a 46 y.o. 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  She has known chronic pancreatitis associated pancreatic duct stones with ductal dilatation and pancreatic cyst and was seen by Dr. Shaune Spittle already is planning for pancreatic ERCP and EUS with celiac block on 08/22/2021 with attempts to help manage her uncontrolled chronic pain.  She states that she has been having persistent and worsening epigastric pain which has been ongoing since February and pain radiates to her back and she has frequent nausea vomiting spells and is unable to adequately maintain oral intake.  She states that she lost over 100 pounds in last 5 months due to poor appetite and had decreased bowel movements due to poor oral intake.  In the ED CT of the abdomen pelvis was done and showed findings again compatible chronic pancreatitis including pancreatic head calcification as well as pancreatic ductal dilatation with no definite inflammatory stranding.  She had one pancreatic head cyst that was decreased in size and more inferior in the uncinate process.  She is given IV Dilaudid as 1700 mL normal saline bolus as well as IV Phenergan and given IV ceftriaxone and the hospital service was asked to admit this patient.  GIs been consulted and they are recommending pain control and fluid hydration with eventual EUS and celiac axis block for pain control with PRP with possible pancreatic stone extraction.  They are recommending focus on  her nutrition and recommending a nasoenteric tube but the patient wishes to pursue full liquids and high-calorie supplements currently.  Patient's pain seems to be better controlled and she continues to have some nausea but does not really as bad so GI recommending continue to advance diet as possible and focusing on high-protein high caloric liquid shakes. Urine is growing greater than 100,000 colony-forming units of Klebsiella that is resistant to ampicillin and intermediate to nitrofurantoin.   Assessment and Plan: Acute on chronic pancreatitis [facility]) Pancreatic duct stones with chronic pancreatic duct dilation Presenting with progressive and worsening pain, poor oral intake, s/p, weight loss due to acute on chronic alcohol associated pancreatitis.  She says she has remained sober.  Home Percocet not providing significant relief.  Planned for ERCP and EUS celiac block on 8/21 w/ [facility], Dr. Rush Landmark. -Was n.p.o. but then diet was advanced from a clear liquid diet of liquid diet and now being advanced further given that her nausea and pain is fairly well controlled -Continue IV fluid hydration with NS'@125'$  mL/hour overnight and this is now stopped -Continue IV antiemetics and analgesics as needed -GI consulted and recommending continuing supportive care for now and pain control   AKI (acute kidney injury) ([place]) -Prerenal due to volume depletion from GI losses.   -Creatinine 2.35 on admission compared to baseline 0.7-0.8. -Continue IV fluid hydration as above -Now her BUNs/creatinine is normalizing at 23/0.73 -Avoid nephrotoxic medications, contrast dyes, hypotension and dehydration to ensure adequate renal perfusion and renally adjust medications -Repeat labs in a.m. -Avoid NSAIDs -Continue to monitor and trend renal function carefully and repeat CMP in a.m.   Hyponatremia -Mild in setting  of volume depletion and hyperglycemia. -Continue IV fluids and repeat labs in a.m. -Sodium is now  improved to 139   Hypercalcemia -Secondary to volume depletion.  Calcium 11.6 on admission with albumin 4.8.  Now calcium is 10.2 -IV fluid hydration has now stopped and will repeat in the a.m.  Hypomagnesemia -Patient's mag level was 1.6 -Replete with IV mag sulfate 2 g -Continue to monitor and treat as necessary -Repeat mag in the a.m.   Hypokalemia -Secondary to GI losses.  IV supplementation ordered. -Patient's potassium is now 3.1 -We will replete with IV but patient could not tolerate this so we will change to p.o. potassium chloride 40 mEQ twice daily packets x2 -Continue monitor replete and repeat CMP in a.m.   Diabetes mellitus due to underlying condition with hyperglycemia, with long-term current use of insulin (HCC) -Hyperglycemic without evidence of DKA/HHS.  Start on SSI q4h while n.p.o. and adjust as needed. -CBGs now ranging from 89-223   Klebsiella UTI (urinary tract infection) -Continue IV ceftriaxone while hospitalized -Urinalysis showed a hazy appearance with moderate hemoglobin, 5 ketones, moderate leukocytes, positive nitrites, 30 protein, many bacteria, greater than 50 WBCs and 6-10 RBCs per high-power field with urine culture showing Klebsiella and greater than 100,000 colony-forming units that is resistant to ampicillin and intermediate to nitrofurantoin   Portal vein thrombosis -Continue Xarelto.   Protein-calorie malnutrition, severe Nutrition Status: Nutrition Problem: Severe Malnutrition Etiology: chronic illness (pancreatitis) Signs/Symptoms: percent weight loss, energy intake < or equal to 75% for > or equal to 1 month Interventions: Boost Breeze, MVI, Refer to RD note for recommendations -Diet to be advanced as tolerated    Pancreatic duct stones -GI Consulted and possible Stone Extraction later next month -Current plans are in place for celiac plexus block and ERCP with Dr. Stefani Dama Roddy in the third week of August   Tobacco use -Reports  ongoing tobacco use.  Nicotine patch provided smoking cessation counseling given   DVT prophylaxis:  rivaroxaban (XARELTO) tablet 20 mg    Code Status: Full Code Family Communication: No family currently at bedside  Disposition Plan:  Level of care: Telemetry Status is: Inpatient Remains inpatient appropriate because: Needs to ensure that she can probably take oral intake and maintain her nutritional status and needs clearance by GI   Consultants:  Gastroenterology  Procedures:  None  Antimicrobials:  Anti-infectives (From admission, onward)    Start     Dose/Rate Route Frequency Ordered Stop   07/27/21 1800  cefTRIAXone (ROCEPHIN) 1 g in sodium chloride 0.9 % 100 mL IVPB        1 g 200 mL/hr over 30 Minutes Intravenous Every 24 hours 07/26/21 1918     07/26/21 1845  cefTRIAXone (ROCEPHIN) 1 g in sodium chloride 0.9 % 100 mL IVPB        1 g 200 mL/hr over 30 Minutes Intravenous  Once 07/26/21 1840 07/26/21 1933       Subjective: Seen and examined at bedside and states that she is still having some abdominal discomfort and nausea earlier.  States is better controlled today than it was yesterday.  Wanting to sleep and rest.  Denies any lightheadedness or dizziness.  No other concerns or complaints at this time.  Objective: Vitals:   07/27/21 1333 07/27/21 2005 07/28/21 0553 07/28/21 1418  BP: 107/81 119/71 115/71 110/78  Pulse: (!) 53 64 62 70  Resp: '16 18 18 18  '$ Temp: 97.6 F (36.4 C) 97.9 F (36.6 C) 98 F (36.7  C) 98.1 F (36.7 C)  TempSrc: Oral Oral Oral Oral  SpO2: 99% 100% 100% 100%  Weight:      Height:        Intake/Output Summary (Last 24 hours) at 07/28/2021 1654 Last data filed at 07/28/2021 1538 Gross per 24 hour  Intake 3405.62 ml  Output --  Net 3405.62 ml   Filed Weights   07/26/21 1359  Weight: 58.1 kg   Examination: Physical Exam:  Constitutional: Thin cachectic African-American female currently no acute distress resting in the  bed Respiratory: Diminished to auscultation bilaterally, no wheezing, rales, rhonchi or crackles. Normal respiratory effort and patient is not tachypenic. No accessory muscle use.  Unlabored breathing Cardiovascular: RRR, no murmurs / rubs / gallops. S1 and S2 auscultated. No extremity edema.  Abdomen: Soft, tender to palpate, non-distended. Bowel sounds positive.  GU: Deferred. Musculoskeletal: No clubbing / cyanosis of digits/nails. No joint deformity upper and lower extremities.  Skin: No rashes, lesions, ulcers on limited skin evaluation. No induration; Warm and dry.  Neurologic: CN 2-12 grossly intact with no focal deficits. Romberg sign cerebellar and reflexes not assessed.  Psychiatric: Normal judgment and insight. Alert and oriented x 3. Normal mood and appropriate affect.   Data Reviewed: I have personally reviewed following labs and imaging studies  CBC: Recent Labs  Lab 07/26/21 1402 07/27/21 0515 07/28/21 0542  WBC 6.2 5.6 4.6  NEUTROABS  --   --  1.7  HGB 19.1* 16.0* 14.2  HCT 57.4* 49.3* 44.6  MCV 82.9 84.3 85.1  PLT 405* 280 706   Basic Metabolic Panel: Recent Labs  Lab 07/26/21 1402 07/26/21 2027 07/27/21 0515 07/28/21 0542  NA 131*  --  136 139  K 2.9*  --  3.2* 3.1*  CL 90*  --  101 102  CO2 22  --  26 31  GLUCOSE 281*  --  101* 107*  BUN 52*  --  39* 23*  CREATININE 2.35*  --  1.04* 0.73  CALCIUM 11.6*  --  9.8 10.2  MG  --  2.1 1.8 1.6*  PHOS  --   --   --  3.0   GFR: Estimated Creatinine Clearance: 83.2 mL/min (by C-G formula based on SCr of 0.73 mg/dL). Liver Function Tests: Recent Labs  Lab 07/26/21 1402 07/27/21 0515 07/28/21 0542  AST '27 30 21  '$ ALT '20 19 16  '$ ALKPHOS 92 67 60  BILITOT 0.7 0.7 0.3  PROT 9.4* 7.3 6.2*  ALBUMIN 4.8 4.0 3.3*   Recent Labs  Lab 07/26/21 1402 07/27/21 0608  LIPASE 63* 52*   No results for input(s): "AMMONIA" in the last 168 hours. Coagulation Profile: No results for input(s): "INR", "PROTIME" in the  last 168 hours. Cardiac Enzymes: No results for input(s): "CKTOTAL", "CKMB", "CKMBINDEX", "TROPONINI" in the last 168 hours. BNP (last 3 results) No results for input(s): "PROBNP" in the last 8760 hours. HbA1C: No results for input(s): "HGBA1C" in the last 72 hours. CBG: Recent Labs  Lab 07/28/21 0004 07/28/21 0415 07/28/21 0731 07/28/21 1121 07/28/21 1621  GLUCAP 185* 89 112* 223* 131*   Lipid Profile: No results for input(s): "CHOL", "HDL", "LDLCALC", "TRIG", "CHOLHDL", "LDLDIRECT" in the last 72 hours. Thyroid Function Tests: No results for input(s): "TSH", "T4TOTAL", "FREET4", "T3FREE", "THYROIDAB" in the last 72 hours. Anemia Panel: No results for input(s): "VITAMINB12", "FOLATE", "FERRITIN", "TIBC", "IRON", "RETICCTPCT" in the last 72 hours. Sepsis Labs: No results for input(s): "PROCALCITON", "LATICACIDVEN" in the last 168 hours.  Recent Results (  from the past 240 hour(s))  Urine Culture     Status: Abnormal   Collection Time: 07/26/21  8:40 PM   Specimen: Urine, Clean Catch  Result Value Ref Range Status   Specimen Description   Final    URINE, CLEAN CATCH Performed at [facility], [place] [address] [place], [place] [address]    Special Requests   Final    NONE Performed at [facility], [place] [address] [place], [place] [address]    Culture >=100,000 COLONIES/mL KLEBSIELLA PNEUMONIAE (A)  Final   Report Status 07/28/2021 FINAL  Final   Organism ID, Bacteria KLEBSIELLA PNEUMONIAE (A)  Final      Susceptibility   Klebsiella pneumoniae - MIC*    AMPICILLIN >=32 RESISTANT Resistant     CEFAZOLIN <=4 SENSITIVE Sensitive     CEFEPIME <=0.12 SENSITIVE Sensitive     CEFTRIAXONE <=0.25 SENSITIVE Sensitive     CIPROFLOXACIN <=0.25 SENSITIVE Sensitive     GENTAMICIN <=1 SENSITIVE Sensitive     IMIPENEM <=0.25 SENSITIVE Sensitive     NITROFURANTOIN 64 INTERMEDIATE Intermediate     TRIMETH/SULFA <=20 SENSITIVE Sensitive      AMPICILLIN/SULBACTAM 4 SENSITIVE Sensitive     PIP/TAZO <=4 SENSITIVE Sensitive     * >=100,000 COLONIES/mL KLEBSIELLA PNEUMONIAE     Radiology Studies: CT Abdomen Pelvis Wo Contrast  Result Date: 07/26/2021 CLINICAL DATA:  Suspected pancreatitis. Abdominal pain. History of pancreatitis. Patient reports postop surgery soon with the pain is out of control. EXAM: CT ABDOMEN AND PELVIS WITHOUT CONTRAST TECHNIQUE: Multidetector CT imaging of the abdomen and pelvis was performed following the standard protocol without IV contrast. RADIATION DOSE REDUCTION: This exam was performed according to the departmental dose-optimization program which includes automated exposure control, adjustment of the mA and/or kV according to patient size and/or use of iterative reconstruction technique. COMPARISON:  AP chest 06/01/2021; CT abdomen and pelvis with contrast 02/21/2021 and 09/01/2020 FINDINGS: Lower chest: 4 mm lateral right lower lobe pulmonary nodule is unchanged from 03/30/2019 and benign (axial series 4, image 16). Lack of intravenous contrast limits evaluation of the solid abdominal and pelvic organ parenchyma. The following findings are made within this limitation. Hepatobiliary: Smooth liver contours. No gross liver lesion is seen. The gallbladder is grossly unremarkable. Pancreas: There again numerous calcifications within the pancreatic head and uncinate process likely the sequela of chronic calcific pancreatitis. A fluid attenuation cyst is again seen within the pancreatic head. This measures up to approximately 1.4 x 1.4 cm (axial series 2, image 32) mildly decreased in size from 16 x 17 mm on 02/21/2021. Within the more inferior pancreas uncinate process there is a fluid density 2.4 x 1.9 cm cyst that is slightly increased in size from 1.3 by 1.5 cm previously (axial series 2, image 35). The main pancreatic duct is again diffusely prominent measuring up to approximately 8 mm. Spleen: Grossly unremarkable.  Adrenals/Urinary Tract: Normal adrenals. No renal stone or hydronephrosis. Within limitations of lack of intravenous contrast no contour deforming renal mass is seen. No renal or ureteral stone is seen. There is streak artifact from bilateral metallic coils within the pelvis obscuring portions of the distal ureters and the posterolateral aspect of the urinary bladder. No focal urinary bladder wall thickening is seen. Stomach/Bowel: Mild proximal sigmoid diverticulosis. No inflammatory changes to indicate acute diverticulitis. Normal retrocecal appendix (coronal series 5, image 66). The terminal ileum is unremarkable. No dilated loops of bowel to indicate bowel obstruction. Vascular/Lymphatic: No abdominal aortic  aneurysm. No mesenteric retroperitoneal or pelvic pathologically enlarged lymph nodes by CT criteria. Reproductive: The uterus appears to be surgically absent. Bilateral adnexal postsurgical metallic coils are visualized. The bilateral ovarian tissue is again grossly unremarkable. Other: Small fat containing umbilical hernia. No free air or free fluid is seen within the abdomen or pelvis. Musculoskeletal: Trace grade 1 anterolisthesis of L4 on L5 is unchanged from prior. No acute skeletal abnormality. IMPRESSION: 1. Compared to 02/21/2021, there are findings again compatible with chronic pancreatitis including pancreatic head calcifications and pancreatic ductal dilatation. No definite inflammatory stranding to indicate acute pancreatitis. 2. One pancreatic head cyst is slightly decreased in size now measuring 13 mm in axial image 32. A more inferior uncinate process cyst measures up to 2.3 cm mildly increased from 1.5 cm previously. Electronically Signed   By: Yvonne Kendall M.D.   On: 07/26/2021 18:07     Scheduled Meds:  (feeding supplement) PROSource Plus  30 mL Oral TID BM   feeding supplement  1 Container Oral TID BM   insulin aspart  0-9 Units Subcutaneous Q4H   nicotine  21 mg Transdermal Daily    pantoprazole  40 mg Oral BID AC   potassium chloride  40 mEq Oral BID   rivaroxaban  20 mg Oral Q supper   sodium chloride flush  3 mL Intravenous Q12H   Continuous Infusions:  cefTRIAXone (ROCEPHIN)  IV 200 mL/hr at 07/27/21 1810    LOS: 2 days   Raiford Noble, DO [facility] Available via Epic secure chat 7am-7pm After these hours, please refer to coverage provider listed on [email or web address] 07/28/2021, 4:54 PM

## 2021-07-28 NOTE — Progress Notes (Signed)
Daily Progress Note  Hospital Day: 3  Chief Complaint: abdominal pain, nausea  Brief History Sanaz Bisma Klett is a 46 y.o. female with a pmh not limited to Etoh related chronic pancreatitis, portal vein thrombosis on Xarelto, DM, HTN chronic pain. Admitted with abdominal pain, nausea / vomiting, AKI    Assessment   # 46 yo female with Etoh chronic pancreatitis complicated by pancreatic duct stones and cysts. She was scheduled with Dr. Rush Landmark on 8/21 for EUS with celiac plexus block and ERCP for stone extraction and possible PD stenting. Admitted with progressive symptoms including pain, vomiting.   She has been unable to eat at home but has declined enteral feedings in the hospital. Currently she is tolerating CL diet.  Not sure if our team reached out to Dr. Rush Landmark ( I am just seeing patient for first time today) about whether there is any potential to move the procedure up. I will see what I can find out.   # Portal vein thrombosis, on Xarelto  # Hypokalemia / hypomagnesemia. Repletion in progress  # AKI, resolved.   # UTI, ( > 100K GNR) on Rocephin.    Subjective   Feels okay overall. Tolerated Ensure and broth this am.   Objective   Imaging:  CT Abdomen Pelvis Wo Contrast  Result Date: 07/26/2021 CLINICAL DATA:  Suspected pancreatitis. Abdominal pain. History of pancreatitis. Patient reports postop surgery soon with the pain is out of control. EXAM: CT ABDOMEN AND PELVIS WITHOUT CONTRAST TECHNIQUE: Multidetector CT imaging of the abdomen and pelvis was performed following the standard protocol without IV contrast. RADIATION DOSE REDUCTION: This exam was performed according to the departmental dose-optimization program which includes automated exposure control, adjustment of the mA and/or kV according to patient size and/or use of iterative reconstruction technique. COMPARISON:  AP chest 06/01/2021; CT abdomen and pelvis with contrast 02/21/2021 and 09/01/2020  FINDINGS: Lower chest: 4 mm lateral right lower lobe pulmonary nodule is unchanged from 03/30/2019 and benign (axial series 4, image 16). Lack of intravenous contrast limits evaluation of the solid abdominal and pelvic organ parenchyma. The following findings are made within this limitation. Hepatobiliary: Smooth liver contours. No gross liver lesion is seen. The gallbladder is grossly unremarkable. Pancreas: There again numerous calcifications within the pancreatic head and uncinate process likely the sequela of chronic calcific pancreatitis. A fluid attenuation cyst is again seen within the pancreatic head. This measures up to approximately 1.4 x 1.4 cm (axial series 2, image 32) mildly decreased in size from 16 x 17 mm on 02/21/2021. Within the more inferior pancreas uncinate process there is a fluid density 2.4 x 1.9 cm cyst that is slightly increased in size from 1.3 by 1.5 cm previously (axial series 2, image 35). The main pancreatic duct is again diffusely prominent measuring up to approximately 8 mm. Spleen: Grossly unremarkable. Adrenals/Urinary Tract: Normal adrenals. No renal stone or hydronephrosis. Within limitations of lack of intravenous contrast no contour deforming renal mass is seen. No renal or ureteral stone is seen. There is streak artifact from bilateral metallic coils within the pelvis obscuring portions of the distal ureters and the posterolateral aspect of the urinary bladder. No focal urinary bladder wall thickening is seen. Stomach/Bowel: Mild proximal sigmoid diverticulosis. No inflammatory changes to indicate acute diverticulitis. Normal retrocecal appendix (coronal series 5, image 66). The terminal ileum is unremarkable. No dilated loops of bowel to indicate bowel obstruction. Vascular/Lymphatic: No abdominal aortic aneurysm. No mesenteric retroperitoneal or pelvic pathologically enlarged  lymph nodes by CT criteria. Reproductive: The uterus appears to be surgically absent. Bilateral  adnexal postsurgical metallic coils are visualized. The bilateral ovarian tissue is again grossly unremarkable. Other: Small fat containing umbilical hernia. No free air or free fluid is seen within the abdomen or pelvis. Musculoskeletal: Trace grade 1 anterolisthesis of L4 on L5 is unchanged from prior. No acute skeletal abnormality. IMPRESSION: 1. Compared to 02/21/2021, there are findings again compatible with chronic pancreatitis including pancreatic head calcifications and pancreatic ductal dilatation. No definite inflammatory stranding to indicate acute pancreatitis. 2. One pancreatic head cyst is slightly decreased in size now measuring 13 mm in axial image 32. A more inferior uncinate process cyst measures up to 2.3 cm mildly increased from 1.5 cm previously. Electronically Signed   By: Yvonne Kendall M.D.   On: 07/26/2021 18:07    Lab Results: Recent Labs    07/26/21 1402 07/27/21 0515 07/28/21 0542  WBC 6.2 5.6 4.6  HGB 19.1* 16.0* 14.2  HCT 57.4* 49.3* 44.6  PLT 405* 280 260   BMET Recent Labs    07/26/21 1402 07/27/21 0515 07/28/21 0542  NA 131* 136 139  K 2.9* 3.2* 3.1*  CL 90* 101 102  CO2 '22 26 31  '$ GLUCOSE 281* 101* 107*  BUN 52* 39* 23*  CREATININE 2.35* 1.04* 0.73  CALCIUM 11.6* 9.8 10.2   LFT Recent Labs    07/28/21 0542  PROT 6.2*  ALBUMIN 3.3*  AST 21  ALT 16  ALKPHOS 60  BILITOT 0.3   PT/INR No results for input(s): "LABPROT", "INR" in the last 72 hours.   Scheduled inpatient medications:   (feeding supplement) PROSource Plus  30 mL Oral TID BM   feeding supplement  1 Container Oral TID BM   insulin aspart  0-9 Units Subcutaneous Q4H   nicotine  21 mg Transdermal Daily   pantoprazole  40 mg Oral BID AC   rivaroxaban  20 mg Oral Q supper   sodium chloride flush  3 mL Intravenous Q12H   Continuous inpatient infusions:   cefTRIAXone (ROCEPHIN)  IV 200 mL/hr at 07/27/21 1810   lactated ringers 125 mL/hr at 07/28/21 2426   magnesium sulfate bolus  IVPB     potassium chloride 10 mEq (07/28/21 0814)   PRN inpatient medications: acetaminophen **OR** acetaminophen, HYDROmorphone (DILAUDID) injection, ondansetron **OR** ondansetron (ZOFRAN) IV, oxyCODONE-acetaminophen, prochlorperazine  Vital signs in last 24 hours: Temp:  [97.6 F (36.4 C)-98 F (36.7 C)] 98 F (36.7 C) (07/27 0553) Pulse Rate:  [53-64] 62 (07/27 0553) Resp:  [16-18] 18 (07/27 0553) BP: (107-119)/(71-81) 115/71 (07/27 0553) SpO2:  [99 %-100 %] 100 % (07/27 0553) Last BM Date : 07/25/21  Intake/Output Summary (Last 24 hours) at 07/28/2021 0905 Last data filed at 07/28/2021 8341 Gross per 24 hour  Intake 2253.91 ml  Output --  Net 2253.91 ml     Physical Exam:  General: Alert female in NAD Heart:  Regular rate and rhythm. No lower extremity edema Pulmonary: Normal respiratory effort Abdomen: Soft, nondistended, nontender. Hyperactive bowel sounds.  Neurologic: Alert and oriented Psych: Pleasant. Cooperative.    Intake/Output from previous day: 07/26 0701 - 07/27 0700 In: 2615 [P.O.:240; I.V.:2275; IV Piggyback:100] Out: -  Intake/Output this shift: No intake/output data recorded.    Principal Problem:   Acute on chronic pancreatitis [facility]) Active Problems:   Portal vein thrombosis   Tobacco use   Diabetes mellitus due to underlying condition with hyperglycemia, with long-term current use of insulin ([place])  Pancreatic duct stones   AKI (acute kidney injury) ([place])   Hypokalemia   Hypercalcemia   Hyponatremia   UTI (urinary tract infection)   Moderate protein-calorie malnutrition (HCC)   Protein-calorie malnutrition, severe     LOS: 2 days   Tye Savoy ,NP 07/28/2021, 9:05 AM

## 2021-07-29 DIAGNOSIS — K859 Acute pancreatitis without necrosis or infection, unspecified: Secondary | ICD-10-CM | POA: Diagnosis not present

## 2021-07-29 DIAGNOSIS — E440 Moderate protein-calorie malnutrition: Secondary | ICD-10-CM | POA: Diagnosis not present

## 2021-07-29 DIAGNOSIS — K8689 Other specified diseases of pancreas: Secondary | ICD-10-CM | POA: Diagnosis not present

## 2021-07-29 DIAGNOSIS — K861 Other chronic pancreatitis: Secondary | ICD-10-CM | POA: Diagnosis not present

## 2021-07-29 DIAGNOSIS — E0865 Diabetes mellitus due to underlying condition with hyperglycemia: Secondary | ICD-10-CM | POA: Diagnosis not present

## 2021-07-29 LAB — COMPREHENSIVE METABOLIC PANEL
ALT: 19 U/L (ref 0–44)
AST: 28 U/L (ref 15–41)
Albumin: 3.7 g/dL (ref 3.5–5.0)
Alkaline Phosphatase: 65 U/L (ref 38–126)
Anion gap: 7 (ref 5–15)
BUN: 13 mg/dL (ref 6–20)
CO2: 29 mmol/L (ref 22–32)
Calcium: 8.9 mg/dL (ref 8.9–10.3)
Chloride: 103 mmol/L (ref 98–111)
Creatinine, Ser: 0.67 mg/dL (ref 0.44–1.00)
GFR, Estimated: >60 mL/min (ref >60)
Glucose, Bld: 64 mg/dL — ABNORMAL LOW (ref 70–99)
Potassium: 3.6 mmol/L (ref 3.5–5.1)
Sodium: 139 mmol/L (ref 135–145)
Total Bilirubin: 0.2 mg/dL — ABNORMAL LOW (ref 0.3–1.2)
Total Protein: 7 g/dL (ref 6.5–8.1)

## 2021-07-29 LAB — CBC WITH DIFFERENTIAL/PLATELET
Abs Immature Granulocytes: 0.01 10*3/uL (ref 0.00–0.07)
Basophils Absolute: 0 10*3/uL (ref 0.0–0.1)
Basophils Relative: 1 %
Eosinophils Absolute: 0.1 10*3/uL (ref 0.0–0.5)
Eosinophils Relative: 2 %
HCT: 45.7 % (ref 36.0–46.0)
Hemoglobin: 14.4 g/dL (ref 12.0–15.0)
Immature Granulocytes: 0 %
Lymphocytes Relative: 38 %
Lymphs Abs: 2.1 10*3/uL (ref 0.7–4.0)
MCH: 27.1 pg (ref 26.0–34.0)
MCHC: 31.5 g/dL (ref 30.0–36.0)
MCV: 86.1 fL (ref 80.0–100.0)
Monocytes Absolute: 0.7 10*3/uL (ref 0.1–1.0)
Monocytes Relative: 12 %
Neutro Abs: 2.6 10*3/uL (ref 1.7–7.7)
Neutrophils Relative %: 47 %
Platelets: 274 10*3/uL (ref 150–400)
RBC: 5.31 MIL/uL — ABNORMAL HIGH (ref 3.87–5.11)
RDW: 13.7 % (ref 11.5–15.5)
WBC: 5.5 10*3/uL (ref 4.0–10.5)
nRBC: 0 % (ref 0.0–0.2)

## 2021-07-29 LAB — PHOSPHORUS: Phosphorus: 2.7 mg/dL (ref 2.5–4.6)

## 2021-07-29 LAB — GLUCOSE, CAPILLARY
Glucose-Capillary: 108 mg/dL — ABNORMAL HIGH (ref 70–99)
Glucose-Capillary: 128 mg/dL — ABNORMAL HIGH (ref 70–99)
Glucose-Capillary: 146 mg/dL — ABNORMAL HIGH (ref 70–99)
Glucose-Capillary: 170 mg/dL — ABNORMAL HIGH (ref 70–99)
Glucose-Capillary: 200 mg/dL — ABNORMAL HIGH (ref 70–99)
Glucose-Capillary: 208 mg/dL — ABNORMAL HIGH (ref 70–99)
Glucose-Capillary: 78 mg/dL (ref 70–99)

## 2021-07-29 LAB — MAGNESIUM: Magnesium: 1.9 mg/dL (ref 1.7–2.4)

## 2021-07-29 MED ORDER — CEFAZOLIN SODIUM-DEXTROSE 1-4 GM/50ML-% IV SOLN
1.0000 g | Freq: Two times a day (BID) | INTRAVENOUS | Status: DC
  Administered 2021-07-29 – 2021-07-30 (×2): 1 g via INTRAVENOUS
  Filled 2021-07-29 (×3): qty 50

## 2021-07-29 MED ORDER — OXYCODONE-ACETAMINOPHEN 5-325 MG PO TABS
1.0000 | ORAL_TABLET | Freq: Four times a day (QID) | ORAL | Status: DC | PRN
  Administered 2021-07-29: 1 via ORAL
  Administered 2021-07-30: 2 via ORAL
  Administered 2021-07-30: 1 via ORAL
  Filled 2021-07-29: qty 2
  Filled 2021-07-29: qty 1
  Filled 2021-07-29: qty 2

## 2021-07-29 MED ORDER — PANCRELIPASE (LIP-PROT-AMYL) 12000-38000 UNITS PO CPEP: 36000.0000 [IU] | ORAL_CAPSULE | Freq: Two times a day (BID) | ORAL | Status: DC | PRN

## 2021-07-29 MED ORDER — POLYETHYLENE GLYCOL 3350 17 G PO PACK
17.0000 g | PACK | Freq: Every day | ORAL | Status: DC
Start: 2021-07-29 — End: 2021-07-30
  Administered 2021-07-29 – 2021-07-30 (×2): 17 g via ORAL
  Filled 2021-07-29 (×2): qty 1

## 2021-07-29 MED ORDER — POTASSIUM CHLORIDE 20 MEQ PO PACK
40.0000 meq | PACK | Freq: Once | ORAL | Status: DC
  Filled 2021-07-29: qty 2

## 2021-07-29 MED ORDER — HYDROMORPHONE HCL 1 MG/ML IJ SOLN
1.0000 mg | INTRAMUSCULAR | Status: DC | PRN
  Administered 2021-07-29 – 2021-07-30 (×3): 1 mg via INTRAVENOUS
  Filled 2021-07-29 (×4): qty 1

## 2021-07-29 MED ORDER — PANCRELIPASE (LIP-PROT-AMYL) 12000-38000 UNITS PO CPEP
36000.0000 [IU] | ORAL_CAPSULE | Freq: Three times a day (TID) | ORAL | Status: DC
  Administered 2021-07-30: 36 [IU] via ORAL
  Administered 2021-07-30: 36000 [IU] via ORAL
  Filled 2021-07-29 (×2): qty 3

## 2021-07-29 NOTE — Progress Notes (Signed)
PROGRESS NOTE    Amy Wolfe  EPP:[id number] DOB: 02/11/1975 DOA: 07/26/2021 PCP: Teodora Medici, DO   Brief Narrative:  Amy Wolfe is a 46 y.o. 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  She has known chronic pancreatitis associated pancreatic duct stones with ductal dilatation and pancreatic cyst and was seen by Dr. Shaune Spittle already is planning for pancreatic ERCP and EUS with celiac block on 08/22/2021 with attempts to help manage her uncontrolled chronic pain.  She states that she has been having persistent and worsening epigastric pain which has been ongoing since February and pain radiates to her back and she has frequent nausea vomiting spells and is unable to adequately maintain oral intake.  She states that she lost over 100 pounds in last 5 months due to poor appetite and had decreased bowel movements due to poor oral intake.  In the ED CT of the abdomen pelvis was done and showed findings again compatible chronic pancreatitis including pancreatic head calcification as well as pancreatic ductal dilatation with no definite inflammatory stranding.  She had one pancreatic head cyst that was decreased in size and more inferior in the uncinate process.  She is given IV Dilaudid as 1700 mL normal saline bolus as well as IV Phenergan and given IV ceftriaxone and the hospital service was asked to admit this patient.  GIs been consulted and they are recommending pain control and fluid hydration with eventual EUS and celiac axis block for pain control with PRP with possible pancreatic stone extraction.  They are recommending focus on  her nutrition and recommending a nasoenteric tube but the patient wishes to pursue full liquids and high-calorie supplements currently.  Patient's pain seems to be better controlled and she continues to have some nausea but does not really as bad so GI recommending continue to advance diet as possible and focusing on high-protein high caloric liquid shakes. Urine is growing greater than 100,000 colony-forming units of Klebsiella that is resistant to ampicillin and intermediate to nitrofurantoin.   Assessment and Plan: Acute on chronic pancreatitis [facility]) Pancreatic duct stones with chronic pancreatic duct dilation Presenting with progressive and worsening pain, poor oral intake, s/p, weight loss due to acute on chronic alcohol associated pancreatitis.  She says she has remained sober.  Home Percocet not providing significant relief.  Planned for ERCP and EUS celiac block on 8/21 w/ [facility], Dr. Rush Landmark. IV fluid hydration now stopped -Continue IV antiemetics and analgesics as needed -GI consulted and recommending continuing supportive care for now and pain control -We will start weaning her IV pain medications and changed to IV hydromorphone from every 2 as needed to every 4 and increase the dose of her oxycodone from 5 to 10 mg -Patient's diet was advanced from a full liquid diet to a soft diet; if she tolerates her soft diet for dinner she can be discharged in the a.m.   AKI (acute kidney injury) ([place]) -Prerenal due to volume depletion from GI losses.   -Creatinine 2.35 on admission compared to baseline 0.7-0.8. -Continue IV fluid hydration as above -Now her BUNs/creatinine is normalizing at 23/0.73 yesterday and today is 13/0.67 -Avoid nephrotoxic medications, contrast dyes, hypotension and dehydration to ensure adequate renal perfusion and  renally adjust medications -Repeat labs in a.m. -Avoid NSAIDs -Continue to monitor and trend renal function carefully and repeat CMP in a.m.    Hyponatremia -Mild in setting of volume depletion and hyperglycemia. -Continue IV fluids and repeat labs in a.m. -Sodium is now improved to 139 again   Hypercalcemia -Secondary to volume depletion.  Calcium 11.6 on admission with albumin 4.8.  Now calcium is 8 point -IV fluid hydration has now stopped and will repeat in the a.m.   Hypomagnesemia -Patient's mag level is now 1.9 -Replete with IV mag sulfate 2 g yesterday -Continue to monitor and treat as necessary -Repeat mag in the a.m.   Hypokalemia -Secondary to GI losses.  IV supplementation ordered. -Patient's potassium is now 3.6 -Continue monitor replete and repeat CMP in a.m.   Diabetes mellitus due to underlying condition with hyperglycemia, with long-term current use of insulin (HCC) -Hyperglycemic without evidence of DKA/HHS.  Start on SSI q4h while n.p.o. and adjust as needed. -CBGs now ranging from 78-208   Klebsiella UTI (urinary tract infection) -Continued IV ceftriaxone while hospitalized have now de-escalated this to IV cefazolin and will change to p.o. at the time of discharge -Urinalysis showed a hazy appearance with moderate hemoglobin, 5 ketones, moderate leukocytes, positive nitrites, 30 protein, many bacteria, greater than 50 WBCs and 6-10 RBCs per high-power field with urine culture showing Klebsiella and greater than 100,000 colony-forming units that is resistant to ampicillin and intermediate to nitrofurantoin   Portal vein thrombosis -Continue Xarelto.   Protein-calorie malnutrition, severe -Nutrition Status: Nutrition Problem: Severe Malnutrition Etiology: chronic illness (pancreatitis) Signs/Symptoms: percent weight loss, energy intake < or equal to 75% for > or equal to 1 month Interventions: Boost Breeze, MVI, Refer to RD note for recommendations    Pancreatic duct stones -GI Consulted and possible Stone Extraction later next month -Current plans are in place for celiac plexus block and ERCP with  Dr. Rush Landmark in the third week of August   Tobacco use -Reports ongoing tobacco use.  Nicotine patch provided smoking cessation counseling given   DVT prophylaxis:  rivaroxaban (XARELTO) tablet 20 mg    Code Status: Full Code Family Communication: No family present at bedside   Disposition Plan:  Level of care: Telemetry Status is: Inpatient Remains inpatient appropriate because: Continues to tolerate her diet without issues and anticipating discharge in the next 24 hours   Consultants:  Gastroenterology  Procedures:  None  Antimicrobials:  Anti-infectives (From admission, onward)    Start     Dose/Rate Route Frequency Ordered Stop   07/29/21 1800  ceFAZolin (ANCEF) IVPB 1 g/50 mL premix        1 g 100 mL/hr over 30 Minutes Intravenous Every 12 hours 07/29/21 1345     07/27/21 1800  cefTRIAXone (ROCEPHIN) 1 g in sodium chloride 0.9 % 100 mL IVPB  Status:  Discontinued        1 g 200 mL/hr over 30 Minutes Intravenous Every 24 hours 07/26/21 1918 07/29/21 1345   07/26/21 1845  cefTRIAXone (ROCEPHIN) 1 g in sodium chloride 0.9 % 100 mL IVPB        1 g 200 mL/hr over 30 Minutes Intravenous  Once 07/26/21 1840 07/26/21 1933       Subjective: Seen and examined at bedside and was still having some pain but wanting to advance her diet.  States her nausea is still there but not as bad.  Denies any chest pain or lightheadedness or dizziness.  No other concerns or  complaints at this time.  Objective: Vitals:   07/28/21 1418 07/28/21 2014 07/29/21 0435 07/29/21 1446  BP: 110/78 (!) 136/100 124/80 136/79  Pulse: 70 88 74 79  Resp: '18 20 20 16  '$ Temp: 98.1 F (36.7 C) 98.7 F (37.1 C) 98.2 F (36.8 C) 98.5 F (36.9 C)  TempSrc: Oral Oral Oral Oral  SpO2: 100% 100% 100% 100%  Weight:      Height:        Intake/Output Summary (Last 24 hours) at 07/29/2021 1732 Last data filed at 07/29/2021 1703 Gross per 24 hour  Intake 694.5 ml  Output --  Net 694.5 ml   Filed Weights    07/26/21 1359  Weight: 58.1 kg   Examination: Physical Exam:  Constitutional: Thin African-American female currently no acute distress appears calm Respiratory: Diminished to auscultation bilaterally, no wheezing, rales, rhonchi or crackles. Normal respiratory effort and patient is not tachypenic. No accessory muscle use.  Unlabored breathing Cardiovascular: RRR, no murmurs / rubs / gallops. S1 and S2 auscultated. No extremity edema. Abdomen: Soft, a little-tender, non-distended. Bowel sounds positive.  GU: Deferred. Musculoskeletal: No clubbing / cyanosis of digits/nails. No joint deformity upper and lower extremities.  Skin: No rashes, lesions, ulcers on limited skin evaluation. No induration; Warm and dry.  Neurologic: CN 2-12 grossly intact with no focal deficits.  Romberg sign cerebellar and reflexes not assessed.  Psychiatric: Normal judgment and insight. Alert and oriented x 3. Normal mood and appropriate affect.   Data Reviewed: I have personally reviewed following labs and imaging studies  CBC: Recent Labs  Lab 07/26/21 1402 07/27/21 0515 07/28/21 0542 07/29/21 0546  WBC 6.2 5.6 4.6 5.5  NEUTROABS  --   --  1.7 2.6  HGB 19.1* 16.0* 14.2 14.4  HCT 57.4* 49.3* 44.6 45.7  MCV 82.9 84.3 85.1 86.1  PLT 405* 280 260 295   Basic Metabolic Panel: Recent Labs  Lab 07/26/21 1402 07/26/21 2027 07/27/21 0515 07/28/21 0542 07/29/21 0546  NA 131*  --  136 139 139  K 2.9*  --  3.2* 3.1* 3.6  CL 90*  --  101 102 103  CO2 22  --  '26 31 29  '$ GLUCOSE 281*  --  101* 107* 64*  BUN 52*  --  39* 23* 13  CREATININE 2.35*  --  1.04* 0.73 0.67  CALCIUM 11.6*  --  9.8 10.2 8.9  MG  --  2.1 1.8 1.6* 1.9  PHOS  --   --   --  3.0 2.7   GFR: Estimated Creatinine Clearance: 83.2 mL/min (by C-G formula based on SCr of 0.67 mg/dL). Liver Function Tests: Recent Labs  Lab 07/26/21 1402 07/27/21 0515 07/28/21 0542 07/29/21 0546  AST '27 30 21 28  '$ ALT '20 19 16 19  '$ ALKPHOS 92 67 60  65  BILITOT 0.7 0.7 0.3 0.2*  PROT 9.4* 7.3 6.2* 7.0  ALBUMIN 4.8 4.0 3.3* 3.7   Recent Labs  Lab 07/26/21 1402 07/27/21 0608  LIPASE 63* 52*   No results for input(s): "AMMONIA" in the last 168 hours. Coagulation Profile: No results for input(s): "INR", "PROTIME" in the last 168 hours. Cardiac Enzymes: No results for input(s): "CKTOTAL", "CKMB", "CKMBINDEX", "TROPONINI" in the last 168 hours. BNP (last 3 results) No results for input(s): "PROBNP" in the last 8760 hours. HbA1C: No results for input(s): "HGBA1C" in the last 72 hours. CBG: Recent Labs  Lab 07/29/21 0003 07/29/21 0436 07/29/21 0736 07/29/21 1146 07/29/21 1649  GLUCAP 128*  208* 78 200* 146*   Lipid Profile: No results for input(s): "CHOL", "HDL", "LDLCALC", "TRIG", "CHOLHDL", "LDLDIRECT" in the last 72 hours. Thyroid Function Tests: No results for input(s): "TSH", "T4TOTAL", "FREET4", "T3FREE", "THYROIDAB" in the last 72 hours. Anemia Panel: No results for input(s): "VITAMINB12", "FOLATE", "FERRITIN", "TIBC", "IRON", "RETICCTPCT" in the last 72 hours. Sepsis Labs: No results for input(s): "PROCALCITON", "LATICACIDVEN" in the last 168 hours.  Recent Results (from the past 240 hour(s))  Urine Culture     Status: Abnormal   Collection Time: 07/26/21  8:40 PM   Specimen: Urine, Clean Catch  Result Value Ref Range Status   Specimen Description   Final    URINE, CLEAN CATCH Performed at [facility], [place] [address] [place], [place] [address]    Special Requests   Final    NONE Performed at [facility], [place] [address] [place], [place] [address]    Culture >=100,000 COLONIES/mL KLEBSIELLA PNEUMONIAE (A)  Final   Report Status 07/28/2021 FINAL  Final   Organism ID, Bacteria KLEBSIELLA PNEUMONIAE (A)  Final      Susceptibility   Klebsiella pneumoniae - MIC*    AMPICILLIN >=32 RESISTANT Resistant     CEFAZOLIN <=4 SENSITIVE Sensitive     CEFEPIME <=0.12 SENSITIVE  Sensitive     CEFTRIAXONE <=0.25 SENSITIVE Sensitive     CIPROFLOXACIN <=0.25 SENSITIVE Sensitive     GENTAMICIN <=1 SENSITIVE Sensitive     IMIPENEM <=0.25 SENSITIVE Sensitive     NITROFURANTOIN 64 INTERMEDIATE Intermediate     TRIMETH/SULFA <=20 SENSITIVE Sensitive     AMPICILLIN/SULBACTAM 4 SENSITIVE Sensitive     PIP/TAZO <=4 SENSITIVE Sensitive     * >=100,000 COLONIES/mL KLEBSIELLA PNEUMONIAE    Radiology Studies: No results found.  Scheduled Meds:  (feeding supplement) PROSource Plus  30 mL Oral TID BM   feeding supplement  1 Container Oral TID BM   insulin aspart  0-9 Units Subcutaneous Q4H   lipase/protease/amylase  36,000-72,000 Units Oral TID WC   nicotine  21 mg Transdermal Daily   pantoprazole  40 mg Oral BID AC   polyethylene glycol  17 g Oral Daily   potassium chloride  40 mEq Oral Once   rivaroxaban  20 mg Oral Q supper   sodium chloride flush  3 mL Intravenous Q12H   Continuous Infusions:   ceFAZolin (ANCEF) IV 1 g (07/29/21 1702)    LOS: 3 days   Raiford Noble, DO [facility] Available via Epic secure chat 7am-7pm After these hours, please refer to coverage provider listed on [email or web address] 07/29/2021, 5:32 PM

## 2021-07-29 NOTE — Progress Notes (Signed)
Daily Progress Note  Hospital Day: 4  Chief Complaint: abdominal pain, nausea  Brief History Amy Wolfe is a 46 y.o. female with a pmh not limited to Etoh related chronic pancreatitis, portal vein thrombosis on Xarelto, DM, HTN chronic pain. Admitted with abdominal pain, nausea / vomiting, AKI    Assessment / Plan   # 46 yo female with chronic Etoh pancreatitis complicated by pancreatic duct stones and cysts. She was scheduled with Dr. Rush Landmark on 8/21 for EUS with celiac plexus block and ERCP for stone extraction and possible PD stenting. Admitted in interim with progressive symptoms including pain, vomiting.   Tolerating soft diet, only mild nausea. Did fine with grits this am, has ordered a [place] sandwich for lunch Continue Boost TID Would transition her to oral pain meds.  ? Maybe home tomorrow  # Constipation. Due in part to decreased PO intake. Also opioids.  Will start daily Miralax   # Portal vein thrombosis, on Xarelto   # Hypokalemia / hypomagnesemia. Resolved   # AKI, resolved.    # UTI, Klebsiella     Subjective   Only mild nausea. Had grits for breakfast and ordered [place] sandwich for lunch. Says she hasn't had a BM " in months"   Objective   Imaging:  CT Abdomen Pelvis Wo Contrast  Result Date: 07/26/2021 CLINICAL DATA:  Suspected pancreatitis. Abdominal pain. History of pancreatitis. Patient reports postop surgery soon with the pain is out of control. EXAM: CT ABDOMEN AND PELVIS WITHOUT CONTRAST TECHNIQUE: Multidetector CT imaging of the abdomen and pelvis was performed following the standard protocol without IV contrast. RADIATION DOSE REDUCTION: This exam was performed according to the departmental dose-optimization program which includes automated exposure control, adjustment of the mA and/or kV according to patient size and/or use of iterative reconstruction technique. COMPARISON:  AP chest 06/01/2021; CT abdomen and pelvis with contrast  02/21/2021 and 09/01/2020 FINDINGS: Lower chest: 4 mm lateral right lower lobe pulmonary nodule is unchanged from 03/30/2019 and benign (axial series 4, image 16). Lack of intravenous contrast limits evaluation of the solid abdominal and pelvic organ parenchyma. The following findings are made within this limitation. Hepatobiliary: Smooth liver contours. No gross liver lesion is seen. The gallbladder is grossly unremarkable. Pancreas: There again numerous calcifications within the pancreatic head and uncinate process likely the sequela of chronic calcific pancreatitis. A fluid attenuation cyst is again seen within the pancreatic head. This measures up to approximately 1.4 x 1.4 cm (axial series 2, image 32) mildly decreased in size from 16 x 17 mm on 02/21/2021. Within the more inferior pancreas uncinate process there is a fluid density 2.4 x 1.9 cm cyst that is slightly increased in size from 1.3 by 1.5 cm previously (axial series 2, image 35). The main pancreatic duct is again diffusely prominent measuring up to approximately 8 mm. Spleen: Grossly unremarkable. Adrenals/Urinary Tract: Normal adrenals. No renal stone or hydronephrosis. Within limitations of lack of intravenous contrast no contour deforming renal mass is seen. No renal or ureteral stone is seen. There is streak artifact from bilateral metallic coils within the pelvis obscuring portions of the distal ureters and the posterolateral aspect of the urinary bladder. No focal urinary bladder wall thickening is seen. Stomach/Bowel: Mild proximal sigmoid diverticulosis. No inflammatory changes to indicate acute diverticulitis. Normal retrocecal appendix (coronal series 5, image 66). The terminal ileum is unremarkable. No dilated loops of bowel to indicate bowel obstruction. Vascular/Lymphatic: No abdominal aortic aneurysm. No mesenteric retroperitoneal or  pelvic pathologically enlarged lymph nodes by CT criteria. Reproductive: The uterus appears to be  surgically absent. Bilateral adnexal postsurgical metallic coils are visualized. The bilateral ovarian tissue is again grossly unremarkable. Other: Small fat containing umbilical hernia. No free air or free fluid is seen within the abdomen or pelvis. Musculoskeletal: Trace grade 1 anterolisthesis of L4 on L5 is unchanged from prior. No acute skeletal abnormality. IMPRESSION: 1. Compared to 02/21/2021, there are findings again compatible with chronic pancreatitis including pancreatic head calcifications and pancreatic ductal dilatation. No definite inflammatory stranding to indicate acute pancreatitis. 2. One pancreatic head cyst is slightly decreased in size now measuring 13 mm in axial image 32. A more inferior uncinate process cyst measures up to 2.3 cm mildly increased from 1.5 cm previously. Electronically Signed   By: Yvonne Kendall M.D.   On: 07/26/2021 18:07    Lab Results: Recent Labs    07/27/21 0515 07/28/21 0542 07/29/21 0546  WBC 5.6 4.6 5.5  HGB 16.0* 14.2 14.4  HCT 49.3* 44.6 45.7  PLT 280 260 274   BMET Recent Labs    07/27/21 0515 07/28/21 0542 07/29/21 0546  NA 136 139 139  K 3.2* 3.1* 3.6  CL 101 102 103  CO2 '26 31 29  '$ GLUCOSE 101* 107* 64*  BUN 39* 23* 13  CREATININE 1.04* 0.73 0.67  CALCIUM 9.8 10.2 8.9   LFT Recent Labs    07/29/21 0546  PROT 7.0  ALBUMIN 3.7  AST 28  ALT 19  ALKPHOS 65  BILITOT 0.2*   PT/INR No results for input(s): "LABPROT", "INR" in the last 72 hours.   Scheduled inpatient medications:   (feeding supplement) PROSource Plus  30 mL Oral TID BM   feeding supplement  1 Container Oral TID BM   insulin aspart  0-9 Units Subcutaneous Q4H   nicotine  21 mg Transdermal Daily   pantoprazole  40 mg Oral BID AC   potassium chloride  40 mEq Oral Once   rivaroxaban  20 mg Oral Q supper   sodium chloride flush  3 mL Intravenous Q12H   Continuous inpatient infusions:   cefTRIAXone (ROCEPHIN)  IV 1 g (07/28/21 1702)   PRN inpatient  medications: acetaminophen **OR** acetaminophen, HYDROmorphone (DILAUDID) injection, ondansetron **OR** ondansetron (ZOFRAN) IV, oxyCODONE-acetaminophen, prochlorperazine  Vital signs in last 24 hours: Temp:  [98.1 F (36.7 C)-98.7 F (37.1 C)] 98.2 F (36.8 C) (07/28 0435) Pulse Rate:  [70-88] 74 (07/28 0435) Resp:  [18-20] 20 (07/28 0435) BP: (110-136)/(78-100) 124/80 (07/28 0435) SpO2:  [100 %] 100 % (07/28 0435) Last BM Date : 07/25/21  Intake/Output Summary (Last 24 hours) at 07/29/2021 1011 Last data filed at 07/29/2021 0802 Gross per 24 hour  Intake 1151.71 ml  Output --  Net 1151.71 ml     Physical Exam:  General: Alert female in NAD Heart:  Regular rate and rhythm. No lower extremity edema Pulmonary: Normal respiratory effort Abdomen: Soft, nondistended, nontender. Normal bowel sounds.  Neurologic: Alert and oriented Psych: Pleasant. Cooperative.    Intake/Output from previous day: 07/27 0701 - 07/28 0700 In: 1151.7 [P.O.:296; I.V.:805.7; IV Piggyback:50] Out: -  Intake/Output this shift: Total I/O In: 118 [P.O.:118] Out: -     Principal Problem:   Acute on chronic pancreatitis (HCC) Active Problems:   Portal vein thrombosis   Tobacco use   Diabetes mellitus due to underlying condition with hyperglycemia, with long-term current use of insulin (HCC)   Pancreatic duct stones   AKI (acute kidney injury) (  [place])   Hypokalemia   Hypercalcemia   Hyponatremia   UTI (urinary tract infection)   Moderate protein-calorie malnutrition (HCC)   Protein-calorie malnutrition, severe     LOS: 3 days   Tye Savoy ,NP 07/29/2021, 10:11 AM

## 2021-07-30 DIAGNOSIS — N179 Acute kidney failure, unspecified: Secondary | ICD-10-CM | POA: Diagnosis not present

## 2021-07-30 DIAGNOSIS — K859 Acute pancreatitis without necrosis or infection, unspecified: Secondary | ICD-10-CM | POA: Diagnosis not present

## 2021-07-30 DIAGNOSIS — N390 Urinary tract infection, site not specified: Secondary | ICD-10-CM

## 2021-07-30 DIAGNOSIS — E0865 Diabetes mellitus due to underlying condition with hyperglycemia: Secondary | ICD-10-CM | POA: Diagnosis not present

## 2021-07-30 LAB — CBC WITH DIFFERENTIAL/PLATELET
Abs Immature Granulocytes: 0.01 10*3/uL (ref 0.00–0.07)
Basophils Absolute: 0 10*3/uL (ref 0.0–0.1)
Basophils Relative: 1 %
Eosinophils Absolute: 0.1 10*3/uL (ref 0.0–0.5)
Eosinophils Relative: 3 %
HCT: 39.7 % (ref 36.0–46.0)
Hemoglobin: 12.7 g/dL (ref 12.0–15.0)
Immature Granulocytes: 0 %
Lymphocytes Relative: 39 %
Lymphs Abs: 1.8 10*3/uL (ref 0.7–4.0)
MCH: 27.4 pg (ref 26.0–34.0)
MCHC: 32 g/dL (ref 30.0–36.0)
MCV: 85.7 fL (ref 80.0–100.0)
Monocytes Absolute: 0.7 10*3/uL (ref 0.1–1.0)
Monocytes Relative: 15 %
Neutro Abs: 1.9 10*3/uL (ref 1.7–7.7)
Neutrophils Relative %: 42 %
Platelets: 215 10*3/uL (ref 150–400)
RBC: 4.63 MIL/uL (ref 3.87–5.11)
RDW: 13.9 % (ref 11.5–15.5)
WBC: 4.6 10*3/uL (ref 4.0–10.5)
nRBC: 0 % (ref 0.0–0.2)

## 2021-07-30 LAB — COMPREHENSIVE METABOLIC PANEL
ALT: 14 U/L (ref 0–44)
AST: 19 U/L (ref 15–41)
Albumin: 2.7 g/dL — ABNORMAL LOW (ref 3.5–5.0)
Alkaline Phosphatase: 46 U/L (ref 38–126)
Anion gap: 5 (ref 5–15)
BUN: 11 mg/dL (ref 6–20)
CO2: 30 mmol/L (ref 22–32)
Calcium: 7.6 mg/dL — ABNORMAL LOW (ref 8.9–10.3)
Chloride: 103 mmol/L (ref 98–111)
Creatinine, Ser: 0.56 mg/dL (ref 0.44–1.00)
GFR, Estimated: >60 mL/min (ref >60)
Glucose, Bld: 155 mg/dL — ABNORMAL HIGH (ref 70–99)
Potassium: 4 mmol/L (ref 3.5–5.1)
Sodium: 138 mmol/L (ref 135–145)
Total Bilirubin: 0.2 mg/dL — ABNORMAL LOW (ref 0.3–1.2)
Total Protein: 5.1 g/dL — ABNORMAL LOW (ref 6.5–8.1)

## 2021-07-30 LAB — GLUCOSE, CAPILLARY
Glucose-Capillary: 161 mg/dL — ABNORMAL HIGH (ref 70–99)
Glucose-Capillary: 77 mg/dL (ref 70–99)
Glucose-Capillary: 146 mg/dL — ABNORMAL HIGH (ref 70–99)

## 2021-07-30 LAB — PHOSPHORUS: Phosphorus: 1.6 mg/dL — ABNORMAL LOW (ref 2.5–4.6)

## 2021-07-30 LAB — MAGNESIUM: Magnesium: 1.4 mg/dL — ABNORMAL LOW (ref 1.7–2.4)

## 2021-07-30 MED ORDER — POTASSIUM PHOSPHATES 15 MMOLE/5ML IV SOLN
30.0000 mmol | Freq: Once | INTRAVENOUS | Status: DC
  Filled 2021-07-30: qty 10

## 2021-07-30 MED ORDER — NICOTINE 21 MG/24HR TD PT24: 21.0000 mg | MEDICATED_PATCH | Freq: Every day | TRANSDERMAL | 0 refills | Status: DC

## 2021-07-30 MED ORDER — ONDANSETRON HCL 4 MG PO TABS: 4.0000 mg | ORAL_TABLET | Freq: Four times a day (QID) | ORAL | 0 refills | Status: DC | PRN

## 2021-07-30 MED ORDER — K PHOS MONO-SOD PHOS DI & MONO 155-852-130 MG PO TABS
500.0000 mg | ORAL_TABLET | Freq: Two times a day (BID) | ORAL | Status: DC
  Administered 2021-07-30: 500 mg via ORAL
  Filled 2021-07-30 (×2): qty 2

## 2021-07-30 MED ORDER — ACETAMINOPHEN 500 MG PO TABS: 1000.0000 mg | ORAL_TABLET | Freq: Four times a day (QID) | ORAL | 0 refills | Status: DC | PRN

## 2021-07-30 MED ORDER — MAGNESIUM SULFATE 4 GM/100ML IV SOLN
4.0000 g | Freq: Once | INTRAVENOUS | Status: DC
  Filled 2021-07-30: qty 100

## 2021-07-30 MED ORDER — MAGNESIUM OXIDE -MG SUPPLEMENT 400 (240 MG) MG PO TABS
800.0000 mg | ORAL_TABLET | Freq: Once | ORAL | Status: AC
  Administered 2021-07-30: 800 mg via ORAL
  Filled 2021-07-30: qty 2

## 2021-07-30 MED ORDER — POLYETHYLENE GLYCOL 3350 17 G PO PACK: 17.0000 g | PACK | Freq: Every day | ORAL | 0 refills | Status: DC

## 2021-07-30 MED ORDER — OXYCODONE-ACETAMINOPHEN 5-325 MG PO TABS
1.0000 | ORAL_TABLET | Freq: Four times a day (QID) | ORAL | 0 refills | Status: DC | PRN
Start: 2021-07-30 — End: 2021-08-22

## 2021-07-30 MED ORDER — CEFDINIR 300 MG PO CAPS: 300.0000 mg | ORAL_CAPSULE | Freq: Two times a day (BID) | ORAL | 0 refills | Status: AC

## 2021-07-30 MED ORDER — PROSOURCE PLUS PO LIQD: 30.0000 mL | Freq: Three times a day (TID) | ORAL | 0 refills | Status: DC

## 2021-07-30 NOTE — Progress Notes (Signed)
Patient c/o nausea and vomiting, stated she vomited her dinner. Patient also c/o 12/10 pain in abdomen, radiating to back. Zofran and hydromorphone given. Will continue to monitor.

## 2021-07-30 NOTE — Discharge Summary (Signed)
Physician Discharge Summary   Patient: Amy Wolfe MRN: [id number] DOB: Jun 07, 1975  Admit date:     07/26/2021  Discharge date: 07/30/21  Discharge Physician: Raiford Noble, DO   PCP: Teodora Medici, DO   Recommendations at discharge:   Follow-up with PCP within 1 to 2 weeks and repeat CBC, CMP, mag, Phos within 1 week Follow-up with gastroenterology for outpatient celiac plexus block  Discharge Diagnoses: Principal Problem:   Acute on chronic pancreatitis ([place]) Active Problems:   AKI (acute kidney injury) ([place])   Hypokalemia   Hypercalcemia   Hyponatremia   Diabetes mellitus due to underlying condition with hyperglycemia, with long-term current use of insulin (HCC)   Portal vein thrombosis   UTI (urinary tract infection)   Tobacco use   Pancreatic duct stones   Moderate protein-calorie malnutrition (HCC)   Protein-calorie malnutrition, severe  Resolved Problems:   * No resolved hospital problems. *  Hospital Course: Amy Wolfe is a 46 y.o. 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  She has known chronic pancreatitis associated pancreatic duct stones with ductal dilatation and pancreatic cyst and was seen by Dr. Shaune Spittle already is planning for pancreatic ERCP and EUS with celiac block on 08/22/2021 with attempts to help manage her uncontrolled chronic pain.  She states that she has been having persistent and worsening epigastric pain which has been ongoing since February and pain radiates to her back and she has frequent nausea vomiting spells and is unable to adequately maintain oral intake.  She states that  she lost over 100 pounds in last 5 months due to poor appetite and had decreased bowel movements due to poor oral intake.  In the ED CT of the abdomen pelvis was done and showed findings again compatible chronic pancreatitis including pancreatic head calcification as well as pancreatic ductal dilatation with no definite inflammatory stranding.  She had one pancreatic head cyst that was decreased in size and more inferior in the uncinate process.  She is given IV Dilaudid as 1700 mL normal saline bolus as well as IV Phenergan and given IV ceftriaxone and the hospital service was asked to admit this patient.  GIs been consulted and they are recommending pain control and fluid hydration with eventual EUS and celiac axis block for pain control with PRP with possible pancreatic stone extraction.  They are recommending focus on her nutrition and recommending a nasoenteric tube but the patient wishes to pursue full liquids and high-calorie supplements currently.  Patient's pain seems to be better controlled and she continues to have some nausea but does not really as bad so GI recommending continue to advance diet as possible and focusing on high-protein high caloric liquid shakes. Urine is growing greater than 100,000 colony-forming units of Klebsiella that is resistant to ampicillin and intermediate to nitrofurantoin.  Antibiotics have de-escalated to cefazolin and will change to p.o. in the morning.  Patient's diet was advanced and she is now tolerating soft diet with some mild nausea but still has some abdominal pain.  Patient's pain is relatively well controlled with oral medications and she was stable for discharge home today and she is much improved from the time of admission.  She will need to follow-up with  her PCP and GI clinic in follow-up for outpatient celiac plexus block within 1 to 2 weeks.  It was recommended to her that if she continues to have pain that she would likely need to see a pain clinic and  referral can be made by her primary care physician.  Assessment and Plan: Acute on chronic pancreatitis [facility]) Pancreatic duct stones with chronic pancreatic duct dilation Presenting with progressive and worsening pain, poor oral intake, s/p, weight loss due to acute on chronic alcohol associated pancreatitis.  She says she has remained sober.  Home Percocet not providing significant relief.  Planned for ERCP and EUS celiac block on 8/21 w/ [facility], Dr. Rush Landmark. IV fluid hydration now stopped -Continue IV antiemetics and analgesics as needed -GI consulted and recommending continuing supportive care for now and pain control -We will start weaning her IV pain medications and changed to IV hydromorphone from every 2 as needed to every 4 and increase the dose of her oxycodone from 5 to 10 mg -Patient's diet was advanced from a full liquid diet to a soft diet; if she tolerates her soft diet for dinner she can be discharged in the a.m.   AKI (acute kidney injury) ([place]) -Prerenal due to volume depletion from GI losses.   -Creatinine 2.35 on admission compared to baseline 0.7-0.8. -Continue IV fluid hydration as above -Now her BUNs/creatinine is normalizing at 23/0.73 yesterday and today is 13/0.67 -Avoid nephrotoxic medications, contrast dyes, hypotension and dehydration to ensure adequate renal perfusion and renally adjust medications -Repeat labs in a.m. -Avoid NSAIDs -Continue to monitor and trend renal function carefully and repeat CMP in a.m.   Hyponatremia -Mild in setting of volume depletion and hyperglycemia. -Continue IV fluids and repeat labs in a.m. -Sodium is now improved to 139 again   Hypercalcemia -Secondary to volume depletion.  Calcium 11.6 on admission with albumin 4.8.  Now calcium is 8 point -IV fluid hydration has now stopped and will repeat in the a.m.   Hypomagnesemia -Patient's mag level is now 1.9 -Replete with IV mag sulfate 2 g yesterday -Continue to monitor  and treat as necessary -Repeat mag in the a.m.   Hypokalemia -Secondary to GI losses.  IV supplementation ordered. -Patient's potassium is now 3.6 -Continue monitor replete and repeat CMP in a.m.   Diabetes mellitus due to underlying condition with hyperglycemia, with long-term current use of insulin (HCC) -Hyperglycemic without evidence of DKA/HHS.  Start on SSI q4h while n.p.o. and adjust as needed. -CBGs now ranging from 78-208   Klebsiella UTI (urinary tract infection) -Continued IV ceftriaxone while hospitalized have now de-escalated this to IV cefazolin and will change to p.o. at the time of discharge -Urinalysis showed a hazy appearance with moderate hemoglobin, 5 ketones, moderate leukocytes, positive nitrites, 30 protein, many bacteria, greater than 50 WBCs and 6-10 RBCs per high-power field with urine culture showing Klebsiella and greater than 100,000 colony-forming units that is resistant to ampicillin and intermediate to nitrofurantoin   Portal vein thrombosis -Continue Xarelto.   Protein-calorie malnutrition, severe -Nutrition Status: Nutrition Problem: Severe Malnutrition Etiology: chronic illness (pancreatitis) Signs/Symptoms: percent weight loss, energy intake < or equal to 75% for > or equal to 1 month Interventions: Boost Breeze, MVI, Refer to RD note for recommendations    Pancreatic duct stones -GI Consulted and possible Stone Extraction later next month -Current plans are in place for celiac plexus block and ERCP with Dr. Rush Landmark in the third week of August   Tobacco use -Reports ongoing  tobacco use.  Nicotine patch provided smoking cessation counseling given  Nutrition Documentation    [place] ED to Hosp-Admission (Discharged) from 07/26/2021 in [facility] 5 EAST MEDICAL UNIT  Nutrition Problem Severe Malnutrition  Etiology chronic illness  [pancreatitis]  Nutrition Goal Patient will meet greater than or equal to 90% of their  needs  Interventions Boost Breeze, MVI, Refer to RD note for recommendations      Pain control - [facility] Controlled Substance Reporting System database was reviewed. and patient was instructed, not to drive, operate heavy machinery, perform activities at heights, swimming or participation in water activities or provide baby-sitting services while on Pain, Sleep and Anxiety Medications; until their outpatient Physician has advised to do so again. Also recommended to not to take more than prescribed Pain, Sleep and Anxiety Medications.   Consultants: Gastroenterology  Procedures performed: None  Disposition: Home Diet recommendation:  Cardiac and Carb modified diet DISCHARGE MEDICATION: Allergies as of 07/30/2021   No Known Allergies      Medication List     STOP taking these medications    ondansetron 4 MG disintegrating tablet Commonly known as: ZOFRAN-ODT   promethazine 12.5 MG tablet Commonly known as: PHENERGAN       TAKE these medications    (feeding supplement) PROSource Plus liquid Take 30 mLs by mouth 3 (three) times daily between meals.   Accu-Chek Guide test strip Generic drug: glucose blood USE UP TO 4 TIMES DAILY AS DIRECTED   Accu-Chek Softclix Lancets lancets USE UP TO 4 TIMES DAILY AS DIRECTED   acetaminophen 500 MG tablet Commonly known as: TYLENOL Take 2 tablets (1,000 mg total) by mouth every 6 (six) hours as needed for mild pain, fever or headache (or Fever >/= 101). DO Not Take if taking the Percocet's as well   blood glucose meter kit and supplies Dispense based on patient and insurance preference. Use up to four times daily as directed. (FOR ICD-10 E10.9, E11.9).   blood glucose meter kit and supplies Kit Dispense based on patient and insurance preference. Use up to four times daily as directed.   cefdinir 300 MG capsule Commonly known as: OMNICEF Take 1 capsule (300 mg total) by mouth 2 (two) times daily for 2 days.   Levemir  FlexPen 100 UNIT/ML FlexPen Generic drug: insulin detemir Inject 5 Units into the skin daily. What changed: how much to take   lipase/protease/amylase 36000 UNITS Cpep capsule Commonly known as: Creon Take 1-2 capsules (36,000-72,000 Units total) by mouth See admin instructions. Take 2 capsules (72000u) by mouth three times daily with meals and take 1 capsule (36000u) by mouth twice daily with snacks   Multivitamin Adult Chew Chew 1 each by mouth daily.   nicotine 21 mg/24hr patch Commonly known as: NICODERM CQ - dosed in mg/24 hours Place 1 patch (21 mg total) onto the skin daily. Start taking on: July 31, 2021   ondansetron 4 MG tablet Commonly known as: ZOFRAN Take 1 tablet (4 mg total) by mouth every 6 (six) hours as needed for nausea.   oxyCODONE-acetaminophen 5-325 MG tablet Commonly known as: PERCOCET/ROXICET Take 1 tablet by mouth every 6 (six) hours as needed for severe pain.   pantoprazole 40 MG tablet Commonly known as: PROTONIX Take 1 tablet (40 mg total) by mouth 2 (two) times daily before a meal.   Pen Needles 30G X 8 MM Misc 1 each by Does not apply route daily.   polyethylene glycol 17 g packet  Commonly known as: MIRALAX / GLYCOLAX Take 17 g by mouth daily. Start taking on: July 31, 2021   rivaroxaban 20 MG Tabs tablet Commonly known as: Xarelto Take 1 tablet (20 mg total) by mouth daily with supper.        Discharge Exam: Filed Weights   07/26/21 1359  Weight: 58.1 kg   Vitals:   07/29/21 2015 07/30/21 0434  BP: 120/79 98/67  Pulse: 85 83  Resp: 18 16  Temp: 98.4 F (36.9 C) 98.1 F (36.7 C)  SpO2: 100% 99%   Examination: Physical Exam:  Constitutional: WN/WD, NAD and appears calm and comfortable Eyes: PERRL, lids and conjunctivae normal, sclerae anicteric  ENMT: External Ears, Nose appear normal. Grossly normal hearing. Mucous membranes are moist. Posterior pharynx clear of any exudate or lesions. Normal dentition.  Neck: Appears  normal, supple, no cervical masses, normal ROM, no appreciable thyromegaly Respiratory: Clear to auscultation bilaterally, no wheezing, rales, rhonchi or crackles. Normal respiratory effort and patient is not tachypenic. No accessory muscle use.  Cardiovascular: RRR, no murmurs / rubs / gallops. S1 and S2 auscultated. No extremity edema. 2+ pedal pulses. No carotid bruits.  Abdomen: Soft, non-tender, non-distended. No masses palpated. No appreciable hepatosplenomegaly. Bowel sounds positive.  GU: Deferred. Musculoskeletal: No clubbing / cyanosis of digits/nails. No joint deformity upper and lower extremities. Good ROM, no contractures. Normal strength and muscle tone.  Skin: No rashes, lesions, ulcers. No induration; Warm and dry.  Neurologic: CN 2-12 grossly intact with no focal deficits. Sensation intact in all 4 Extremities, DTR normal. Strength 5/5 in all 4. Romberg sign cerebellar reflexes not assessed.  Psychiatric: Normal judgment and insight. Alert and oriented x 3. Normal mood and appropriate affect.    Condition at discharge: stable  The results of significant diagnostics from this hospitalization (including imaging, microbiology, ancillary and laboratory) are listed below for reference.   Imaging Studies: CT Abdomen Pelvis Wo Contrast  Result Date: 07/26/2021 CLINICAL DATA:  Suspected pancreatitis. Abdominal pain. History of pancreatitis. Patient reports postop surgery soon with the pain is out of control. EXAM: CT ABDOMEN AND PELVIS WITHOUT CONTRAST TECHNIQUE: Multidetector CT imaging of the abdomen and pelvis was performed following the standard protocol without IV contrast. RADIATION DOSE REDUCTION: This exam was performed according to the departmental dose-optimization program which includes automated exposure control, adjustment of the mA and/or kV according to patient size and/or use of iterative reconstruction technique. COMPARISON:  AP chest 06/01/2021; CT abdomen and pelvis with  contrast 02/21/2021 and 09/01/2020 FINDINGS: Lower chest: 4 mm lateral right lower lobe pulmonary nodule is unchanged from 03/30/2019 and benign (axial series 4, image 16). Lack of intravenous contrast limits evaluation of the solid abdominal and pelvic organ parenchyma. The following findings are made within this limitation. Hepatobiliary: Smooth liver contours. No gross liver lesion is seen. The gallbladder is grossly unremarkable. Pancreas: There again numerous calcifications within the pancreatic head and uncinate process likely the sequela of chronic calcific pancreatitis. A fluid attenuation cyst is again seen within the pancreatic head. This measures up to approximately 1.4 x 1.4 cm (axial series 2, image 32) mildly decreased in size from 16 x 17 mm on 02/21/2021. Within the more inferior pancreas uncinate process there is a fluid density 2.4 x 1.9 cm cyst that is slightly increased in size from 1.3 by 1.5 cm previously (axial series 2, image 35). The main pancreatic duct is again diffusely prominent measuring up to approximately 8 mm. Spleen: Grossly unremarkable. Adrenals/Urinary Tract: Normal adrenals.  No renal stone or hydronephrosis. Within limitations of lack of intravenous contrast no contour deforming renal mass is seen. No renal or ureteral stone is seen. There is streak artifact from bilateral metallic coils within the pelvis obscuring portions of the distal ureters and the posterolateral aspect of the urinary bladder. No focal urinary bladder wall thickening is seen. Stomach/Bowel: Mild proximal sigmoid diverticulosis. No inflammatory changes to indicate acute diverticulitis. Normal retrocecal appendix (coronal series 5, image 66). The terminal ileum is unremarkable. No dilated loops of bowel to indicate bowel obstruction. Vascular/Lymphatic: No abdominal aortic aneurysm. No mesenteric retroperitoneal or pelvic pathologically enlarged lymph nodes by CT criteria. Reproductive: The uterus appears to  be surgically absent. Bilateral adnexal postsurgical metallic coils are visualized. The bilateral ovarian tissue is again grossly unremarkable. Other: Small fat containing umbilical hernia. No free air or free fluid is seen within the abdomen or pelvis. Musculoskeletal: Trace grade 1 anterolisthesis of L4 on L5 is unchanged from prior. No acute skeletal abnormality. IMPRESSION: 1. Compared to 02/21/2021, there are findings again compatible with chronic pancreatitis including pancreatic head calcifications and pancreatic ductal dilatation. No definite inflammatory stranding to indicate acute pancreatitis. 2. One pancreatic head cyst is slightly decreased in size now measuring 13 mm in axial image 32. A more inferior uncinate process cyst measures up to 2.3 cm mildly increased from 1.5 cm previously. Electronically Signed   By: Yvonne Kendall M.D.   On: 07/26/2021 18:07    Microbiology: Results for orders placed or performed during the hospital encounter of 07/26/21  Urine Culture     Status: Abnormal   Collection Time: 07/26/21  8:40 PM   Specimen: Urine, Clean Catch  Result Value Ref Range Status   Specimen Description   Final    URINE, CLEAN CATCH Performed at [facility], [place] [address] [place] [address], [place] [address]    Special Requests   Final    NONE Performed at [facility], [place] [address], [place], [place] [address]    Culture >=100,000 COLONIES/mL KLEBSIELLA PNEUMONIAE (A)  Final   Report Status 07/28/2021 FINAL  Final   Organism ID, Bacteria KLEBSIELLA PNEUMONIAE (A)  Final      Susceptibility   Klebsiella pneumoniae - MIC*    AMPICILLIN >=32 RESISTANT Resistant     CEFAZOLIN <=4 SENSITIVE Sensitive     CEFEPIME <=0.12 SENSITIVE Sensitive     CEFTRIAXONE <=0.25 SENSITIVE Sensitive     CIPROFLOXACIN <=0.25 SENSITIVE Sensitive     GENTAMICIN <=1 SENSITIVE Sensitive     IMIPENEM <=0.25 SENSITIVE Sensitive     NITROFURANTOIN 64 INTERMEDIATE  Intermediate     TRIMETH/SULFA <=20 SENSITIVE Sensitive     AMPICILLIN/SULBACTAM 4 SENSITIVE Sensitive     PIP/TAZO <=4 SENSITIVE Sensitive     * >=100,000 COLONIES/mL KLEBSIELLA PNEUMONIAE    Labs: CBC: Recent Labs  Lab 07/26/21 1402 07/27/21 0515 07/28/21 0542 07/29/21 0546 07/30/21 0538  WBC 6.2 5.6 4.6 5.5 4.6  NEUTROABS  --   --  1.7 2.6 1.9  HGB 19.1* 16.0* 14.2 14.4 12.7  HCT 57.4* 49.3* 44.6 45.7 39.7  MCV 82.9 84.3 85.1 86.1 85.7  PLT 405* 280 260 274 254   Basic Metabolic Panel: Recent Labs  Lab 07/26/21 1402 07/26/21 2027 07/27/21 0515 07/28/21 0542 07/29/21 0546 07/30/21 0538  NA 131*  --  136 139 139 138  K 2.9*  --  3.2* 3.1* 3.6 4.0  CL 90*  --  101 102 103 103  CO2  22  --  _0 GLUCOSE 281*  --  101* 107* 64* 155*  BUN 52*  --  39* 23* 13 11  CREATININE 2.35*  --  1.04* 0.73 0.67 0.56  CALCIUM 11.6*  --  9.8 10.2 8.9 7.6*  MG  --  2.1 1.8 1.6* 1.9 1.4*  PHOS  --   --   --  3.0 2.7 1.6*   Liver Function Tests: Recent Labs  Lab 07/26/21 1402 07/27/21 0515 07/28/21 0542 07/29/21 0546 07/30/21 0538  AST _1 ALT _2 ALKPHOS 92 67 60 65 46  BILITOT 0.7 0.7 0.3 0.2* 0.2*  PROT 9.4* 7.3 6.2* 7.0 5.1*  ALBUMIN 4.8 4.0 3.3* 3.7 2.7*   CBG: Recent Labs  Lab 07/29/21 2018 07/29/21 2339 07/30/21 0431 07/30/21 0728 07/30/21 1135  GLUCAP 170* 108* 161* 77 146*    Discharge time spent: greater than 30 minutes.  Signed: Raiford Noble, DO [facility] 07/30/2021

## 2021-07-30 NOTE — Progress Notes (Signed)
Patient will be going home with her mother. Belongings were returned. Education on medication will be provided.

## 2021-08-04 ENCOUNTER — Other Ambulatory Visit: Payer: Self-pay | Admitting: Internal Medicine

## 2021-08-04 ENCOUNTER — Ambulatory Visit: Payer: Self-pay

## 2021-08-04 DIAGNOSIS — B379 Candidiasis, unspecified: Secondary | ICD-10-CM

## 2021-08-04 MED ORDER — FLUCONAZOLE 150 MG PO TABS: 150.0000 mg | ORAL_TABLET | Freq: Once | ORAL | 0 refills | Status: AC

## 2021-08-04 NOTE — Telephone Encounter (Signed)
    Chief Complaint: Vaginal itching."I have a yeast infection from the antibiotics I was on in the hospital." Requesting medication be called in. Declines OV. Symptoms: Itching Frequency: Last week Pertinent Negatives: Patient denies No discharge. Has tried OTC. Disposition: '[]'$ ED /'[]'$ Urgent Care (no appt availability in office) / '[]'$ Appointment(In office/virtual)/ '[]'$  [place] [facility]/ '[]'$ Home Care/ '[]'$ Refused Recommended Disposition /'[]'$ [place] [facility]/ '[x]'$  Follow-up with PCP Additional Notes: Please advise pt.  Answer Assessment - Initial Assessment Questions 1. SYMPTOM: "What's the main symptom you're concerned about?" (e.g., pain, itching, dryness)     Itching 2. LOCATION: "Where is the   located?" (e.g., inside/outside, left/right)     Outside 3. ONSET: "When did the    start?"     Last week 4. PAIN: "Is there any pain?" If Yes, ask: "How bad is it?" (Scale: 1-10; mild, moderate, severe)   -  MILD (1-3): Doesn't interfere with normal activities.    -  MODERATE (4-7): Interferes with normal activities (e.g., work or school) or awakens from sleep.     -  SEVERE (8-10): Excruciating pain, unable to do any normal activities.     None 5. ITCHING: "Is there any itching?" If Yes, ask: "How bad is it?" (Scale: 1-10; mild, moderate, severe)     Severe 6. CAUSE: "What do you think is causing the discharge?" "Have you had the same problem before? What happened then?"     Yeast 7. OTHER SYMPTOMS: "Do you have any other symptoms?" (e.g., fever, itching, vaginal bleeding, pain with urination, injury to genital area, vaginal foreign body)     No discharge 8. PREGNANCY: "Is there any chance you are pregnant?" "When was your last menstrual period?"     No  Protocols used: Vaginal Symptoms-A-AH

## 2021-08-04 NOTE — Telephone Encounter (Signed)
Pt.notified

## 2021-08-05 NOTE — Telephone Encounter (Signed)
Requested Prescriptions  Pending Prescriptions Disp Refills  . Accu-Chek Softclix Lancets lancets [Pharmacy Med Name: [place] 100 each 2    Sig: USE TO [place] BLOOD SUGAR UP TO 4 TIMES DAILY AS DIRECTED     Endocrinology: Diabetes - Testing Supplies Passed - 08/04/2021  1:17 PM      Passed - Valid encounter within last 12 months    Recent Outpatient Visits          4 months ago Diabetes mellitus due to underlying condition with hyperglycemia, without long-term current use of insulin [facility])   [place], DO   5 months ago Alcohol-induced chronic pancreatitis [facility])   [place], DO   5 months ago Alcohol-induced chronic pancreatitis [facility])   [place], DO   2 years ago Essential hypertension   [place], Astrid Divine, [place]             . ACCU-CHEK GUIDE test strip [Pharmacy Med Name: Accu-Chek Guide In Vitro Strip] 100 each 2    Sig: USE TO CHECK BLOOD SUGAR UP TO 4 TIMES DAILY AS DIRECTED     Endocrinology: Diabetes - Testing Supplies Passed - 08/04/2021  1:17 PM      Passed - Valid encounter within last 12 months    Recent Outpatient Visits          4 months ago Diabetes mellitus due to underlying condition with hyperglycemia, without long-term current use of insulin [facility])   [place], DO   5 months ago Alcohol-induced chronic pancreatitis [facility])   [facility] Teodora Medici, DO   5 months ago Alcohol-induced chronic pancreatitis [facility])   [place], DO   2 years ago Essential hypertension   [place], Astrid Divine, [place]

## 2021-08-15 ENCOUNTER — Encounter (HOSPITAL_COMMUNITY): Payer: Self-pay | Admitting: Gastroenterology

## 2021-08-22 ENCOUNTER — Telehealth: Payer: Self-pay

## 2021-08-22 ENCOUNTER — Ambulatory Visit (HOSPITAL_COMMUNITY): Payer: Medicaid Other | Admitting: Anesthesiology

## 2021-08-22 ENCOUNTER — Encounter (HOSPITAL_COMMUNITY)
Admission: RE | Disposition: A | Discharge status: Home / Self Care | Payer: Self-pay | Source: Home / Self Care | Attending: Gastroenterology

## 2021-08-22 ENCOUNTER — Encounter (HOSPITAL_COMMUNITY): Payer: Self-pay | Admitting: Gastroenterology

## 2021-08-22 ENCOUNTER — Other Ambulatory Visit: Payer: Self-pay

## 2021-08-22 ENCOUNTER — Ambulatory Visit (HOSPITAL_COMMUNITY)
Admission: RE | Admit: 2021-08-22 | Discharge: 2021-08-22 | Disposition: A | Discharge status: Home / Self Care | Payer: Medicaid Other | Attending: Gastroenterology | Admitting: Gastroenterology

## 2021-08-22 ENCOUNTER — Ambulatory Visit (HOSPITAL_COMMUNITY): Payer: Medicaid Other

## 2021-08-22 ENCOUNTER — Ambulatory Visit (HOSPITAL_BASED_OUTPATIENT_CLINIC_OR_DEPARTMENT_OTHER): Payer: Medicaid Other | Admitting: Anesthesiology

## 2021-08-22 DIAGNOSIS — K862 Cyst of pancreas: Secondary | ICD-10-CM | POA: Insufficient documentation

## 2021-08-22 DIAGNOSIS — E119 Type 2 diabetes mellitus without complications: Secondary | ICD-10-CM | POA: Insufficient documentation

## 2021-08-22 DIAGNOSIS — K8689 Other specified diseases of pancreas: Secondary | ICD-10-CM

## 2021-08-22 DIAGNOSIS — R590 Localized enlarged lymph nodes: Secondary | ICD-10-CM | POA: Diagnosis not present

## 2021-08-22 DIAGNOSIS — K8010 Calculus of gallbladder with chronic cholecystitis without obstruction: Secondary | ICD-10-CM | POA: Diagnosis not present

## 2021-08-22 DIAGNOSIS — K838 Other specified diseases of biliary tract: Secondary | ICD-10-CM

## 2021-08-22 DIAGNOSIS — G8929 Other chronic pain: Secondary | ICD-10-CM | POA: Insufficient documentation

## 2021-08-22 DIAGNOSIS — K8020 Calculus of gallbladder without cholecystitis without obstruction: Secondary | ICD-10-CM

## 2021-08-22 DIAGNOSIS — K860 Alcohol-induced chronic pancreatitis: Secondary | ICD-10-CM

## 2021-08-22 DIAGNOSIS — R933 Abnormal findings on diagnostic imaging of other parts of digestive tract: Secondary | ICD-10-CM | POA: Insufficient documentation

## 2021-08-22 DIAGNOSIS — K861 Other chronic pancreatitis: Secondary | ICD-10-CM | POA: Insufficient documentation

## 2021-08-22 DIAGNOSIS — K2980 Duodenitis without bleeding: Secondary | ICD-10-CM | POA: Insufficient documentation

## 2021-08-22 DIAGNOSIS — K859 Acute pancreatitis without necrosis or infection, unspecified: Secondary | ICD-10-CM | POA: Diagnosis not present

## 2021-08-22 DIAGNOSIS — Z7901 Long term (current) use of anticoagulants: Secondary | ICD-10-CM | POA: Diagnosis not present

## 2021-08-22 DIAGNOSIS — K2990 Gastroduodenitis, unspecified, without bleeding: Secondary | ICD-10-CM

## 2021-08-22 DIAGNOSIS — K2970 Gastritis, unspecified, without bleeding: Secondary | ICD-10-CM | POA: Insufficient documentation

## 2021-08-22 DIAGNOSIS — K831 Obstruction of bile duct: Secondary | ICD-10-CM

## 2021-08-22 DIAGNOSIS — Z794 Long term (current) use of insulin: Secondary | ICD-10-CM | POA: Diagnosis not present

## 2021-08-22 DIAGNOSIS — F419 Anxiety disorder, unspecified: Secondary | ICD-10-CM

## 2021-08-22 HISTORY — PX: ENDOSCOPIC RETROGRADE CHOLANGIOPANCREATOGRAPHY (ERCP) WITH PROPOFOL: SHX5810

## 2021-08-22 HISTORY — PX: PANCREATIC STENT PLACEMENT: SHX5539

## 2021-08-22 HISTORY — PX: NEUROLYTIC CELIAC PLEXUS: SHX5435

## 2021-08-22 HISTORY — PX: EUS: SHX5427

## 2021-08-22 HISTORY — PX: SPHINCTEROTOMY: SHX5544

## 2021-08-22 HISTORY — PX: BILIARY BRUSHING: SHX6843

## 2021-08-22 HISTORY — PX: REMOVAL OF STONES: SHX5545

## 2021-08-22 HISTORY — PX: ESOPHAGOGASTRODUODENOSCOPY (EGD) WITH PROPOFOL: SHX5813

## 2021-08-22 LAB — GLUCOSE, CAPILLARY: Glucose-Capillary: 151 mg/dL — ABNORMAL HIGH (ref 70–99)

## 2021-08-22 SURGERY — ESOPHAGEAL ENDOSCOPIC ULTRASOUND (EUS) RADIAL
Anesthesia: General

## 2021-08-22 MED ORDER — BUPIVACAINE HCL 0.25 % IJ SOLN
20.0000 mL | INTRAMUSCULAR | Status: DC
  Filled 2021-08-22: qty 20

## 2021-08-22 MED ORDER — SUGAMMADEX SODIUM 200 MG/2ML IV SOLN
INTRAVENOUS | Status: DC | PRN
  Administered 2021-08-22: 150 mg via INTRAVENOUS

## 2021-08-22 MED ORDER — TRIAMCINOLONE ACETONIDE 40 MG/ML IJ SUSP: 20.0000 mL | INTRAMUSCULAR | Status: DC

## 2021-08-22 MED ORDER — SODIUM CHLORIDE 0.9 % IV SOLN: INTRAVENOUS | Status: DC

## 2021-08-22 MED ORDER — DIPHENHYDRAMINE HCL 50 MG/ML IJ SOLN
INTRAMUSCULAR | Status: DC | PRN
  Administered 2021-08-22: 12.5 mg via INTRAVENOUS

## 2021-08-22 MED ORDER — DEXAMETHASONE SODIUM PHOSPHATE 4 MG/ML IJ SOLN
INTRAMUSCULAR | Status: DC | PRN
  Administered 2021-08-22: 8 mg via INTRAVENOUS

## 2021-08-22 MED ORDER — LACTATED RINGERS IV SOLN: INTRAVENOUS | Status: DC

## 2021-08-22 MED ORDER — ONDANSETRON HCL 4 MG/2ML IJ SOLN
INTRAMUSCULAR | Status: AC
  Filled 2021-08-22: qty 2

## 2021-08-22 MED ORDER — ONDANSETRON HCL 4 MG/2ML IJ SOLN
4.0000 mg | Freq: Once | INTRAMUSCULAR | Status: AC
Start: 2021-08-22 — End: 2021-08-22
  Administered 2021-08-22: 4 mg via INTRAVENOUS

## 2021-08-22 MED ORDER — CIPROFLOXACIN IN D5W 400 MG/200ML IV SOLN
INTRAVENOUS | Status: AC
  Filled 2021-08-22: qty 200

## 2021-08-22 MED ORDER — PANCRELIPASE (LIP-PROT-AMYL) 36000-114000 UNITS PO CPEP: 36000.0000 [IU] | ORAL_CAPSULE | ORAL | 6 refills | Status: DC

## 2021-08-22 MED ORDER — LABETALOL HCL 5 MG/ML IV SOLN
INTRAVENOUS | Status: AC
  Filled 2021-08-22: qty 4

## 2021-08-22 MED ORDER — LIDOCAINE 2% (20 MG/ML) 5 ML SYRINGE
INTRAMUSCULAR | Status: DC | PRN
  Administered 2021-08-22: 60 mg via INTRAVENOUS

## 2021-08-22 MED ORDER — PANTOPRAZOLE SODIUM 40 MG PO TBEC
40.0000 mg | DELAYED_RELEASE_TABLET | Freq: Two times a day (BID) | ORAL | 6 refills | Status: DC
  Filled 2021-09-23: qty 60, 30d supply, fill #0

## 2021-08-22 MED ORDER — HYDROMORPHONE HCL 1 MG/ML IJ SOLN
INTRAMUSCULAR | Status: AC
  Filled 2021-08-22: qty 1

## 2021-08-22 MED ORDER — TRIAMCINOLONE ACETONIDE 40 MG/ML IJ SUSP
INTRAMUSCULAR | Status: DC | PRN
  Administered 2021-08-22: 22 mL via PERINEURAL

## 2021-08-22 MED ORDER — ONDANSETRON HCL 4 MG/2ML IJ SOLN
INTRAMUSCULAR | Status: DC | PRN
  Administered 2021-08-22: 4 mg via INTRAVENOUS

## 2021-08-22 MED ORDER — BUPIVACAINE HCL (PF) 0.25 % IJ SOLN
20.0000 mL | INTRAMUSCULAR | Status: DC
  Filled 2021-08-22: qty 20

## 2021-08-22 MED ORDER — MIDAZOLAM HCL 5 MG/5ML IJ SOLN
INTRAMUSCULAR | Status: DC | PRN
  Administered 2021-08-22: 2 mg via INTRAVENOUS

## 2021-08-22 MED ORDER — TRIAMCINOLONE ACETONIDE 40 MG/ML IJ SUSP
INTRAMUSCULAR | Status: AC
  Filled 2021-08-22: qty 2

## 2021-08-22 MED ORDER — GLUCAGON HCL RDNA (DIAGNOSTIC) 1 MG IJ SOLR
INTRAMUSCULAR | Status: DC | PRN
  Administered 2021-08-22 (×4): .25 mg via INTRAVENOUS

## 2021-08-22 MED ORDER — SCOPOLAMINE 1 MG/3DAYS TD PT72
MEDICATED_PATCH | TRANSDERMAL | Status: AC
  Filled 2021-08-22: qty 1

## 2021-08-22 MED ORDER — FENTANYL CITRATE (PF) 100 MCG/2ML IJ SOLN
INTRAMUSCULAR | Status: AC
  Filled 2021-08-22: qty 2

## 2021-08-22 MED ORDER — PROPOFOL 500 MG/50ML IV EMUL
INTRAVENOUS | Status: AC
  Filled 2021-08-22: qty 50

## 2021-08-22 MED ORDER — DEXMEDETOMIDINE (PRECEDEX) IN NS 20 MCG/5ML (4 MCG/ML) IV SYRINGE
PREFILLED_SYRINGE | INTRAVENOUS | Status: DC | PRN
  Administered 2021-08-22: 20 ug via INTRAVENOUS

## 2021-08-22 MED ORDER — HYDROMORPHONE HCL 1 MG/ML IJ SOLN
0.5000 mg | Freq: Once | INTRAMUSCULAR | Status: AC
  Administered 2021-08-22: 0.5 mg via INTRAVENOUS

## 2021-08-22 MED ORDER — INDOMETHACIN 50 MG RE SUPP
RECTAL | Status: AC
  Filled 2021-08-22: qty 2

## 2021-08-22 MED ORDER — OXYCODONE-ACETAMINOPHEN 5-325 MG PO TABS
1.0000 | ORAL_TABLET | ORAL | 0 refills | Status: DC | PRN
Start: 2021-08-22 — End: 2021-10-17

## 2021-08-22 MED ORDER — DICLOFENAC SUPPOSITORY 100 MG
RECTAL | Status: AC
  Filled 2021-08-22: qty 1

## 2021-08-22 MED ORDER — LABETALOL HCL 5 MG/ML IV SOLN
10.0000 mg | INTRAVENOUS | Status: DC | PRN
  Administered 2021-08-22: 10 mg via INTRAVENOUS

## 2021-08-22 MED ORDER — FENTANYL CITRATE (PF) 100 MCG/2ML IJ SOLN
INTRAMUSCULAR | Status: DC | PRN
  Administered 2021-08-22 (×2): 50 ug via INTRAVENOUS

## 2021-08-22 MED ORDER — GLUCAGON HCL RDNA (DIAGNOSTIC) 1 MG IJ SOLR
INTRAMUSCULAR | Status: AC
  Filled 2021-08-22: qty 1

## 2021-08-22 MED ORDER — IOHEXOL 300 MG/ML  SOLN
INTRAMUSCULAR | Status: DC | PRN
  Administered 2021-08-22: 7 mL via ORAL

## 2021-08-22 MED ORDER — ROCURONIUM BROMIDE 10 MG/ML (PF) SYRINGE
PREFILLED_SYRINGE | INTRAVENOUS | Status: DC | PRN
  Administered 2021-08-22: 50 mg via INTRAVENOUS

## 2021-08-22 MED ORDER — PROPOFOL 10 MG/ML IV BOLUS
INTRAVENOUS | Status: DC | PRN
  Administered 2021-08-22: 140 mg via INTRAVENOUS

## 2021-08-22 MED ORDER — SCOPOLAMINE 1 MG/3DAYS TD PT72
1.0000 | MEDICATED_PATCH | Freq: Once | TRANSDERMAL | Status: DC
  Administered 2021-08-22: 1.5 mg via TRANSDERMAL

## 2021-08-22 MED ORDER — SUCCINYLCHOLINE CHLORIDE 200 MG/10ML IV SOSY
PREFILLED_SYRINGE | INTRAVENOUS | Status: DC | PRN
  Administered 2021-08-22: 100 mg via INTRAVENOUS

## 2021-08-22 MED ORDER — MIDAZOLAM HCL 2 MG/2ML IJ SOLN
INTRAMUSCULAR | Status: AC
  Filled 2021-08-22: qty 2

## 2021-08-22 MED ORDER — HYDROMORPHONE HCL 1 MG/ML IJ SOLN
INTRAMUSCULAR | Status: DC | PRN
  Administered 2021-08-22: 1 mg via INTRAVENOUS

## 2021-08-22 MED ORDER — SODIUM CHLORIDE 0.9 % IV SOLN
INTRAVENOUS | Status: DC | PRN
  Administered 2021-08-22: 25 mL

## 2021-08-22 MED ORDER — INDOMETHACIN 50 MG RE SUPP
RECTAL | Status: DC | PRN
  Administered 2021-08-22: 100 mg via RECTAL

## 2021-08-22 MED ORDER — CIPROFLOXACIN IN D5W 400 MG/200ML IV SOLN
INTRAVENOUS | Status: DC | PRN
  Administered 2021-08-22: 400 mg via INTRAVENOUS

## 2021-08-22 MED ORDER — PROPOFOL 500 MG/50ML IV EMUL
INTRAVENOUS | Status: DC | PRN
  Administered 2021-08-22: 150 ug/kg/min via INTRAVENOUS

## 2021-08-22 NOTE — Op Note (Signed)
[facility] Patient Name: Amy Wolfe Procedure Date: 08/22/2021 MRN: [id number] Attending MD: Justice Britain , MD Date of Birth: 1975-07-24 CSN: [id number] Age: 46 Admit Type: Outpatient Procedure:                ERCP Indications:              Generalized abdominal pain, Chronic recurrent                            pancreatitis, For therapy of chronic recurrent                            pancreatitis, Ethanol induced chronic recurrent                            pancreatitis, Pancreatic duct stone Providers:                Justice Britain, MD, Carlyn Reichert, RN, Luan Moore, Technician Referring MD:             Harl Bowie, MD Medicines:                General Anesthesia, Cipro 400 mg IV, Glucagon 1 mg                            IV, Indomethacin 093 mg PR Complications:            No immediate complications. Estimated Blood Loss:     Estimated blood loss was minimal. Procedure:                Pre-Anesthesia Assessment:                           - Prior to the procedure, a History and Physical                            was performed, and patient medications and                            allergies were reviewed. The patient's tolerance of                            previous anesthesia was also reviewed. The risks                            and benefits of the procedure and the sedation                            options and risks were discussed with the patient.                            All questions were answered, and informed consent  was obtained. Prior Anticoagulants: The patient has                            taken Xarelto (rivaroxaban), last dose was 5 days                            prior to procedure. ASA Grade Assessment: III - A                            patient with severe systemic disease. After                            reviewing the risks and benefits, the patient was                             deemed in satisfactory condition to undergo the                            procedure.                           After obtaining informed consent, the scope was                            passed under direct vision. Throughout the                            procedure, the patient's blood pressure, pulse, and                            oxygen saturations were monitored continuously. The                            [id number]-Q190V ([id number]) Olympus duodenoscope was                            introduced through the mouth, and used to inject                            contrast into and used to inject contrast into the                            bile duct and ventral pancreatic duct. The ERCP was                            technically difficult and complex due to difficulty                            passing guidewires through pancreatic ductal                            stenosis. Successful completion of the procedure  was aided by performing the maneuvers documented                            (below) in this report. The patient tolerated the                            procedure. Scope In: Scope Out: Findings:      The scout film was normal.      The esophagus was successfully intubated under direct vision without       detailed examination of the pharynx, larynx, and associated structures,       and upper GI tract. The major papilla was congested.      A short 0.035 inch Soft Jagwire was passed into the ventral pancreatic       duct but deep cannulation was not possible with the wire, it kept       hitting a narrowing vs presumed stone. The ventral pancreatic duct was       then superficially cannulated with the Hydratome sphincterotome.       Contrast was injected. I personally interpreted the pancreatic duct       images. The flow of contrast through the ducts was poor. Image quality       was adequate. Contrast extended to the distal pancreatic duct but I        could not visualize the neck/body/tail. Opacification of the ventral       pancreatic duct in the head of the pancreas was successful as noted. The       ventral pancreatic duct in the head of the pancreas contained a large       filling defect suggestive of the stone noted on the EUS. A 4 mm ventral       pancreatic sphincterotomy was made with a monofilament Hydratome       sphincterotome using ERBE electrocautery. There was no       post-sphincterotomy bleeding. Trying to see if I could get a wire       deeper, I transitioned to the Revolution Jagwire 0.025 angled wire.       However, this short 0.025 inch Antonietta Breach was passed into the biliary tree.       The Jagtome sphincterotome was passed over the guidewire and the bile       duct was then deeply cannulated. Contrast was injected. Opacification of       the entire biliary tree was successful. The maximum diameter of the       ducts was 8 mm. The lower third of the main bile duct contained a single       localized narrowing distally for 10 mm in length. A 6 mm biliary       sphincterotomy was made in effort of trying to allow more adequate       pancreatic orifice visualization with a monofilament Revolution Jagtome       sphincterotome using ERBE electrocautery. There was no       post-sphincterotomy bleeding. To discover objects, the biliary tree was       swept with a retrieval balloon. Sludge was swept from the duct. An       occlusion cholangiogram was performed that showed no further significant       biliary pathology other than the slight narrowing distally. Cells for       cytology were  obtained by brushing in the lower third of the main bile       duct at the narrowing. The ventral pancreatic duct was swept with the       Jagtome sphincterotome after transitioning to the angled Revolution wire       starting at the main pancreatic duct in the head of the pancreas. A few       fragments of pancreatic stones were removed  but I could not get the       sphincterotome or the balloon to go any further into the pancreatic       duct. Thus, based on EUS, I knew many stones remained. One 4 Fr by 5 cm       temporary plastic pancreatic stent with a single external pigtail was       placed into the ventral pancreatic duct. Clear fluid flowed through the       stent. The stent was in good position.      The duodenoscope was withdrawn from the patient. Impression:               - The major papilla appeared congested.                           - A single localized biliary narrowing was found in                            the lower third of the main bile duct. The                            stricture was inflammatory in appearance (normal                            LFTs) but we decided to take brushings to see for                            cytology.                           - A filling defect was seen on the pancreatogram                            within the head of the pancreas.                           - Pancreatic stones were found. But as noted above,                            we could not get the wire to go deep into the                            pancreas duct, thus we could not perform a true                            pancreatic duct stenting/stone removal today.                           -  A pancreatic sphincterotomy was performed.                           - A biliary sphincterotomy was performed.                           - The biliary tree was swept and sludge was found                            but no overt stones. The cystic duct filled the                            gallbladder.                           - The ventral pancreatic duct was swept with some                            small pancreatic stone fragments noted.                           - One temporary plastic pancreatic stent was placed                            into the ventral pancreatic duct to decrease PEP, I                             did not think I could place a standing Pancreatic                            stent. Moderate Sedation:      Not Applicable - Patient had care per Anesthesia. Recommendation:           - The patient will be observed post-procedure,                            until all discharge criteria are met. If she does                            not meet criteria for discharge (pain was 10/10                            prior to beginning procedure) then she will likely                            need admission for pain control post-procedures.                           - Discharge patient to home.                           - Patient has a contact number available for  emergencies. The signs and symptoms of potential                            delayed complications were discussed with the                            patient. Return to normal activities tomorrow.                            Written discharge instructions were provided to the                            patient.                           - Low fat diet.                           - Watch for pancreatitis, bleeding, perforation,                            and cholangitis.                           - Observe patient's clinical course.                           - May restart anticoagualtion in 48 hours (8/23) to                            decrease post-procedure complications.                           - Patient will need a KUB 2-view in 10-14 days to                            ensure pancreatic stent has migrated successfully.                            If still present at that time will need to be                            scheduled for EGD with stent pull.                           - Will discuss with patient/family likely referral                            to quatern[facility] center to see if another attempt at                            endoscopic management of the PD stones may be                             possible vs referral for surgical management  and                            consideration of [place] vs Sharlene Motts procedure could be                            a possibility. Hopefully within the next few days                            we will see if the Celiac block was helpful for her                            pain.                           - The findings and recommendations were discussed                            with the patient.                           - The findings and recommendations were discussed                            with the patient's family. Procedure Code(s):        --- Professional ---                           [phone], Endoscopic retrograde                            cholangiopancreatography (ERCP); with placement of                            endoscopic stent into biliary or pancreatic duct,                            including pre- and post-dilation and guide wire                            passage, when performed, including sphincterotomy,                            when performed, each stent                           [id number], Endoscopic retrograde                            cholangiopancreatography (ERCP); with removal of                            calculi/debris from biliary/pancreatic duct(s) Diagnosis Code(s):        --- Professional ---                           K83.1, Obstruction  of bile duct                           K86.89, Other specified diseases of pancreas                           R10.84, Generalized abdominal pain                           K86.1, Other chronic pancreatitis                           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 All rights reserved. The codes documented in this report are  preliminary and upon coder review may  be revised to meet current compliance requirements. Justice Britain, MD 08/22/2021 10:25:51 AM Number of Addenda: 0

## 2021-08-22 NOTE — H&P (Signed)
GASTROENTEROLOGY PROCEDURE H&P NOTE   Primary Care Physician: Teodora Medici, DO  HPI: Amy Wolfe is a 46 y.o. female who presents for EUS/ERCP.  EUS for attempt at Celiac block for chronic pain.  ERCP for attempt at PD cannulation and stenting/stone removal.  Past Medical History:  Diagnosis Date   Abdominal pain    Anxiety    Coronary artery disease    Diabetes mellitus without complication (HCC)    Dyspnea    GERD (gastroesophageal reflux disease)    H/O blood clots    Hypertension    Liver abscess    Pancreatitis 2019   PONV (postoperative nausea and vomiting) 11/07/2018   Thyroid disease    Uterine fibroid    Past Surgical History:  Procedure Laterality Date   BIOPSY  06/01/2021   Procedure: BIOPSY;  Surgeon: Irving Copas., MD;  Location: [facility];  Service: Gastroenterology;;   CESAREAN SECTION     EMBOLIZATION N/A 06/14/2018   Procedure: Uterine Embolization;  Surgeon: Algernon Huxley, MD;  Location: [facility];  Service: Cardiovascular;  Laterality: N/A;   ESOPHAGOGASTRODUODENOSCOPY (EGD) WITH PROPOFOL N/A 06/01/2021   Procedure: ESOPHAGOGASTRODUODENOSCOPY (EGD) WITH PROPOFOL;  Surgeon: Rush Landmark Telford Nab., MD;  Location: [facility];  Service: Gastroenterology;  Laterality: N/A;   EUS N/A 06/01/2021   Procedure: UPPER ENDOSCOPIC ULTRASOUND (EUS) RADIAL;  Surgeon: Irving Copas., MD;  Location: [facility];  Service: Gastroenterology;  Laterality: N/A;   laceration finger Right    LAPAROSCOPIC VAGINAL HYSTERECTOMY WITH SALPINGECTOMY Bilateral 10/28/2018   Procedure: LAPAROSCOPIC ASSISTED VAGINAL HYSTERECTOMY BILATERAL WITH SALPINGECTOMY;  Surgeon: Rubie Maid, MD;  Location: [facility];  Service: Gynecology;  Laterality: Bilateral;   THYROIDECTOMY, PARTIAL     VAGINAL HYSTERECTOMY Bilateral 10/28/2018   Procedure: HYSTERECTOMY VAGINAL WITH BILATERAL SALPINGECTOMY;  Surgeon: Rubie Maid, MD;  Location: [facility];   Service: Gynecology;  Laterality: Bilateral;   Current Facility-Administered Medications  Medication Dose Route Frequency Provider Last Rate Last Admin   0.9 %  sodium chloride infusion   Intravenous Continuous Mansouraty, Telford Nab., MD       bupivacaine (PF) (MARCAINE) 0.25 % injection 20 mL  20 mL Other STAT Mansouraty, Telford Nab., MD       labetalol (NORMODYNE) injection 10 mg  10 mg Intravenous Q10 min PRN Santa Lighter, MD   10 mg at 08/22/21 0719   lactated ringers infusion   Intravenous Continuous Mansouraty, Telford Nab., MD 10 mL/hr at 08/22/21 0730 Continued from Pre-op at 08/22/21 0730   scopolamine (TRANSDERM-SCOP) 1 MG/3DAYS 1.5 mg  1 patch Transdermal Once Santa Lighter, MD   1.5 mg at 08/22/21 8756    Current Facility-Administered Medications:    0.9 %  sodium chloride infusion, , Intravenous, Continuous, Mansouraty, Telford Nab., MD   bupivacaine (PF) (MARCAINE) 0.25 % injection 20 mL, 20 mL, Other, STAT, Mansouraty, Telford Nab., MD   labetalol (NORMODYNE) injection 10 mg, 10 mg, Intravenous, Q10 min PRN, Santa Lighter, MD, 10 mg at 08/22/21 0719   lactated ringers infusion, , Intravenous, Continuous, Mansouraty, Telford Nab., MD, Last Rate: 10 mL/hr at 08/22/21 0730, Continued from Pre-op at 08/22/21 0730   scopolamine (TRANSDERM-SCOP) 1 MG/3DAYS 1.5 mg, 1 patch, Transdermal, Once, Santa Lighter, MD, 1.5 mg at 08/22/21 4332 No Known Allergies Family History  Problem Relation Age of Onset   Hypertension Mother    Diabetes Mother    Brain cancer Father    Aneurysm Brother    Bone  cancer Maternal Grandmother    Lung cancer Maternal Grandfather    Cancer Paternal Grandmother    Cancer Paternal Grandfather    Colon cancer Neg Hx    Esophageal cancer Neg Hx    Inflammatory bowel disease Neg Hx    Liver disease Neg Hx    Pancreatic cancer Neg Hx    Rectal cancer Neg Hx    Stomach cancer Neg Hx    Social History   Socioeconomic History   Marital status: Single     Spouse name: Not on file   Number of children: 2   Years of education: 12   Highest education level: Associate degree: academic program  Occupational History   Occupation: [employer]  Tobacco Use   Smoking status: Every Day    Packs/day: 0.50    Years: 35.00    Total pack years: 17.50    Types: Cigarettes   Smokeless tobacco: Never  Vaping Use   Vaping Use: Never used  Substance and Sexual Activity   Alcohol use: Not Currently   Drug use: Yes    Types: Marijuana    Comment: daily   Sexual activity: Yes    Birth control/protection: Surgical  Other Topics Concern   Not on file  Social History Narrative   Not on file   Social Determinants of Health   Financial Resource Strain: Low Risk  (10/04/2018)   Overall Financial Resource Strain (CARDIA)    Difficulty of Paying Living Expenses: Not hard at all  Food Insecurity: No Food Insecurity (10/04/2018)   Hunger Vital Sign    Worried About Running Out of Food in the Last Year: Never true    Ran Out of Food in the Last Year: Never true  Transportation Needs: No Transportation Needs (10/04/2018)   PRAPARE - Hydrologist (Medical): No    Lack of Transportation (Non-Medical): No  Physical Activity: Inactive (10/04/2018)   Exercise Vital Sign    Days of Exercise per Week: 0 days    Minutes of Exercise per Session: 0 min  Stress: No Stress Concern Present (10/04/2018)   [place]    Feeling of Stress : Only a little  Social Connections: Unknown (10/04/2018)   Social Connection and Isolation Panel [NHANES]    Frequency of Communication with Friends and Family: More than three times a week    Frequency of Social Gatherings with Friends and Family: More than three times a week    Attends Religious Services: Never    Marine scientist or Organizations: No    Attends Archivist Meetings: Never    Marital Status: Not on file   Intimate Partner Violence: Not At Risk (10/04/2018)   Humiliation, Afraid, Rape, and Kick questionnaire    Fear of Current or Ex-Partner: No    Emotionally Abused: No    Physically Abused: No    Sexually Abused: No    Physical Exam: Today's Vitals   08/22/21 0648 08/22/21 0700 08/22/21 0710 08/22/21 0740  BP: (!) 187/124 (!) 190/116 (!) 183/105 (!) 162/100  Pulse: 60     Resp: 12     Temp: 97.9 F (36.6 C)     TempSrc: Temporal     SpO2: 100%     Weight: 56.2 kg     Height: '5\' 7"'$  (1.702 m)     PainSc: 10-Worst pain ever      Body mass index is 19.42 kg/m. GEN:  NAD EYE: Sclerae anicteric ENT: MMM CV: Non-tachycardic GI: Soft, NT/ND NEURO:  Alert & Oriented x 3  Lab Results: No results for input(s): "WBC", "HGB", "HCT", "PLT" in the last 72 hours. BMET No results for input(s): "NA", "K", "CL", "CO2", "GLUCOSE", "BUN", "CREATININE", "CALCIUM" in the last 72 hours. LFT No results for input(s): "PROT", "ALBUMIN", "AST", "ALT", "ALKPHOS", "BILITOT", "BILIDIR", "IBILI" in the last 72 hours. PT/INR No results for input(s): "LABPROT", "INR" in the last 72 hours.   Impression / Plan: This is a 46 y.o.female who presents for EUS/ERCP.  EUS for attempt at Celiac block for chronic pain.  ERCP for attempt at PD cannulation and stenting/stone removal.  The risks of an EUS including intestinal perforation, bleeding, infection, aspiration, and medication effects were discussed as was the possibility it may not give a definitive diagnosis if a biopsy is performed.  When a biopsy of the pancreas is done as part of the EUS, there is an additional risk of pancreatitis at the rate of about 1-2%.  It was explained that procedure related pancreatitis is typically mild, although it can be severe and even life threatening, which is why we do not perform random pancreatic biopsies and only biopsy a lesion/area we feel is concerning enough to warrant the risk.  The risks of an ERCP were discussed  at length, including but not limited to the risk of perforation, bleeding, abdominal pain, post-ERCP pancreatitis (while usually mild can be severe and even life threatening).   The risks and benefits of endoscopic evaluation/treatment were discussed with the patient and/or family; these include but are not limited to the risk of perforation, infection, bleeding, missed lesions, lack of diagnosis, severe illness requiring hospitalization, as well as anesthesia and sedation related illnesses.  The patient's history has been reviewed, patient examined, no change in status, and deemed stable for procedure.  The patient and/or family is agreeable to proceed.    Justice Britain, MD [facility] Office # [phone]

## 2021-08-22 NOTE — Discharge Instructions (Signed)
YOU HAD AN ENDOSCOPIC PROCEDURE TODAY: Refer to the procedure report and other information in the discharge instructions given to you for any specific questions about what was found during the examination. If this information does not answer your questions, please call [place] office at [phone] to clarify.   YOU SHOULD EXPECT: Some feelings of bloating in the abdomen. Passage of more gas than usual. Walking can help get rid of the air that was put into your GI tract during the procedure and reduce the bloating. If you had a lower endoscopy (such as a colonoscopy or flexible sigmoidoscopy) you may notice spotting of blood in your stool or on the toilet paper. Some abdominal soreness may be present for a day or two, also.  DIET: Your first meal following the procedure should be a light meal and then it is ok to progress to your normal diet. A half-sandwich or bowl of soup is an example of a good first meal. Heavy or fried foods are harder to digest and may make you feel nauseous or bloated. Drink plenty of fluids but you should avoid alcoholic beverages for 24 hours. If you had a esophageal dilation, please see attached instructions for diet.    ACTIVITY: Your care partner should take you home directly after the procedure. You should plan to take it easy, moving slowly for the rest of the day. You can resume normal activity the day after the procedure however YOU SHOULD NOT DRIVE, use power tools, machinery or perform tasks that involve climbing or major physical exertion for 24 hours (because of the sedation medicines used during the test).   SYMPTOMS TO REPORT IMMEDIATELY: A gastroenterologist can be reached at any hour. Please call [phone]  for any of the following symptoms:   Following upper endoscopy (EGD, EUS, ERCP, esophageal dilation) Vomiting of blood or coffee ground material  New, significant abdominal pain  New, significant chest pain or pain under the shoulder blades  Painful or  persistently difficult swallowing  New shortness of breath  Black, tarry-looking or red, bloody stools  FOLLOW UP:  If any biopsies were taken you will be contacted by phone or by letter within the next 1-3 weeks. Call [phone]  if you have not heard about the biopsies in 3 weeks.  Please also call with any specific questions about appointments or follow up tests.  

## 2021-08-22 NOTE — Anesthesia Postprocedure Evaluation (Signed)
Anesthesia Post Note  Patient: Public librarian  Procedure(s) Performed: ESOPHAGEAL ENDOSCOPIC ULTRASOUND (EUS) RADIAL ENDOSCOPIC RETROGRADE CHOLANGIOPANCREATOGRAPHY (ERCP) WITH PROPOFOL ESOPHAGOGASTRODUODENOSCOPY (EGD) WITH PROPOFOL SPHINCTEROTOMY REMOVAL OF STONES BILIARY BRUSHING PANCREATIC STENT PLACEMENT     Patient location during evaluation: Endoscopy Anesthesia Type: General Level of consciousness: awake and alert Pain management: pain level controlled Vital Signs Assessment: post-procedure vital signs reviewed and stable Respiratory status: spontaneous breathing, nonlabored ventilation, respiratory function stable and patient connected to nasal cannula oxygen Cardiovascular status: blood pressure returned to baseline and stable Postop Assessment: no apparent nausea or vomiting Anesthetic complications: no   No notable events documented.  Last Vitals:  Vitals:   08/22/21 1050 08/22/21 1100  BP: (!) 151/83 (!) 171/112  Pulse: 63 (!) 56  Resp: 13 16  Temp:    SpO2: 100% 100%    Last Pain:  Vitals:   08/22/21 1100  TempSrc:   PainSc: 0-No pain                 Santa Lighter

## 2021-08-22 NOTE — Transfer of Care (Signed)
Immediate Anesthesia Transfer of Care Note  Patient: Amy Wolfe  Procedure(s) Performed: ESOPHAGEAL ENDOSCOPIC ULTRASOUND (EUS) RADIAL ENDOSCOPIC RETROGRADE CHOLANGIOPANCREATOGRAPHY (ERCP) WITH PROPOFOL ESOPHAGOGASTRODUODENOSCOPY (EGD) WITH PROPOFOL SPHINCTEROTOMY REMOVAL OF STONES BILIARY BRUSHING PANCREATIC STENT PLACEMENT  Patient Location: PACU  Anesthesia Type:General  Level of Consciousness: drowsy  Airway & Oxygen Therapy: Patient Spontanous Breathing and Patient connected to nasal cannula oxygen  Post-op Assessment: Report given to RN and Post -op Vital signs reviewed and stable  Post vital signs: Reviewed and stable  Last Vitals:  Vitals Value Taken Time  BP 155/99 08/22/21 0953  Temp    Pulse 73 08/22/21 0957  Resp 13 08/22/21 0957  SpO2 100 % 08/22/21 0957  Vitals shown include unvalidated device data.  Last Pain:  Vitals:   08/22/21 0648  TempSrc: Temporal  PainSc: 10-Worst pain ever         Complications: No notable events documented.

## 2021-08-22 NOTE — Anesthesia Procedure Notes (Signed)
Procedure Name: Intubation Date/Time: 08/22/2021 7:51 AM  Performed by: Lieutenant Diego, CRNAPre-anesthesia Checklist: Patient identified, Emergency Drugs available, Suction available and Patient being monitored Patient Re-evaluated:Patient Re-evaluated prior to induction Oxygen Delivery Method: Circle system utilized Preoxygenation: Pre-oxygenation with 100% oxygen Induction Type: IV induction Ventilation: Mask ventilation without difficulty Laryngoscope Size: Miller and 2 Grade View: Grade I Tube type: Oral Tube size: 7.0 mm Number of attempts: 1 Airway Equipment and Method: Stylet Placement Confirmation: ETT inserted through vocal cords under direct vision, positive ETCO2 and breath sounds checked- equal and bilateral Secured at: 22 cm Tube secured with: Tape Dental Injury: Teeth and Oropharynx as per pre-operative assessment

## 2021-08-22 NOTE — Telephone Encounter (Signed)
-----   Message from Irving Copas., MD sent at 08/22/2021  4:31 PM EDT ----- Regarding: Follow-up Amy Wolfe, Please call this patient on Wednesday or Thursday to see how she is done post EUS/ERCP. Needs 2-week KUB with CMP/amylase/lipase to be performed as well. Patient will need a CT abdomen liver protocol with contrast to see if portal venous thrombosis is noted any further.  If it is not, it may be possible to consider stopping her anticoagulation completely.  This can be done after her repeat labs have been completed. Will need a referral to [facility] for repeat attempt at pancreatic duct stone ERCP/pancreatic stenting ERCP. Thanks. GM

## 2021-08-22 NOTE — Op Note (Signed)
[facility] Patient Name: Amy Wolfe Procedure Date: 08/22/2021 MRN: [id number] Attending MD: Justice Britain , MD Date of Birth: 1975-11-29 CSN: [id number] Age: 46 Admit Type: Outpatient Procedure:                Upper EUS Indications:              Chronic pancreatitis, Celiac plexus block for pain                            secondary to chronic pancreatitis, Generalized                            abdominal pain Providers:                Justice Britain, MD, Carlyn Reichert, RN, Luan Moore, Technician Referring MD:             Mauri Pole, MD, Dr. Rosana Berger Medicines:                General Anesthesia Complications:            No immediate complications. Estimated Blood Loss:     Estimated blood loss was minimal. Procedure:                Pre-Anesthesia Assessment:                           - Prior to the procedure, a History and Physical                            was performed, and patient medications and                            allergies were reviewed. The patient's tolerance of                            previous anesthesia was also reviewed. The risks                            and benefits of the procedure and the sedation                            options and risks were discussed with the patient.                            All questions were answered, and informed consent                            was obtained. Prior Anticoagulants: The patient has                            taken Xarelto (rivaroxaban), last dose was 5 days  prior to procedure. ASA Grade Assessment: III - A                            patient with severe systemic disease. After                            reviewing the risks and benefits, the patient was                            deemed in satisfactory condition to undergo the                            procedure.                           After obtaining informed  consent, the endoscope was                            passed under direct vision. Throughout the                            procedure, the patient's blood pressure, pulse, and                            oxygen saturations were monitored continuously. The                            [id number]-[id number] ([id number]) Olympus endoscope was introduced                            through the mouth, and used to inject contrast into                            second part of duodenum. The [id number] ([id number])                            Olympus linear ultrasound scope was introduced                            through the mouth, and used to inject contrast into                            duodenum for ultrasound examination from the                            stomach and duodenum. The upper EUS was                            accomplished without difficulty. The patient                            tolerated the procedure. Scope In: Scope Out: Findings:      ENDOSCOPIC FINDING: :      No gross lesions were noted in the entire  esophagus.      Stripped moderate inflammation characterized by erythema was found in       the gastric antrum. Previously biopsied and negative for HP.      No gross lesions were noted in the entire examined stomach.      Patchy moderate inflammation characterized by erythema, friability and       granularity was found in the duodenal bulb, in the sweep and in the       second portion of the duodenum. Previously biopsied so not reperformed.      ENDOSONOGRAPHIC FINDING: :      An anechoic lesion suggestive of a cyst was identified in the pancreatic       head. It is not in obvious communication with the pancreatic duct. The       lesion measured 17 mm by 16 mm in maximal cross-sectional diameter.       There was a single compartment without septae. The outer wall of the       lesion was thin. There was no associated mass. There was no internal       debris within the fluid-filled cavity.       The pancreatic duct had a beaded endosonographic appearance, had a       dilated endosonographic appearance throughout, had intraductal stones,       had an irregularly contoured endosonographic appearance, had a       prominently branched endosonographic appearance and had a       tortuous/ectatic appearance in the pancreatic head (10.9 mm), genu of       the pancreas (10.8 mm), body of the pancreas (7.8 mm) and tail of the       pancreas (5.8 mm -> 3.7 mm). The largest stone was in the head and neck       (9 mm) and body regions.      Pancreatic parenchymal abnormalities were noted in the entire pancreas.       These consisted of atrophy, hyperechoic foci with shadowing, lobularity       with honeycombing and hyperechoic strands.      There was no sign of significant endosonographic abnormality in the       common bile duct. The maximum diameter of the duct was 5 mm. No biliary       sludge and ducts with regular contour were identified.      A few stones were visualized endosonographically in the gallbladder. The       stones measured up to 10 mm in greatest dimension. The stones were       round. They were hyperechoic and characterized by shadowing.      Endosonographic imaging in the visualized portion of the liver showed no       mass-lesion.      A few enlarged lymph nodes were visualized in the peripancreatic region.       The largest measured 9 mm by 6 mm in maximal cross-sectional diameter.       The nodes were triangular, isoechoic and had well defined margins.      The region of the celiac plexus was visualized. In attempt of helping       her chronic pain syndrome decision had been made previously to attempt a       celiac plexus block was performed. The region of the celiac plexus and       celiac ganglia was identified endosonographically with Color Doppler  imaging, using the take-off of the celiac trunk from the anterior aspect       of the aorta as the main  anatomical landmark. Color Doppler guidance was       also used to confirm a lack of significant vascular structures within       the injection needle path. Using a transgastric approach, a 20 gauge       celiac plexus needle was advanced to injection site on each side of the       celiac artery take-off. Needle aspiration was performed prior to       injection to exclude entry into a blood vessel. A total of 20 mL of       0.25% bupivacaine and 2 mg of triamcinolone (40 mg/mL) were injected for       the celiac plexus block. The needle was then withdrawn. Impression:               EGD Impression:                           - No gross lesions in esophagus.                           - Antral gastritis. No other gross lesions in the                            stomach.                           - Duodenitis.                           EUS Impression:                           - A cystic lesion was seen in the pancreatic head.                            Tissue has not been obtained. However, the                            endosonographic appearance is suggestive of a                            pancreatic pseudocyst (similar size as prior                            procedure).                           - The pancreatic duct had a beaded endosonographic                            appearance, had a dilated endosonographic                            appearance, had intraductal stones, had an  irregularly contoured endosonographic appearance,                            had a prominently branched endosonographic                            appearance and had a tortuous/ectatic appearance in                            the pancreatic head, genu of the pancreas, body of                            the pancreas and tail of the pancreas.                           - Pancreatic parenchymal abnormalities consisting                            of atrophy, hyperechoic foci, lobularity  with                            honeycombing and hyperechoic strands were noted in                            the entire pancreas. This is consistent with known                            chronic pancreatitis.                           - There was no sign of significant pathology in the                            common bile duct.                           - A few stones were visualized endosonographically                            in the gallbladder.                           - A few enlarged lymph nodes were visualized in the                            peripancreatic region. Tissue has not been                            obtained. However, the endosonographic appearance                            is consistent with benign inflammatory changes.                           - Celiac plexus block performed. Moderate Sedation:  Not Applicable - Patient had care per Anesthesia. Recommendation:           - Proceed to scheduled ERCP.                           - Monitor for signs/symptoms of bleeding,                            perforation, and infection. If issues please call                            our number to get further assistance as needed.                           - Observe patient's clinical course.                           - The findings and recommendations were discussed                            with the patient.                           - The findings and recommendations were discussed                            with the patient's family. Procedure Code(s):        --- Professional ---                           [phone], Esophagogastroduodenoscopy, flexible,                            transoral; with transendoscopic ultrasound-guided                            transmural injection of diagnostic or therapeutic                            substance(s) (eg, anesthetic, neurolytic agent) or                            fiducial marker(s) (includes endoscopic ultrasound                             examination of the esophagus, stomach, and either                            the duodenum or a surgically altered stomach where                            the jejunum is examined distal to the anastomosis) Diagnosis Code(s):        --- Professional ---                           K29.70, Gastritis, unspecified, without  bleeding                           K29.80, Duodenitis without bleeding                           K86.2, Cyst of pancreas                           K86.89, Other specified diseases of pancreas                           R93.3, Abnormal findings on diagnostic imaging of                            other parts of digestive tract                           K86.9, Disease of pancreas, unspecified                           K80.20, Calculus of gallbladder without                            cholecystitis without obstruction                           R59.0, Localized enlarged lymph nodes                           K86.1, Other chronic pancreatitis                           R10.84, Generalized abdominal pain CPT copyright 2019 [employer]. All rights reserved. The codes documented in this report are preliminary and upon coder review may  be revised to meet current compliance requirements. Justice Britain, MD 08/22/2021 10:08:43 AM Number of Addenda: 0

## 2021-08-22 NOTE — Anesthesia Preprocedure Evaluation (Addendum)
Anesthesia Evaluation  Patient identified by MRN, date of birth, ID band Patient awake    Reviewed: Allergy & Precautions, NPO status , Patient's Chart, lab work & pertinent test results  History of Anesthesia Complications (+) PONV and history of anesthetic complications  Airway Mallampati: II  TM Distance: >3 FB Neck ROM: Full    Dental  (+) Teeth Intact, Dental Advisory Given   Pulmonary Current Smoker,    Pulmonary exam normal breath sounds clear to auscultation       Cardiovascular hypertension, + CAD  Normal cardiovascular exam Rhythm:Regular Rate:Normal     Neuro/Psych PSYCHIATRIC DISORDERS Anxiety negative neurological ROS     GI/Hepatic GERD  Medicated,(+)     substance abuse  marijuana use, Pancreatic cyst & dialated pancreatic duct   Endo/Other  diabetes, Insulin Dependent  Renal/GU negative Renal ROS     Musculoskeletal negative musculoskeletal ROS (+)   Abdominal   Peds  Hematology  (+) Blood dyscrasia (Xarelto), ,   Anesthesia Other Findings Day of surgery medications reviewed with the patient.  Reproductive/Obstetrics                             Anesthesia Physical Anesthesia Plan  ASA: 3  Anesthesia Plan: General   Post-op Pain Management: Minimal or no pain anticipated   Induction: Intravenous  PONV Risk Score and Plan: 3 and Midazolam, Dexamethasone, Ondansetron, TIVA, Scopolamine patch - Pre-op and Diphenhydramine  Airway Management Planned: Oral ETT  Additional Equipment:   Intra-op Plan:   Post-operative Plan: Extubation in OR  Informed Consent: I have reviewed the patients History and Physical, chart, labs and discussed the procedure including the risks, benefits and alternatives for the proposed anesthesia with the patient or authorized representative who has indicated his/her understanding and acceptance.     Dental advisory given  Plan  Discussed with: CRNA  Anesthesia Plan Comments:        Anesthesia Quick Evaluation

## 2021-08-24 ENCOUNTER — Encounter (HOSPITAL_COMMUNITY): Payer: Self-pay | Admitting: Gastroenterology

## 2021-08-24 LAB — CYTOLOGY - NON PAP

## 2021-08-24 NOTE — Telephone Encounter (Signed)
Left message on machine to call back  

## 2021-08-24 NOTE — Telephone Encounter (Signed)
I spoke with the pt and she is describing an 8/10 upper abd stabbing pain.  She did take percocet that was prescribed with no relief at this time.  The pain began last night.  I have entered the orders for KUB, CT and labs.  I have also made the referral to [facility].    Dr Rush Landmark please advise if pt needs ED?

## 2021-08-24 NOTE — Telephone Encounter (Signed)
Amy Wolfe, 8 out of 10 pain is better than what she was experiencing prior to [place] performing her EUS celiac block and pancreatic duct stenting attempt via ERCP.  With that being said, if her pain is so severe and she cannot tolerate eating or drinking then she will need to come into the hospital for further evaluation.  If she is able to stay hydrated and is eating/having nutrition, then she can come in for a stat KUB and laboratories (CBC/CMP/amylase/lipase).  Let me know what she decides. The 2-week KUB order should stay in place. The CT abdomen to evaluate for portal venous thrombosis persistence order should stay in place. Thank you for placing the referral. Thanks. GM

## 2021-08-25 NOTE — Telephone Encounter (Signed)
Left message on machine to call back  

## 2021-08-26 ENCOUNTER — Encounter: Payer: Self-pay | Admitting: Gastroenterology

## 2021-08-26 NOTE — Telephone Encounter (Signed)
I have spoken with the pt and she states that she is feeling better with minimal pain. She states that what she is experiencing at this time is the same pain she has had in the past.  She will have CT, X ray and labs as previously ordered.  She will let us know if she has not heard from [place] in a week.

## 2021-09-02 ENCOUNTER — Encounter: Payer: Self-pay | Admitting: Internal Medicine

## 2021-09-02 ENCOUNTER — Ambulatory Visit (INDEPENDENT_AMBULATORY_CARE_PROVIDER_SITE_OTHER): Payer: Medicaid Other | Admitting: Internal Medicine

## 2021-09-02 VITALS — BP 138/92 | HR 73 | Temp 98.0°F | Resp 16 | Ht 67.0 in | Wt 158.0 lb

## 2021-09-02 DIAGNOSIS — E0865 Diabetes mellitus due to underlying condition with hyperglycemia: Secondary | ICD-10-CM | POA: Diagnosis not present

## 2021-09-02 DIAGNOSIS — E46 Unspecified protein-calorie malnutrition: Secondary | ICD-10-CM

## 2021-09-02 DIAGNOSIS — Z794 Long term (current) use of insulin: Secondary | ICD-10-CM | POA: Diagnosis not present

## 2021-09-02 DIAGNOSIS — I81 Portal vein thrombosis: Secondary | ICD-10-CM

## 2021-09-02 LAB — POCT GLYCOSYLATED HEMOGLOBIN (HGB A1C): Hemoglobin A1C: 6.7 % — AB (ref 4.0–5.6)

## 2021-09-02 MED ORDER — RIVAROXABAN 20 MG PO TABS
20.0000 mg | ORAL_TABLET | Freq: Every day | ORAL | 1 refills | Status: DC
  Filled 2021-09-15 – 2021-09-23 (×2): qty 90, 90d supply, fill #0

## 2021-09-02 MED ORDER — LEVEMIR FLEXPEN 100 UNIT/ML ~~LOC~~ SOPN
6.0000 [IU] | PEN_INJECTOR | Freq: Every day | SUBCUTANEOUS | 0 refills | Status: DC
  Filled 2021-09-15 – 2021-09-23 (×2): qty 3, 28d supply, fill #0

## 2021-09-02 MED ORDER — PEN NEEDLES 30G X 8 MM MISC
1.0000 | Freq: Every day | 3 refills | Status: DC
Start: 2021-09-02 — End: 2022-01-31

## 2021-09-02 NOTE — Patient Instructions (Addendum)
It was great seeing you today!  Plan discussed at today's visit: -Blood work ordered today, results will be uploaded to [facility].  -A1c excellent today at 6.7%, continue Levemir 6 units daily -Medications refilled  Follow up in: 3 months   Take care and let us know if you have any questions or concerns prior to your next visit.  Dr. Rosana Berger

## 2021-09-02 NOTE — Progress Notes (Signed)
Established Patient Office Visit  Subjective:  Patient ID: Amy Wolfe, female    DOB: 03/09/1975  Age: 46 y.o. MRN: [id number]  CC:  Chief Complaint  Patient presents with   Follow-up    HPI Amy Wolfe presents for follow up. Friend accompanying her to visit today.   Alcohol Induced Pancreatitis: -Stopped drinking about 1 year ago -Has flares of severe cramping/sharp epigastric pains that radiate to the back on a monthly basis -Pain has been worse with eating any type of food but now pain is much better, has been eating again -Following with GI at [facility]- plan discussed: stop marijuana, tobacco use, PPI increase to BID, autoimmune work up. -Recently underwent EUS/ERCP 08/22/21 for PD cannulation and stenting/stone removal  -Currently on Creon  Diabetes, new onset: -Last A1c 8.8 2/23, 6.7% today -Medications: Levemir 6 units daily  -Failed Meds: Tried Metformin 500 mg but could not tolerate -Checking BG at home: <230 but out of insulin -Diet: improved now that pain is better controlled  -Exercise: None -Eye exam: Due -Foot exam: Due -Microalbumin: Due -Statin: No -PNA vaccine: Due -Denies symptoms of hypoglycemia, polyuria, polydipsia, numbness extremities, foot ulcers/trauma.   Chronic Portal Vein Thrombosis: -Had been on Xarelto 20 mg daily but has been out recently, needs refills   Past Medical History:  Diagnosis Date   Abdominal pain    Anxiety    Coronary artery disease    Diabetes mellitus without complication (HCC)    Dyspnea    GERD (gastroesophageal reflux disease)    H/O blood clots    Hypertension    Liver abscess    Pancreatitis 2019   PONV (postoperative nausea and vomiting) 11/07/2018   Thyroid disease    Uterine fibroid     Past Surgical History:  Procedure Laterality Date   BILIARY BRUSHING  08/22/2021   Procedure: BILIARY BRUSHING;  Surgeon: Irving Copas., MD;  Location: [facility];  Service:  Gastroenterology;;   BIOPSY  06/01/2021   Procedure: BIOPSY;  Surgeon: Irving Copas., MD;  Location: [facility];  Service: Gastroenterology;;   CESAREAN SECTION     EMBOLIZATION N/A 06/14/2018   Procedure: Uterine Embolization;  Surgeon: Algernon Huxley, MD;  Location: [facility];  Service: Cardiovascular;  Laterality: N/A;   ENDOSCOPIC RETROGRADE CHOLANGIOPANCREATOGRAPHY (ERCP) WITH PROPOFOL N/A 08/22/2021   Procedure: ENDOSCOPIC RETROGRADE CHOLANGIOPANCREATOGRAPHY (ERCP) WITH PROPOFOL;  Surgeon: Rush Landmark Telford Nab., MD;  Location: [facility];  Service: Gastroenterology;  Laterality: N/A;   ESOPHAGOGASTRODUODENOSCOPY (EGD) WITH PROPOFOL N/A 06/01/2021   Procedure: ESOPHAGOGASTRODUODENOSCOPY (EGD) WITH PROPOFOL;  Surgeon: Rush Landmark Telford Nab., MD;  Location: [facility];  Service: Gastroenterology;  Laterality: N/A;   ESOPHAGOGASTRODUODENOSCOPY (EGD) WITH PROPOFOL N/A 08/22/2021   Procedure: ESOPHAGOGASTRODUODENOSCOPY (EGD) WITH PROPOFOL;  Surgeon: Rush Landmark Telford Nab., MD;  Location: [facility];  Service: Gastroenterology;  Laterality: N/A;   EUS N/A 06/01/2021   Procedure: UPPER ENDOSCOPIC ULTRASOUND (EUS) RADIAL;  Surgeon: Irving Copas., MD;  Location: [facility];  Service: Gastroenterology;  Laterality: N/A;   EUS N/A 08/22/2021   Procedure: ESOPHAGEAL ENDOSCOPIC ULTRASOUND (EUS) RADIAL;  Surgeon: Rush Landmark Telford Nab., MD;  Location: [facility];  Service: Gastroenterology;  Laterality: N/A;   laceration finger Right    LAPAROSCOPIC VAGINAL HYSTERECTOMY WITH SALPINGECTOMY Bilateral 10/28/2018   Procedure: LAPAROSCOPIC ASSISTED VAGINAL HYSTERECTOMY BILATERAL WITH SALPINGECTOMY;  Surgeon: Rubie Maid, MD;  Location: [facility];  Service: Gynecology;  Laterality: Bilateral;   NEUROLYTIC CELIAC PLEXUS  08/22/2021   Procedure: NEUROLYTIC CELIAC PLEXUS;  Surgeon: Rush Landmark Telford Nab., MD;  Location: [facility];  Service: Gastroenterology;;    PANCREATIC STENT PLACEMENT  08/22/2021   Procedure: PANCREATIC STENT PLACEMENT;  Surgeon: Irving Copas., MD;  Location: [facility];  Service: Gastroenterology;;   REMOVAL OF STONES  08/22/2021   Procedure: REMOVAL OF STONES;  Surgeon: Irving Copas., MD;  Location: [facility];  Service: Gastroenterology;;   [name]  08/22/2021   Procedure: [name];  Surgeon: Irving Copas., MD;  Location: [facility];  Service: Gastroenterology;;   THYROIDECTOMY, PARTIAL     VAGINAL HYSTERECTOMY Bilateral 10/28/2018   Procedure: HYSTERECTOMY VAGINAL WITH BILATERAL SALPINGECTOMY;  Surgeon: Rubie Maid, MD;  Location: [facility];  Service: Gynecology;  Laterality: Bilateral;    Family History  Problem Relation Age of Onset   Hypertension Mother    Diabetes Mother    Brain cancer Father    Aneurysm Brother    Bone cancer Maternal Grandmother    Lung cancer Maternal Grandfather    Cancer Paternal Grandmother    Cancer Paternal Grandfather    Colon cancer Neg Hx    Esophageal cancer Neg Hx    Inflammatory bowel disease Neg Hx    Liver disease Neg Hx    Pancreatic cancer Neg Hx    Rectal cancer Neg Hx    Stomach cancer Neg Hx     Social History   Socioeconomic History   Marital status: Single    Spouse name: Not on file   Number of children: 2   Years of education: 12   Highest education level: Associate degree: academic program  Occupational History   Occupation: [employer]  Tobacco Use   Smoking status: Every Day    Packs/day: 0.50    Years: 35.00    Total pack years: 17.50    Types: Cigarettes   Smokeless tobacco: Never  Vaping Use   Vaping Use: Never used  Substance and Sexual Activity   Alcohol use: Not Currently   Drug use: Yes    Types: Marijuana    Comment: daily   Sexual activity: Yes    Birth control/protection: Surgical  Other Topics Concern   Not on file  Social History Narrative   Not on file   Social Determinants of Health    Financial Resource Strain: Low Risk  (10/04/2018)   Overall Financial Resource Strain (CARDIA)    Difficulty of Paying Living Expenses: Not hard at all  Food Insecurity: No Food Insecurity (10/04/2018)   Hunger Vital Sign    Worried About Running Out of Food in the Last Year: Never true    Ran Out of Food in the Last Year: Never true  Transportation Needs: No Transportation Needs (10/04/2018)   PRAPARE - Hydrologist (Medical): No    Lack of Transportation (Non-Medical): No  Physical Activity: Inactive (10/04/2018)   Exercise Vital Sign    Days of Exercise per Week: 0 days    Minutes of Exercise per Session: 0 min  Stress: No Stress Concern Present (10/04/2018)   [place]    Feeling of Stress : Only a little  Social Connections: Unknown (10/04/2018)   Social Connection and Isolation Panel [NHANES]    Frequency of Communication with Friends and Family: More than three times a week    Frequency of Social Gatherings with Friends and Family: More than three times a week    Attends Religious Services: Never    Active Member of  Clubs or Organizations: No    Attends Archivist Meetings: Never    Marital Status: Not on file  Intimate Partner Violence: Not At Risk (10/04/2018)   Humiliation, Afraid, Rape, and Kick questionnaire    Fear of Current or Ex-Partner: No    Emotionally Abused: No    Physically Abused: No    Sexually Abused: No    Outpatient Medications Prior to Visit  Medication Sig Dispense Refill   ACCU-CHEK GUIDE test strip USE TO CHECK BLOOD SUGAR UP TO 4 TIMES DAILY AS DIRECTED 100 each 2   Accu-Chek Softclix Lancets lancets USE TO CHECK BLOOD SUGAR UP TO 4 TIMES DAILY AS DIRECTED 100 each 2   acetaminophen (TYLENOL) 500 MG tablet Take 2 tablets (1,000 mg total) by mouth every 6 (six) hours as needed for mild pain, fever or headache (or Fever >/= 101). DO Not Take if  taking the Percocet's as well 30 tablet 0   blood glucose meter kit and supplies KIT Dispense based on patient and insurance preference. Use up to four times daily as directed. 1 each 0   blood glucose meter kit and supplies Dispense based on patient and insurance preference. Use up to four times daily as directed. (FOR ICD-10 E10.9, E11.9). 1 each 0   insulin detemir (LEVEMIR FLEXPEN) 100 UNIT/ML FlexPen Inject 5 Units into the skin daily. (Patient taking differently: Inject 6 Units into the skin daily.) 15 mL 1   Insulin Pen Needle (PEN NEEDLES) 30G X 8 MM MISC 1 each by Does not apply route daily. 90 each 3   lipase/protease/amylase (CREON) 36000 UNITS CPEP capsule Take 1-2 capsules (36,000-72,000 Units total) by mouth See admin instructions. Take 2 capsules (72000u) by mouth three times daily with meals and take 1 capsule (36000u) by mouth twice daily with snacks 240 capsule 6   Multiple Vitamins-Minerals (MULTIVITAMIN ADULT) CHEW Chew 1 each by mouth daily.     nicotine (NICODERM CQ - DOSED IN MG/24 HOURS) 21 mg/24hr patch Place 1 patch (21 mg total) onto the skin daily. 28 patch 0   Nutritional Supplements (,FEEDING SUPPLEMENT, PROSOURCE PLUS) liquid Take 30 mLs by mouth 3 (three) times daily between meals. 3000 mL 0   ondansetron (ZOFRAN) 4 MG tablet Take 1 tablet (4 mg total) by mouth every 6 (six) hours as needed for nausea. 20 tablet 0   oxyCODONE-acetaminophen (PERCOCET/ROXICET) 5-325 MG tablet Take 1 tablet by mouth every 4 (four) hours as needed for severe pain. 30 tablet 0   pantoprazole (PROTONIX) 40 MG tablet Take 1 tablet (40 mg total) by mouth 2 (two) times daily before a meal. 60 tablet 6   polyethylene glycol (MIRALAX / GLYCOLAX) 17 g packet Take 17 g by mouth daily. 14 each 0   rivaroxaban (XARELTO) 20 MG TABS tablet Take 1 tablet (20 mg total) by mouth daily with supper. 30 tablet 1   No facility-administered medications prior to visit.    No Known Allergies  ROS Review of  Systems  Constitutional:  Negative for appetite change, chills and fever.  Respiratory:  Negative for shortness of breath.   Cardiovascular:  Negative for chest pain.  Gastrointestinal:  Negative for abdominal pain, constipation, diarrhea, nausea and vomiting.      Objective:    Physical Exam Constitutional:      Appearance: Normal appearance.  HENT:     Head: Normocephalic and atraumatic.  Eyes:     Conjunctiva/sclera: Conjunctivae normal.  Cardiovascular:     Rate  and Rhythm: Normal rate and regular rhythm.     Pulses:          Dorsalis pedis pulses are 2+ on the right side and 2+ on the left side.  Pulmonary:     Effort: Pulmonary effort is normal.     Breath sounds: Normal breath sounds.  Musculoskeletal:     Right lower leg: No edema.     Left lower leg: No edema.     Right foot: Normal range of motion. No deformity, bunion, Charcot foot, foot drop or prominent metatarsal heads.     Left foot: Normal range of motion. No deformity, bunion, Charcot foot, foot drop or prominent metatarsal heads.  Feet:     Right foot:     Protective Sensation: 6 sites tested.  6 sites sensed.     Skin integrity: Skin integrity normal.     Toenail Condition: Right toenails are normal.     Left foot:     Protective Sensation: 6 sites tested.  6 sites sensed.     Skin integrity: Skin integrity normal.     Toenail Condition: Left toenails are normal.  Skin:    General: Skin is warm and dry.  Neurological:     General: No focal deficit present.     Mental Status: She is alert. Mental status is at baseline.  Psychiatric:        Mood and Affect: Mood normal.        Behavior: Behavior normal.     BP (!) 138/92   Pulse 73   Temp 98 F (36.7 C)   Resp 16   Ht '5\' 7"'  (1.702 m)   Wt 158 lb (71.7 kg)   LMP 09/30/2018   SpO2 96%   BMI 24.75 kg/m  Wt Readings from Last 3 Encounters:  08/22/21 124 lb (56.2 kg)  07/26/21 128 lb (58.1 kg)  07/07/21 128 lb (58.1 kg)     Health  Maintenance Due  Topic Date Due   COVID-19 Vaccine (1) Never done   FOOT EXAM  Never done   OPHTHALMOLOGY EXAM  Never done   URINE MICROALBUMIN  Never done   PAP SMEAR-Modifier  Never done   INFLUENZA VACCINE  Never done   HEMOGLOBIN A1C  08/24/2021    There are no preventive care reminders to display for this patient.  Lab Results  Component Value Date   TSH 1.76 03/08/2021   Lab Results  Component Value Date   WBC 4.6 07/30/2021   HGB 12.7 07/30/2021   HCT 39.7 07/30/2021   MCV 85.7 07/30/2021   PLT 215 07/30/2021   Lab Results  Component Value Date   NA 138 07/30/2021   K 4.0 07/30/2021   CO2 30 07/30/2021   GLUCOSE 155 (H) 07/30/2021   BUN 11 07/30/2021   CREATININE 0.56 07/30/2021   BILITOT 0.2 (L) 07/30/2021   ALKPHOS 46 07/30/2021   AST 19 07/30/2021   ALT 14 07/30/2021   PROT 5.1 (L) 07/30/2021   ALBUMIN 2.7 (L) 07/30/2021   CALCIUM 7.6 (L) 07/30/2021   ANIONGAP 5 07/30/2021   GFR 97.61 03/08/2021   No results found for: "CHOL" No results found for: "HDL" No results found for: "LDLCALC" Lab Results  Component Value Date   TRIG 127.0 10/05/2020   No results found for: "CHOLHDL" Lab Results  Component Value Date   HGBA1C 8.8 (H) 02/24/2021      Assessment & Plan:   1. Diabetes mellitus due to underlying  condition with hyperglycemia, with long-term current use of insulin ([place]): POC A1c 6.7% today. Continue Levemir 6 units, refilled along with needles. Patient saying she is having an issue affording $10 insulin needles, referral to pharmacy placed to see if she qualifies for any assistance programs. Discussed how she needs the insulin and this needs to be prioritized. Foot exam and urine microalbumin today. Follow up in 3 months.   - POCT HgB A1C - Insulin Pen Needle (PEN NEEDLES) 30G X 8 MM MISC; 1 each by Does not apply route daily.  Dispense: 90 each; Refill: 3 - insulin detemir (LEVEMIR FLEXPEN) 100 UNIT/ML FlexPen; Inject 6 Units into the skin  daily.  Dispense: 15 mL; Refill: 0 - AMB Referral to [facility] - Urine Microalbumin w/creat. ratio  2. Portal vein thrombosis: Has an appointment with vascular in January. I will refill her Xarelto 20 mg until she can get into vascular.   - rivaroxaban (XARELTO) 20 MG TABS tablet; Take 1 tablet (20 mg total) by mouth daily with supper.  Dispense: 90 tablet; Refill: 1  3. Protein-calorie malnutrition, unspecified severity ([place]): Nutrition status improved, gained weight. Recheck CMP today.  - COMPLETE METABOLIC PANEL WITH GFR  Follow-up: Return in about 3 months (around 12/02/2021).   Teodora Medici, DO

## 2021-09-03 LAB — COMPLETE METABOLIC PANEL WITH GFR
AG Ratio: 1.6 (calc) (ref 1.0–2.5)
ALT: 20 U/L (ref 6–29)
AST: 19 U/L (ref 10–30)
Albumin: 3.9 g/dL (ref 3.6–5.1)
Alkaline phosphatase (APISO): 138 U/L — ABNORMAL HIGH (ref 31–125)
BUN: 8 mg/dL (ref 7–25)
CO2: 29 mmol/L (ref 20–32)
Calcium: 9 mg/dL (ref 8.6–10.2)
Chloride: 103 mmol/L (ref 98–110)
Creat: 0.7 mg/dL (ref 0.50–0.99)
Globulin: 2.5 g/dL (calc) (ref 1.9–3.7)
Glucose, Bld: 144 mg/dL — ABNORMAL HIGH (ref 65–99)
Potassium: 3.7 mmol/L (ref 3.5–5.3)
Sodium: 141 mmol/L (ref 135–146)
Total Bilirubin: 0.4 mg/dL (ref 0.2–1.2)
Total Protein: 6.4 g/dL (ref 6.1–8.1)
eGFR: 110 mL/min/{1.73_m2} (ref >=60)

## 2021-09-03 LAB — MICROALBUMIN / CREATININE URINE RATIO
Creatinine, Urine: 79 mg/dL (ref 20–275)
Microalb Creat Ratio: 10 mcg/mg creat (ref <30)
Microalb, Ur: 0.8 mg/dL

## 2021-09-07 ENCOUNTER — Telehealth: Payer: Self-pay | Admitting: Pharmacist

## 2021-09-07 NOTE — Progress Notes (Signed)
Contacted patient regarding referral for medication access from Teodora Medici, DO .   Left patient a voicemail to return my call at their [place], PharmD, [facility] [phone]

## 2021-09-09 NOTE — Progress Notes (Signed)
Contacted patient regarding referral for diabetes from Teodora Medici, DO .   Appointment scheduled   Catie Hedwig Morton, PharmD, [facility] [phone]

## 2021-09-11 DIAGNOSIS — Z79899 Other long term (current) drug therapy: Secondary | ICD-10-CM | POA: Insufficient documentation

## 2021-09-11 DIAGNOSIS — M899 Disorder of bone, unspecified: Secondary | ICD-10-CM | POA: Insufficient documentation

## 2021-09-11 DIAGNOSIS — Z789 Other specified health status: Secondary | ICD-10-CM | POA: Insufficient documentation

## 2021-09-11 NOTE — Progress Notes (Unsigned)
Patient: Amy Wolfe  Service Category: Precharting review  Provider:  A , MD  DOB: 09/14/1975  DOS: 09/12/2021  Referring Provider: Mansouraty, Gabriel Jr.*  MRN: [id number]  Setting: Ambulatory outpatient  PCP: Andrews, Elisabeth, DO  Type: New Patient  Specialty: Interventional Pain Management           NO-SHOW to initial pain management evaluation at [facility] (09/12/2021).  Historic Controlled Substance Pharmacotherapy Review  PMP and historical list of controlled substances: Oxycodone/APAP 5/325, 1 tab p.o. every 4 hours (# 30) (last filled on 08/22/2021); oxycodone/APAP 10/325 (# 8) (on 02/22/2021); oxycodone IR 5 mg (# 12) (last filled 09/05/2020); tramadol 50 mg tablet (# 20) (last filled 01/23/2020). Current opioid analgesics:   Oxycodone/APAP 5/325, 1 tab p.o. every 4 hours (# 30) (last filled on 08/22/2021) MME/day: 45 mg/day  Historical Monitoring: The patient  reports current drug use. Drug: Marijuana. List of prior UDS Testing: Lab Results  Component Value Date   MDMA NONE DETECTED 09/04/2020   MDMA NONE DETECTED 09/01/2020   MDMA NONE DETECTED 02/05/2020   MDMA NONE DETECTED 01/09/2020   MDMA NONE DETECTED 03/31/2019   MDMA NONE DETECTED 10/28/2018   COCAINSCRNUR NONE DETECTED 09/04/2020   COCAINSCRNUR NONE DETECTED 09/01/2020   COCAINSCRNUR NONE DETECTED 02/05/2020   COCAINSCRNUR NONE DETECTED 01/09/2020   COCAINSCRNUR NONE DETECTED 03/31/2019   COCAINSCRNUR NONE DETECTED 10/28/2018   PCPSCRNUR NONE DETECTED 09/04/2020   PCPSCRNUR NONE DETECTED 09/01/2020   PCPSCRNUR NONE DETECTED 02/05/2020   PCPSCRNUR NONE DETECTED 01/09/2020   PCPSCRNUR NONE DETECTED 03/31/2019   PCPSCRNUR NONE DETECTED 10/28/2018   THCU POSITIVE (A) 09/04/2020   THCU POSITIVE (A) 09/01/2020   THCU POSITIVE (A) 02/05/2020   THCU POSITIVE (A) 01/09/2020   THCU POSITIVE (A) 03/31/2019   THCU POSITIVE (A) 10/28/2018   ETH <10 07/26/2021   ETH <10 02/05/2020   ETH <10 01/14/2020    ETH 199 (H) 01/09/2020   ETH <10 03/30/2019   Historical Background Evaluation: [place] PMP: PDMP reviewed during this encounter. Review of the past 12-months conducted.             PMP NARX Score Report:  Narcotic: 290 Sedative: 140 Stimulant: 000 [place] Department of public safety, offender search: (Public Information) Non-contributory Risk Assessment Profile: PMP NARX Overdose Risk Score: 330  Meds   Current Outpatient Medications:    ACCU-CHEK GUIDE test strip, USE TO CHECK BLOOD SUGAR UP TO 4 TIMES DAILY AS DIRECTED, Disp: 100 each, Rfl: 2   Accu-Chek Softclix Lancets lancets, USE TO CHECK BLOOD SUGAR UP TO 4 TIMES DAILY AS DIRECTED, Disp: 100 each, Rfl: 2   acetaminophen (TYLENOL) 500 MG tablet, Take 2 tablets (1,000 mg total) by mouth every 6 (six) hours as needed for mild pain, fever or headache (or Fever >/= 101). DO Not Take if taking the Percocet's as well, Disp: 30 tablet, Rfl: 0   blood glucose meter kit and supplies KIT, Dispense based on patient and insurance preference. Use up to four times daily as directed., Disp: 1 each, Rfl: 0   blood glucose meter kit and supplies, Dispense based on patient and insurance preference. Use up to four times daily as directed. (FOR ICD-10 E10.9, E11.9)., Disp: 1 each, Rfl: 0   insulin detemir (LEVEMIR FLEXPEN) 100 UNIT/ML FlexPen, Inject 6 Units into the skin daily., Disp: 15 mL, Rfl: 0   Insulin Pen Needle (PEN NEEDLES) 30G X 8 MM MISC, 1 each by Does not apply route daily., Disp: 90 each, Rfl:   3   lipase/protease/amylase (CREON) 36000 UNITS CPEP capsule, Take 1-2 capsules (36,000-72,000 Units total) by mouth See admin instructions. Take 2 capsules (72000u) by mouth three times daily with meals and take 1 capsule (36000u) by mouth twice daily with snacks, Disp: 240 capsule, Rfl: 6   Multiple Vitamins-Minerals (MULTIVITAMIN ADULT) CHEW, Chew 1 each by mouth daily., Disp: , Rfl:    nicotine (NICODERM CQ - DOSED IN MG/24 HOURS) 21 mg/24hr patch,  Place 1 patch (21 mg total) onto the skin daily. (Patient not taking: Reported on 09/02/2021), Disp: 28 patch, Rfl: 0   Nutritional Supplements (,FEEDING SUPPLEMENT, PROSOURCE PLUS) liquid, Take 30 mLs by mouth 3 (three) times daily between meals., Disp: 3000 mL, Rfl: 0   ondansetron (ZOFRAN) 4 MG tablet, Take 1 tablet (4 mg total) by mouth every 6 (six) hours as needed for nausea., Disp: 20 tablet, Rfl: 0   oxyCODONE-acetaminophen (PERCOCET/ROXICET) 5-325 MG tablet, Take 1 tablet by mouth every 4 (four) hours as needed for severe pain., Disp: 30 tablet, Rfl: 0   pantoprazole (PROTONIX) 40 MG tablet, Take 1 tablet (40 mg total) by mouth 2 (two) times daily before a meal., Disp: 60 tablet, Rfl: 6   polyethylene glycol (MIRALAX / GLYCOLAX) 17 g packet, Take 17 g by mouth daily., Disp: 14 each, Rfl: 0   rivaroxaban (XARELTO) 20 MG TABS tablet, Take 1 tablet (20 mg total) by mouth daily with supper., Disp: 90 tablet, Rfl: 1  Imaging Review  Hip Imaging: Hip-L MR wo contrast: Results for orders placed during the hospital encounter of 09/30/18 MR HIP LEFT WO CONTRAST  Narrative CLINICAL DATA:  Left hip pain after intercourse. Patient unable to fully extend the hips. No previous relevant surgery.  EXAM: MR OF THE LEFT HIP WITHOUT CONTRAST  TECHNIQUE: Multiplanar, multisequence MR imaging was performed. No intravenous contrast was administered.  COMPARISON:  Radiographs 09/30/2018.  Abdominopelvic CT 05/15/2018.  FINDINGS: Bones: There is no evidence of acute fracture, dislocation or avascular necrosis. There is mild reactive edema within the left ischial tuberosity at the origin of the left common hamstring tendon. The visualized bony pelvis otherwise appears normal. The visualized sacroiliac joints and symphysis pubis appear normal.  Articular cartilage and labrum  Articular cartilage: Mild degenerative changes are present at both hips, slightly worse on the right. There are small  acetabular subchondral cysts and osteophytes.  Labrum: There is no gross labral tear or paralabral abnormality. Labral evaluation is mildly limited by the motion.  Joint or bursal effusion  Joint effusion: There is a moderate size left hip joint effusion. There is a small right-sided hip joint effusion.  Bursae: There is asymmetric soft tissue edema surrounding the proximal left femur, including the adductor and proximal quadriceps musculature. There is no focal bursal fluid collection.  Muscles and tendons  Muscles and tendons: Asymmetric tendinosis of the left common hamstring tendon with reactive edema in the left ischial tuberosity. The quadriceps and iliopsoas tendons appear intact. As above, there is asymmetric soft tissue edema within the musculature of the proximal left thigh.  Other findings  Miscellaneous: Multiple uterine fibroids are noted. The pelvic ascites seen previously has essentially resolved.  IMPRESSION: 1. Moderate size left hip joint effusion with periarticular soft tissue edema suggesting inflammatory arthropathy and possible capsular injury. Small right hip joint effusion. 2. Left common hamstring tendinosis with reactive edema in the left ischial tuberosity. 3. No acute osseous findings. Specifically, no evidence of acute fracture or AVN. 4. Mild degenerative changes of both  hips, right greater than left.   Electronically Signed By: Richardean Sale M.D. On: 09/30/2018 10:26  Hip-L DG 2-3 views: Results for orders placed during the hospital encounter of 09/30/18 DG Hip Unilat W or Wo Pelvis 2-3 Views Left  Narrative CLINICAL DATA:  Dislocated hip.  EXAM: DG HIP (WITH OR WITHOUT PELVIS) 2-3V LEFT  COMPARISON:  CT 05/15/2018.  FINDINGS: Surgical coils noted the pelvis. Pelvic calcifications consistent phleboliths. No acute bony abnormality identified. No evidence of fracture or dislocation.  IMPRESSION: No acute abnormality  identified. No evidence of fracture or dislocation.   Electronically Signed By: Marcello Moores  Register On: 09/30/2018 08:04  Knee Imaging: Knee-L DG 1-2 views: Results for orders placed during the hospital encounter of 01/09/20 DG Knee 2 Views Left  Narrative CLINICAL DATA:  Left knee injury.  EXAM: LEFT KNEE - 1-2 VIEW  COMPARISON:  None.  FINDINGS: No evidence of fracture, dislocation, or joint effusion. No evidence of arthropathy or other focal bone abnormality. Soft tissues are unremarkable.  IMPRESSION: Negative.   Electronically Signed By: Marijo Conception M.D. On: 01/09/2020 14:27  Knee-L DG 4 views: Results for orders placed during the hospital encounter of 01/03/21 DG Knee Complete 4 Views Left  Narrative CLINICAL DATA:  Twisted knee 2 days ago.  Left knee pain.  EXAM: LEFT KNEE - COMPLETE 4+ VIEW  COMPARISON:  None.  FINDINGS: No evidence of fracture, dislocation, or joint effusion. No evidence of arthropathy or other focal bone abnormality. Soft tissues are unremarkable.  IMPRESSION: Negative.   Electronically Signed By: Keane Police D.O. On: 01/03/2021 15:34  Ankle Imaging: Ankle-L DG Complete: Results for orders placed during the hospital encounter of 01/09/20 DG Ankle Complete Left  Narrative CLINICAL DATA:  Left ankle fracture.  EXAM: LEFT ANKLE COMPLETE - 3+ VIEW  COMPARISON:  None.  FINDINGS: The left ankle has been casted and immobilized. No fracture or dislocation is noted. Joint spaces are intact.  IMPRESSION: No acute abnormality seen in the left ankle.   Electronically Signed By: Marijo Conception M.D. On: 01/09/2020 14:26  Complexity Note: Imaging results reviewed.                          Allergies  Ms. Citron has No Known Allergies.  Laboratory Chemistry Profile   Renal Lab Results  Component Value Date   BUN 8 09/02/2021   CREATININE 0.70 09/02/2021   LABCREA 79 09/02/2021   BCR SEE NOTE: 09/02/2021    GFR 97.61 03/08/2021   GFRAA >60 06/01/2019   GFRNONAA >60 07/30/2021   PROTEINUR 30 (A) 07/26/2021     Electrolytes Lab Results  Component Value Date   NA 141 09/02/2021   K 3.7 09/02/2021   CL 103 09/02/2021   CALCIUM 9.0 09/02/2021   MG 1.4 (L) 07/30/2021   PHOS 1.6 (L) 07/30/2021     Hepatic Lab Results  Component Value Date   AST 19 09/02/2021   ALT 20 09/02/2021   ALBUMIN 2.7 (L) 07/30/2021   ALKPHOS 46 07/30/2021   LIPASE 52 (H) 07/27/2021     ID Lab Results  Component Value Date   HIV Non Reactive 07/27/2021   SARSCOV2NAA NEGATIVE 09/01/2020   HCVAB 0.2 05/31/2018   PREGTESTUR NEGATIVE 02/21/2021     Bone No results found for: "VD25OH", "VD125OH2TOT", "OI7867EH2", "CN4709GG8", "25OHVITD1", "25OHVITD2", "25OHVITD3", "TESTOFREE", "TESTOSTERONE"   Endocrine Lab Results  Component Value Date   GLUCOSE 144 (H) 09/02/2021  GLUCOSEU NEGATIVE 07/26/2021   HGBA1C 6.7 (A) 09/02/2021   TSH 1.76 03/08/2021     Neuropathy Lab Results  Component Value Date   VITAMINB12 223 06/14/2018   FOLATE 9.5 06/14/2018   HGBA1C 6.7 (A) 09/02/2021   HIV Non Reactive 07/27/2021     CNS No results found for: "COLORCSF", "APPEARCSF", "RBCCOUNTCSF", "WBCCSF", "POLYSCSF", "LYMPHSCSF", "EOSCSF", "PROTEINCSF", "GLUCCSF", "JCVIRUS", "CSFOLI", "IGGCSF", "LABACHR", "ACETBL"   Inflammation (CRP: Acute  ESR: Chronic) No results found for: "CRP", "ESRSEDRATE", "LATICACIDVEN"   Rheumatology Lab Results  Component Value Date   ANA NEGATIVE 03/08/2021     Coagulation Lab Results  Component Value Date   INR 1.1 09/26/2018   LABPROT 14.5 09/26/2018   APTT 34 09/26/2018   PLT 215 07/30/2021     Cardiovascular Lab Results  Component Value Date   CKTOTAL 43 03/30/2019   HGB 12.7 07/30/2021   HCT 39.7 07/30/2021     Screening Lab Results  Component Value Date   SARSCOV2NAA NEGATIVE 09/01/2020   HCVAB 0.2 05/31/2018   HIV Non Reactive 07/27/2021   PREGTESTUR NEGATIVE  02/21/2021     Cancer No results found for: "CEA", "CA125", "LABCA2"   Allergens No results found for: "ALMOND", "APPLE", "ASPARAGUS", "AVOCADO", "BANANA", "BARLEY", "BASIL", "BAYLEAF", "GREENBEAN", "LIMABEAN", "WHITEBEAN", "BEEFIGE", "REDBEET", "BLUEBERRY", "BROCCOLI", "CABBAGE", "MELON", "CARROT", "CASEIN", "CASHEWNUT", "CAULIFLOWER", "CELERY"     Note: Lab results reviewed.  PFSH   Past Surgical History:  Procedure Laterality Date   BILIARY BRUSHING  08/22/2021   Procedure: BILIARY BRUSHING;  Surgeon: Mansouraty, Gabriel Jr., MD;  Location: [facility];  Service: Gastroenterology;;   BIOPSY  06/01/2021   Procedure: BIOPSY;  Surgeon: Mansouraty, Gabriel Jr., MD;  Location: [facility];  Service: Gastroenterology;;   CESAREAN SECTION     EMBOLIZATION N/A 06/14/2018   Procedure: Uterine Embolization;  Surgeon: Dew, Jason S, MD;  Location: [facility];  Service: Cardiovascular;  Laterality: N/A;   ENDOSCOPIC RETROGRADE CHOLANGIOPANCREATOGRAPHY (ERCP) WITH PROPOFOL N/A 08/22/2021   Procedure: ENDOSCOPIC RETROGRADE CHOLANGIOPANCREATOGRAPHY (ERCP) WITH PROPOFOL;  Surgeon: Mansouraty, Gabriel Jr., MD;  Location: [facility];  Service: Gastroenterology;  Laterality: N/A;   ESOPHAGOGASTRODUODENOSCOPY (EGD) WITH PROPOFOL N/A 06/01/2021   Procedure: ESOPHAGOGASTRODUODENOSCOPY (EGD) WITH PROPOFOL;  Surgeon: Mansouraty, Gabriel Jr., MD;  Location: [facility];  Service: Gastroenterology;  Laterality: N/A;   ESOPHAGOGASTRODUODENOSCOPY (EGD) WITH PROPOFOL N/A 08/22/2021   Procedure: ESOPHAGOGASTRODUODENOSCOPY (EGD) WITH PROPOFOL;  Surgeon: Mansouraty, Gabriel Jr., MD;  Location: [facility];  Service: Gastroenterology;  Laterality: N/A;   EUS N/A 06/01/2021   Procedure: UPPER ENDOSCOPIC ULTRASOUND (EUS) RADIAL;  Surgeon: Mansouraty, Gabriel Jr., MD;  Location: [facility];  Service: Gastroenterology;  Laterality: N/A;   EUS N/A 08/22/2021   Procedure: ESOPHAGEAL ENDOSCOPIC ULTRASOUND (EUS)  RADIAL;  Surgeon: Mansouraty, Gabriel Jr., MD;  Location: [facility];  Service: Gastroenterology;  Laterality: N/A;   laceration finger Right    LAPAROSCOPIC VAGINAL HYSTERECTOMY WITH SALPINGECTOMY Bilateral 10/28/2018   Procedure: LAPAROSCOPIC ASSISTED VAGINAL HYSTERECTOMY BILATERAL WITH SALPINGECTOMY;  Surgeon: Cherry, Anika, MD;  Location: [facility];  Service: Gynecology;  Laterality: Bilateral;   NEUROLYTIC CELIAC PLEXUS  08/22/2021   Procedure: NEUROLYTIC CELIAC PLEXUS;  Surgeon: Mansouraty, Gabriel Jr., MD;  Location: [facility];  Service: Gastroenterology;;   PANCREATIC STENT PLACEMENT  08/22/2021   Procedure: PANCREATIC STENT PLACEMENT;  Surgeon: Mansouraty, Gabriel Jr., MD;  Location: [facility];  Service: Gastroenterology;;   REMOVAL OF STONES  08/22/2021   Procedure: REMOVAL OF STONES;  Surgeon: Mansouraty, Gabriel Jr., MD;  Location: [facility];    Service: Gastroenterology;;   SPHINCTEROTOMY  08/22/2021   Procedure: SPHINCTEROTOMY;  Surgeon: Mansouraty, Gabriel Jr., MD;  Location: [facility];  Service: Gastroenterology;;   THYROIDECTOMY, PARTIAL     VAGINAL HYSTERECTOMY Bilateral 10/28/2018   Procedure: HYSTERECTOMY VAGINAL WITH BILATERAL SALPINGECTOMY;  Surgeon: Cherry, Anika, MD;  Location: [facility];  Service: Gynecology;  Laterality: Bilateral;   Active Ambulatory Problems    Diagnosis Date Noted   Swelling of limb 05/17/2018   Essential hypertension 05/17/2018   Pancreatitis, chronic (HCC) 05/17/2018   Portal vein thrombosis 05/17/2018   Symptomatic anemia 06/13/2018   Anticoagulated 10/04/2018   Pelvic pain 10/04/2018   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   History of partial thyroidectomy 10/04/2018   Status post laparoscopic hysterectomy 10/28/2018   Vaginal cuff cellulitis 11/04/2018   PONV (postoperative nausea and vomiting) 11/07/2018   GERD (gastroesophageal reflux disease) 03/30/2019   Tobacco use 03/30/2019   Abdominal pain 03/30/2019    Syncope 03/30/2019   Intractable vomiting with nausea 01/14/2020   Cannabis hyperemesis syndrome concurrent with and due to cannabis dependence (HCC) 01/15/2020   Acute on chronic pancreatitis (HCC) 09/01/2020   Nicotine dependence 09/01/2020   Non compliance w medication regimen 09/01/2020   Hypertensive urgency 09/01/2020   Pancreatic pseudocyst    Pulmonary nodule    Diabetes mellitus due to underlying condition with hyperglycemia, with long-term current use of insulin (HCC) 03/03/2021   Chronic pain syndrome 07/08/2021   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   Unintentional weight loss 07/08/2021   Pancreatic cyst 07/08/2021   Pancreatic duct stones 07/08/2021   Pancreatic duct dilated 07/08/2021   AKI (acute kidney injury) (HCC) 07/26/2021   Hypokalemia 07/26/2021   Hypercalcemia 07/26/2021   Hyponatremia 07/26/2021   UTI (urinary tract infection) 07/26/2021   Moderate protein-calorie malnutrition (HCC)    Protein-calorie malnutrition, severe 07/27/2021   Pharmacologic therapy 09/11/2021   Disorder of skeletal system 09/11/2021   Problems influencing health status 09/11/2021   Failure to attend appointment 09/12/2021   Resolved Ambulatory Problems    Diagnosis Date Noted   No Resolved Ambulatory Problems   Past Medical History:  Diagnosis Date   Anxiety    Coronary artery disease    Diabetes mellitus without complication (HCC)    Dyspnea    H/O blood clots    Hypertension    Liver abscess    Pancreatitis 2019   Thyroid disease    Uterine fibroid    Note by:  A , MD Date: 09/12/2021; Time: 10:51 AM 

## 2021-09-12 ENCOUNTER — Ambulatory Visit (HOSPITAL_BASED_OUTPATIENT_CLINIC_OR_DEPARTMENT_OTHER): Payer: Medicaid Other | Admitting: Pain Medicine

## 2021-09-12 DIAGNOSIS — M899 Disorder of bone, unspecified: Secondary | ICD-10-CM

## 2021-09-12 DIAGNOSIS — Z91199 Patient's noncompliance with other medical treatment and regimen due to unspecified reason: Secondary | ICD-10-CM | POA: Insufficient documentation

## 2021-09-12 DIAGNOSIS — G894 Chronic pain syndrome: Secondary | ICD-10-CM

## 2021-09-12 DIAGNOSIS — Z79899 Other long term (current) drug therapy: Secondary | ICD-10-CM

## 2021-09-12 DIAGNOSIS — Z789 Other specified health status: Secondary | ICD-10-CM

## 2021-09-13 ENCOUNTER — Other Ambulatory Visit: Payer: Medicaid Other | Admitting: Pharmacist

## 2021-09-13 ENCOUNTER — Telehealth: Payer: Self-pay | Admitting: Pharmacist

## 2021-09-13 NOTE — Progress Notes (Signed)
Attempted to contact patient for scheduled appointment for medication management. Left HIPAA compliant message for patient to return my call at their convenience.   Will also send MyChart message  Catie TJodi Mourning, PharmD, [facility] [phone]

## 2021-09-15 ENCOUNTER — Other Ambulatory Visit: Payer: Medicaid Other | Admitting: Pharmacist

## 2021-09-15 ENCOUNTER — Telehealth: Payer: Self-pay | Admitting: Pharmacist

## 2021-09-15 ENCOUNTER — Other Ambulatory Visit: Payer: Self-pay

## 2021-09-15 MED ORDER — INSULIN PEN NEEDLE 31G X 5 MM MISC
3 refills | Status: DC
  Filled 2021-09-15: qty 100, 34d supply, fill #0

## 2021-09-15 MED FILL — Lancets: 25 days supply | Qty: 100 | Fill #0 | Status: CN

## 2021-09-15 MED FILL — Glucose Blood Test Strip: 25 days supply | Qty: 100 | Fill #0 | Status: CN

## 2021-09-15 NOTE — Progress Notes (Signed)
Spoke with patient, see Patient Encounter.   Catie Hedwig Morton, PharmD, [facility] [phone]

## 2021-09-15 NOTE — Progress Notes (Signed)
Chief Complaint  Patient presents with   Diabetes    Amy Wolfe is a 46 y.o. year old female who presented for a telephone visit.   They were referred to the pharmacist by their PCP for assistance in managing diabetes and medication access.   Patient is participating in a Managed Medicaid Plan:  Yes  Subjective:  Care Team: Primary Care Provider: Teodora Medici, DO ; Next Scheduled Visit:   Medication Access/Adherence  Current Pharmacy:  [facility] [address]), [place] [facility] [place]) [place] [address] Phone: [phone] Fax: [place]. [place] [address] Phone: [phone] Fax: [phone]   Patient reports affordability concerns with their medications: Yes  Patient reports access/transportation concerns to their pharmacy: No  Patient reports adherence concerns with their medications:  Yes    Reports she has been unable to work because of her nausea/vomiting/abdominal pain, so has been unable to afford copays. Reports she asked [facility] about waiving Medicaid copay and they declined.    Diabetes:  Current medications: Levemir 6 units daily ~ after breakfast  Medications tried in the past: metformin ER - GI upset  Last c-peptide did show endogenous insulin production; however, concern for use of GLP1 given continued pancreatitis concerns, baseline GI upset; concern for SGLT2 given current nausea/vomiting and risk of dehydration precipitating euglycemic DKA  Current glucose readings: fastings: 110-130s; 2 hour post prandial: after lunch:  ~ 150s; after supper: ~200s  Patient denies hypoglycemic s/sx including dizziness, shakiness, sweating. Patient reports hyperglycemic symptoms including polyuria,  Current meal patterns:  - Breakfast: boiled egg, eggs and apple sauce; sometimes fruit ~ 7 am - Lunch: fruit, wrap,  - Supper: 8:30 - 9  pm; bland foods lately; rice, beans, chicken; higher at night  - Drinks: crystal lite,    Health Maintenance  Health Maintenance Due  Topic Date Due   COVID-19 Vaccine (1) Never done   OPHTHALMOLOGY EXAM  Never done   PAP SMEAR-Modifier  Never done   INFLUENZA VACCINE  Never done     Objective: Lab Results  Component Value Date   HGBA1C 6.7 (A) 09/02/2021    Lab Results  Component Value Date   CREATININE 0.70 09/02/2021   BUN 8 09/02/2021   NA 141 09/02/2021   K 3.7 09/02/2021   CL 103 09/02/2021   CO2 29 09/02/2021    Lab Results  Component Value Date   TRIG 127.0 10/05/2020    Medications Reviewed Today     Reviewed by Osker Mason, RPH-CPP (Pharmacist) on 09/15/21 at [phone]  Med List Status: <None>   Medication Order Taking? Sig Documenting Provider Last Dose Status Informant  ACCU-CHEK GUIDE test strip [id number] Yes USE TO CHECK BLOOD SUGAR UP TO 4 TIMES DAILY AS DIRECTED Teodora Medici, DO Taking Active   Accu-Chek Softclix Lancets lancets [id number] Yes USE TO CHECK BLOOD SUGAR UP TO 4 TIMES DAILY AS DIRECTED Teodora Medici, DO Taking Active   acetaminophen (TYLENOL) 500 MG tablet [id number] Yes Take 2 tablets (1,000 mg total) by mouth every 6 (six) hours as needed for mild pain, fever or headache (or Fever >/= 101). DO Not Take if taking the Percocet's as well Raiford Noble [place], DO Taking Active   blood glucose meter kit and supplies [id number] Yes Dispense based on patient and insurance preference. Use up to four times daily as directed. (FOR ICD-10 E10.9, E11.9). Teodora Medici, DO Taking  Active Self  blood glucose meter kit and supplies KIT [id number] Yes Dispense based on patient and insurance preference. Use up to four times daily as directed. Teodora Medici, DO Taking Active Self  insulin detemir (LEVEMIR FLEXPEN) 100 UNIT/ML FlexPen [id number] Yes Inject 6 Units into the skin daily. Teodora Medici, DO Taking Active   Insulin Pen Needle  (PEN NEEDLES) 30G X 8 MM MISC [id number] Yes 1 each by Does not apply route daily. Teodora Medici, DO Taking Active   lipase/protease/amylase (CREON) 36000 UNITS CPEP capsule [id number] Yes Take 1-2 capsules (36,000-72,000 Units total) by mouth See admin instructions. Take 2 capsules (72000u) by mouth three times daily with meals and take 1 capsule (36000u) by mouth twice daily with snacks Mansouraty, Telford Nab., MD Taking Active   Multiple Vitamins-Minerals (MULTIVITAMIN ADULT) CHEW [id number]  Chew 1 each by mouth daily. [provider]  Active Self  nicotine (NICODERM CQ - DOSED IN MG/24 HOURS) 21 mg/24hr patch [id number] No Place 1 patch (21 mg total) onto the skin daily.  Patient not taking: Reported on 09/02/2021   Kerney Elbe, DO Not Taking Active   Nutritional Supplements (,FEEDING SUPPLEMENT, PROSOURCE PLUS) liquid [id number] No Take 30 mLs by mouth 3 (three) times daily between meals.  Patient not taking: Reported on 09/15/2021   Kerney Elbe, DO Not Taking Active   ondansetron [facility]) 4 MG tablet [id number] Yes Take 1 tablet (4 mg total) by mouth every 6 (six) hours as needed for nausea. Raiford Noble [place], DO Taking Active   oxyCODONE-acetaminophen (PERCOCET/ROXICET) 5-325 MG tablet [id number] No Take 1 tablet by mouth every 4 (four) hours as needed for severe pain.  Patient not taking: Reported on 09/15/2021   Mansouraty, Telford Nab., MD Not Taking Active   pantoprazole (PROTONIX) 40 MG tablet [id number] Yes Take 1 tablet (40 mg total) by mouth 2 (two) times daily before a meal. Mansouraty, Telford Nab., MD Taking Active   polyethylene glycol (MIRALAX / GLYCOLAX) 17 g packet [id number] Yes Take 17 g by mouth daily. Sheikh, Omair [place], DO Taking Active   rivaroxaban (XARELTO) 20 MG TABS tablet [id number] No Take 1 tablet (20 mg total) by mouth daily with supper.  Patient not taking: Reported on 09/15/2021   Teodora Medici, DO Not Taking Active                Assessment/Plan:   Diabetes: - Currently uncontrolled due to medication access concerns - Reviewed long term cardiovascular and renal outcomes of uncontrolled blood sugar - Reviewed goal A1c, goal fasting, and goal 2 hour post prandial glucose - Recommend to eventually transition to Lantus for more stable duration of action, however, patient reports she has a pen and a half remaining at this time of Levemir.  - Given reports of higher post prandials after supper, would consider initiation of bolus insulin with supper. However, patient's diet is variable given GI disease. Would prefer use of CGM to help evaluate glucose patterns and monitor need for bolus insulin. Will complete PA to see if covered.   - Recommend to check glucose continuously if possible, otherwise continue checking fingerstick readings fasting and 2 hour post prandial.   Collaborated with RN for [facility]. She will follow up with schedulers regarding CT scheduling and referral to [facility]. Patient can present for x ray without an appointment.   Will collaborate with [place] at [facility] to transfer meds, set up with a charge account for patient to pay over time as able.  Follow Up Plan: phone call in 2 weeks  Catie TJodi Mourning, PharmD, [facility] [phone]

## 2021-09-15 NOTE — Progress Notes (Unsigned)
Patient requesting a refill on ondansetron- please send to pharmacy at [facility] (I have added to her chart).   She's transferring pharmacies so the refill request won't come electronically. Thanks!  Catie Hedwig Morton, PharmD, [facility] [phone]

## 2021-09-16 ENCOUNTER — Other Ambulatory Visit (HOSPITAL_COMMUNITY): Payer: Self-pay

## 2021-09-16 ENCOUNTER — Other Ambulatory Visit: Payer: Self-pay | Admitting: Pharmacist

## 2021-09-16 ENCOUNTER — Other Ambulatory Visit: Payer: Self-pay

## 2021-09-16 MED ORDER — ONDANSETRON HCL 8 MG PO TABS
8.0000 mg | ORAL_TABLET | Freq: Three times a day (TID) | ORAL | 6 refills | Status: DC | PRN
  Filled 2021-09-16: qty 30, 10d supply, fill #0

## 2021-09-16 NOTE — Progress Notes (Signed)
Care Coordination Call  Collaborated with pharmacy. Copay for pen needles is $0. Notified patient and she will go pick up today.   Dr. Donneta Romberg office to send script for ondansetron to [place] at [facility].   Per Cover My Meds, Elenor Legato 3 PA was denied, likely because patient is only on one dose of insulin daily. Encouraged patient to continue fingerstick glucose monitoring, checking fasting and 2 hour post prandial. We will review results in 2 weeks to determine if bolus insulin is needed.  Catie Hedwig Morton, PharmD, [facility] [phone]

## 2021-09-16 NOTE — Telephone Encounter (Signed)
Rx for Zofran '8mg'$  one every 8 hours asa needed

## 2021-09-16 NOTE — Patient Instructions (Signed)
Amy Wolfe   The [place] [facility] has your pen needle prescription ready [phone]; [place]).   Check your blood sugars two to three times daily:  1) Fasting, first thing in the morning before breakfast and  2) 2 hours after meals. You can alternate - some days check after lunch, some days check after supper, etc.   Write down these readings for me in a list like this:   Date Fasting After Breakfast After Lunch After Supper  9/15      9/16      9/17      9/18      9/19      9/20      9/21       We'll review these readings in 2 weeks!  Catie Hedwig Morton, PharmD, [facility] [phone]

## 2021-09-16 NOTE — Telephone Encounter (Signed)
Thank you for letting us know. [name], Can you send an updated prescription of Zofran for the patient to the updated pharmacy. Zofran 8 mg every 8 hours as needed 30/6 refills. Thanks. GM

## 2021-09-21 ENCOUNTER — Other Ambulatory Visit (HOSPITAL_COMMUNITY): Payer: Self-pay

## 2021-09-23 ENCOUNTER — Other Ambulatory Visit (HOSPITAL_COMMUNITY): Payer: Self-pay

## 2021-09-23 ENCOUNTER — Other Ambulatory Visit: Payer: Self-pay

## 2021-09-23 MED FILL — Lancets: 25 days supply | Qty: 100 | Fill #0 | Status: CN

## 2021-09-23 MED FILL — Glucose Blood Test Strip: 25 days supply | Qty: 100 | Fill #0 | Status: AC

## 2021-09-26 ENCOUNTER — Other Ambulatory Visit: Payer: Self-pay

## 2021-09-27 ENCOUNTER — Ambulatory Visit: Admission: RE | Admit: 2021-09-27 | Payer: Medicaid Other | Source: Ambulatory Visit

## 2021-09-27 ENCOUNTER — Other Ambulatory Visit: Payer: Self-pay

## 2021-09-27 ENCOUNTER — Other Ambulatory Visit: Payer: Medicaid Other

## 2021-09-28 ENCOUNTER — Other Ambulatory Visit: Payer: Medicaid Other | Admitting: Pharmacist

## 2021-09-28 ENCOUNTER — Telehealth: Payer: Self-pay | Admitting: Pharmacist

## 2021-09-29 NOTE — Progress Notes (Signed)
Attempted to contact patient for scheduled appointment for medication management. Left HIPAA compliant message for patient to return my call at their convenience.   Catie T. Winfrey Chillemi, PharmD, BCACP [facility] [phone]  

## 2021-10-03 ENCOUNTER — Ambulatory Visit: Payer: Medicaid Other | Admitting: Pain Medicine

## 2021-10-05 ENCOUNTER — Telehealth: Payer: Self-pay

## 2021-10-05 NOTE — Chronic Care Management (AMB) (Signed)
   Care Guide Note  10/05/2021 Name: Lashauna Arpin MRN: [id number] DOB: 23-Nov-1975  Referred by: Teodora Medici, DO Reason for referral : Care Coordination (Outreach to reschedule f/u with Pharm D )   Cailie Wonda Goodgame is a 46 y.o. year old female who is a primary care patient of Teodora Medici, DO. Tamyra Raianna Slight was referred to the pharmacist for assistance related to DM.    A second unsuccessful telephone outreach was attempted today to contact the patient who was referred to the pharmacy team for assistance with medication management. Additional attempts will be made to contact the patient.  Noreene Larsson, [place], [place] [address] Direct Dial: [phone] [email or web address]

## 2021-10-14 NOTE — Chronic Care Management (AMB) (Signed)
   Care Guide Note  10/14/2021 Name: Amy Wolfe MRN: [id number] DOB: 1975/09/16  Referred by: Teodora Medici, DO Reason for referral : Care Coordination (Outreach to reschedule f/u with Pharm D )   Amy Wolfe is a 46 y.o. year old female who is a primary care patient of Teodora Medici, DO. Amy Wolfe was referred to the pharmacist for assistance related to DM.    A third unsuccessful telephone outreach was attempted today to contact the patient who was referred to the pharmacy team for assistance with medication management. The Population Health team is pleased to engage with this patient at any time in the future upon receipt of referral and should he/she be interested in assistance from the [facility] team.   Noreene Larsson, [place], [place] [address] Direct Dial: [phone] [email or web address]

## 2021-10-14 NOTE — Telephone Encounter (Signed)
3rd unsuccessful outreach  

## 2021-10-15 DIAGNOSIS — E119 Type 2 diabetes mellitus without complications: Secondary | ICD-10-CM

## 2021-10-15 DIAGNOSIS — E43 Unspecified severe protein-calorie malnutrition: Secondary | ICD-10-CM

## 2021-10-15 DIAGNOSIS — T310 Burns involving less than 10% of body surface: Secondary | ICD-10-CM | POA: Insufficient documentation

## 2021-10-16 ENCOUNTER — Observation Stay
Admission: EM | Admit: 2021-10-16 | Discharge: 2021-10-17 | Disposition: A | Discharge status: Home / Self Care | Payer: Medicaid Other | Attending: Internal Medicine | Admitting: Internal Medicine

## 2021-10-16 ENCOUNTER — Other Ambulatory Visit: Payer: Self-pay

## 2021-10-16 DIAGNOSIS — I2510 Atherosclerotic heart disease of native coronary artery without angina pectoris: Secondary | ICD-10-CM | POA: Diagnosis not present

## 2021-10-16 DIAGNOSIS — Y93E1 Activity, personal bathing and showering: Secondary | ICD-10-CM | POA: Insufficient documentation

## 2021-10-16 DIAGNOSIS — R1013 Epigastric pain: Secondary | ICD-10-CM | POA: Diagnosis present

## 2021-10-16 DIAGNOSIS — X118XXA Contact with other hot tap-water, initial encounter: Secondary | ICD-10-CM | POA: Insufficient documentation

## 2021-10-16 DIAGNOSIS — T300 Burn of unspecified body region, unspecified degree: Secondary | ICD-10-CM

## 2021-10-16 DIAGNOSIS — E119 Type 2 diabetes mellitus without complications: Secondary | ICD-10-CM

## 2021-10-16 DIAGNOSIS — Z7901 Long term (current) use of anticoagulants: Secondary | ICD-10-CM | POA: Insufficient documentation

## 2021-10-16 DIAGNOSIS — Z794 Long term (current) use of insulin: Secondary | ICD-10-CM | POA: Insufficient documentation

## 2021-10-16 DIAGNOSIS — Z79899 Other long term (current) drug therapy: Secondary | ICD-10-CM | POA: Diagnosis not present

## 2021-10-16 DIAGNOSIS — T2124XA Burn of second degree of lower back, initial encounter: Secondary | ICD-10-CM | POA: Diagnosis not present

## 2021-10-16 DIAGNOSIS — R112 Nausea with vomiting, unspecified: Principal | ICD-10-CM | POA: Diagnosis present

## 2021-10-16 DIAGNOSIS — I81 Portal vein thrombosis: Secondary | ICD-10-CM | POA: Diagnosis present

## 2021-10-16 DIAGNOSIS — K861 Other chronic pancreatitis: Secondary | ICD-10-CM | POA: Insufficient documentation

## 2021-10-16 DIAGNOSIS — Z86718 Personal history of other venous thrombosis and embolism: Secondary | ICD-10-CM | POA: Diagnosis not present

## 2021-10-16 DIAGNOSIS — F17210 Nicotine dependence, cigarettes, uncomplicated: Secondary | ICD-10-CM | POA: Insufficient documentation

## 2021-10-16 DIAGNOSIS — R1110 Vomiting, unspecified: Secondary | ICD-10-CM | POA: Diagnosis present

## 2021-10-16 DIAGNOSIS — I10 Essential (primary) hypertension: Secondary | ICD-10-CM | POA: Diagnosis not present

## 2021-10-16 DIAGNOSIS — K860 Alcohol-induced chronic pancreatitis: Secondary | ICD-10-CM | POA: Diagnosis present

## 2021-10-16 DIAGNOSIS — K8689 Other specified diseases of pancreas: Secondary | ICD-10-CM | POA: Diagnosis present

## 2021-10-16 LAB — URINALYSIS, ROUTINE W REFLEX MICROSCOPIC
Bilirubin Urine: NEGATIVE
Glucose, UA: NEGATIVE mg/dL
Ketones, ur: 20 mg/dL — AB
Nitrite: NEGATIVE
Protein, ur: 30 mg/dL — AB
Specific Gravity, Urine: 1.04 — ABNORMAL HIGH (ref 1.005–1.030)
pH: 5 (ref 5.0–8.0)

## 2021-10-16 LAB — COMPREHENSIVE METABOLIC PANEL
ALT: 21 U/L (ref 0–44)
AST: 26 U/L (ref 15–41)
Albumin: 4.1 g/dL (ref 3.5–5.0)
Alkaline Phosphatase: 86 U/L (ref 38–126)
Anion gap: 10 (ref 5–15)
BUN: 18 mg/dL (ref 6–20)
CO2: 28 mmol/L (ref 22–32)
Calcium: 9.3 mg/dL (ref 8.9–10.3)
Chloride: 99 mmol/L (ref 98–111)
Creatinine, Ser: 0.91 mg/dL (ref 0.44–1.00)
GFR, Estimated: >60 mL/min (ref >60)
Glucose, Bld: 151 mg/dL — ABNORMAL HIGH (ref 70–99)
Potassium: 3.9 mmol/L (ref 3.5–5.1)
Sodium: 137 mmol/L (ref 135–145)
Total Bilirubin: 0.8 mg/dL (ref 0.3–1.2)
Total Protein: 7.8 g/dL (ref 6.5–8.1)

## 2021-10-16 LAB — CBC
HCT: 52.1 % — ABNORMAL HIGH (ref 36.0–46.0)
Hemoglobin: 16.5 g/dL — ABNORMAL HIGH (ref 12.0–15.0)
MCH: 27.1 pg (ref 26.0–34.0)
MCHC: 31.7 g/dL (ref 30.0–36.0)
MCV: 85.6 fL (ref 80.0–100.0)
Platelets: 254 10*3/uL (ref 150–400)
RBC: 6.09 MIL/uL — ABNORMAL HIGH (ref 3.87–5.11)
RDW: 12.8 % (ref 11.5–15.5)
WBC: 7.7 10*3/uL (ref 4.0–10.5)
nRBC: 0 % (ref 0.0–0.2)

## 2021-10-16 LAB — LACTIC ACID, PLASMA
Lactic Acid, Venous: 1.6 mmol/L (ref 0.5–1.9)
Lactic Acid, Venous: 1.8 mmol/L (ref 0.5–1.9)
Lactic Acid, Venous: 1.9 mmol/L (ref 0.5–1.9)

## 2021-10-16 LAB — CBG MONITORING, ED
Glucose-Capillary: 133 mg/dL — ABNORMAL HIGH (ref 70–99)
Glucose-Capillary: 80 mg/dL (ref 70–99)
Glucose-Capillary: 146 mg/dL — ABNORMAL HIGH (ref 70–99)
Glucose-Capillary: 212 mg/dL — ABNORMAL HIGH (ref 70–99)
Glucose-Capillary: 157 mg/dL — ABNORMAL HIGH (ref 70–99)

## 2021-10-16 LAB — LIPASE, BLOOD: Lipase: 32 U/L (ref 11–51)

## 2021-10-16 LAB — ETHANOL: Alcohol, Ethyl (B): <10 mg/dL (ref <10)

## 2021-10-16 MED ORDER — PANTOPRAZOLE SODIUM 40 MG IV SOLR
40.0000 mg | INTRAVENOUS | Status: DC
  Administered 2021-10-16 – 2021-10-17 (×2): 40 mg via INTRAVENOUS
  Filled 2021-10-16 (×2): qty 10

## 2021-10-16 MED ORDER — ONDANSETRON HCL 4 MG/2ML IJ SOLN
4.0000 mg | Freq: Once | INTRAMUSCULAR | Status: AC | PRN
  Administered 2021-10-16: 4 mg via INTRAVENOUS
  Filled 2021-10-16: qty 2

## 2021-10-16 MED ORDER — ONDANSETRON HCL 4 MG/2ML IJ SOLN
4.0000 mg | Freq: Once | INTRAMUSCULAR | Status: AC
  Administered 2021-10-16: 4 mg via INTRAVENOUS
  Filled 2021-10-16: qty 2

## 2021-10-16 MED ORDER — ONDANSETRON HCL 4 MG/2ML IJ SOLN
4.0000 mg | Freq: Four times a day (QID) | INTRAMUSCULAR | Status: DC | PRN
  Administered 2021-10-16 – 2021-10-17 (×2): 4 mg via INTRAVENOUS
  Filled 2021-10-16 (×2): qty 2

## 2021-10-16 MED ORDER — MORPHINE SULFATE (PF) 2 MG/ML IV SOLN: 2.0000 mg | INTRAVENOUS | Status: DC | PRN

## 2021-10-16 MED ORDER — PANCRELIPASE (LIP-PROT-AMYL) 36000-114000 UNITS PO CPEP
72000.0000 [IU] | ORAL_CAPSULE | Freq: Three times a day (TID) | ORAL | Status: DC
  Administered 2021-10-16: 72000 [IU] via ORAL
  Administered 2021-10-17: 36000 [IU] via ORAL
  Filled 2021-10-16 (×6): qty 2

## 2021-10-16 MED ORDER — ACETAMINOPHEN 325 MG PO TABS
650.0000 mg | ORAL_TABLET | Freq: Four times a day (QID) | ORAL | Status: DC | PRN
  Administered 2021-10-17 (×2): 650 mg via ORAL
  Filled 2021-10-16 (×2): qty 2

## 2021-10-16 MED ORDER — PANCRELIPASE (LIP-PROT-AMYL) 36000-114000 UNITS PO CPEP: 36000.0000 [IU] | ORAL_CAPSULE | Freq: Two times a day (BID) | ORAL | Status: DC | PRN

## 2021-10-16 MED ORDER — HYDROMORPHONE HCL 1 MG/ML IJ SOLN
1.0000 mg | INTRAMUSCULAR | Status: DC | PRN
  Administered 2021-10-16 – 2021-10-17 (×9): 1 mg via INTRAVENOUS
  Filled 2021-10-16 (×9): qty 1

## 2021-10-16 MED ORDER — ACETAMINOPHEN 325 MG RE SUPP: 650.0000 mg | Freq: Four times a day (QID) | RECTAL | Status: DC | PRN

## 2021-10-16 MED ORDER — SODIUM CHLORIDE 0.9 % IV BOLUS
1000.0000 mL | Freq: Once | INTRAVENOUS | Status: AC
  Administered 2021-10-16: 1000 mL via INTRAVENOUS

## 2021-10-16 MED ORDER — ONDANSETRON HCL 4 MG PO TABS: 4.0000 mg | ORAL_TABLET | Freq: Four times a day (QID) | ORAL | Status: DC | PRN

## 2021-10-16 MED ORDER — HYDROMORPHONE HCL 1 MG/ML IJ SOLN
1.0000 mg | Freq: Once | INTRAMUSCULAR | Status: AC
  Administered 2021-10-16: 1 mg via INTRAVENOUS
  Filled 2021-10-16: qty 1

## 2021-10-16 MED ORDER — OXYCODONE-ACETAMINOPHEN 5-325 MG PO TABS
1.0000 | ORAL_TABLET | ORAL | Status: DC | PRN
  Administered 2021-10-17 (×3): 1 via ORAL
  Filled 2021-10-16 (×3): qty 1

## 2021-10-16 MED ORDER — RIVAROXABAN 20 MG PO TABS
20.0000 mg | ORAL_TABLET | Freq: Every day | ORAL | Status: DC
  Administered 2021-10-16: 20 mg via ORAL
  Filled 2021-10-16 (×2): qty 1

## 2021-10-16 MED ORDER — INSULIN ASPART 100 UNIT/ML IJ SOLN: 0.0000 [IU] | Freq: Every day | INTRAMUSCULAR | Status: DC

## 2021-10-16 MED ORDER — ONDANSETRON HCL 4 MG PO TABS: 8.0000 mg | ORAL_TABLET | Freq: Three times a day (TID) | ORAL | Status: DC | PRN

## 2021-10-16 MED ORDER — SODIUM CHLORIDE 0.9 % IV SOLN: 12.5000 mg | Freq: Four times a day (QID) | INTRAVENOUS | Status: DC | PRN

## 2021-10-16 MED ORDER — INSULIN ASPART 100 UNIT/ML IJ SOLN
0.0000 [IU] | Freq: Three times a day (TID) | INTRAMUSCULAR | Status: DC
  Administered 2021-10-16 (×2): 1 [IU] via SUBCUTANEOUS
  Administered 2021-10-17: 5 [IU] via SUBCUTANEOUS
  Administered 2021-10-17: 1 [IU] via SUBCUTANEOUS
  Filled 2021-10-16 (×4): qty 1

## 2021-10-16 MED ORDER — SODIUM CHLORIDE 0.9 % IV SOLN
12.5000 mg | Freq: Four times a day (QID) | INTRAVENOUS | Status: DC | PRN
  Administered 2021-10-16 – 2021-10-17 (×2): 12.5 mg via INTRAVENOUS
  Filled 2021-10-16: qty 12.5
  Filled 2021-10-16: qty 0.5

## 2021-10-16 MED ORDER — SODIUM CHLORIDE 0.9 % IV SOLN: INTRAVENOUS | Status: DC

## 2021-10-16 MED ORDER — PANTOPRAZOLE SODIUM 40 MG PO TBEC: 40.0000 mg | DELAYED_RELEASE_TABLET | Freq: Two times a day (BID) | ORAL | Status: DC
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
## 2021-10-16 NOTE — Assessment & Plan Note (Signed)
Continue Xarelto 
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
## 2021-10-16 NOTE — Assessment & Plan Note (Addendum)
Abdominal pain with history of chronic pancreatitis with pancreatic duct stones Suspect related to her pancreatic duct stones. Lipase normal. Denies current alcohol use Follow-up UA to evaluate for UTI.  History of Klebsiella UTI in August Pain control IV hydration, IV antiemetics, IV Protonix Clear liquids to advance as tolerated

## 2021-10-16 NOTE — H&P (Addendum)
History and Physical    Patient: Amy Wolfe TZG:[id number] DOB: March 11, 1975 DOA: 10/16/2021 DOS: the patient was seen and examined on 10/16/2021 PCP: Teodora Medici, DO  Patient coming from: Home  Chief Complaint:  Chief Complaint  Patient presents with   Abdominal Pain    HPI: Amy Wolfe is a 46 y.o. 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 Patient was in the ED at [facility] but left because she was boarded there for 15 hours.  She also has a second-degree burn on her back from lying in hot water in the shower trying to relieve the pain. The burns were treated while at [facility].  Denies, fever or chills, diarrhea or dysuria. ED course and data review: BP 148/104 with otherwise normal vitals.  Labs with normal WBC, normal lipase and LFTs, normal lactic acid.  Urinalysis pending.  EKG, personally viewed and interpreted showing NSR at 84 with no acute ST-T wave changes. Patient had a CT abdomen and pelvis at [facility] on 10/14 that showed the following: IMPRESSION:  --Sequelae of chronic pancreatitis with coarse pancreatic parenchymal calcifications and diffuse main pancreatic ductal dilation and sidebranch dilation. This may represent sequelae of chronic stricturing of the main pancreatic duct at the level of the pancreatic head.   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   -- 0.5 cm pulmonary nodule in the right lower lobe. Follow-up is not required if patient is low risk. Follow-up chest CT in 1 year could be considered if patient is high risk per fleischner guidelines    Patient treated with an IV fluid bolus, Phenergan, Zofran and hydromorphone and hospitalist consulted for admission.   Review of Systems: As mentioned in the history of present illness. All other systems reviewed and are negative.  Past Medical History:  Diagnosis Date   Abdominal pain    Anxiety    Coronary artery disease    Diabetes mellitus without complication (HCC)    Dyspnea    GERD (gastroesophageal reflux disease)    H/O blood clots    Hypertension    Liver abscess    Pancreatitis 2019   PONV (postoperative nausea and vomiting) 11/07/2018   Thyroid disease    Uterine fibroid    Past Surgical History:  Procedure Laterality Date   BILIARY BRUSHING  08/22/2021   Procedure: BILIARY BRUSHING;  Surgeon: Irving Copas., MD;  Location: [facility];  Service: Gastroenterology;;   BIOPSY  06/01/2021   Procedure: BIOPSY;  Surgeon: Irving Copas., MD;  Location: [facility];  Service: Gastroenterology;;   CESAREAN SECTION     EMBOLIZATION N/A 06/14/2018   Procedure: Uterine Embolization;  Surgeon: Algernon Huxley, MD;  Location: [facility];  Service: Cardiovascular;  Laterality: N/A;   ENDOSCOPIC RETROGRADE CHOLANGIOPANCREATOGRAPHY (ERCP) WITH PROPOFOL N/A 08/22/2021   Procedure: ENDOSCOPIC RETROGRADE CHOLANGIOPANCREATOGRAPHY (ERCP) WITH PROPOFOL;  Surgeon: Rush Landmark Telford Nab., MD;  Location: [facility];  Service: Gastroenterology;  Laterality: N/A;   ESOPHAGOGASTRODUODENOSCOPY (EGD) WITH PROPOFOL N/A 06/01/2021   Procedure: ESOPHAGOGASTRODUODENOSCOPY (EGD) WITH PROPOFOL;  Surgeon: Rush Landmark Telford Nab., MD;  Location: [facility];  Service: Gastroenterology;  Laterality: N/A;   ESOPHAGOGASTRODUODENOSCOPY (EGD) WITH PROPOFOL N/A 08/22/2021  Procedure: ESOPHAGOGASTRODUODENOSCOPY (EGD) WITH PROPOFOL;  Surgeon: Rush Landmark Telford Nab., MD;  Location: [facility];  Service: Gastroenterology;  Laterality: N/A;   EUS N/A 06/01/2021   Procedure: UPPER  ENDOSCOPIC ULTRASOUND (EUS) RADIAL;  Surgeon: Irving Copas., MD;  Location: [facility];  Service: Gastroenterology;  Laterality: N/A;   EUS N/A 08/22/2021   Procedure: ESOPHAGEAL ENDOSCOPIC ULTRASOUND (EUS) RADIAL;  Surgeon: Rush Landmark Telford Nab., MD;  Location: [facility];  Service: Gastroenterology;  Laterality: N/A;   laceration finger Right    LAPAROSCOPIC VAGINAL HYSTERECTOMY WITH SALPINGECTOMY Bilateral 10/28/2018   Procedure: LAPAROSCOPIC ASSISTED VAGINAL HYSTERECTOMY BILATERAL WITH SALPINGECTOMY;  Surgeon: Rubie Maid, MD;  Location: [facility];  Service: Gynecology;  Laterality: Bilateral;   NEUROLYTIC CELIAC PLEXUS  08/22/2021   Procedure: NEUROLYTIC CELIAC PLEXUS;  Surgeon: Rush Landmark Telford Nab., MD;  Location: [facility];  Service: Gastroenterology;;   PANCREATIC STENT PLACEMENT  08/22/2021   Procedure: PANCREATIC STENT PLACEMENT;  Surgeon: Irving Copas., MD;  Location: [facility];  Service: Gastroenterology;;   REMOVAL OF STONES  08/22/2021   Procedure: REMOVAL OF STONES;  Surgeon: Irving Copas., MD;  Location: [facility];  Service: Gastroenterology;;   [name]  08/22/2021   Procedure: [name];  Surgeon: Irving Copas., MD;  Location: [facility];  Service: Gastroenterology;;   THYROIDECTOMY, PARTIAL     VAGINAL HYSTERECTOMY Bilateral 10/28/2018   Procedure: HYSTERECTOMY VAGINAL WITH BILATERAL SALPINGECTOMY;  Surgeon: Rubie Maid, MD;  Location: [facility];  Service: Gynecology;  Laterality: Bilateral;   Social History:  reports that she has been smoking cigarettes. She has a 17.50 pack-year smoking history. She has never used smokeless tobacco. She reports that she does not currently use alcohol. She reports current drug use. Drug: Marijuana.  No Known Allergies  Family History  Problem Relation Age of Onset   Hypertension Mother    Diabetes Mother    Brain cancer Father    Aneurysm Brother    Bone cancer  Maternal Grandmother    Lung cancer Maternal Grandfather    Cancer Paternal Grandmother    Cancer Paternal Grandfather    Colon cancer Neg Hx    Esophageal cancer Neg Hx    Inflammatory bowel disease Neg Hx    Liver disease Neg Hx    Pancreatic cancer Neg Hx    Rectal cancer Neg Hx    Stomach cancer Neg Hx     Prior to Admission medications   Medication Sig Start Date End Date Taking? Authorizing Provider  Accu-Chek Softclix Lancets lancets USE TO CHECK BLOOD SUGAR UP TO 4 TIMES DAILY AS DIRECTED 08/05/21   Teodora Medici, DO  acetaminophen (TYLENOL) 500 MG tablet Take 2 tablets (1,000 mg total) by mouth every 6 (six) hours as needed for mild pain, fever or headache (or Fever >/= 101). DO Not Take if taking the Percocet's as well 07/30/21   Raiford Noble Latif, DO  blood glucose meter kit and supplies KIT Dispense based on patient and insurance preference. Use up to four times daily as directed. 03/24/21   Teodora Medici, DO  blood glucose meter kit and supplies Dispense based on patient and insurance preference. Use up to four times daily as directed. (FOR ICD-10 E10.9, E11.9). 03/28/21   Teodora Medici, DO  glucose blood (ACCU-CHEK GUIDE) test strip USE TO CHECK BLOOD SUGAR UP TO 4 TIMES DAILY AS DIRECTED 08/05/21   Teodora Medici, DO  insulin detemir (LEVEMIR FLEXPEN) 100 UNIT/ML FlexPen Inject 6 Units into the skin daily. 09/02/21   Teodora Medici,  DO  Insulin Pen Needle (PEN NEEDLES) 30G X 8 MM MISC 1 each by Does not apply route daily. 09/02/21   Teodora Medici, DO  Insulin Pen Needle 31G X 5 MM MISC use with levemir flexpen daily 09/02/21   Teodora Medici, DO  lipase/protease/amylase (CREON) 36000 UNITS CPEP capsule Take 1-2 capsules (36,000-72,000 Units total) by mouth See admin instructions. Take 2 capsules (72000u) by mouth three times daily with meals and take 1 capsule (36000u) by mouth twice daily with snacks 08/22/21   Mansouraty, Telford Nab., MD  Multiple  Vitamins-Minerals (MULTIVITAMIN ADULT) CHEW Chew 1 each by mouth daily.    [provider]  nicotine (NICODERM CQ - DOSED IN MG/24 HOURS) 21 mg/24hr patch Place 1 patch (21 mg total) onto the skin daily. Patient not taking: Reported on 09/02/2021 07/31/21   Raiford Noble Latif, DO  Nutritional Supplements (,FEEDING SUPPLEMENT, PROSOURCE PLUS) liquid Take 30 mLs by mouth 3 (three) times daily between meals. Patient not taking: Reported on 09/15/2021 07/30/21   Raiford Noble Latif, DO  ondansetron [facility]) 8 MG tablet Take 1 tablet (8 mg total) by mouth every 8 (eight) hours as needed for nausea or vomiting. 09/16/21   Mansouraty, Telford Nab., MD  oxyCODONE-acetaminophen (PERCOCET/ROXICET) 5-325 MG tablet Take 1 tablet by mouth every 4 (four) hours as needed for severe pain. Patient not taking: Reported on 09/15/2021 08/22/21   Mansouraty, Telford Nab., MD  pantoprazole (PROTONIX) 40 MG tablet Take 1 tablet (40 mg total) by mouth 2 (two) times daily before a meal. 08/22/21   Mansouraty, Telford Nab., MD  polyethylene glycol (MIRALAX / GLYCOLAX) 17 g packet Take 17 g by mouth daily. 07/31/21   Raiford Noble Latif, DO  rivaroxaban (XARELTO) 20 MG TABS tablet Take 1 tablet (20 mg total) by mouth daily with supper. Patient not taking: Reported on 09/15/2021 09/02/21   Teodora Medici, DO    Physical Exam: Vitals:   10/16/21 0232 10/16/21 0234  BP:  (!) 148/104  Pulse:  91  Resp: 18   Temp:  98.5 F (36.9 C)  TempSrc: Oral Oral  SpO2:  96%  Weight:  59 kg  Height:  _0  (1.702 m)   Physical Exam Vitals and nursing note reviewed.  Constitutional:      General: She is not in acute distress.    Comments: Appears to be in pain  HENT:     Head: Normocephalic and atraumatic.  Cardiovascular:     Rate and Rhythm: Normal rate and regular rhythm.     Heart sounds: Normal heart sounds.  Pulmonary:     Effort: Pulmonary effort is normal.     Breath sounds: Normal breath sounds.  Abdominal:      Palpations: Abdomen is soft.     Tenderness: There is abdominal tenderness.  Skin:    Comments: Dressing on back  Neurological:     Mental Status: Mental status is at baseline.     Labs on Admission: I have personally reviewed following labs and imaging studies  CBC: Recent Labs  Lab 10/16/21 0249  WBC 7.7  HGB 16.5*  HCT 52.1*  MCV 85.6  PLT 546   Basic Metabolic Panel: Recent Labs  Lab 10/16/21 0249  NA 137  K 3.9  CL 99  CO2 28  GLUCOSE 151*  BUN 18  CREATININE 0.91  CALCIUM 9.3   GFR: Estimated Creatinine Clearance: 74.2 mL/min (by C-G formula based on SCr of 0.91 mg/dL). Liver Function Tests: Recent Labs  Lab  10/16/21 0249  AST 26  ALT 21  ALKPHOS 86  BILITOT 0.8  PROT 7.8  ALBUMIN 4.1   Recent Labs  Lab 10/16/21 0249  LIPASE 32   No results for input(s): "AMMONIA" in the last 168 hours. Coagulation Profile: No results for input(s): "INR", "PROTIME" in the last 168 hours. Cardiac Enzymes: No results for input(s): "CKTOTAL", "CKMB", "CKMBINDEX", "TROPONINI" in the last 168 hours. BNP (last 3 results) No results for input(s): "PROBNP" in the last 8760 hours. HbA1C: No results for input(s): "HGBA1C" in the last 72 hours. CBG: No results for input(s): "GLUCAP" in the last 168 hours. Lipid Profile: No results for input(s): "CHOL", "HDL", "LDLCALC", "TRIG", "CHOLHDL", "LDLDIRECT" in the last 72 hours. Thyroid Function Tests: No results for input(s): "TSH", "T4TOTAL", "FREET4", "T3FREE", "THYROIDAB" in the last 72 hours. Anemia Panel: No results for input(s): "VITAMINB12", "FOLATE", "FERRITIN", "TIBC", "IRON", "RETICCTPCT" in the last 72 hours. Urine analysis:    Component Value Date/Time   COLORURINE YELLOW 07/26/2021 1729   APPEARANCEUR HAZY (A) 07/26/2021 1729   LABSPEC 1.015 07/26/2021 1729   PHURINE 5.0 07/26/2021 1729   GLUCOSEU NEGATIVE 07/26/2021 1729   HGBUR MODERATE (A) 07/26/2021 1729   BILIRUBINUR NEGATIVE 07/26/2021 1729    KETONESUR 5 (A) 07/26/2021 1729   PROTEINUR 30 (A) 07/26/2021 1729   NITRITE POSITIVE (A) 07/26/2021 1729   LEUKOCYTESUR MODERATE (A) 07/26/2021 1729    Radiological Exams on Admission: No results found.   Data Reviewed: Relevant notes from primary care and specialist visits, past discharge summaries as available in EHR, including Care Everywhere. Prior diagnostic testing as pertinent to current admission diagnoses Updated medications and problem lists for reconciliation ED course, including vitals, labs, imaging, treatment and response to treatment Triage notes, nursing and pharmacy notes and ED provider's notes Notable results as noted in HPI   Assessment and Plan: * Intractable vomiting with nausea Abdominal pain with history of chronic pancreatitis with pancreatic duct stones Suspect related to her pancreatic duct stones. Lipase normal. Denies current alcohol use Follow-up UA to evaluate for UTI.  History of Klebsiella UTI in August Pain control IV hydration, IV antiemetics, IV Protonix Clear liquids to advance as tolerated  Pancreatic duct stones Alcohol-induced chronic pancreatitis S/p ERCP 08/2019 with pancreatic and biliary sphincterotomy and placement of temporary plastic pancreatic stent without stone removal Lipase and LFTs WNL CT abdomen and pelvis done at [facility] on 10/14 showing stable findings Continue Creon  Second degree burn of back Less than 2% body surface area Patient states she was sleeping on the floor of a hot shower Wound care consult  Portal vein thrombosis Continue Xarelto  Diabetes mellitus without complication (HCC) Sliding scale insulin coverage        DVT prophylaxis: Xarelto  Consults: none  Advance Care Planning:   Code Status: Prior   Family Communication: none  Disposition Plan: Back to previous home environment  Severity of Illness: The appropriate patient status for this patient is OBSERVATION. Observation status is judged  to be reasonable and necessary in order to provide the required intensity of service to ensure the patient's safety. The patient's presenting symptoms, physical exam findings, and initial radiographic and laboratory data in the context of their medical condition is felt to place them at decreased risk for further clinical deterioration. Furthermore, it is anticipated that the patient will be medically stable for discharge from the hospital within 2 midnights of admission.   Author: Athena Masse, MD 10/16/2021 4:32 AM  For on call  review [email or web address].

## 2021-10-16 NOTE — ED Notes (Signed)
Report given to [place], RN

## 2021-10-16 NOTE — Assessment & Plan Note (Signed)
Sliding scale insulin coverage 

## 2021-10-16 NOTE — ED Triage Notes (Signed)
Pt with abd pain and nauesa/vomiting since the 13th. Pt moaning in triage, history of pancreatitis. Pt denies known fever.

## 2021-10-16 NOTE — Assessment & Plan Note (Signed)
Less than 2% body surface area Patient states she was sleeping on the floor of a hot shower Wound care consult

## 2021-10-16 NOTE — Assessment & Plan Note (Addendum)
Alcohol-induced chronic pancreatitis S/p ERCP 08/2019 with pancreatic and biliary sphincterotomy and placement of temporary plastic pancreatic stent without stone removal Lipase and LFTs WNL CT abdomen and pelvis done at [facility] on 10/14 showing stable findings Continue Creon

## 2021-10-17 ENCOUNTER — Other Ambulatory Visit: Payer: Self-pay

## 2021-10-17 ENCOUNTER — Inpatient Hospital Stay: Payer: Medicaid Other

## 2021-10-17 ENCOUNTER — Observation Stay: Payer: Medicaid Other

## 2021-10-17 DIAGNOSIS — R1110 Vomiting, unspecified: Secondary | ICD-10-CM | POA: Diagnosis present

## 2021-10-17 DIAGNOSIS — R112 Nausea with vomiting, unspecified: Secondary | ICD-10-CM | POA: Diagnosis not present

## 2021-10-17 DIAGNOSIS — T2123XA Burn of second degree of upper back, initial encounter: Secondary | ICD-10-CM | POA: Diagnosis not present

## 2021-10-17 DIAGNOSIS — Y92012 Bathroom of single-family (private) house as the place of occurrence of the external cause: Secondary | ICD-10-CM | POA: Diagnosis not present

## 2021-10-17 DIAGNOSIS — X111XXA Contact with running hot water, initial encounter: Secondary | ICD-10-CM | POA: Diagnosis not present

## 2021-10-17 DIAGNOSIS — T300 Burn of unspecified body region, unspecified degree: Secondary | ICD-10-CM

## 2021-10-17 LAB — URINE DRUG SCREEN, QUALITATIVE (ARMC ONLY)
Amphetamines, Ur Screen: NOT DETECTED
Barbiturates, Ur Screen: NOT DETECTED
Benzodiazepine, Ur Scrn: NOT DETECTED
Cannabinoid 50 Ng, Ur ~~LOC~~: POSITIVE — AB
Cocaine Metabolite,Ur ~~LOC~~: POSITIVE — AB
MDMA (Ecstasy)Ur Screen: NOT DETECTED
Methadone Scn, Ur: NOT DETECTED
Opiate, Ur Screen: POSITIVE — AB
Phencyclidine (PCP) Ur S: NOT DETECTED
Tricyclic, Ur Screen: NOT DETECTED

## 2021-10-17 LAB — CBG MONITORING, ED
Glucose-Capillary: 264 mg/dL — ABNORMAL HIGH (ref 70–99)
Glucose-Capillary: 130 mg/dL — ABNORMAL HIGH (ref 70–99)

## 2021-10-17 MED ORDER — AMLODIPINE BESYLATE 5 MG PO TABS
10.0000 mg | ORAL_TABLET | Freq: Every day | ORAL | Status: DC
  Administered 2021-10-17: 10 mg via ORAL
  Filled 2021-10-17: qty 2

## 2021-10-17 MED ORDER — SILVER SULFADIAZINE 1 % EX CREA: TOPICAL_CREAM | Freq: Two times a day (BID) | CUTANEOUS | 0 refills | Status: DC

## 2021-10-17 MED ORDER — AMLODIPINE BESYLATE 5 MG PO TABS
5.0000 mg | ORAL_TABLET | Freq: Every day | ORAL | 0 refills | Status: DC
  Filled 2021-10-17: qty 30, 30d supply, fill #0

## 2021-10-17 MED ORDER — SILVER SULFADIAZINE 1 % EX CREA
TOPICAL_CREAM | Freq: Two times a day (BID) | CUTANEOUS | Status: DC
  Administered 2021-10-17: 1 via TOPICAL
  Filled 2021-10-17 (×2): qty 85
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
## 2021-10-17 NOTE — ED Notes (Signed)
Pt given sandwich tray and drink, okayed by admitting MD.

## 2021-10-17 NOTE — ED Notes (Signed)
Val Riles, WOC RN responded deferring to surgery at [facility], and signed off after speaking with admitting MD.
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
## 2021-10-17 NOTE — ED Notes (Signed)
EDP Dr. Jacelyn Grip into room to reduce dislocated R shoulder. Shoulder reduced without sedation, without complications. Pt cooperative. Verbalizes "popped back in, feel better". CMS intact. Will continue to monitor. Post xray ordered/ pending. Placed in shoulder immobilizer.
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
## 2021-10-17 NOTE — ED Provider Notes (Signed)
   [facility] Provider Note    Event Date/Time   First MD Initiated Contact with Patient 10/16/21 0600     (approximate)    .Ortho Injury Treatment  Date/Time: 10/17/2021 2:05 PM  Performed by: Lucillie Garfinkel, MD Authorized by: Lucillie Garfinkel, MD   Consent:    Consent obtained:  Verbal   Consent given by:  Patient   Risks discussed:  Fracture, restricted joint movement, recurrent dislocation and irreducible dislocation   Alternatives discussed:  No treatmentInjury location: shoulder Location details: right shoulder Injury type: dislocation Dislocation type: anterior Hill-Sachs deformity: no Chronicity: recurrent Pre-procedure neurovascular assessment: neurovascularly intact  Anesthesia: Local anesthesia used: no  Patient sedated: NoManipulation performed: yes Reduction method: scapular manipulation and external rotation (traction) Reduction successful: yes Immobilization: sling Post-procedure neurovascular assessment: post-procedure neurovascularly intact        Lucillie Garfinkel, MD 10/17/21 1405

## 2021-10-17 NOTE — ED Notes (Signed)
Xray complete, pending results, no changes.

## 2021-10-17 NOTE — ED Notes (Addendum)
Pt verbalizes: she just dislocated her R shoulder, says it happens all the time, shes trying to self reduce it like she usually does. Deformity and guarding present. CMS intact. ROM limited. Admitting MD notified.

## 2021-10-17 NOTE — Progress Notes (Signed)
TOC received consult for outpatient wound clinic referral, CSW updated MD on this consult to make referral in dc paperwork for patient to call and schedule apt.   No further dc needs at this time.   [place], [place]

## 2021-10-18 ENCOUNTER — Other Ambulatory Visit: Payer: Self-pay

## 2021-10-21 ENCOUNTER — Telehealth: Payer: Self-pay
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
## 2021-10-25 NOTE — Patient Instructions (Signed)
____________________________________________________________________________________________  New Patients  Welcome to [facility].   Initial Visit The first or initial visit consists of an evaluation only.   Interventional pain management.  We offer therapies other than opioid controlled substances to manage chronic pain. These include, but are not limited to, diagnostic, therapeutic, and palliative specialized injection therapies (i.e.: Epidural Steroids, Facet Blocks, etc.). We specialize in a variety of nerve blocks as well as radiofrequency treatments. We offer pain implant evaluations and trials, as well as follow up management. In addition we also provide a variety joint injections, including Viscosupplementation (AKA: Gel Therapy).  Prescription Pain Medication We provide evaluations for/of pharmacologic therapies. Recommendations will follow CDC Guidelines.  We no longer take patients for long-term medication management. We will not be taking over your pain medications.  ____________________________________________________________________________________________  ____________________________________________________________________________________________  Patient Information update  To: All of our patients.  Re: Name change.  It has been made official that our current name, "[facility]"   will soon be changed to "[facility]".   The purpose of this change is to eliminate any confusion created by the concept of our practice being a "Medication Management Pain Clinic". In the past this has led to the misconception that we treat pain primarily by the use of prescription medications.  Nothing can be farther from the truth.   Understanding PAIN MANAGEMENT: To further understand what our practice does, you first  have to understand that "Pain Management" is a subspecialty that requires additional training once a physician has completed their specialty training, which can be in either Anesthesia, Neurology, Psychiatry, or Physical Medicine and Rehabilitation (PMR). Each one of these contributes to the final approach taken by each physician to the management of their patient's pain. To be a "Pain Management Specialist" you must have first completed one of the specialty trainings below.  Anesthesiologists - trained in clinical pharmacology and interventional techniques such as nerve blockade and regional as well as central neuroanatomy. They are trained to block pain before, during, and after surgical interventions.  Neurologists - trained in the diagnosis and pharmacological treatment of complex neurological conditions, such as Multiple Sclerosis, Parkinson's, spinal cord injuries, and other systemic conditions that may be associated with symptoms that may include but are not limited to pain. They tend to rely primarily on the treatment of chronic pain using prescription medications.  Psychiatrist - trained in conditions affecting the psychosocial wellbeing of patients including but not limited to depression, anxiety, schizophrenia, personality disorders, addiction, and other substance use disorders that may be associated with chronic pain. They tend to rely primarily on the treatment of chronic pain using prescription medications.   Physical Medicine and Rehabilitation (PMR) physicians, also known as physiatrists - trained to treat a wide variety of medical conditions affecting the brain, spinal cord, nerves, bones, joints, ligaments, muscles, and tendons. Their training is primarily aimed at treating patients that have suffered injuries that have caused severe physical impairment. Their training is primarily aimed at the physical therapy and rehabilitation of those patients. They may also work alongside orthopedic  surgeons or neurosurgeons using their expertise in assisting surgical patients to recover after their surgeries.  INTERVENTIONAL PAIN MANAGEMENT is sub-subspecialty of Pain Management.  Our physicians are Board-certified in Anesthesia, Pain Management, and Interventional Pain Management.  This meaning that not only have they been trained and Board-certified in their specialty of Anesthesia, and subspecialty of  Pain Management, but they have also received further training in the sub-subspecialty of Interventional Pain Management, in order to become Board-certified as INTERVENTIONAL PAIN MANAGEMENT SPECIALIST.    Mission: Our goal is to use our skills in  INTERVENTIONAL PAIN MANAGEMENT as alternatives to the chronic use of prescription opioid medications for the treatment of pain. To make this more clear, we have changed our name to reflect what we do and offer. We will continue to offer medication management assessment and recommendations, but we will not be taking over any patient's medication management.  ____________________________________________________________________________________________   ______________________________________________________________________________________________  Specialty Pain Scale  Introduction:  There are significant differences in how pain is reported. The word pain usually refers to physical pain, but it is also a common synonym of suffering. The medical community uses a scale from 0 (zero) to 10 (ten) to report pain level. Zero (0) is described as "no pain", while ten (10) is described as "the worse pain you can imagine". The problem with this scale is that physical pain is reported along with suffering. Suffering refers to mental pain, or more often yet it refers to any unpleasant feeling, emotion or aversion associated with the perception of harm or threat of harm. It is the psychological component of pain.  Pain Specialists prefer to separate the two  components. The pain scale used by this practice is the Verbal Numerical Rating Scale (VNRS-11). This scale is for the physical pain only. DO NOT INCLUDE how your pain psychologically affects you. This scale is for adults 52 years of age and older. It has 11 (eleven) levels. The 1st level is 0/10. This means: "right now, I have no pain". In the context of pain management, it also means: "right now, my physical pain is under control with the current therapy".  General Information:  The scale should reflect your current level of pain. Unless you are specifically asked for the level of your worst pain, or your average pain. If you are asked for one of these two, then it should be understood that it is over the past 24 hours.  Levels 1 (one) through 5 (five) are described below, and can be treated as an outpatient. Ambulatory pain management facilities such as ours are more than adequate to treat these levels. Levels 6 (six) through 10 (ten) are also described below, however, these must be treated as a hospitalized patient. While levels 6 (six) and 7 (seven) may be evaluated at an urgent care facility, levels 8 (eight) through 10 (ten) constitute medical emergencies and as such, they belong in a hospital's emergency department. When having these levels (as described below), do not come to our office. Our facility is not equipped to manage these levels. Go directly to an urgent care facility or an emergency department to be evaluated.  Definitions:  Activities of Daily Living (ADL): Activities of daily living (ADL or ADLs) is a term used in healthcare to refer to people's daily self-care activities. Health professionals often use a person's ability or inability to perform ADLs as a measurement of their functional status, particularly in regard to people post injury, with disabilities and the elderly. There are two ADL levels: Basic and Instrumental. Basic Activities of Daily Living (BADL  or BADLs) consist of  self-care tasks that include: Bathing and showering; personal hygiene and grooming (including brushing/combing/styling hair); dressing; Toilet hygiene (getting to the toilet, cleaning oneself, and getting back up); eating and self-feeding (not including cooking or chewing and swallowing); functional mobility, often referred  to as "transferring", as measured by the ability to walk, get in and out of bed, and get into and out of a chair; the broader definition (moving from one place to another while performing activities) is useful for people with different physical abilities who are still able to get around independently. Basic ADLs include the things many people do when they get up in the morning and get ready to go out of the house: get out of bed, go to the toilet, bathe, dress, groom, and eat. On the average, loss of function typically follows a particular order. Hygiene is the first to go, followed by loss of toilet use and locomotion. The last to go is the ability to eat. When there is only one remaining area in which the person is independent, there is a 62.9% chance that it is eating and only a 3.5% chance that it is hygiene. Instrumental Activities of Daily Living (IADL or IADLs) are not necessary for fundamental functioning, but they let an individual live independently in a community. IADL consist of tasks that include: cleaning and maintaining the house; home establishment and maintenance; care of others (including selecting and supervising caregivers); care of pets; child rearing; managing money; managing financials (investments, etc.); meal preparation and cleanup; shopping for groceries and necessities; moving within the community; safety procedures and emergency responses; health management and maintenance (taking prescribed medications); and using the telephone or other form of communication.  Instructions:  Most patients tend to report their pain as a combination of two factors, their physical  pain and their psychosocial pain. This last one is also known as "suffering" and it is reflection of how physical pain affects you socially and psychologically. From now on, report them separately.  From this point on, when asked to report your pain level, report only your physical pain. Use the following table for reference.  Pain Clinic Pain Levels (0-5/10)  Pain Level Score  Description  No Pain 0   Mild pain 1 Nagging, annoying, but does not interfere with basic activities of daily living (ADL). Patients are able to eat, bathe, get dressed, toileting (being able to get on and off the toilet and perform personal hygiene functions), transfer (move in and out of bed or a chair without assistance), and maintain continence (able to control bladder and bowel functions). Blood pressure and heart rate are unaffected. A normal heart rate for a healthy adult ranges from 60 to 100 bpm (beats per minute).   Mild to moderate pain 2 Noticeable and distracting. Impossible to hide from other people. More frequent flare-ups. Still possible to adapt and function close to normal. It can be very annoying and may have occasional stronger flare-ups. With discipline, patients may get used to it and adapt.   Moderate pain 3 Interferes significantly with activities of daily living (ADL). It becomes difficult to feed, bathe, get dressed, get on and off the toilet or to perform personal hygiene functions. Difficult to get in and out of bed or a chair without assistance. Very distracting. With effort, it can be ignored when deeply involved in activities.   Moderately severe pain 4 Impossible to ignore for more than a few minutes. With effort, patients may still be able to manage work or participate in some social activities. Very difficult to concentrate. Signs of autonomic nervous system discharge are evident: dilated pupils (mydriasis); mild sweating (diaphoresis); sleep interference. Heart rate becomes elevated (>115 bpm).  Diastolic blood pressure (lower number) rises above 100 mmHg. Patients find  relief in laying down and not moving.   Severe pain 5 Intense and extremely unpleasant. Associated with frowning face and frequent crying. Pain overwhelms the senses.  Ability to do any activity or maintain social relationships becomes significantly limited. Conversation becomes difficult. Pacing back and forth is common, as getting into a comfortable position is nearly impossible. Pain wakes you up from deep sleep. Physical signs will be obvious: pupillary dilation; increased sweating; goosebumps; brisk reflexes; cold, clammy hands and feet; nausea, vomiting or dry heaves; loss of appetite; significant sleep disturbance with inability to fall asleep or to remain asleep. When persistent, significant weight loss is observed due to the complete loss of appetite and sleep deprivation.  Blood pressure and heart rate becomes significantly elevated. Caution: If elevated blood pressure triggers a pounding headache associated with blurred vision, then the patient should immediately seek attention at an urgent or emergency care unit, as these may be signs of an impending stroke.    Emergency Department Pain Levels (6-10/10)  Emergency Room Pain 6 Severely limiting. Requires emergency care and should not be seen or managed at an outpatient pain management facility. Communication becomes difficult and requires great effort. Assistance to reach the emergency department may be required. Facial flushing and profuse sweating along with potentially dangerous increases in heart rate and blood pressure will be evident.   Distressing pain 7 Self-care is very difficult. Assistance is required to transport, or use restroom. Assistance to reach the emergency department will be required. Tasks requiring coordination, such as bathing and getting dressed become very difficult.   Disabling pain 8 Self-care is no longer possible. At this level, pain is  disabling. The individual is unable to do even the most "basic" activities such as walking, eating, bathing, dressing, transferring to a bed, or toileting. Fine motor skills are lost. It is difficult to think clearly.   Incapacitating pain 9 Pain becomes incapacitating. Thought processing is no longer possible. Difficult to remember your own name. Control of movement and coordination are lost.   The worst pain imaginable 10 At this level, most patients pass out from pain. When this level is reached, collapse of the autonomic nervous system occurs, leading to a sudden drop in blood pressure and heart rate. This in turn results in a temporary and dramatic drop in blood flow to the brain, leading to a loss of consciousness. Fainting is one of the body's self defense mechanisms. Passing out puts the brain in a calmed state and causes it to shut down for a while, in order to begin the healing process.    Summary: 1.   Refer to this scale when providing [place] with your pain level. 2.   Be accurate and careful when reporting your pain level. This will help with your care. 3.   Over-reporting your pain level will lead to loss of credibility. 4.   Even a level of 1/10 means that there is pain and will be treated at our facility. 5.   High, inaccurate reporting will be documented as "Symptom Exaggeration", leading to loss of credibility and suspicions of possible secondary gains such as obtaining more narcotics, or wanting to appear disabled, for fraudulent reasons. 6.   Only pain levels of 5 or below will be seen at our facility. 7.   Pain levels of 6 and above will be sent to the Emergency Department and the appointment cancelled.  ______________________________________________________________________________________________

## 2021-10-25 NOTE — Progress Notes (Unsigned)
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    Pancreatitis 2019   Thyroid disease    Uterine fibroid    Constitutional Exam  General appearance: Well nourished, well developed, and well hydrated. In no apparent acute distress There were no vitals filed for this visit. BMI Assessment: Estimated body mass index is 20.36 kg/m as calculated from the following:   Height as of 10/16/21: '5\' 7"'  (1.702 m).   Weight as of 10/16/21: 130 lb (59 kg).  BMI interpretation table: BMI level Category Range association with higher incidence of chronic pain  <18 kg/m2 Underweight   18.5-24.9 kg/m2 Ideal body weight   25-29.9 kg/m2 Overweight Increased incidence by 20%  30-34.9 kg/m2 Obese (Class I) Increased incidence by 68%  35-39.9 kg/m2 Severe obesity (Class II) Increased incidence by 136%  >40 kg/m2 Extreme obesity  (Class III) Increased incidence by 254%   Patient's current BMI Ideal Body weight  There is no height or weight on file to calculate BMI. Ideal body weight: 61.6 kg (135 lb 12.9 oz)   BMI Readings from Last 4 Encounters:  10/16/21 20.36 kg/m  09/02/21 24.75 kg/m  08/22/21 19.42 kg/m  07/26/21 20.05 kg/m   Wt Readings from Last 4 Encounters:  10/16/21 130 lb (59 kg)  09/02/21 158 lb (71.7 kg)  08/22/21 124 lb (56.2 kg)  07/26/21 128 lb (58.1 kg)    Psych/Mental status: Alert, oriented x 3 (person, place, & time)       Eyes: PERLA Respiratory: No evidence of acute respiratory distress  Assessment  Primary Diagnosis & Pertinent Problem List: The primary encounter diagnosis was Chronic pain syndrome. Diagnoses of Pharmacologic therapy, Disorder of  skeletal system, and Problems influencing health status were also pertinent to this visit.  Visit Diagnosis (New problems to examiner): 1. Chronic pain syndrome   2. Pharmacologic therapy   3. Disorder of skeletal system   4. Problems influencing health status    Plan of Care (Initial workup plan)  Note: Amy Wolfe was reminded that as per protocol, today's visit has been an evaluation only. We have not taken over the patient's controlled substance management.  Problem-specific plan: No problem-specific Assessment & Plan notes found for this encounter.  Lab Orders  No laboratory test(s) ordered today   Imaging Orders  No imaging studies ordered today   Referral Orders  No referral(s) requested today   Procedure Orders    No procedure(s) ordered today   Pharmacotherapy (current): Medications ordered:  No orders of the defined types were placed in this encounter.  Medications administered during this visit: Amy Wolfe had no medications administered during this visit.   Analgesic Pharmacological management:  Opioid Analgesics: We no longer take patients for opioid medication management. If requested, Amy Wolfe  will be evaluated for this type of pharmacotherapy. The evaluation will assess the risks and indications of the therapy, as they apply to this particular patient. We may provide recommendations on medication, dose, schedule, and monitoring. The prescribing physician will ultimately decide, based on his/her training and level of comfort whether to adopt any of the recommendations, including the prescribing of such medicines.  Membrane stabilizer: To be determined at a later time  Muscle relaxant: To be determined at a later time  NSAID: To be determined at a later time  Other analgesic(s): To be determined at a later time   Interventional management options: Amy Wolfe was informed that there is no guarantee that she would be a candidate for interventional therapies. The decision will be based on the results of diagnostic studies, as well as Amy Wolfe's risk profile.  Procedure(s) under consideration:  Pending results of ordered studies      Interventional Therapies  Risk  Complexity Considerations:   Estimated body mass index is 20.36 kg/m as calculated from the following:   Height as of 10/16/21: '5\' 7"'  (1.702 m).   Weight as of 10/16/21: 130 lb (59 kg). WNL   Planned  Pending:   See above for possible orders   Under consideration:   Pending completion of evaluation   Completed:   None at this time   Completed by other providers:   None at this time   Therapeutic  Palliative (PRN) options:   None established      Provider-requested follow-up: No follow-ups on file.  Future Appointments  Date Time Provider [place]  10/26/2021 11:00 AM Milinda Pointer, MD [facility] None    Note by: Gaspar Cola, MD Date: 10/26/2021; Time: 1:29 PM

## 2021-10-26 ENCOUNTER — Encounter: Payer: Self-pay | Admitting: Pain Medicine

## 2021-10-26 ENCOUNTER — Ambulatory Visit: Payer: Medicaid Other | Attending: Pain Medicine | Admitting: Pain Medicine

## 2021-10-26 DIAGNOSIS — Z79899 Other long term (current) drug therapy: Secondary | ICD-10-CM

## 2021-10-26 DIAGNOSIS — M899 Disorder of bone, unspecified: Secondary | ICD-10-CM

## 2021-10-26 DIAGNOSIS — G894 Chronic pain syndrome: Secondary | ICD-10-CM

## 2021-10-26 DIAGNOSIS — Z91199 Patient's noncompliance with other medical treatment and regimen due to unspecified reason: Secondary | ICD-10-CM

## 2021-10-26 DIAGNOSIS — Z789 Other specified health status: Secondary | ICD-10-CM

## 2021-10-31 ENCOUNTER — Other Ambulatory Visit: Payer: Self-pay

## 2021-10-31 ENCOUNTER — Encounter (INDEPENDENT_AMBULATORY_CARE_PROVIDER_SITE_OTHER): Payer: Self-pay

## 2021-10-31 ENCOUNTER — Inpatient Hospital Stay
Admission: EM | Admit: 2021-10-31 | Discharge: 2021-11-03 | DRG: 393 | Disposition: A | Discharge status: Other Acute Inpatient Hospital | Payer: Medicaid Other | Attending: Internal Medicine | Admitting: Internal Medicine

## 2021-10-31 DIAGNOSIS — Z79899 Other long term (current) drug therapy: Secondary | ICD-10-CM

## 2021-10-31 DIAGNOSIS — F1910 Other psychoactive substance abuse, uncomplicated: Secondary | ICD-10-CM

## 2021-10-31 DIAGNOSIS — K861 Other chronic pancreatitis: Secondary | ICD-10-CM | POA: Diagnosis present

## 2021-10-31 DIAGNOSIS — R45851 Suicidal ideations: Secondary | ICD-10-CM | POA: Diagnosis present

## 2021-10-31 DIAGNOSIS — Z8249 Family history of ischemic heart disease and other diseases of the circulatory system: Secondary | ICD-10-CM

## 2021-10-31 DIAGNOSIS — F1090 Alcohol use, unspecified, uncomplicated: Secondary | ICD-10-CM

## 2021-10-31 DIAGNOSIS — I2510 Atherosclerotic heart disease of native coronary artery without angina pectoris: Secondary | ICD-10-CM | POA: Diagnosis present

## 2021-10-31 DIAGNOSIS — E1169 Type 2 diabetes mellitus with other specified complication: Secondary | ICD-10-CM

## 2021-10-31 DIAGNOSIS — E876 Hypokalemia: Secondary | ICD-10-CM | POA: Diagnosis present

## 2021-10-31 DIAGNOSIS — Z046 Encounter for general psychiatric examination, requested by authority: Secondary | ICD-10-CM

## 2021-10-31 DIAGNOSIS — F322 Major depressive disorder, single episode, severe without psychotic features: Secondary | ICD-10-CM | POA: Diagnosis present

## 2021-10-31 DIAGNOSIS — Z833 Family history of diabetes mellitus: Secondary | ICD-10-CM

## 2021-10-31 DIAGNOSIS — E119 Type 2 diabetes mellitus without complications: Secondary | ICD-10-CM | POA: Diagnosis present

## 2021-10-31 DIAGNOSIS — Z86718 Personal history of other venous thrombosis and embolism: Secondary | ICD-10-CM

## 2021-10-31 DIAGNOSIS — K8689 Other specified diseases of pancreas: Secondary | ICD-10-CM | POA: Diagnosis present

## 2021-10-31 DIAGNOSIS — I81 Portal vein thrombosis: Secondary | ICD-10-CM | POA: Diagnosis present

## 2021-10-31 DIAGNOSIS — F1410 Cocaine abuse, uncomplicated: Secondary | ICD-10-CM | POA: Diagnosis present

## 2021-10-31 DIAGNOSIS — Z20822 Contact with and (suspected) exposure to covid-19: Secondary | ICD-10-CM | POA: Diagnosis present

## 2021-10-31 DIAGNOSIS — R112 Nausea with vomiting, unspecified: Secondary | ICD-10-CM | POA: Diagnosis present

## 2021-10-31 DIAGNOSIS — I10 Essential (primary) hypertension: Secondary | ICD-10-CM | POA: Diagnosis present

## 2021-10-31 DIAGNOSIS — Y907 Blood alcohol level of 200-239 mg/100 ml: Secondary | ICD-10-CM | POA: Diagnosis present

## 2021-10-31 DIAGNOSIS — F17210 Nicotine dependence, cigarettes, uncomplicated: Secondary | ICD-10-CM | POA: Diagnosis present

## 2021-10-31 DIAGNOSIS — Z90710 Acquired absence of both cervix and uterus: Secondary | ICD-10-CM

## 2021-10-31 DIAGNOSIS — R1115 Cyclical vomiting syndrome unrelated to migraine: Principal | ICD-10-CM | POA: Diagnosis present

## 2021-10-31 DIAGNOSIS — K219 Gastro-esophageal reflux disease without esophagitis: Secondary | ICD-10-CM | POA: Diagnosis present

## 2021-10-31 DIAGNOSIS — Z79891 Long term (current) use of opiate analgesic: Secondary | ICD-10-CM

## 2021-10-31 DIAGNOSIS — F419 Anxiety disorder, unspecified: Secondary | ICD-10-CM | POA: Diagnosis present

## 2021-10-31 DIAGNOSIS — F1010 Alcohol abuse, uncomplicated: Secondary | ICD-10-CM | POA: Insufficient documentation

## 2021-10-31 DIAGNOSIS — G8929 Other chronic pain: Secondary | ICD-10-CM | POA: Diagnosis present

## 2021-10-31 DIAGNOSIS — Z808 Family history of malignant neoplasm of other organs or systems: Secondary | ICD-10-CM

## 2021-10-31 DIAGNOSIS — Z7901 Long term (current) use of anticoagulants: Secondary | ICD-10-CM

## 2021-10-31 DIAGNOSIS — Z794 Long term (current) use of insulin: Secondary | ICD-10-CM

## 2021-10-31 DIAGNOSIS — Z801 Family history of malignant neoplasm of trachea, bronchus and lung: Secondary | ICD-10-CM

## 2021-10-31 DIAGNOSIS — Z91199 Patient's noncompliance with other medical treatment and regimen due to unspecified reason: Secondary | ICD-10-CM

## 2021-10-31 LAB — BASIC METABOLIC PANEL
Anion gap: 11 (ref 5–15)
BUN: 10 mg/dL (ref 6–20)
CO2: 27 mmol/L (ref 22–32)
Calcium: 9.5 mg/dL (ref 8.9–10.3)
Chloride: 103 mmol/L (ref 98–111)
Creatinine, Ser: 0.62 mg/dL (ref 0.44–1.00)
GFR, Estimated: >60 mL/min (ref >60)
Glucose, Bld: 168 mg/dL — ABNORMAL HIGH (ref 70–99)
Potassium: 3.3 mmol/L — ABNORMAL LOW (ref 3.5–5.1)
Sodium: 141 mmol/L (ref 135–145)

## 2021-10-31 LAB — CBC WITH DIFFERENTIAL/PLATELET
Abs Immature Granulocytes: 0.02 10*3/uL (ref 0.00–0.07)
Basophils Absolute: 0 10*3/uL (ref 0.0–0.1)
Basophils Relative: 1 %
Eosinophils Absolute: 0.1 10*3/uL (ref 0.0–0.5)
Eosinophils Relative: 2 %
HCT: 50.1 % — ABNORMAL HIGH (ref 36.0–46.0)
Hemoglobin: 15.8 g/dL — ABNORMAL HIGH (ref 12.0–15.0)
Immature Granulocytes: 0 %
Lymphocytes Relative: 31 %
Lymphs Abs: 1.7 10*3/uL (ref 0.7–4.0)
MCH: 27.1 pg (ref 26.0–34.0)
MCHC: 31.5 g/dL (ref 30.0–36.0)
MCV: 85.9 fL (ref 80.0–100.0)
Monocytes Absolute: 0.5 10*3/uL (ref 0.1–1.0)
Monocytes Relative: 9 %
Neutro Abs: 3.1 10*3/uL (ref 1.7–7.7)
Neutrophils Relative %: 57 %
Platelets: 408 10*3/uL — ABNORMAL HIGH (ref 150–400)
RBC: 5.83 MIL/uL — ABNORMAL HIGH (ref 3.87–5.11)
RDW: 13.6 % (ref 11.5–15.5)
WBC: 5.4 10*3/uL (ref 4.0–10.5)
nRBC: 0 % (ref 0.0–0.2)

## 2021-10-31 LAB — URINE DRUG SCREEN, QUALITATIVE (ARMC ONLY)
Amphetamines, Ur Screen: NOT DETECTED
Barbiturates, Ur Screen: NOT DETECTED
Benzodiazepine, Ur Scrn: NOT DETECTED
Cannabinoid 50 Ng, Ur ~~LOC~~: POSITIVE — AB
Cocaine Metabolite,Ur ~~LOC~~: POSITIVE — AB
MDMA (Ecstasy)Ur Screen: NOT DETECTED
Methadone Scn, Ur: NOT DETECTED
Opiate, Ur Screen: NOT DETECTED
Phencyclidine (PCP) Ur S: NOT DETECTED
Tricyclic, Ur Screen: NOT DETECTED

## 2021-10-31 LAB — COMPREHENSIVE METABOLIC PANEL
ALT: 26 U/L (ref 0–44)
AST: 35 U/L (ref 15–41)
Albumin: 4.3 g/dL (ref 3.5–5.0)
Alkaline Phosphatase: 104 U/L (ref 38–126)
Anion gap: 10 (ref 5–15)
BUN: 10 mg/dL (ref 6–20)
CO2: 25 mmol/L (ref 22–32)
Calcium: 9.2 mg/dL (ref 8.9–10.3)
Chloride: 103 mmol/L (ref 98–111)
Creatinine, Ser: 0.61 mg/dL (ref 0.44–1.00)
GFR, Estimated: >60 mL/min (ref >60)
Glucose, Bld: 122 mg/dL — ABNORMAL HIGH (ref 70–99)
Potassium: 2.9 mmol/L — ABNORMAL LOW (ref 3.5–5.1)
Sodium: 138 mmol/L (ref 135–145)
Total Bilirubin: 0.4 mg/dL (ref 0.3–1.2)
Total Protein: 8.5 g/dL — ABNORMAL HIGH (ref 6.5–8.1)

## 2021-10-31 LAB — RESP PANEL BY RT-PCR (FLU A&B, COVID) ARPGX2
Influenza A by PCR: NEGATIVE
Influenza B by PCR: NEGATIVE
SARS Coronavirus 2 by RT PCR: NEGATIVE

## 2021-10-31 LAB — LIPASE, BLOOD: Lipase: 53 U/L — ABNORMAL HIGH (ref 11–51)

## 2021-10-31 LAB — ETHANOL: Alcohol, Ethyl (B): 203 mg/dL — ABNORMAL HIGH (ref <10)

## 2021-10-31 LAB — POC URINE PREG, ED: Preg Test, Ur: NEGATIVE

## 2021-10-31 LAB — MAGNESIUM: Magnesium: 2 mg/dL (ref 1.7–2.4)

## 2021-10-31 MED ORDER — PANTOPRAZOLE SODIUM 40 MG PO TBEC
40.0000 mg | DELAYED_RELEASE_TABLET | Freq: Every day | ORAL | Status: DC
  Administered 2021-11-02: 40 mg via ORAL
  Filled 2021-10-31 (×3): qty 1

## 2021-10-31 MED ORDER — ACETAMINOPHEN 325 MG PO TABS
650.0000 mg | ORAL_TABLET | Freq: Once | ORAL | Status: DC
  Filled 2021-10-31: qty 2

## 2021-10-31 MED ORDER — ONDANSETRON HCL 4 MG PO TABS
8.0000 mg | ORAL_TABLET | Freq: Three times a day (TID) | ORAL | Status: DC | PRN
  Administered 2021-10-31: 8 mg via ORAL
  Filled 2021-10-31: qty 2

## 2021-10-31 MED ORDER — POTASSIUM CHLORIDE CRYS ER 20 MEQ PO TBCR
40.0000 meq | EXTENDED_RELEASE_TABLET | Freq: Once | ORAL | Status: DC
  Filled 2021-10-31: qty 2

## 2021-10-31 MED ORDER — AMLODIPINE BESYLATE 5 MG PO TABS
5.0000 mg | ORAL_TABLET | Freq: Every day | ORAL | Status: DC
  Administered 2021-11-02 – 2021-11-03 (×2): 5 mg via ORAL
  Filled 2021-10-31 (×4): qty 1

## 2021-10-31 MED ORDER — SODIUM CHLORIDE 0.9 % IV BOLUS
1000.0000 mL | Freq: Once | INTRAVENOUS | Status: AC
  Administered 2021-10-31: 1000 mL via INTRAVENOUS

## 2021-10-31 MED ORDER — DROPERIDOL 2.5 MG/ML IJ SOLN
2.5000 mg | Freq: Once | INTRAMUSCULAR | Status: AC
  Administered 2021-10-31: 2.5 mg via INTRAVENOUS
  Filled 2021-10-31: qty 2

## 2021-10-31 MED ORDER — INSULIN GLARGINE-YFGN 100 UNIT/ML ~~LOC~~ SOLN
6.0000 [IU] | Freq: Every day | SUBCUTANEOUS | Status: DC
  Administered 2021-10-31 – 2021-11-02 (×2): 6 [IU] via SUBCUTANEOUS
  Filled 2021-10-31 (×4): qty 0.06

## 2021-10-31 MED ORDER — PANCRELIPASE (LIP-PROT-AMYL) 12000-38000 UNITS PO CPEP
72000.0000 [IU] | ORAL_CAPSULE | Freq: Three times a day (TID) | ORAL | Status: DC
  Administered 2021-11-02 – 2021-11-03 (×2): 72000 [IU] via ORAL
  Filled 2021-10-31: qty 2
  Filled 2021-10-31: qty 6
  Filled 2021-10-31 (×6): qty 2
  Filled 2021-10-31 (×2): qty 6

## 2021-10-31 MED ORDER — NICOTINE 21 MG/24HR TD PT24
21.0000 mg | MEDICATED_PATCH | Freq: Every day | TRANSDERMAL | Status: DC
  Filled 2021-10-31 (×4): qty 1

## 2021-10-31 MED ORDER — METOCLOPRAMIDE HCL 5 MG/ML IJ SOLN
10.0000 mg | Freq: Once | INTRAMUSCULAR | Status: AC
  Administered 2021-10-31: 10 mg via INTRAVENOUS
  Filled 2021-10-31: qty 2

## 2021-10-31 MED ORDER — ADULT MULTIVITAMIN W/MINERALS CH: 1.0000 | ORAL_TABLET | Freq: Once | ORAL | Status: DC

## 2021-10-31 MED ORDER — ONDANSETRON HCL 4 MG/2ML IJ SOLN: 4.0000 mg | Freq: Once | INTRAMUSCULAR | Status: DC

## 2021-10-31 MED ORDER — RIVAROXABAN 20 MG PO TABS
20.0000 mg | ORAL_TABLET | Freq: Every day | ORAL | Status: DC
  Filled 2021-10-31 (×5): qty 1

## 2021-10-31 MED ORDER — PANCRELIPASE (LIP-PROT-AMYL) 12000-38000 UNITS PO CPEP
36000.0000 [IU] | ORAL_CAPSULE | Freq: Two times a day (BID) | ORAL | Status: DC | PRN
  Administered 2021-11-02: 36000 [IU] via ORAL
  Filled 2021-10-31: qty 3

## 2021-10-31 NOTE — ED Notes (Signed)
IVC  consult  done  pending  placement

## 2021-10-31 NOTE — Consult Note (Signed)
[facility] Face-to-Face Psychiatry Consult   Reason for Consult:  suicide threat Referring Physician:  EDP Patient Identification: Amy Wolfe MRN:  [id number] Principal Diagnosis: Major depressive disorder, single episode, severe without psychotic features ([place]) Diagnosis:  Principal Problem:   Major depressive disorder, single episode, severe without psychotic features ([place]) Active Problems:   Alcohol abuse   Total Time spent with patient: 45 minutes  Subjective:   Amy Wolfe is a 46 y.o. female patient admitted with suicide threats.  HPI:  46 yo female presents with suicidal ideations, agitation, and substance abuse.  Tearful and upset during the assessment.  "I showed my ass because I was upset.  Everyone thinks I need to be here.  Nobody is there for me."  She is crying and upset.  She did give permission to talk to her mother who reports she was using drugs last night and posting on Instagram that she was  going to kill herself.   Today, police were called because she was demanding to talk to someone and banging on their door.  Her mother has been trying to get her to get help and loves her dearly.  She say Amy Wolfe thinks no one loves her "and that's not true."  Pancreatitis with past surgeries.  Psych admission needed.  Past Psychiatric History: depression, polysubstance use d/o  Risk to Self:  yes Risk to Others:  none Prior Inpatient Therapy:  none Prior Outpatient Therapy:  none  Past Medical History:  Past Medical History:  Diagnosis Date   Abdominal pain    Anxiety    Coronary artery disease    Diabetes mellitus without complication (HCC)    Dyspnea    GERD (gastroesophageal reflux disease)    H/O blood clots    Hypertension    Liver abscess    Pancreatitis 2019   PONV (postoperative nausea and vomiting) 11/07/2018   Thyroid disease    Uterine fibroid     Past Surgical History:  Procedure Laterality Date   BILIARY BRUSHING  08/22/2021   Procedure:  BILIARY BRUSHING;  Surgeon: Irving Copas., MD;  Location: [facility];  Service: Gastroenterology;;   BIOPSY  06/01/2021   Procedure: BIOPSY;  Surgeon: Irving Copas., MD;  Location: [facility];  Service: Gastroenterology;;   CESAREAN SECTION     EMBOLIZATION N/A 06/14/2018   Procedure: Uterine Embolization;  Surgeon: Algernon Huxley, MD;  Location: [facility];  Service: Cardiovascular;  Laterality: N/A;   ENDOSCOPIC RETROGRADE CHOLANGIOPANCREATOGRAPHY (ERCP) WITH PROPOFOL N/A 08/22/2021   Procedure: ENDOSCOPIC RETROGRADE CHOLANGIOPANCREATOGRAPHY (ERCP) WITH PROPOFOL;  Surgeon: Rush Landmark Telford Nab., MD;  Location: [facility];  Service: Gastroenterology;  Laterality: N/A;   ESOPHAGOGASTRODUODENOSCOPY (EGD) WITH PROPOFOL N/A 06/01/2021   Procedure: ESOPHAGOGASTRODUODENOSCOPY (EGD) WITH PROPOFOL;  Surgeon: Rush Landmark Telford Nab., MD;  Location: [facility];  Service: Gastroenterology;  Laterality: N/A;   ESOPHAGOGASTRODUODENOSCOPY (EGD) WITH PROPOFOL N/A 08/22/2021   Procedure: ESOPHAGOGASTRODUODENOSCOPY (EGD) WITH PROPOFOL;  Surgeon: Rush Landmark Telford Nab., MD;  Location: [facility];  Service: Gastroenterology;  Laterality: N/A;   EUS N/A 06/01/2021   Procedure: UPPER ENDOSCOPIC ULTRASOUND (EUS) RADIAL;  Surgeon: Irving Copas., MD;  Location: [facility];  Service: Gastroenterology;  Laterality: N/A;   EUS N/A 08/22/2021   Procedure: ESOPHAGEAL ENDOSCOPIC ULTRASOUND (EUS) RADIAL;  Surgeon: Rush Landmark Telford Nab., MD;  Location: [facility];  Service: Gastroenterology;  Laterality: N/A;   laceration finger Right    LAPAROSCOPIC VAGINAL HYSTERECTOMY WITH SALPINGECTOMY Bilateral 10/28/2018   Procedure: LAPAROSCOPIC ASSISTED VAGINAL HYSTERECTOMY  BILATERAL WITH SALPINGECTOMY;  Surgeon: Rubie Maid, MD;  Location: [facility];  Service: Gynecology;  Laterality: Bilateral;   NEUROLYTIC CELIAC PLEXUS  08/22/2021   Procedure: NEUROLYTIC CELIAC PLEXUS;  Surgeon:  Rush Landmark Telford Nab., MD;  Location: [facility];  Service: Gastroenterology;;   PANCREATIC STENT PLACEMENT  08/22/2021   Procedure: PANCREATIC STENT PLACEMENT;  Surgeon: Irving Copas., MD;  Location: [facility];  Service: Gastroenterology;;   REMOVAL OF STONES  08/22/2021   Procedure: REMOVAL OF STONES;  Surgeon: Irving Copas., MD;  Location: [facility];  Service: Gastroenterology;;   Amy Wolfe  08/22/2021   Procedure: Amy Wolfe;  Surgeon: Irving Copas., MD;  Location: [facility];  Service: Gastroenterology;;   THYROIDECTOMY, PARTIAL     VAGINAL HYSTERECTOMY Bilateral 10/28/2018   Procedure: HYSTERECTOMY VAGINAL WITH BILATERAL SALPINGECTOMY;  Surgeon: Rubie Maid, MD;  Location: [facility];  Service: Gynecology;  Laterality: Bilateral;   Family History:  Family History  Problem Relation Age of Onset   Hypertension Mother    Diabetes Mother    Brain cancer Father    Aneurysm Brother    Bone cancer Maternal Grandmother    Lung cancer Maternal Grandfather    Cancer Paternal Grandmother    Cancer Paternal Grandfather    Colon cancer Neg Hx    Esophageal cancer Neg Hx    Inflammatory bowel disease Neg Hx    Liver disease Neg Hx    Pancreatic cancer Neg Hx    Rectal cancer Neg Hx    Stomach cancer Neg Hx    Family Psychiatric  History: none Social History:  Social History   Substance and Sexual Activity  Alcohol Use Not Currently     Social History   Substance and Sexual Activity  Drug Use Yes   Types: Marijuana   Comment: daily    Social History   Socioeconomic History   Marital status: Single    Spouse name: Not on file   Number of children: 2   Years of education: 12   Highest education level: Associate degree: academic program  Occupational History   Occupation: [employer]  Tobacco Use   Smoking status: Every Day    Packs/day: 0.50    Years: 35.00    Total pack years: 17.50    Types: Cigarettes   Smokeless tobacco:  Never  Vaping Use   Vaping Use: Never used  Substance and Sexual Activity   Alcohol use: Not Currently   Drug use: Yes    Types: Marijuana    Comment: daily   Sexual activity: Yes    Birth control/protection: Surgical  Other Topics Concern   Not on file  Social History Narrative   Not on file   Social Determinants of Health   Financial Resource Strain: Low Risk  (10/04/2018)   Overall Financial Resource Strain (CARDIA)    Difficulty of Paying Living Expenses: Not hard at all  Food Insecurity: No Food Insecurity (10/04/2018)   Hunger Vital Sign    Worried About Running Out of Food in the Last Year: Never true    Ran Out of Food in the Last Year: Never true  Transportation Needs: No Transportation Needs (10/04/2018)   PRAPARE - Hydrologist (Medical): No    Lack of Transportation (Non-Medical): No  Physical Activity: Inactive (10/04/2018)   Exercise Vital Sign    Days of Exercise per Week: 0 days    Minutes of Exercise per Session: 0 min  Stress: No Stress Concern Present (  10/04/2018)   [place]    Feeling of Stress : Only a little  Social Connections: Unknown (10/04/2018)   Social Connection and Isolation Panel [NHANES]    Frequency of Communication with Friends and Family: More than three times a week    Frequency of Social Gatherings with Friends and Family: More than three times a week    Attends Religious Services: Never    Marine scientist or Organizations: No    Attends Archivist Meetings: Never    Marital Status: Not on file   Additional Social History:    Allergies:  No Known Allergies  Labs:  Results for orders placed or performed during the hospital encounter of 10/31/21 (from the past 48 hour(s))  Comprehensive metabolic panel     Status: Abnormal   Collection Time: 10/31/21  9:49 AM  Result Value Ref Range   Sodium 138 135 - 145 mmol/L    Potassium 2.9 (L) 3.5 - 5.1 mmol/L   Chloride 103 98 - 111 mmol/L   CO2 25 22 - 32 mmol/L   Glucose, Bld 122 (H) 70 - 99 mg/dL    Comment: Glucose reference range applies only to samples taken after fasting for at least 8 hours.   BUN 10 6 - 20 mg/dL   Creatinine, Ser 0.61 0.44 - 1.00 mg/dL   Calcium 9.2 8.9 - 10.3 mg/dL   Total Protein 8.5 (H) 6.5 - 8.1 g/dL   Albumin 4.3 3.5 - 5.0 g/dL   AST 35 15 - 41 U/L   ALT 26 0 - 44 U/L   Alkaline Phosphatase 104 38 - 126 U/L   Total Bilirubin 0.4 0.3 - 1.2 mg/dL   GFR, Estimated >60 >60 mL/min    Comment: (NOTE) Calculated using the CKD-EPI Creatinine Equation (2021)    Anion gap 10 5 - 15    Comment: Performed at [facility], [address] [place], [place] [address]  Ethanol     Status: Abnormal   Collection Time: 10/31/21  9:49 AM  Result Value Ref Range   Alcohol, Ethyl (B) 203 (H) <10 mg/dL    Comment: (NOTE) Lowest detectable limit for serum alcohol is 10 mg/dL.  For medical purposes only. Performed at [facility], [address] [place], [place] [address]   CBC with Diff     Status: Abnormal   Collection Time: 10/31/21  9:49 AM  Result Value Ref Range   WBC 5.4 4.0 - 10.5 K/uL   RBC 5.83 (H) 3.87 - 5.11 MIL/uL   Hemoglobin 15.8 (H) 12.0 - 15.0 g/dL   HCT 50.1 (H) 36.0 - 46.0 %   MCV 85.9 80.0 - 100.0 fL   MCH 27.1 26.0 - 34.0 pg   MCHC 31.5 30.0 - 36.0 g/dL   RDW 13.6 11.5 - 15.5 %   Platelets 408 (H) 150 - 400 K/uL   nRBC 0.0 0.0 - 0.2 %   Neutrophils Relative % 57 %   Neutro Abs 3.1 1.7 - 7.7 K/uL   Lymphocytes Relative 31 %   Lymphs Abs 1.7 0.7 - 4.0 K/uL   Monocytes Relative 9 %   Monocytes Absolute 0.5 0.1 - 1.0 K/uL   Eosinophils Relative 2 %   Eosinophils Absolute 0.1 0.0 - 0.5 K/uL   Basophils Relative 1 %   Basophils Absolute 0.0 0.0 - 0.1 K/uL   Immature Granulocytes 0 %   Abs Immature Granulocytes 0.02 0.00 -  0.07 K/uL    Comment: Performed at [facility], [address] [place], [place] [address]    Current Facility-Administered Medications  Medication Dose Route Frequency Provider Last Rate Last Admin   amLODipine (NORVASC) tablet 5 mg  5 mg Oral Daily Duffy Bruce, MD       insulin glargine-yfgn [facility]) injection 6 Units  6 Units Subcutaneous QHS Duffy Bruce, MD       lipase/protease/amylase (CREON) capsule 36,000 Units  36,000 Units Oral BID PRN Lorin Picket, [facility]       lipase/protease/amylase (CREON) capsule 72,000 Units  72,000 Units Oral TID WC Duffy Bruce, MD       multivitamin with minerals tablet 1 tablet  1 tablet Oral Once Duffy Bruce, MD       nicotine (NICODERM CQ - dosed in mg/24 hours) patch 21 mg  21 mg Transdermal Daily Duffy Bruce, MD       ondansetron [facility] tablet 8 mg  8 mg Oral Q8H PRN Duffy Bruce, MD       pantoprazole (PROTONIX) EC tablet 40 mg  40 mg Oral Daily Duffy Bruce, MD       potassium chloride SA (KLOR-CON M) CR tablet 40 mEq  40 mEq Oral Once Duffy Bruce, MD       rivaroxaban Alveda Reasons) tablet 20 mg  20 mg Oral Q supper Duffy Bruce, MD       Current Outpatient Medications  Medication Sig Dispense Refill   Accu-Chek Softclix Lancets lancets USE TO CHECK BLOOD SUGAR UP TO 4 TIMES DAILY AS DIRECTED 100 each 2   acetaminophen (TYLENOL) 500 MG tablet Take 2 tablets (1,000 mg total) by mouth every 6 (six) hours as needed for mild pain, fever or headache (or Fever >/= 101). DO Not Take if taking the Percocet's as well 30 tablet 0   amLODipine (NORVASC) 5 MG tablet Take 1 tablet (5 mg total) by mouth daily. 30 tablet 0   blood glucose meter kit and supplies KIT Dispense based on patient and insurance preference. Use up to four times daily as directed. 1 each 0   blood glucose meter kit and supplies Dispense based on patient and insurance preference. Use up to four times daily as directed. (FOR ICD-10 E10.9, E11.9). 1 each 0   glucose blood (ACCU-CHEK GUIDE) test strip USE TO  CHECK BLOOD SUGAR UP TO 4 TIMES DAILY AS DIRECTED 100 each 2   insulin detemir (LEVEMIR FLEXPEN) 100 UNIT/ML FlexPen Inject 6 Units into the skin daily. 15 mL 0   Insulin Pen Needle (PEN NEEDLES) 30G X 8 MM MISC 1 each by Does not apply route daily. 90 each 3   Insulin Pen Needle 31G X 5 MM MISC use with levemir flexpen daily 100 each 3   lipase/protease/amylase (CREON) 36000 UNITS CPEP capsule Take 1-2 capsules (36,000-72,000 Units total) by mouth See admin instructions. Take 2 capsules (72000u) by mouth three times daily with meals and take 1 capsule (36000u) by mouth twice daily with snacks 240 capsule 6   Multiple Vitamins-Minerals (MULTIVITAMIN ADULT) CHEW Chew 1 each by mouth daily.     nicotine (NICODERM CQ - DOSED IN MG/24 HOURS) 21 mg/24hr patch Place 1 patch (21 mg total) onto the skin daily. (Patient not taking: Reported on 09/02/2021) 28 patch 0   Nutritional Supplements (,FEEDING SUPPLEMENT, PROSOURCE PLUS) liquid Take 30 mLs by mouth 3 (three) times daily between meals. (Patient not taking: Reported on 09/15/2021) 3000 mL 0   ondansetron (  ZOFRAN) 8 MG tablet Take 1 tablet (8 mg total) by mouth every 8 (eight) hours as needed for nausea or vomiting. 30 tablet 6   pantoprazole (PROTONIX) 40 MG tablet Take 1 tablet (40 mg total) by mouth 2 (two) times daily before a meal. 60 tablet 6   polyethylene glycol (MIRALAX / GLYCOLAX) 17 g packet Take 17 g by mouth daily. 14 each 0   rivaroxaban (XARELTO) 20 MG TABS tablet Take 1 tablet (20 mg total) by mouth daily with supper. 90 tablet 1   silver sulfADIAZINE (SILVADENE) 1 % cream Apply topically 2 (two) times daily. 50 g 0    Musculoskeletal: Strength & Muscle Tone: decreased Gait & Station: normal Patient leans: N/A  Psychiatric Specialty Exam: Physical Exam Vitals and nursing note reviewed.  Constitutional:      Appearance: Normal appearance.  HENT:     Head: Normocephalic.     Nose: Nose normal.  Pulmonary:     Effort: Pulmonary  effort is normal.  Musculoskeletal:        General: Normal range of motion.     Cervical back: Normal range of motion.  Neurological:     General: No focal deficit present.     Mental Status: She is alert and oriented to person, place, and time.  Psychiatric:        Attention and Perception: Attention and perception normal.        Mood and Affect: Mood is anxious and depressed. Affect is tearful.        Speech: Speech normal.        Behavior: Behavior normal. Behavior is cooperative.        Thought Content: Thought content includes suicidal ideation. Thought content includes suicidal plan.        Cognition and Memory: Cognition and memory normal.        Judgment: Judgment is impulsive.     Review of Systems  Psychiatric/Behavioral:  Positive for depression, substance abuse and suicidal ideas. The patient is nervous/anxious.   All other systems reviewed and are negative.   Blood pressure (!) 139/102, pulse 100, temperature 98.5 F (36.9 C), temperature source Oral, resp. rate 20, height _0  (1.702 m), weight 58.1 kg, last menstrual period 09/30/2018, SpO2 98 %.Body mass index is 20.05 kg/m.  General Appearance: Disheveled  Eye Contact:  Fair  Speech:  Normal Rate  Volume:  Normal  Mood:  Anxious and Depressed  Affect:  Congruent  Thought Process:  Coherent  Orientation:  Full (Time, Place, and Person)  Thought Content:  Rumination  Suicidal Thoughts:  Yes.  with intent/plan  Homicidal Thoughts:  No  Memory:  Immediate;   Fair Recent;   Fair Remote;   Fair  Judgement:  Poor  Insight:  Lacking  Psychomotor Activity:  Decreased  Concentration:  Concentration: Fair and Attention Span: Fair  Recall:  [employer] of Knowledge:  Fair  Language:  Fair  Akathisia:  No  Handed:  Right  AIMS (if indicated):     Assets:  Housing Leisure Time Physical Health Resilience Social Support  ADL's:  Intact  Cognition:  WNL  Sleep:        Physical Exam: Physical Exam Vitals and  nursing note reviewed.  Constitutional:      Appearance: Normal appearance.  HENT:     Head: Normocephalic.     Nose: Nose normal.  Pulmonary:     Effort: Pulmonary effort is normal.  Musculoskeletal:  General: Normal range of motion.     Cervical back: Normal range of motion.  Neurological:     General: No focal deficit present.     Mental Status: She is alert and oriented to person, place, and time.  Psychiatric:        Attention and Perception: Attention and perception normal.        Mood and Affect: Mood is anxious and depressed. Affect is tearful.        Speech: Speech normal.        Behavior: Behavior normal. Behavior is cooperative.        Thought Content: Thought content includes suicidal ideation. Thought content includes suicidal plan.        Cognition and Memory: Cognition and memory normal.        Judgment: Judgment is impulsive.    Review of Systems  Psychiatric/Behavioral:  Positive for depression, substance abuse and suicidal ideas. The patient is nervous/anxious.   All other systems reviewed and are negative.  Blood pressure (!) 139/102, pulse 100, temperature 98.5 F (36.9 C), temperature source Oral, resp. rate 20, height _0  (1.702 m), weight 58.1 kg, last menstrual period 09/30/2018, SpO2 98 %. Body mass index is 20.05 kg/m.  Treatment Plan Summary: Daily contact with patient to assess and evaluate symptoms and progress in treatment, Medication management, and Plan : Major depressive disorder, recurrent, severe without psychosis Admit to inpatient [facility] for stabilization.  Disposition: Recommend psychiatric Inpatient admission when medically cleared.  Waylan Boga, NP 10/31/2021 3:35 PM

## 2021-10-31 NOTE — ED Notes (Signed)
Pt refusing ekg at this time.  Pt on side.  Nausea  improved.  Iv fluids infusing.  Pt continues to have abd pain.

## 2021-10-31 NOTE — ED Notes (Signed)
Pt agreed to covid swab.  Swab obtained with security in room

## 2021-10-31 NOTE — ED Provider Notes (Signed)
[facility] Provider Note    Event Date/Time   First MD Initiated Contact with Patient 10/31/21 1000     (approximate)   History   IVC   HPI  Amy Wolfe is a 46 y.o. female here under IVC for erratic behavior and reported suicidal ideation.  The patient reportedly was at her friend's house trying to get in.  He would not let her in.  She then began to say that she was going to kill himself if she cannot get him.  Police were called.  She reportedly became very agitated.  She has possibly been using drugs.  She uses cocaine, alcohol, marijuana, which she states she takes because of her chronic pancreatitis pain.  She also says she has been taking oxycodone that she gets off the street because no one else is willing to treat her.  She states she feels helpless.  She states that she goes to bed and does not want to wake up.  She states that she feels like she is done everything she can and does not know why she is still around.  She is tearful and intermittently agitated on exam.     Physical Exam   Triage Vital Signs: ED Triage Vitals  Enc Vitals Group     BP 10/31/21 0941 (!) 139/102     Pulse Rate 10/31/21 0941 100     Resp 10/31/21 0941 20     Temp 10/31/21 0941 98.5 F (36.9 C)     Temp Source 10/31/21 0941 Oral     SpO2 10/31/21 0941 98 %     Weight 10/31/21 0939 128 lb (58.1 kg)     Height 10/31/21 0939 '5\' 7"'$  (1.702 m)     Head Circumference --      Peak Flow --      Pain Score --      Pain Loc --      Pain Edu? --      Excl. in [place]? --     Most recent vital signs: Vitals:   10/31/21 0941  BP: (!) 139/102  Pulse: 100  Resp: 20  Temp: 98.5 F (36.9 C)  SpO2: 98%     General: Awake, no distress.  CV:  Good peripheral perfusion.  Resp:  Normal effort.  Abd:  No distention.  Other:  Tearful, labile.   ED Results / Procedures / Treatments   Labs (all labs ordered are listed, but only abnormal results are displayed) Labs  Reviewed  CBC WITH DIFFERENTIAL/PLATELET - Abnormal; Notable for the following components:      Result Value   RBC 5.83 (*)    Hemoglobin 15.8 (*)    HCT 50.1 (*)    Platelets 408 (*)    All other components within normal limits  COMPREHENSIVE METABOLIC PANEL  ETHANOL  URINE DRUG SCREEN, QUALITATIVE ([facility] ONLY)  POC URINE PREG, ED     EKG    RADIOLOGY    I also independently reviewed and agree with radiologist interpretations.   PROCEDURES:  Critical Care performed: No   MEDICATIONS ORDERED IN ED: Medications - No data to display   IMPRESSION / MDM / [place] / ED COURSE  I reviewed the triage vital signs and the nursing notes.                               The patient is on the cardiac  monitor to evaluate for evidence of arrhythmia and/or significant heart rate changes.   Ddx:  Differential includes the following, with pertinent life- or limb-threatening emergencies considered:  Substance-induced mood disorder, primary mood disorder  Patient's presentation is most consistent with acute presentation with potential threat to life or bodily function.  MDM:  46 year old female with history of polysubstance abuse here with increasing agitation, erratic behavior, and suicidal ideation.  She arrives under IVC.  The patient does appear tearful, labile, and expresses desire to not wake up.  I suspect this is multifactorial in the setting of possible underlying mood disorder as well as ongoing substance abuse and multiple social stressors.  Will consult TTS and psychiatry for evaluation.  Lab work reviewed and is overall reassuring.  No leukocytosis.  Patient is medically stable.   MEDICATIONS GIVEN IN ED: Medications - No data to display   Consults:    EMR reviewed       FINAL CLINICAL IMPRESSION(S) / ED DIAGNOSES   Final diagnoses:  Suicidal ideation  Polysubstance abuse ([place])     Rx / DC Orders   ED Discharge Orders     None         Note:  This document was prepared using Dragon voice recognition software and may include unintentional dictation errors.   Duffy Bruce, MD 10/31/21 1028
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
## 2021-10-31 NOTE — ED Notes (Signed)
Resumed care from [place] rn.  Pt tearful  states she wants to go.  Pt is IVC .  Pt waiting on tts consult.

## 2021-10-31 NOTE — ED Notes (Signed)
IVC PENDING  CONSULT ?

## 2021-10-31 NOTE — ED Notes (Signed)
Iv infiltrated.  Iv restarted.

## 2021-10-31 NOTE — ED Notes (Signed)
Pt vomiting and and has abd pain.  Md aware.

## 2021-10-31 NOTE — ED Notes (Signed)
Pt up to bathroom.

## 2021-10-31 NOTE — ED Notes (Signed)
Pt dressed into hospital attire by this tech, Nutritional therapist and [employer] office [employer]. Pt belongings placed in 1 of 1 bag that contain the following:  1 white bra 1 black with design leggings 1 pair of black running shoes 1 tank top  1 black shirt 1 black purse Bags given to primary RN and labeled with pt name

## 2021-10-31 NOTE — ED Notes (Signed)
Provided lunch.

## 2021-10-31 NOTE — BH Assessment (Signed)
Patient pending admission to [facility] BMU when patient is medically cleared. Patient is currently on a IV for pancreatitis.

## 2021-10-31 NOTE — ED Notes (Signed)
Poct pregnancy Negative 

## 2021-10-31 NOTE — ED Notes (Signed)
Pt continues to vomit.  Md aware  iv started and meds given for nausea.

## 2021-10-31 NOTE — ED Notes (Signed)
Pt continues to refuse covid swab.

## 2021-10-31 NOTE — ED Notes (Signed)
Pt refusing all PO and COVID swab. EDP notified.

## 2021-10-31 NOTE — ED Triage Notes (Addendum)
Pt bib officers under IVC, on handcuffs. Per IVC, pt had been trying to get into a home of a friend and he did not want her there and states pt "will kill herself if she didn't get to see him".   Pt denies S/I and H/I during triage. States "I self medicated, I bought Percocet myself because nobody is helping me.. I dont have any complaints, I want to go home.... I am about to become homeless because my landlord is supposed to sell the house". Pt denies overdosing, reports smoking Marijuana. Pressured speech, flights of ideas and talking loudly in triage.

## 2021-10-31 NOTE — ED Notes (Signed)
Noted bottle of "oxycodone/acetaminophen 5-'325mg'$ " with multiple different colored pills in it. RN and pharmacist Laurance Flatten identified tablets and labeled in different baggies, accompanied with pill log slip. Creon, protonix and zofran were identified. 0 count of oxy/acetaminophen. Secured in patient belonging bag.

## 2021-10-31 NOTE — ED Provider Notes (Signed)
Patient has a history of cyclical vomiting.  Patient is currently in the emergency department and IVCd.  Patient had a bed however had recurrent episodes of vomiting.  IV placed.  EKG obtained with normal QTc.  No significant ST elevation or depression.  No signs of acute ischemia or dysrhythmia.  Given IV fluid 2 L bolus, IV droperidol and IV Reglan for antiemetics  Lab work came back reassuring with no significant creatinine elevation.  Mildly low potassium but has significantly improved when compared to prior.  Most likely with cyclical vomiting syndrome.  Lipase is at her baseline.  Once tolerating p.o. patient can be medically cleared to be IVC.   Nathaniel Man, MD 10/31/21 [phone]

## 2021-10-31 NOTE — ED Notes (Signed)
Pt refusing meds.  No swab obtained.  No urine sample.   Pt ate a small amount of dinner tray.

## 2021-11-01 DIAGNOSIS — Z794 Long term (current) use of insulin: Secondary | ICD-10-CM | POA: Diagnosis not present

## 2021-11-01 DIAGNOSIS — R112 Nausea with vomiting, unspecified: Secondary | ICD-10-CM

## 2021-11-01 DIAGNOSIS — I81 Portal vein thrombosis: Secondary | ICD-10-CM

## 2021-11-01 DIAGNOSIS — Z808 Family history of malignant neoplasm of other organs or systems: Secondary | ICD-10-CM | POA: Diagnosis not present

## 2021-11-01 DIAGNOSIS — E119 Type 2 diabetes mellitus without complications: Secondary | ICD-10-CM | POA: Diagnosis present

## 2021-11-01 DIAGNOSIS — Z20822 Contact with and (suspected) exposure to covid-19: Secondary | ICD-10-CM | POA: Diagnosis present

## 2021-11-01 DIAGNOSIS — F32A Depression, unspecified: Secondary | ICD-10-CM | POA: Diagnosis not present

## 2021-11-01 DIAGNOSIS — F17210 Nicotine dependence, cigarettes, uncomplicated: Secondary | ICD-10-CM | POA: Diagnosis present

## 2021-11-01 DIAGNOSIS — R45851 Suicidal ideations: Secondary | ICD-10-CM

## 2021-11-01 DIAGNOSIS — Z833 Family history of diabetes mellitus: Secondary | ICD-10-CM | POA: Diagnosis not present

## 2021-11-01 DIAGNOSIS — F322 Major depressive disorder, single episode, severe without psychotic features: Secondary | ICD-10-CM | POA: Diagnosis not present

## 2021-11-01 DIAGNOSIS — F419 Anxiety disorder, unspecified: Secondary | ICD-10-CM | POA: Diagnosis present

## 2021-11-01 DIAGNOSIS — Z91199 Patient's noncompliance with other medical treatment and regimen due to unspecified reason: Secondary | ICD-10-CM | POA: Diagnosis not present

## 2021-11-01 DIAGNOSIS — Z7901 Long term (current) use of anticoagulants: Secondary | ICD-10-CM | POA: Diagnosis not present

## 2021-11-01 DIAGNOSIS — K8689 Other specified diseases of pancreas: Secondary | ICD-10-CM

## 2021-11-01 DIAGNOSIS — E876 Hypokalemia: Secondary | ICD-10-CM | POA: Diagnosis present

## 2021-11-01 DIAGNOSIS — F1410 Cocaine abuse, uncomplicated: Secondary | ICD-10-CM

## 2021-11-01 DIAGNOSIS — Z801 Family history of malignant neoplasm of trachea, bronchus and lung: Secondary | ICD-10-CM | POA: Diagnosis not present

## 2021-11-01 DIAGNOSIS — Z90710 Acquired absence of both cervix and uterus: Secondary | ICD-10-CM | POA: Diagnosis not present

## 2021-11-01 DIAGNOSIS — K861 Other chronic pancreatitis: Secondary | ICD-10-CM | POA: Diagnosis present

## 2021-11-01 DIAGNOSIS — I10 Essential (primary) hypertension: Secondary | ICD-10-CM | POA: Diagnosis present

## 2021-11-01 DIAGNOSIS — Z86718 Personal history of other venous thrombosis and embolism: Secondary | ICD-10-CM | POA: Diagnosis not present

## 2021-11-01 DIAGNOSIS — F1010 Alcohol abuse, uncomplicated: Secondary | ICD-10-CM | POA: Diagnosis present

## 2021-11-01 DIAGNOSIS — F332 Major depressive disorder, recurrent severe without psychotic features: Secondary | ICD-10-CM | POA: Diagnosis not present

## 2021-11-01 DIAGNOSIS — Z046 Encounter for general psychiatric examination, requested by authority: Secondary | ICD-10-CM

## 2021-11-01 DIAGNOSIS — I2510 Atherosclerotic heart disease of native coronary artery without angina pectoris: Secondary | ICD-10-CM | POA: Diagnosis present

## 2021-11-01 DIAGNOSIS — F1090 Alcohol use, unspecified, uncomplicated: Secondary | ICD-10-CM

## 2021-11-01 DIAGNOSIS — Z79891 Long term (current) use of opiate analgesic: Secondary | ICD-10-CM | POA: Diagnosis not present

## 2021-11-01 DIAGNOSIS — Z8249 Family history of ischemic heart disease and other diseases of the circulatory system: Secondary | ICD-10-CM | POA: Diagnosis not present

## 2021-11-01 DIAGNOSIS — K860 Alcohol-induced chronic pancreatitis: Secondary | ICD-10-CM

## 2021-11-01 DIAGNOSIS — R1115 Cyclical vomiting syndrome unrelated to migraine: Secondary | ICD-10-CM | POA: Diagnosis not present

## 2021-11-01 DIAGNOSIS — Y907 Blood alcohol level of 200-239 mg/100 ml: Secondary | ICD-10-CM | POA: Diagnosis present

## 2021-11-01 LAB — BASIC METABOLIC PANEL
Anion gap: 9 (ref 5–15)
BUN: 8 mg/dL (ref 6–20)
CO2: 26 mmol/L (ref 22–32)
Calcium: 8.7 mg/dL — ABNORMAL LOW (ref 8.9–10.3)
Chloride: 107 mmol/L (ref 98–111)
Creatinine, Ser: 0.59 mg/dL (ref 0.44–1.00)
GFR, Estimated: >60 mL/min (ref >60)
Glucose, Bld: 190 mg/dL — ABNORMAL HIGH (ref 70–99)
Potassium: 3.4 mmol/L — ABNORMAL LOW (ref 3.5–5.1)
Sodium: 142 mmol/L (ref 135–145)

## 2021-11-01 LAB — CBC
HCT: 49.6 % — ABNORMAL HIGH (ref 36.0–46.0)
Hemoglobin: 15.7 g/dL — ABNORMAL HIGH (ref 12.0–15.0)
MCH: 26.5 pg (ref 26.0–34.0)
MCHC: 31.7 g/dL (ref 30.0–36.0)
MCV: 83.8 fL (ref 80.0–100.0)
Platelets: 256 10*3/uL (ref 150–400)
RBC: 5.92 MIL/uL — ABNORMAL HIGH (ref 3.87–5.11)
RDW: 13.7 % (ref 11.5–15.5)
WBC: 5.8 10*3/uL (ref 4.0–10.5)
nRBC: 0 % (ref 0.0–0.2)

## 2021-11-01 LAB — GLUCOSE, CAPILLARY
Glucose-Capillary: 152 mg/dL — ABNORMAL HIGH (ref 70–99)
Glucose-Capillary: 84 mg/dL (ref 70–99)
Glucose-Capillary: 132 mg/dL — ABNORMAL HIGH (ref 70–99)

## 2021-11-01 MED ORDER — MORPHINE SULFATE (PF) 2 MG/ML IV SOLN
2.0000 mg | INTRAVENOUS | Status: DC | PRN
  Administered 2021-11-01 (×2): 2 mg via INTRAVENOUS
  Filled 2021-11-01 (×2): qty 1

## 2021-11-01 MED ORDER — OXYCODONE-ACETAMINOPHEN 5-325 MG PO TABS
1.0000 | ORAL_TABLET | Freq: Four times a day (QID) | ORAL | Status: DC | PRN
  Administered 2021-11-02 – 2021-11-03 (×3): 1 via ORAL
  Filled 2021-11-01 (×6): qty 1

## 2021-11-01 MED ORDER — HYDRALAZINE HCL 20 MG/ML IJ SOLN
10.0000 mg | INTRAMUSCULAR | Status: DC | PRN
  Administered 2021-11-01 – 2021-11-02 (×2): 10 mg via INTRAVENOUS
  Filled 2021-11-01 (×2): qty 1

## 2021-11-01 MED ORDER — SODIUM CHLORIDE 0.9% FLUSH
3.0000 mL | Freq: Two times a day (BID) | INTRAVENOUS | Status: DC
  Administered 2021-11-01 – 2021-11-03 (×5): 3 mL via INTRAVENOUS

## 2021-11-01 MED ORDER — INSULIN ASPART 100 UNIT/ML IJ SOLN
0.0000 [IU] | Freq: Every day | INTRAMUSCULAR | Status: DC
  Administered 2021-11-02: 3 [IU] via SUBCUTANEOUS
  Filled 2021-11-01: qty 1

## 2021-11-01 MED ORDER — ONDANSETRON HCL 4 MG PO TABS: 4.0000 mg | ORAL_TABLET | Freq: Four times a day (QID) | ORAL | Status: DC | PRN

## 2021-11-01 MED ORDER — MORPHINE SULFATE (PF) 2 MG/ML IV SOLN
2.0000 mg | INTRAVENOUS | Status: DC | PRN
  Administered 2021-11-02 (×2): 2 mg via INTRAVENOUS
  Filled 2021-11-01 (×3): qty 1

## 2021-11-01 MED ORDER — ACETAMINOPHEN 325 MG PO TABS
650.0000 mg | ORAL_TABLET | Freq: Four times a day (QID) | ORAL | Status: DC | PRN
  Filled 2021-11-01: qty 2

## 2021-11-01 MED ORDER — INSULIN ASPART 100 UNIT/ML IJ SOLN
0.0000 [IU] | Freq: Three times a day (TID) | INTRAMUSCULAR | Status: DC
  Administered 2021-11-01: 2 [IU] via SUBCUTANEOUS
  Administered 2021-11-02 – 2021-11-03 (×2): 3 [IU] via SUBCUTANEOUS
  Filled 2021-11-01 (×4): qty 1

## 2021-11-01 MED ORDER — KCL IN DEXTROSE-NACL 40-5-0.9 MEQ/L-%-% IV SOLN
INTRAVENOUS | Status: DC
  Filled 2021-11-01 (×3): qty 1000

## 2021-11-01 MED ORDER — ACETAMINOPHEN 650 MG RE SUPP: 650.0000 mg | Freq: Four times a day (QID) | RECTAL | Status: DC | PRN

## 2021-11-01 MED ORDER — POTASSIUM CHLORIDE IN NACL 20-0.9 MEQ/L-% IV SOLN
INTRAVENOUS | Status: DC
  Filled 2021-11-01 (×7): qty 1000

## 2021-11-01 MED ORDER — SODIUM CHLORIDE 0.9% FLUSH
3.0000 mL | INTRAVENOUS | Status: DC | PRN
  Administered 2021-11-01: 3 mL via INTRAVENOUS

## 2021-11-01 MED ORDER — ONDANSETRON HCL 4 MG/2ML IJ SOLN
4.0000 mg | Freq: Four times a day (QID) | INTRAMUSCULAR | Status: DC | PRN
  Administered 2021-11-01 – 2021-11-03 (×4): 4 mg via INTRAVENOUS
  Filled 2021-11-01 (×4): qty 2

## 2021-11-01 MED ORDER — SODIUM CHLORIDE 0.9 % IV SOLN
25.0000 mg | Freq: Once | INTRAVENOUS | Status: AC
  Administered 2021-11-01: 25 mg via INTRAVENOUS
  Filled 2021-11-01: qty 1

## 2021-11-01 MED ORDER — HYDRALAZINE HCL 20 MG/ML IJ SOLN
5.0000 mg | INTRAMUSCULAR | Status: DC | PRN
  Administered 2021-11-01 (×2): 5 mg via INTRAVENOUS
  Filled 2021-11-01 (×2): qty 1

## 2021-11-01 NOTE — Progress Notes (Signed)
Pt has refused all po meds this shift.  She is also refusing ivf.  Pt says she wants to go home. Pt educated on the benefits of taking her meds and the risks of not taking them.  She says all she wants is ice water.  Pt also refused all meals. She says that when she eats it causes her a lot of pain n/v.  Meds offered for pain and n/v but pt still refused and started yelling "ALL I WANT IS ICE WATER".  MD made aware.

## 2021-11-01 NOTE — ED Provider Notes (Signed)
12:30 AM  Assumed care at shift change.  Patient here with SI under IVC but also having cyclical vomiting.  H/o same.  Still vomiting after Zofran, Reglan, Droperidol.  Will admit to medicine.  Will give phenergan.  Psych has seen and recommends inpatient.   Consulted and discussed patient's case with hospitalist, Dr. Damita Dunnings.  I have recommended admission and consulting physician agrees and will place admission orders.  Patient (and family if present) agree with this plan.   I reviewed all nursing notes, vitals, pertinent previous records.  All labs, EKGs, imaging ordered have been independently reviewed and interpreted by myself.    Amy Wolfe, Delice Bison, DO 11/01/21 [phone]

## 2021-11-01 NOTE — H&P (Signed)
History and Physical    Patient: Amy Wolfe OQH:[id number] DOB: Oct 31, 1975 DOA: 10/31/2021 DOS: the patient was seen and examined on 11/01/2021 PCP: Teodora Medici, DO  Patient coming from: Home  Chief Complaint:  Chief Complaint  Patient presents with   IVC    HPI: Amy Wolfe is a 46 y.o. 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  The triage note, she threatened to kill herself if she did not get to see him.  After arrival to the emergency room she voiced suicidal ideation and was seen by behavioral health with plans to admit to behavioral health for major depressive disorder.  While in the ED she developed intractable vomiting which was not resolved after several antiemetics. ED course and data review: Initial BP was 139/102 with otherwise normal vitals.  EtOH level was 203 and UDS positive for cocaine and cannabinoids.  Initial potassium was 2.9 which corrected to 3.3 with repletion.  LFTs were within normal limits and lipase 53.  Creatinine normal at 0.62.  WBC normal and hemoglobin 15.8. Antiemetics administered in the ED included droperidol, Zofran, Reglan and she also received fluid boluses. Medical admission was requested for intractable vomiting.     Past Medical History:  Diagnosis Date   Abdominal pain    Anxiety    Coronary artery disease    Diabetes mellitus without complication (HCC)    Dyspnea    GERD (gastroesophageal reflux disease)    H/O blood clots    Hypertension    Liver abscess    Pancreatitis 2019   PONV (postoperative nausea and vomiting) 11/07/2018   Thyroid disease     Uterine fibroid    Past Surgical History:  Procedure Laterality Date   BILIARY BRUSHING  08/22/2021   Procedure: BILIARY BRUSHING;  Surgeon: Irving Copas., MD;  Location: [facility];  Service: Gastroenterology;;   BIOPSY  06/01/2021   Procedure: BIOPSY;  Surgeon: Irving Copas., MD;  Location: [facility];  Service: Gastroenterology;;   CESAREAN SECTION     EMBOLIZATION N/A 06/14/2018   Procedure: Uterine Embolization;  Surgeon: Algernon Huxley, MD;  Location: [facility];  Service: Cardiovascular;  Laterality: N/A;   ENDOSCOPIC RETROGRADE CHOLANGIOPANCREATOGRAPHY (ERCP) WITH PROPOFOL N/A 08/22/2021   Procedure: ENDOSCOPIC RETROGRADE CHOLANGIOPANCREATOGRAPHY (ERCP) WITH PROPOFOL;  Surgeon: Rush Landmark Telford Nab., MD;  Location: [facility];  Service: Gastroenterology;  Laterality: N/A;   ESOPHAGOGASTRODUODENOSCOPY (EGD) WITH PROPOFOL N/A 06/01/2021   Procedure: ESOPHAGOGASTRODUODENOSCOPY (EGD) WITH PROPOFOL;  Surgeon: Rush Landmark Telford Nab., MD;  Location: [facility];  Service: Gastroenterology;  Laterality: N/A;   ESOPHAGOGASTRODUODENOSCOPY (EGD) WITH PROPOFOL N/A 08/22/2021   Procedure: ESOPHAGOGASTRODUODENOSCOPY (EGD) WITH PROPOFOL;  Surgeon: Rush Landmark Telford Nab., MD;  Location: [facility];  Service: Gastroenterology;  Laterality: N/A;   EUS N/A 06/01/2021   Procedure: UPPER ENDOSCOPIC ULTRASOUND (EUS) RADIAL;  Surgeon: Irving Copas., MD;  Location: [facility];  Service: Gastroenterology;  Laterality: N/A;   EUS N/A 08/22/2021   Procedure: ESOPHAGEAL ENDOSCOPIC ULTRASOUND (EUS) RADIAL;  Surgeon: Rush Landmark Telford Nab., MD;  Location: [facility];  Service: Gastroenterology;  Laterality: N/A;   laceration finger Right    LAPAROSCOPIC VAGINAL HYSTERECTOMY WITH SALPINGECTOMY Bilateral  10/28/2018   Procedure: LAPAROSCOPIC ASSISTED VAGINAL HYSTERECTOMY BILATERAL WITH SALPINGECTOMY;  Surgeon: Rubie Maid, MD;  Location: [facility];  Service:  Gynecology;  Laterality: Bilateral;   NEUROLYTIC CELIAC PLEXUS  08/22/2021   Procedure: NEUROLYTIC CELIAC PLEXUS;  Surgeon: Rush Landmark Telford Nab., MD;  Location: [facility];  Service: Gastroenterology;;   PANCREATIC STENT PLACEMENT  08/22/2021   Procedure: PANCREATIC STENT PLACEMENT;  Surgeon: Irving Copas., MD;  Location: [facility];  Service: Gastroenterology;;   REMOVAL OF STONES  08/22/2021   Procedure: REMOVAL OF STONES;  Surgeon: Irving Copas., MD;  Location: [facility];  Service: Gastroenterology;;   [name]  08/22/2021   Procedure: [name];  Surgeon: Irving Copas., MD;  Location: [facility];  Service: Gastroenterology;;   THYROIDECTOMY, PARTIAL     VAGINAL HYSTERECTOMY Bilateral 10/28/2018   Procedure: HYSTERECTOMY VAGINAL WITH BILATERAL SALPINGECTOMY;  Surgeon: Rubie Maid, MD;  Location: [facility];  Service: Gynecology;  Laterality: Bilateral;   Social History:  reports that she has been smoking cigarettes. She has a 17.50 pack-year smoking history. She has never used smokeless tobacco. She reports that she does not currently use alcohol. She reports current drug use. Drug: Marijuana.  No Known Allergies  Family History  Problem Relation Age of Onset   Hypertension Mother    Diabetes Mother    Brain cancer Father    Aneurysm Brother    Bone cancer Maternal Grandmother    Lung cancer Maternal Grandfather    Cancer Paternal Grandmother    Cancer Paternal Grandfather    Colon cancer Neg Hx    Esophageal cancer Neg Hx    Inflammatory bowel disease Neg Hx    Liver disease Neg Hx    Pancreatic cancer Neg Hx    Rectal cancer Neg Hx    Stomach cancer Neg Hx     Prior to Admission medications   Medication Sig Start Date End Date Taking? Authorizing Provider  Accu-Chek Softclix Lancets lancets USE TO CHECK BLOOD SUGAR UP TO 4 TIMES DAILY AS DIRECTED 08/05/21   Teodora Medici, DO  acetaminophen (TYLENOL) 500 MG tablet Take 2  tablets (1,000 mg total) by mouth every 6 (six) hours as needed for mild pain, fever or headache (or Fever >/= 101). DO Not Take if taking the Percocet's as well 07/30/21   Raiford Noble Latif, DO  amLODipine (NORVASC) 5 MG tablet Take 1 tablet (5 mg total) by mouth daily. 10/18/21 11/25/21  Nolberto Hanlon, MD  blood glucose meter kit and supplies KIT Dispense based on patient and insurance preference. Use up to four times daily as directed. 03/24/21   Teodora Medici, DO  blood glucose meter kit and supplies Dispense based on patient and insurance preference. Use up to four times daily as directed. (FOR ICD-10 E10.9, E11.9). 03/28/21   Teodora Medici, DO  glucose blood (ACCU-CHEK GUIDE) test strip USE TO CHECK BLOOD SUGAR UP TO 4 TIMES DAILY AS DIRECTED 08/05/21   Teodora Medici, DO  insulin detemir (LEVEMIR FLEXPEN) 100 UNIT/ML FlexPen Inject 6 Units into the skin daily. 09/02/21   Teodora Medici, DO  Insulin Pen Needle (PEN NEEDLES) 30G X 8 MM MISC 1 each by Does not apply route daily. 09/02/21   Teodora Medici, DO  Insulin Pen Needle 31G X 5 MM MISC use with levemir flexpen daily 09/02/21   Teodora Medici, DO  lipase/protease/amylase (CREON) 36000 UNITS CPEP capsule Take 1-2 capsules (36,000-72,000 Units total) by mouth See admin instructions. Take 2 capsules (72000u) by mouth three times  daily with meals and take 1 capsule (36000u) by mouth twice daily with snacks 08/22/21   Mansouraty, Telford Nab., MD  Multiple Vitamins-Minerals (MULTIVITAMIN ADULT) CHEW Chew 1 each by mouth daily.    [provider]  nicotine (NICODERM CQ - DOSED IN MG/24 HOURS) 21 mg/24hr patch Place 1 patch (21 mg total) onto the skin daily. Patient not taking: Reported on 09/02/2021 07/31/21   Raiford Noble Latif, DO  Nutritional Supplements (,FEEDING SUPPLEMENT, PROSOURCE PLUS) liquid Take 30 mLs by mouth 3 (three) times daily between meals. Patient not taking: Reported on 09/15/2021 07/30/21   Raiford Noble  Latif, DO  ondansetron (ZOFRAN) 8 MG tablet Take 1 tablet (8 mg total) by mouth every 8 (eight) hours as needed for nausea or vomiting. 09/16/21   Mansouraty, Telford Nab., MD  pantoprazole (PROTONIX) 40 MG tablet Take 1 tablet (40 mg total) by mouth 2 (two) times daily before a meal. 08/22/21   Mansouraty, Telford Nab., MD  polyethylene glycol (MIRALAX / GLYCOLAX) 17 g packet Take 17 g by mouth daily. 07/31/21   Raiford Noble Latif, DO  rivaroxaban (XARELTO) 20 MG TABS tablet Take 1 tablet (20 mg total) by mouth daily with supper. 09/02/21   Teodora Medici, DO  silver sulfADIAZINE (SILVADENE) 1 % cream Apply topically 2 (two) times daily. 10/17/21   Nolberto Hanlon, MD    Physical Exam: Vitals:   10/31/21 0939 10/31/21 0941 10/31/21 1843 10/31/21 2200  BP:  (!) 139/102 (!) 152/104 (!) 159/104  Pulse:  100 98 99  Resp:  20 (!) 22   Temp:  98.5 F (36.9 C) (!) 97.5 F (36.4 C) (!) 97 F (36.1 C)  TempSrc:  Oral Oral Oral  SpO2:  98% 98% 100%  Weight: 58.1 kg     Height: _0  (1.702 m)      Physical Exam Vitals and nursing note reviewed.  Constitutional:      General: She is not in acute distress.    Appearance: She is ill-appearing.  HENT:     Head: Normocephalic and atraumatic.  Cardiovascular:     Rate and Rhythm: Normal rate and regular rhythm.     Heart sounds: Normal heart sounds.  Pulmonary:     Effort: Pulmonary effort is normal.     Breath sounds: Normal breath sounds.  Abdominal:     Palpations: Abdomen is soft.     Tenderness: There is abdominal tenderness.  Neurological:     Mental Status: Mental status is at baseline.     Labs on Admission: I have personally reviewed following labs and imaging studies  CBC: Recent Labs  Lab 10/31/21 0949  WBC 5.4  NEUTROABS 3.1  HGB 15.8*  HCT 50.1*  MCV 85.9  PLT 532*   Basic Metabolic Panel: Recent Labs  Lab 10/31/21 0949 10/31/21 1920  NA 138 141  K 2.9* 3.3*  CL 103 103  CO2 25 27  GLUCOSE 122* 168*  BUN 10  10  CREATININE 0.61 0.62  CALCIUM 9.2 9.5  MG  --  2.0   GFR: Estimated Creatinine Clearance: 83.2 mL/min (by C-G formula based on SCr of 0.62 mg/dL). Liver Function Tests: Recent Labs  Lab 10/31/21 0949  AST 35  ALT 26  ALKPHOS 104  BILITOT 0.4  PROT 8.5*  ALBUMIN 4.3   Recent Labs  Lab 10/31/21 1920  LIPASE 53*   No results for input(s): "AMMONIA" in the last 168 hours. Coagulation Profile: No results for input(s): "INR", "PROTIME" in the  last 168 hours. Cardiac Enzymes: No results for input(s): "CKTOTAL", "CKMB", "CKMBINDEX", "TROPONINI" in the last 168 hours. BNP (last 3 results) No results for input(s): "PROBNP" in the last 8760 hours. HbA1C: No results for input(s): "HGBA1C" in the last 72 hours. CBG: No results for input(s): "GLUCAP" in the last 168 hours. Lipid Profile: No results for input(s): "CHOL", "HDL", "LDLCALC", "TRIG", "CHOLHDL", "LDLDIRECT" in the last 72 hours. Thyroid Function Tests: No results for input(s): "TSH", "T4TOTAL", "FREET4", "T3FREE", "THYROIDAB" in the last 72 hours. Anemia Panel: No results for input(s): "VITAMINB12", "FOLATE", "FERRITIN", "TIBC", "IRON", "RETICCTPCT" in the last 72 hours. Urine analysis:    Component Value Date/Time   COLORURINE YELLOW (A) 10/16/2021 0453   APPEARANCEUR CLOUDY (A) 10/16/2021 0453   LABSPEC 1.040 (H) 10/16/2021 0453   PHURINE 5.0 10/16/2021 0453   GLUCOSEU NEGATIVE 10/16/2021 0453   HGBUR SMALL (A) 10/16/2021 0453   BILIRUBINUR NEGATIVE 10/16/2021 0453   KETONESUR 20 (A) 10/16/2021 0453   PROTEINUR 30 (A) 10/16/2021 0453   NITRITE NEGATIVE 10/16/2021 0453   LEUKOCYTESUR SMALL (A) 10/16/2021 0453    Radiological Exams on Admission: No results found.   Data Reviewed: Relevant notes from primary care and specialist visits, past discharge summaries as available in EHR, including Care Everywhere. Prior diagnostic testing as pertinent to current admission diagnoses Updated medications and  problem lists for reconciliation ED course, including vitals, labs, imaging, treatment and response to treatment Triage notes, nursing and pharmacy notes and ED provider's notes Notable results as noted in HPI   Assessment and Plan: * Intractable vomiting with nausea Chronic pancreatitis History of pancreatic duct stones and pancreatic cyst Continue IV antiemetics, IV hydration Continue Pancreaze  Major depressive disorder, single episode, severe without psychotic features ([place]) Suicidal ideation One-to-one suicide precautions Continue involuntary commitment Seen by behavioral health in the ED Continue psychiatric meds Behavioral health consulted to follow  Hypokalemia Repleted in the ED Continue to monitor and replete as needed  Portal vein thrombosis Continue Xarelto  Cocaine use disorder ([place]) For counseling on abstinence when improved  Alcohol use disorder Elevated EtOH level of 203 For counseling when improved  Diabetes ([place]) Sliding scale insulin coverage  Essential hypertension Continue amlodipine IV hydralazine if unable to tolerate orally     DVT prophylaxis: Xarelto  Consults: Behavioral health  Advance Care Planning:   Code Status: Full Code   Family Communication: none  Disposition Plan: Back to previous home environment  Severity of Illness: The appropriate patient status for this patient is INPATIENT. Inpatient status is judged to be reasonable and necessary in order to provide the required intensity of service to ensure the patient's safety. The patient's presenting symptoms, physical exam findings, and initial radiographic and laboratory data in the context of their chronic comorbidities is felt to place them at high risk for further clinical deterioration. Furthermore, it is not anticipated that the patient will be medically stable for discharge from the hospital within 2 midnights of admission.   * I certify that at the point of admission it  is my clinical judgment that the patient will require inpatient hospital care spanning beyond 2 midnights from the point of admission due to high intensity of service, high risk for further deterioration and high frequency of surveillance required.*  Author: Athena Masse, MD 11/01/2021 12:33 AM  For on call review [email or web address].

## 2021-11-01 NOTE — Assessment & Plan Note (Signed)
For counseling on abstinence when improved

## 2021-11-01 NOTE — Assessment & Plan Note (Signed)
Elevated EtOH level of 203 For counseling when improved

## 2021-11-01 NOTE — Assessment & Plan Note (Addendum)
Chronic pancreatitis History of pancreatic duct stones and pancreatic cyst Continue IV antiemetics, IV hydration and pancreatic enzymes.  Nursing staff states patient not using the oral pain medication and asking for the IV.  Did not eat any liquid diet this morning.

## 2021-11-01 NOTE — Assessment & Plan Note (Signed)
Sliding scale insulin coverage 

## 2021-11-01 NOTE — Assessment & Plan Note (Addendum)
Continue potassium replacement in IV fluid.

## 2021-11-01 NOTE — Hospital Course (Signed)
47 year old female with past medical history of alcohol associated chronic pancreatitis.  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  History of portal vein thrombosis on Xarelto, type 2 diabetes mellitus hypertension chronic pain.  Patient was involuntary committed in the emergency room.  Patient was admitted medically for chronic pancreatitis and nausea or vomiting.

## 2021-11-01 NOTE — Assessment & Plan Note (Addendum)
-   Continue amlodipine ?

## 2021-11-01 NOTE — Assessment & Plan Note (Addendum)
Involuntary committed in the emergency room.  Plan to discharge to psychiatry floor when medically cleared.

## 2021-11-01 NOTE — ED Notes (Signed)
Pt moved to room 24.  Report off to shawn rn.

## 2021-11-01 NOTE — Assessment & Plan Note (Signed)
Continue Xarelto 

## 2021-11-01 NOTE — Progress Notes (Signed)
Progress Note   Patient: Amy Wolfe MPN:[id number] DOB: [birth year] DOA: 10/31/2021     0 DOS: the patient was seen and examined on 11/01/2021   Brief hospital course: 46 year old female with past medical history of alcohol associated chronic pancreatitis.  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  History of portal vein thrombosis on Xarelto, type 2 diabetes mellitus hypertension chronic pain.  Patient was involuntary committed in the emergency room.  Patient was admitted medically for chronic pancreatitis and nausea or vomiting.  Assessment and Plan: * Intractable vomiting with nausea Chronic pancreatitis History of pancreatic duct stones and pancreatic cyst Continue IV antiemetics, IV hydration and pancreatic enzymes.  Nursing staff states patient not using the oral pain medication and asking for the IV.  Did not eat any liquid diet this morning.  Major depressive disorder, single episode, severe without psychotic features ([place]) Involuntary committed in the emergency room.  Plan to discharge to psychiatry floor when medically cleared.  Hypokalemia Continue potassium replacement in IV fluid.  Portal vein thrombosis Continue Xarelto  Cocaine use disorder ([place]) For counseling on abstinence when improved  Alcohol use disorder Elevated EtOH level of 203 For counseling when improved  Diabetes ([place]) Sliding scale insulin coverage  Essential hypertension Continue amlodipine         Subjective: Sitter present during conversation and exam.  Patient still having abdominal pain.  States he takes chronic Percocet as outpatient 2-3 times per day usually.  Having abdominal pain.  Having nausea.  Was scared to eat breakfast this morning.  Admitted with nausea vomiting and chronic pancreatitis  Physical Exam: Vitals:   11/01/21 0452 11/01/21 0819 11/01/21 1138 11/01/21 1300  BP: (!) 140/98 (!) 193/121 (!) 185/103 (!) 142/127  Pulse: 64 61 65  69  Resp: '18 16 18 18  '$ Temp: 98.9 F (37.2 C) 99 F (37.2 C) 97.7 F (36.5 C) 97.7 F (36.5 C)  TempSrc: Oral Oral  Oral  SpO2:  100% 100% 100%  Weight: 58.5 kg     Height:       Physical Exam Exam conducted with a chaperone present.  HENT:     Head: Normocephalic.     Mouth/Throat:     Pharynx: No oropharyngeal exudate.  Eyes:     General: Lids are normal.     Conjunctiva/sclera: Conjunctivae normal.  Cardiovascular:     Rate and Rhythm: Normal rate and regular rhythm.     Heart sounds: Normal heart sounds, S1 normal and S2 normal.  Pulmonary:     Breath sounds: No decreased breath sounds, wheezing, rhonchi or rales.  Abdominal:     Palpations: Abdomen is soft.     Tenderness: There is generalized abdominal tenderness.  Musculoskeletal:     Right lower leg: No swelling.     Left lower leg: No swelling.  Skin:    General: Skin is warm.     Findings: No rash.  Neurological:     Mental Status: She is alert and oriented to person, place, and time.     Data Reviewed: CT scan shows chronic pancreatitis, 1 pancreatic cysts decrease in size from previous and uncinate process cyst measuring 2.3 cm slightly increased from previous. Hemoglobin 15.7, platelet count 256, white blood cell count 5.8, lipase 53, potassium 3.4, creatinine 0.59, magnesium 2.0  Family Communication: Refused  Disposition: Status is: Inpatient Remains inpatient appropriate because: Need to make sure she is able to tolerate diet and off IV pain medications prior to  discharge to psychiatric floor.  Planned Discharge Destination: To psychiatric floor    Time spent: 28 minutes  Author: Loletha Grayer, MD 11/01/2021 2:27 PM  For on call review [email or web address].

## 2021-11-02 DIAGNOSIS — E876 Hypokalemia: Secondary | ICD-10-CM | POA: Diagnosis not present

## 2021-11-02 DIAGNOSIS — R112 Nausea with vomiting, unspecified: Secondary | ICD-10-CM | POA: Diagnosis not present

## 2021-11-02 DIAGNOSIS — F1410 Cocaine abuse, uncomplicated: Secondary | ICD-10-CM | POA: Diagnosis not present

## 2021-11-02 LAB — BASIC METABOLIC PANEL
Anion gap: 9 (ref 5–15)
BUN: 15 mg/dL (ref 6–20)
CO2: 29 mmol/L (ref 22–32)
Calcium: 9.2 mg/dL (ref 8.9–10.3)
Chloride: 96 mmol/L — ABNORMAL LOW (ref 98–111)
Creatinine, Ser: 0.87 mg/dL (ref 0.44–1.00)
GFR, Estimated: >60 mL/min (ref >60)
Glucose, Bld: 216 mg/dL — ABNORMAL HIGH (ref 70–99)
Potassium: 3.1 mmol/L — ABNORMAL LOW (ref 3.5–5.1)
Sodium: 134 mmol/L — ABNORMAL LOW (ref 135–145)

## 2021-11-02 LAB — GLUCOSE, CAPILLARY
Glucose-Capillary: 249 mg/dL — ABNORMAL HIGH (ref 70–99)
Glucose-Capillary: 211 mg/dL — ABNORMAL HIGH (ref 70–99)
Glucose-Capillary: 278 mg/dL — ABNORMAL HIGH (ref 70–99)

## 2021-11-02 MED ORDER — POTASSIUM CHLORIDE CRYS ER 20 MEQ PO TBCR
20.0000 meq | EXTENDED_RELEASE_TABLET | Freq: Once | ORAL | Status: AC
  Administered 2021-11-02: 20 meq via ORAL
  Filled 2021-11-02: qty 1

## 2021-11-02 NOTE — Plan of Care (Signed)
  Problem: Nutritional: Goal: Maintenance of adequate nutrition will improve Outcome: Not Progressing.  Complaints of nausea/vomiting bile green emesis this shift.  Relief intermittently with prn zofran.   Problem: Clinical Measurements: Goal: Ability to maintain clinical measurements within normal limits will improve Outcome: Not Progressing.  B/P elevated this shift.  Prn Hydralazine given as ordered.

## 2021-11-02 NOTE — Progress Notes (Addendum)
Attempted to give pt her evening medications. Pt was asleep when I entered the room. Made 3 attempts to wake pt to get her to take her medication. Pt woke up and acknowledged me two of those times, but closed her eyes again and pretended to sleep when I told her why I was here. Pt had asked for her oxy around 1630, but it was too early to give at that time. Pt asked me to bring it in when it was available again. Pt aware that her oxy is one of the medications I was attempting to give her, but continued to sleep anyway. Medications returned to pyxis and marked as pt refused in [facility]. Pt has also refused her IVFs for this shift with me. MD aware.

## 2021-11-02 NOTE — Progress Notes (Signed)
Received patient accompanied with the sitter at 2:30 pm. Patient alert and oriented. Patient belongs was with patient however the belongs were removed from room and taken to ED and locked in BHU side of ED. Patient was fixated on taking a shower. Patient being monitored by 1:1 sitter. VSS. Patient did complain of "pancreatic pain" rating a 7 was offered tylenol but patient refused. Patient resting in bed.

## 2021-11-02 NOTE — Progress Notes (Signed)
PROGRESS NOTE    Amy Wolfe  RAQ:[id number] DOB: 12/02/1975 DOA: 10/31/2021 PCP: Teodora Medici, DO    Assessment & Plan:   Principal Problem:   Intractable vomiting with nausea Active Problems:   Pancreatitis, chronic ([place])   Pancreatic duct stones   Major depressive disorder, single episode, severe without psychotic features ([place])   Hypokalemia   Portal vein thrombosis   Suicidal ideation   Essential hypertension   Diabetes ([place])   Alcohol use disorder   Cocaine use disorder ([place])   Involuntary commitment   Chronic pancreatitis ([place])  Assessment and Plan: Intractable nausea & vomiting: w/ hx of chronic pancreatitis & hx of pancreatic duct stones & cyst.  Continue on IVFs. Zofran prn. Still w/ nausea today but no vomiting   Major depressive disorder: severity unknown. Involuntary committed in the emergency room. Plan to discharge to psychiatry floor when medically cleared. Psych following and recs apprec    Hypokalemia: potassium given  Portal vein thrombosis: continue on xarelto    Cocaine use disorder: needs illicit drug use cessation counseling    Alcohol use disorder: needs alcohol cessation counseling   DM2: likely well controlled. Continue on SSI w/ accuchecks   HTN: continue on amlodipine       DVT prophylaxis: xarelto  Code Status: full  Family Communication: Disposition Plan: possibly d/c to inpatient psych .  Level of care: Med-Surg  Status is: Inpatient Remains inpatient appropriate because: severity of illness    Consultants:  Psych   Procedures:  Antimicrobials:    Subjective: Pt c/o intermittent nausea  Objective: Vitals:   11/02/21 0027 11/02/21 0347 11/02/21 0721 11/02/21 1227  BP: (!) 175/106 (!) 162/106 (!) 160/95 (!) 142/96  Pulse: 98 95 86 74  Resp: '20 20 17 17  '$ Temp: 98.3 F (36.8 C) 98.8 F (37.1 C) 97.6 F (36.4 C) 97.6 F (36.4 C)  TempSrc: Oral Oral    SpO2: 100% 98% 100% 100%  Weight:       Height:        Intake/Output Summary (Last 24 hours) at 11/02/2021 1436 Last data filed at 11/01/2021 1817 Gross per 24 hour  Intake 120 ml  Output --  Net 120 ml   Filed Weights   10/31/21 0939 11/01/21 0452  Weight: 58.1 kg 58.5 kg    Examination:  General exam: Appears agitated and frustrated Respiratory system: Clear to auscultation. Respiratory effort normal. Cardiovascular system: S1 & S2+. No rubs, gallops or clicks.  Gastrointestinal system: Abdomen is nondistended, soft and tenderness to palpation. Normal bowel sounds heard. Central nervous system: Alert and oriented. Moves all extremities  Psychiatry: Judgement and insight appear normal. Appears agitated and frustrated    Data Reviewed: I have personally reviewed following labs and imaging studies  CBC: Recent Labs  Lab 10/31/21 0949 11/01/21 0619  WBC 5.4 5.8  NEUTROABS 3.1  --   HGB 15.8* 15.7*  HCT 50.1* 49.6*  MCV 85.9 83.8  PLT 408* 456   Basic Metabolic Panel: Recent Labs  Lab 10/31/21 0949 10/31/21 1920 11/01/21 0619 11/02/21 1309  NA 138 141 142 134*  K 2.9* 3.3* 3.4* 3.1*  CL 103 103 107 96*  CO2 '25 27 26 29  '$ GLUCOSE 122* 168* 190* 216*  BUN '10 10 8 15  '$ CREATININE 0.61 0.62 0.59 0.87  CALCIUM 9.2 9.5 8.7* 9.2  MG  --  2.0  --   --    GFR: Estimated Creatinine Clearance: 77 mL/min (by C-G formula based on  SCr of 0.87 mg/dL). Liver Function Tests: Recent Labs  Lab 10/31/21 0949  AST 35  ALT 26  ALKPHOS 104  BILITOT 0.4  PROT 8.5*  ALBUMIN 4.3   Recent Labs  Lab 10/31/21 1920  LIPASE 53*   No results for input(s): "AMMONIA" in the last 168 hours. Coagulation Profile: No results for input(s): "INR", "PROTIME" in the last 168 hours. Cardiac Enzymes: No results for input(s): "CKTOTAL", "CKMB", "CKMBINDEX", "TROPONINI" in the last 168 hours. BNP (last 3 results) No results for input(s): "PROBNP" in the last 8760 hours. HbA1C: No results for input(s): "HGBA1C" in the last  72 hours. CBG: Recent Labs  Lab 11/01/21 0823 11/01/21 1131 11/01/21 2137 11/02/21 1224  GLUCAP 152* 84 132* 249*   Lipid Profile: No results for input(s): "CHOL", "HDL", "LDLCALC", "TRIG", "CHOLHDL", "LDLDIRECT" in the last 72 hours. Thyroid Function Tests: No results for input(s): "TSH", "T4TOTAL", "FREET4", "T3FREE", "THYROIDAB" in the last 72 hours. Anemia Panel: No results for input(s): "VITAMINB12", "FOLATE", "FERRITIN", "TIBC", "IRON", "RETICCTPCT" in the last 72 hours. Sepsis Labs: No results for input(s): "PROCALCITON", "LATICACIDVEN" in the last 168 hours.  Recent Results (from the past 240 hour(s))  Resp Panel by RT-PCR (Flu A&B, Covid) Anterior Nasal Swab     Status: None   Collection Time: 10/31/21  5:44 PM   Specimen: Anterior Nasal Swab  Result Value Ref Range Status   SARS Coronavirus 2 by RT PCR NEGATIVE NEGATIVE Final    Comment: (NOTE) SARS-CoV-2 target nucleic acids are NOT DETECTED.  The SARS-CoV-2 RNA is generally detectable in upper respiratory specimens during the acute phase of infection. The lowest concentration of SARS-CoV-2 viral copies this assay can detect is 138 copies/mL. A negative result does not preclude SARS-Cov-2 infection and should not be used as the sole basis for treatment or other patient management decisions. A negative result may occur with  improper specimen collection/handling, submission of specimen other than nasopharyngeal swab, presence of viral mutation(s) within the areas targeted by this assay, and inadequate number of viral copies(<138 copies/mL). A negative result must be combined with clinical observations, patient history, and epidemiological information. The expected result is Negative.  Fact Sheet for Patients:  EntrepreneurPulse.com.au  Fact Sheet for Healthcare Providers:  IncredibleEmployment.be  This test is no t yet approved or cleared by the [place] FDA and  has  been authorized for detection and/or diagnosis of SARS-CoV-2 by FDA under an Emergency Use Authorization (EUA). This EUA will remain  in effect (meaning this test can be used) for the duration of the COVID-19 declaration under Section 564(b)(1) of the Act, 21 U.S.C.section 360bbb-3(b)(1), unless the authorization is terminated  or revoked sooner.       Influenza A by PCR NEGATIVE NEGATIVE Final   Influenza B by PCR NEGATIVE NEGATIVE Final    Comment: (NOTE) The Xpert Xpress SARS-CoV-2/FLU/RSV plus assay is intended as an aid in the diagnosis of influenza from Nasopharyngeal swab specimens and should not be used as a sole basis for treatment. Nasal washings and aspirates are unacceptable for Xpert Xpress SARS-CoV-2/FLU/RSV testing.  Fact Sheet for Patients: EntrepreneurPulse.com.au  Fact Sheet for Healthcare Providers: IncredibleEmployment.be  This test is not yet approved or cleared by the [place] FDA and has been authorized for detection and/or diagnosis of SARS-CoV-2 by FDA under an Emergency Use Authorization (EUA). This EUA will remain in effect (meaning this test can be used) for the duration of the COVID-19 declaration under Section 564(b)(1) of the Act, 21 U.S.C. section  360bbb-3(b)(1), unless the authorization is terminated or revoked.  Performed at [facility], [address] [place], [place] [address]          Radiology Studies: No results found.      Scheduled Meds:  acetaminophen  650 mg Oral Once   amLODipine  5 mg Oral Daily   insulin aspart  0-5 Units Subcutaneous QHS   insulin aspart  0-9 Units Subcutaneous TID WC   insulin glargine-yfgn  6 Units Subcutaneous QHS   lipase/protease/amylase  72,000 Units Oral TID WC   multivitamin with minerals  1 tablet Oral Once   nicotine  21 mg Transdermal Daily   ondansetron (ZOFRAN) IV  4 mg Intravenous Once   pantoprazole  40 mg Oral Daily    rivaroxaban  20 mg Oral Q supper   sodium chloride flush  3 mL Intravenous Q12H   Continuous Infusions:  0.9 % NaCl with KCl 20 mEq / L       LOS: 1 day    Time spent: 33 mins     Wyvonnia Dusky, MD [facility] Pager [phone]  If 7PM-7AM, please contact night-coverage [email or web address] 11/02/2021, 2:36 PM

## 2021-11-02 NOTE — Progress Notes (Signed)
Patient belongs were removed from room and taken to ED and locked in BHU side of the ED.
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
## 2021-11-03 ENCOUNTER — Other Ambulatory Visit: Payer: Self-pay

## 2021-11-03 ENCOUNTER — Encounter: Payer: Self-pay | Admitting: Psychiatry

## 2021-11-03 ENCOUNTER — Inpatient Hospital Stay
Admission: AD | Admit: 2021-11-03 | Discharge: 2021-11-07 | DRG: 885 | Disposition: A | Discharge status: Home / Self Care | Payer: Medicaid Other | Source: Intra-hospital | Attending: Psychiatry | Admitting: Psychiatry

## 2021-11-03 DIAGNOSIS — Z7901 Long term (current) use of anticoagulants: Secondary | ICD-10-CM

## 2021-11-03 DIAGNOSIS — F332 Major depressive disorder, recurrent severe without psychotic features: Principal | ICD-10-CM | POA: Diagnosis present

## 2021-11-03 DIAGNOSIS — K5900 Constipation, unspecified: Secondary | ICD-10-CM | POA: Diagnosis present

## 2021-11-03 DIAGNOSIS — Z801 Family history of malignant neoplasm of trachea, bronchus and lung: Secondary | ICD-10-CM

## 2021-11-03 DIAGNOSIS — K860 Alcohol-induced chronic pancreatitis: Secondary | ICD-10-CM | POA: Diagnosis present

## 2021-11-03 DIAGNOSIS — F17210 Nicotine dependence, cigarettes, uncomplicated: Secondary | ICD-10-CM | POA: Diagnosis present

## 2021-11-03 DIAGNOSIS — F1010 Alcohol abuse, uncomplicated: Secondary | ICD-10-CM | POA: Diagnosis present

## 2021-11-03 DIAGNOSIS — E876 Hypokalemia: Secondary | ICD-10-CM | POA: Diagnosis not present

## 2021-11-03 DIAGNOSIS — Z794 Long term (current) use of insulin: Secondary | ICD-10-CM | POA: Diagnosis not present

## 2021-11-03 DIAGNOSIS — F32A Depression, unspecified: Secondary | ICD-10-CM | POA: Diagnosis not present

## 2021-11-03 DIAGNOSIS — Z808 Family history of malignant neoplasm of other organs or systems: Secondary | ICD-10-CM | POA: Diagnosis not present

## 2021-11-03 DIAGNOSIS — G894 Chronic pain syndrome: Secondary | ICD-10-CM | POA: Diagnosis present

## 2021-11-03 DIAGNOSIS — R45851 Suicidal ideations: Secondary | ICD-10-CM | POA: Diagnosis present

## 2021-11-03 DIAGNOSIS — Z90710 Acquired absence of both cervix and uterus: Secondary | ICD-10-CM

## 2021-11-03 DIAGNOSIS — K861 Other chronic pancreatitis: Secondary | ICD-10-CM | POA: Diagnosis present

## 2021-11-03 DIAGNOSIS — Z833 Family history of diabetes mellitus: Secondary | ICD-10-CM | POA: Diagnosis not present

## 2021-11-03 DIAGNOSIS — I81 Portal vein thrombosis: Secondary | ICD-10-CM | POA: Diagnosis present

## 2021-11-03 DIAGNOSIS — Z8249 Family history of ischemic heart disease and other diseases of the circulatory system: Secondary | ICD-10-CM

## 2021-11-03 DIAGNOSIS — K219 Gastro-esophageal reflux disease without esophagitis: Secondary | ICD-10-CM | POA: Diagnosis present

## 2021-11-03 DIAGNOSIS — G4700 Insomnia, unspecified: Secondary | ICD-10-CM | POA: Diagnosis present

## 2021-11-03 DIAGNOSIS — I10 Essential (primary) hypertension: Secondary | ICD-10-CM | POA: Diagnosis present

## 2021-11-03 DIAGNOSIS — F419 Anxiety disorder, unspecified: Secondary | ICD-10-CM | POA: Diagnosis present

## 2021-11-03 DIAGNOSIS — R112 Nausea with vomiting, unspecified: Secondary | ICD-10-CM | POA: Diagnosis not present

## 2021-11-03 DIAGNOSIS — I2510 Atherosclerotic heart disease of native coronary artery without angina pectoris: Secondary | ICD-10-CM | POA: Diagnosis present

## 2021-11-03 DIAGNOSIS — Z86718 Personal history of other venous thrombosis and embolism: Secondary | ICD-10-CM | POA: Diagnosis not present

## 2021-11-03 DIAGNOSIS — E0865 Diabetes mellitus due to underlying condition with hyperglycemia: Secondary | ICD-10-CM

## 2021-11-03 DIAGNOSIS — E1165 Type 2 diabetes mellitus with hyperglycemia: Secondary | ICD-10-CM | POA: Diagnosis present

## 2021-11-03 LAB — BASIC METABOLIC PANEL
Anion gap: 8 (ref 5–15)
BUN: 14 mg/dL (ref 6–20)
CO2: 29 mmol/L (ref 22–32)
Calcium: 9.4 mg/dL (ref 8.9–10.3)
Chloride: 99 mmol/L (ref 98–111)
Creatinine, Ser: 0.88 mg/dL (ref 0.44–1.00)
GFR, Estimated: >60 mL/min (ref >60)
Glucose, Bld: 185 mg/dL — ABNORMAL HIGH (ref 70–99)
Potassium: 3.4 mmol/L — ABNORMAL LOW (ref 3.5–5.1)
Sodium: 136 mmol/L (ref 135–145)

## 2021-11-03 LAB — CBC
HCT: 53.6 % — ABNORMAL HIGH (ref 36.0–46.0)
Hemoglobin: 17.2 g/dL — ABNORMAL HIGH (ref 12.0–15.0)
MCH: 26.9 pg (ref 26.0–34.0)
MCHC: 32.1 g/dL (ref 30.0–36.0)
MCV: 83.9 fL (ref 80.0–100.0)
Platelets: 295 10*3/uL (ref 150–400)
RBC: 6.39 MIL/uL — ABNORMAL HIGH (ref 3.87–5.11)
RDW: 13.4 % (ref 11.5–15.5)
WBC: 4.6 10*3/uL (ref 4.0–10.5)
nRBC: 0 % (ref 0.0–0.2)

## 2021-11-03 LAB — GLUCOSE, CAPILLARY
Glucose-Capillary: 209 mg/dL — ABNORMAL HIGH (ref 70–99)
Glucose-Capillary: 91 mg/dL (ref 70–99)
Glucose-Capillary: 340 mg/dL — ABNORMAL HIGH (ref 70–99)
Glucose-Capillary: 115 mg/dL — ABNORMAL HIGH (ref 70–99)

## 2021-11-03 MED ORDER — ONDANSETRON HCL 4 MG PO TABS
4.0000 mg | ORAL_TABLET | Freq: Four times a day (QID) | ORAL | Status: DC | PRN
  Administered 2021-11-03 – 2021-11-04 (×2): 4 mg via ORAL
  Filled 2021-11-03 (×2): qty 1

## 2021-11-03 MED ORDER — PANCRELIPASE (LIP-PROT-AMYL) 36000-114000 UNITS PO CPEP
36000.0000 [IU] | ORAL_CAPSULE | Freq: Two times a day (BID) | ORAL | Status: DC | PRN
  Administered 2021-11-03 – 2021-11-05 (×3): 36000 [IU] via ORAL
  Filled 2021-11-03 (×3): qty 1

## 2021-11-03 MED ORDER — NICOTINE 21 MG/24HR TD PT24
21.0000 mg | MEDICATED_PATCH | Freq: Every day | TRANSDERMAL | Status: DC
  Filled 2021-11-03: qty 1

## 2021-11-03 MED ORDER — SILVER SULFADIAZINE 1 % EX CREA
TOPICAL_CREAM | Freq: Two times a day (BID) | CUTANEOUS | Status: DC
  Filled 2021-11-03: qty 85

## 2021-11-03 MED ORDER — INSULIN ASPART 100 UNIT/ML IJ SOLN
3.0000 [IU] | Freq: Three times a day (TID) | INTRAMUSCULAR | Status: DC
  Administered 2021-11-03 – 2021-11-07 (×12): 3 [IU] via SUBCUTANEOUS
  Filled 2021-11-03 (×10): qty 1

## 2021-11-03 MED ORDER — MORPHINE SULFATE (PF) 2 MG/ML IV SOLN
1.0000 mg | Freq: Once | INTRAVENOUS | Status: AC
  Administered 2021-11-03: 1 mg via INTRAVENOUS
  Filled 2021-11-03: qty 1

## 2021-11-03 MED ORDER — PANTOPRAZOLE SODIUM 40 MG PO TBEC
40.0000 mg | DELAYED_RELEASE_TABLET | Freq: Every day | ORAL | Status: DC
  Administered 2021-11-03 – 2021-11-07 (×5): 40 mg via ORAL
  Filled 2021-11-03 (×5): qty 1

## 2021-11-03 MED ORDER — INSULIN ASPART 100 UNIT/ML IJ SOLN
0.0000 [IU] | Freq: Three times a day (TID) | INTRAMUSCULAR | Status: DC
  Administered 2021-11-03: 7 [IU] via SUBCUTANEOUS
  Administered 2021-11-04: 2 [IU] via SUBCUTANEOUS
  Administered 2021-11-04: 3 [IU] via SUBCUTANEOUS
  Administered 2021-11-05: 2 [IU] via SUBCUTANEOUS
  Administered 2021-11-06 – 2021-11-07 (×2): 1 [IU] via SUBCUTANEOUS
  Administered 2021-11-07: 3 [IU] via SUBCUTANEOUS
  Filled 2021-11-03 (×6): qty 1

## 2021-11-03 MED ORDER — MAGNESIUM HYDROXIDE 400 MG/5ML PO SUSP
30.0000 mL | Freq: Every day | ORAL | Status: DC | PRN
  Administered 2021-11-05: 30 mL via ORAL
  Filled 2021-11-03: qty 30

## 2021-11-03 MED ORDER — INSULIN GLARGINE-YFGN 100 UNIT/ML ~~LOC~~ SOLN
6.0000 [IU] | Freq: Every day | SUBCUTANEOUS | Status: DC
  Administered 2021-11-04 – 2021-11-07 (×4): 6 [IU] via SUBCUTANEOUS
  Filled 2021-11-03 (×4): qty 0.06

## 2021-11-03 MED ORDER — ALUM & MAG HYDROXIDE-SIMETH 200-200-20 MG/5ML PO SUSP: 30.0000 mL | ORAL | Status: DC | PRN

## 2021-11-03 MED ORDER — OXYCODONE-ACETAMINOPHEN 5-325 MG PO TABS
1.0000 | ORAL_TABLET | Freq: Four times a day (QID) | ORAL | Status: DC | PRN
  Administered 2021-11-03: 2 via ORAL
  Filled 2021-11-03: qty 2

## 2021-11-03 MED ORDER — INSULIN GLARGINE-YFGN 100 UNIT/ML ~~LOC~~ SOLN
6.0000 [IU] | Freq: Every day | SUBCUTANEOUS | Status: DC
  Filled 2021-11-03: qty 0.06

## 2021-11-03 MED ORDER — RIVAROXABAN 20 MG PO TABS
20.0000 mg | ORAL_TABLET | Freq: Every day | ORAL | Status: DC
  Administered 2021-11-03 – 2021-11-06 (×4): 20 mg via ORAL
  Filled 2021-11-03 (×5): qty 1

## 2021-11-03 MED ORDER — OXYCODONE-ACETAMINOPHEN 5-325 MG PO TABS: 1.0000 | ORAL_TABLET | Freq: Four times a day (QID) | ORAL | 0 refills | Status: DC | PRN

## 2021-11-03 MED ORDER — PANCRELIPASE (LIP-PROT-AMYL) 36000-114000 UNITS PO CPEP
72000.0000 [IU] | ORAL_CAPSULE | Freq: Three times a day (TID) | ORAL | Status: DC
  Administered 2021-11-03 – 2021-11-07 (×12): 72000 [IU] via ORAL
  Filled 2021-11-03 (×12): qty 2

## 2021-11-03 MED ORDER — OXYCODONE-ACETAMINOPHEN 5-325 MG PO TABS
1.0000 | ORAL_TABLET | Freq: Four times a day (QID) | ORAL | Status: DC | PRN
  Administered 2021-11-03: 1 via ORAL
  Administered 2021-11-03 – 2021-11-04 (×3): 2 via ORAL
  Administered 2021-11-04: 1 via ORAL
  Administered 2021-11-04 – 2021-11-05 (×2): 2 via ORAL
  Filled 2021-11-03 (×3): qty 2
  Filled 2021-11-03 (×2): qty 1
  Filled 2021-11-03 (×2): qty 2

## 2021-11-03 MED ORDER — POTASSIUM CHLORIDE CRYS ER 20 MEQ PO TBCR
40.0000 meq | EXTENDED_RELEASE_TABLET | Freq: Once | ORAL | Status: DC
  Filled 2021-11-03: qty 2

## 2021-11-03 MED ORDER — INSULIN ASPART 100 UNIT/ML IJ SOLN
0.0000 [IU] | Freq: Every day | INTRAMUSCULAR | Status: DC
  Administered 2021-11-04: 3 [IU] via SUBCUTANEOUS
  Filled 2021-11-03: qty 1

## 2021-11-03 MED ORDER — INSULIN ASPART 100 UNIT/ML IJ SOLN: 3.0000 [IU] | Freq: Three times a day (TID) | INTRAMUSCULAR | Status: DC

## 2021-11-03 MED ORDER — AMLODIPINE BESYLATE 5 MG PO TABS
5.0000 mg | ORAL_TABLET | Freq: Every day | ORAL | Status: DC
  Administered 2021-11-04 – 2021-11-07 (×4): 5 mg via ORAL
  Filled 2021-11-03 (×4): qty 1

## 2021-11-03 MED ORDER — ONDANSETRON HCL 4 MG/2ML IJ SOLN: 4.0000 mg | Freq: Four times a day (QID) | INTRAMUSCULAR | Status: DC | PRN

## 2021-11-03 MED ORDER — ACETAMINOPHEN 325 MG PO TABS
650.0000 mg | ORAL_TABLET | Freq: Four times a day (QID) | ORAL | Status: DC | PRN
  Administered 2021-11-05 – 2021-11-06 (×2): 650 mg via ORAL
  Filled 2021-11-03 (×2): qty 2

## 2021-11-03 NOTE — Progress Notes (Signed)
BG=91; Pt has not eaten breakfast, refuses to eat lunch at this time due to nausea.  Held long acting insulin, will recheck BG later.  Dr Jimmye Norman made aware.

## 2021-11-03 NOTE — Progress Notes (Signed)
Patient made aware of bed availability and transfer.  Report given to [place] at [facility].  IVC paper in chart.  Security made aware of transfer.

## 2021-11-03 NOTE — Discharge Summary (Signed)
Physician Discharge Summary  Amy Wolfe OIL:[id number] DOB: 1975-07-28 DOA: 10/31/2021  PCP: Teodora Medici, DO  Admit date: 10/31/2021 Discharge date: 11/03/2021  Admitted From: home  Disposition:  inpatient [facility] [place] [facility] unit   Recommendations for Outpatient Follow-up:  Follow up with PCP in 1-2 weeks F/u w/ psych ASAP   Home Health: no  Equipment/Devices:  Discharge Condition: stable  CODE STATUS: full  Diet recommendation: Heart Healthy / Carb Modified  Brief/Interim Summary: HPI was taken from Dr. Damita Dunnings: Amy Wolfe is a 46 y.o. 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  The triage note, she threatened to kill herself if she did not get to see him.  After arrival to the emergency room she voiced suicidal ideation and was seen by behavioral health with plans to admit to behavioral health for major depressive disorder.  While in the ED she developed intractable vomiting which was not resolved after several antiemetics. ED course and data review: Initial BP was 139/102 with otherwise normal vitals.  EtOH level was 203 and UDS positive for cocaine and cannabinoids.  Initial potassium was 2.9 which corrected to 3.3 with repletion.  LFTs were within normal limits and lipase 53.  Creatinine normal at 0.62.  WBC normal and hemoglobin 15.8. Antiemetics administered in the ED included droperidol, Zofran, Reglan and she also received fluid boluses. Medical admission was requested for intractable vomiting.    As per Dr. Leslye Peer: 46 year old female with past medical history of alcohol associated chronic  pancreatitis.  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  History of portal vein thrombosis on Xarelto, type 2 diabetes mellitus hypertension chronic pain.  Patient was involuntary committed in the emergency room.  Patient was admitted medically for chronic pancreatitis and nausea or vomiting.   As per Dr. Jimmye Norman 11/1-11/2/23: Pt has hx of chronic pain from chronic pancreatitis. Pt was refusing meds and requesting pain meds. Pt is on zofran and pain meds at home. Psych evaluated pt in ED and recommended pt be admitted to inpatient psych after the pt was medically cleared.    Discharge Diagnoses:  Principal Problem:   Intractable vomiting with nausea Active Problems:   Pancreatitis, chronic (HCC)   Pancreatic duct stones   Major depressive disorder, single episode, severe without psychotic features ([place])   Hypokalemia   Portal vein thrombosis   Suicidal ideation   Essential hypertension   Diabetes ([place])   Alcohol use disorder   Cocaine use disorder ([place])   Involuntary commitment   Chronic pancreatitis ([place])  Noncompliance: refusing to take meds and only asking for pain meds    Intractable nausea & vomiting: w/ hx of chronic pancreatitis & hx of pancreatic duct stones & cyst.  Continue on IVFs. Zofran prn. On zofran at home prn    Major depressive disorder: severity unknown. Involuntary committed in the emergency room. Will need inpatient psych when medically cleared    Hypokalemia: KCl repleated   Portal vein thrombosis: continue on xarelto    Cocaine use disorder: needs illicit drug use cessation counseling    Alcohol use disorder: needs alcohol cessation counseling    DM2: likely well controlled. Continue on  glargine, SSI w/ accuchecks    HTN: continue on amlodipine   Discharge Instructions  Discharge Instructions     Diet - low sodium heart healthy   Complete by: As directed    Diet Carb Modified   Complete by: As directed    Discharge  instructions   Complete by: As directed    F/u w/ PCP in 1-2 weeks. F/u w/ psychiatry as soon as possible   Increase activity slowly   Complete by: As directed       Allergies as of 11/03/2021   No Known Allergies      Medication List     STOP taking these medications    (feeding supplement) PROSource Plus liquid   polyethylene glycol 17 g packet Commonly known as: MIRALAX / GLYCOLAX   silver sulfADIAZINE 1 % cream Commonly known as: SILVADENE       TAKE these medications    Accu-Chek Guide test strip Generic drug: glucose blood USE TO CHECK BLOOD SUGAR UP TO 4 TIMES DAILY AS DIRECTED   Accu-Chek Softclix Lancets lancets USE TO CHECK BLOOD SUGAR UP TO 4 TIMES DAILY AS DIRECTED   acetaminophen 500 MG tablet Commonly known as: TYLENOL Take 2 tablets (1,000 mg total) by mouth every 6 (six) hours as needed for mild pain, fever or headache (or Fever >/= 101). DO Not Take if taking the Percocet's as well   amLODipine 5 MG tablet Commonly known as: NORVASC Take 1 tablet (5 mg total) by mouth daily.   blood glucose meter kit and supplies Dispense based on patient and insurance preference. Use up to four times daily as directed. (FOR ICD-10 E10.9, E11.9).   blood glucose meter kit and supplies Kit Dispense based on patient and insurance preference. Use up to four times daily as directed.   Levemir FlexTouch 100 UNIT/ML FlexPen Generic drug: insulin detemir Inject 6 Units into the skin daily.   lipase/protease/amylase 36000 UNITS Cpep capsule Commonly known as: Creon Take 1-2 capsules (36,000-72,000 Units total) by mouth See admin instructions. Take 2 capsules (72000u) by mouth three times daily with meals and take 1 capsule (36000u) by mouth twice daily with snacks   Multivitamin Adult Chew Chew 1 each by mouth daily.   nicotine 21 mg/24hr patch Commonly known as: NICODERM CQ - dosed in mg/24 hours Place 1 patch (21 mg total) onto the skin daily.   ondansetron  8 MG tablet Commonly known as: Zofran Take 1 tablet (8 mg total) by mouth every 8 (eight) hours as needed for nausea or vomiting.   oxyCODONE-acetaminophen 5-325 MG tablet Commonly known as: PERCOCET/ROXICET Take 1-2 tablets by mouth every 6 (six) hours as needed for moderate pain or severe pain.   pantoprazole 40 MG tablet Commonly known as: PROTONIX Take 1 tablet (40 mg total) by mouth 2 (two) times daily before a meal.   Pen Needles 30G X 8 MM Misc 1 each by Does not apply route daily.   Unifine Pentips 31G X 5 MM Misc Generic drug: Insulin Pen Needle use with levemir flexpen daily   Xarelto 20 MG Tabs tablet Generic drug: rivaroxaban Take 1 tablet (20 mg total) by mouth daily with supper.        No Known Allergies  Consultations: Psych    Procedures/Studies: DG Shoulder Right  Result Date: 10/17/2021 CLINICAL DATA:  Anterior dislocation, postreduction EXAM: RIGHT SHOULDER - 2+ VIEW COMPARISON:  Study done earlier today FINDINGS: There is interval reduction of anterior dislocation. No fracture lines  are seen. IMPRESSION: Satisfactory interval reduction of anterior dislocation. Electronically Signed   By: Elmer Picker M.D.   On: 10/17/2021 15:16   DG Shoulder Right  Result Date: 10/17/2021 CLINICAL DATA:  Pain and deformity EXAM: RIGHT SHOULDER - 2+ VIEW COMPARISON:  None Available. FINDINGS: There is anterior subcoracoid dislocation of humeral head in relation to glenoid. No fracture lines are seen. IMPRESSION: Anterior dislocation. Electronically Signed   By: Elmer Picker M.D.   On: 10/17/2021 13:26   (Echo, Carotid, EGD, Colonoscopy, ERCP)    Subjective: Pt c/o pain   Discharge Exam: Vitals:   11/02/21 1227 11/02/21 2035  BP: (!) 142/96 (!) 135/96  Pulse: 74 97  Resp: 17 18  Temp: 97.6 F (36.4 C) 98.2 F (36.8 C)  SpO2: 100% 100%   Vitals:   11/02/21 0347 11/02/21 0721 11/02/21 1227 11/02/21 2035  BP: (!) 162/106 (!) 160/95 (!) 142/96  (!) 135/96  Pulse: 95 86 74 97  Resp: _0 Temp: 98.8 F (37.1 C) 97.6 F (36.4 C) 97.6 F (36.4 C) 98.2 F (36.8 C)  TempSrc: Oral   Oral  SpO2: 98% 100% 100% 100%  Weight:      Height:        General: Pt is alert, awake, not in acute distress. Appears older than stated age  Cardiovascular: S1/S2 +, no rubs, no gallops Respiratory: CTA bilaterally, no wheezing, no rhonchi Abdominal: Soft, NT, ND, bowel sounds + Extremities: no edema, no cyanosis    The results of significant diagnostics from this hospitalization (including imaging, microbiology, ancillary and laboratory) are listed below for reference.     Microbiology: Recent Results (from the past 240 hour(s))  Resp Panel by RT-PCR (Flu A&B, Covid) Anterior Nasal Swab     Status: None   Collection Time: 10/31/21  5:44 PM   Specimen: Anterior Nasal Swab  Result Value Ref Range Status   SARS Coronavirus 2 by RT PCR NEGATIVE NEGATIVE Final    Comment: (NOTE) SARS-CoV-2 target nucleic acids are NOT DETECTED.  The SARS-CoV-2 RNA is generally detectable in upper respiratory specimens during the acute phase of infection. The lowest concentration of SARS-CoV-2 viral copies this assay can detect is 138 copies/mL. A negative result does not preclude SARS-Cov-2 infection and should not be used as the sole basis for treatment or other patient management decisions. A negative result may occur with  improper specimen collection/handling, submission of specimen other than nasopharyngeal swab, presence of viral mutation(s) within the areas targeted by this assay, and inadequate number of viral copies(<138 copies/mL). A negative result must be combined with clinical observations, patient history, and epidemiological information. The expected result is Negative.  Fact Sheet for Patients:  EntrepreneurPulse.com.au  Fact Sheet for Healthcare Providers:  IncredibleEmployment.be  This test  is no t yet approved or cleared by the [place] FDA and  has been authorized for detection and/or diagnosis of SARS-CoV-2 by FDA under an Emergency Use Authorization (EUA). This EUA will remain  in effect (meaning this test can be used) for the duration of the COVID-19 declaration under Section 564(b)(1) of the Act, 21 U.S.C.section 360bbb-3(b)(1), unless the authorization is terminated  or revoked sooner.       Influenza A by PCR NEGATIVE NEGATIVE Final   Influenza B by PCR NEGATIVE NEGATIVE Final    Comment: (NOTE) The Xpert Xpress SARS-CoV-2/FLU/RSV plus assay is intended as an aid in the diagnosis of influenza from Nasopharyngeal swab specimens and should not be  used as a sole basis for treatment. Nasal washings and aspirates are unacceptable for Xpert Xpress SARS-CoV-2/FLU/RSV testing.  Fact Sheet for Patients: EntrepreneurPulse.com.au  Fact Sheet for Healthcare Providers: IncredibleEmployment.be  This test is not yet approved or cleared by the [place] FDA and has been authorized for detection and/or diagnosis of SARS-CoV-2 by FDA under an Emergency Use Authorization (EUA). This EUA will remain in effect (meaning this test can be used) for the duration of the COVID-19 declaration under Section 564(b)(1) of the Act, 21 U.S.C. section 360bbb-3(b)(1), unless the authorization is terminated or revoked.  Performed at [facility], [address] [place], [place] [address]      Labs: BNP (last 3 results) No results for input(s): "BNP" in the last 8760 hours. Basic Metabolic Panel: Recent Labs  Lab 10/31/21 0949 10/31/21 1920 11/01/21 0619 11/02/21 1309 11/03/21 0914  NA 138 141 142 134* 136  K 2.9* 3.3* 3.4* 3.1* 3.4*  CL 103 103 107 96* 99  CO2 _0 GLUCOSE 122* 168* 190* 216* 185*  BUN _1 CREATININE 0.61 0.62 0.59 0.87 0.88  CALCIUM 9.2 9.5 8.7* 9.2 9.4  MG  --  2.0  --   --   --     Liver Function Tests: Recent Labs  Lab 10/31/21 0949  AST 35  ALT 26  ALKPHOS 104  BILITOT 0.4  PROT 8.5*  ALBUMIN 4.3   Recent Labs  Lab 10/31/21 1920  LIPASE 53*   No results for input(s): "AMMONIA" in the last 168 hours. CBC: Recent Labs  Lab 10/31/21 0949 11/01/21 0619 11/03/21 0914  WBC 5.4 5.8 4.6  NEUTROABS 3.1  --   --   HGB 15.8* 15.7* 17.2*  HCT 50.1* 49.6* 53.6*  MCV 85.9 83.8 83.9  PLT 408* 256 295   Cardiac Enzymes: No results for input(s): "CKTOTAL", "CKMB", "CKMBINDEX", "TROPONINI" in the last 168 hours. BNP: Invalid input(s): "POCBNP" CBG: Recent Labs  Lab 11/02/21 1224 11/02/21 1605 11/02/21 2037 11/03/21 0913 11/03/21 1238  GLUCAP 249* 211* 278* 209* 91   D-Dimer No results for input(s): "DDIMER" in the last 72 hours. Hgb A1c No results for input(s): "HGBA1C" in the last 72 hours. Lipid Profile No results for input(s): "CHOL", "HDL", "LDLCALC", "TRIG", "CHOLHDL", "LDLDIRECT" in the last 72 hours. Thyroid function studies No results for input(s): "TSH", "T4TOTAL", "T3FREE", "THYROIDAB" in the last 72 hours.  Invalid input(s): "FREET3" Anemia work up No results for input(s): "VITAMINB12", "FOLATE", "FERRITIN", "TIBC", "IRON", "RETICCTPCT" in the last 72 hours. Urinalysis    Component Value Date/Time   COLORURINE YELLOW (A) 10/16/2021 0453   APPEARANCEUR CLOUDY (A) 10/16/2021 0453   LABSPEC 1.040 (H) 10/16/2021 0453   PHURINE 5.0 10/16/2021 0453   GLUCOSEU NEGATIVE 10/16/2021 0453   HGBUR SMALL (A) 10/16/2021 0453   BILIRUBINUR NEGATIVE 10/16/2021 0453   KETONESUR 20 (A) 10/16/2021 0453   PROTEINUR 30 (A) 10/16/2021 0453   NITRITE NEGATIVE 10/16/2021 0453   LEUKOCYTESUR SMALL (A) 10/16/2021 0453   Sepsis Labs Recent Labs  Lab 10/31/21 0949 11/01/21 0619 11/03/21 0914  WBC 5.4 5.8 4.6   Microbiology Recent Results (from the past 240 hour(s))  Resp Panel by RT-PCR (Flu A&B, Covid) Anterior Nasal Swab     Status: None    Collection Time: 10/31/21  5:44 PM   Specimen: Anterior Nasal Swab  Result Value Ref Range Status   SARS Coronavirus 2 by RT PCR NEGATIVE NEGATIVE Final  Comment: (NOTE) SARS-CoV-2 target nucleic acids are NOT DETECTED.  The SARS-CoV-2 RNA is generally detectable in upper respiratory specimens during the acute phase of infection. The lowest concentration of SARS-CoV-2 viral copies this assay can detect is 138 copies/mL. A negative result does not preclude SARS-Cov-2 infection and should not be used as the sole basis for treatment or other patient management decisions. A negative result may occur with  improper specimen collection/handling, submission of specimen other than nasopharyngeal swab, presence of viral mutation(s) within the areas targeted by this assay, and inadequate number of viral copies(<138 copies/mL). A negative result must be combined with clinical observations, patient history, and epidemiological information. The expected result is Negative.  Fact Sheet for Patients:  EntrepreneurPulse.com.au  Fact Sheet for Healthcare Providers:  IncredibleEmployment.be  This test is no t yet approved or cleared by the [place] FDA and  has been authorized for detection and/or diagnosis of SARS-CoV-2 by FDA under an Emergency Use Authorization (EUA). This EUA will remain  in effect (meaning this test can be used) for the duration of the COVID-19 declaration under Section 564(b)(1) of the Act, 21 U.S.C.section 360bbb-3(b)(1), unless the authorization is terminated  or revoked sooner.       Influenza A by PCR NEGATIVE NEGATIVE Final   Influenza B by PCR NEGATIVE NEGATIVE Final    Comment: (NOTE) The Xpert Xpress SARS-CoV-2/FLU/RSV plus assay is intended as an aid in the diagnosis of influenza from Nasopharyngeal swab specimens and should not be used as a sole basis for treatment. Nasal washings and aspirates are unacceptable for  Xpert Xpress SARS-CoV-2/FLU/RSV testing.  Fact Sheet for Patients: EntrepreneurPulse.com.au  Fact Sheet for Healthcare Providers: IncredibleEmployment.be  This test is not yet approved or cleared by the [place] FDA and has been authorized for detection and/or diagnosis of SARS-CoV-2 by FDA under an Emergency Use Authorization (EUA). This EUA will remain in effect (meaning this test can be used) for the duration of the COVID-19 declaration under Section 564(b)(1) of the Act, 21 U.S.C. section 360bbb-3(b)(1), unless the authorization is terminated or revoked.  Performed at [facility], [address] [place], [place] [address]      Time coordinating discharge: Over 30 minutes  SIGNED:   Wyvonnia Dusky, MD  [facility] 11/03/2021, 1:58 PM Pager   If 7PM-7AM, please contact night-coverage [email or web address]

## 2021-11-03 NOTE — Progress Notes (Signed)
PROGRESS NOTE    Amy Wolfe  DJS:[id number] DOB: September 12, 1975 DOA: 10/31/2021 PCP: Teodora Medici, DO    Assessment & Plan:   Principal Problem:   Intractable vomiting with nausea Active Problems:   Pancreatitis, chronic ([place])   Pancreatic duct stones   Major depressive disorder, single episode, severe without psychotic features ([place])   Hypokalemia   Portal vein thrombosis   Suicidal ideation   Essential hypertension   Diabetes ([place])   Alcohol use disorder   Cocaine use disorder ([place])   Involuntary commitment   Chronic pancreatitis ([place])  Assessment and Plan: Noncompliance: refusing to take meds and only asking for pain meds   Intractable nausea & vomiting: w/ hx of chronic pancreatitis & hx of pancreatic duct stones & cyst.  Continue on IVFs. Zofran prn.Has not required zofran so far today   Major depressive disorder: severity unknown. Involuntary committed in the emergency room. Will need inpatient psych when medically cleared   Hypokalemia: KCl repleated  Portal vein thrombosis: continue on xarelto    Cocaine use disorder: needs illicit drug use cessation counseling    Alcohol use disorder: needs alcohol cessation counseling   DM2: likely well controlled. Continue on glargine, SSI w/ accuchecks   HTN: continue on amlodipine      DVT prophylaxis: xarelto  Code Status: full  Family Communication: Disposition Plan: medically stable.   Level of care: Med-Surg  Status is: Inpatient Remains inpatient appropriate because: medically stable. Needs inpatient psych placement     Consultants:  Psych   Procedures:  Antimicrobials:    Subjective: Pt is requesting IV morphine   Objective: Vitals:   11/02/21 0347 11/02/21 0721 11/02/21 1227 11/02/21 2035  BP: (!) 162/106 (!) 160/95 (!) 142/96 (!) 135/96  Pulse: 95 86 74 97  Resp: '20 17 17 18  '$ Temp: 98.8 F (37.1 C) 97.6 F (36.4 C) 97.6 F (36.4 C) 98.2 F (36.8 C)  TempSrc: Oral    Oral  SpO2: 98% 100% 100% 100%  Weight:      Height:        Intake/Output Summary (Last 24 hours) at 11/03/2021 0738 Last data filed at 11/02/2021 2045 Gross per 24 hour  Intake 360 ml  Output 1 ml  Net 359 ml   Filed Weights   10/31/21 0939 11/01/21 0452  Weight: 58.1 kg 58.5 kg    Examination:  General exam: Appears agitated & frustrated Respiratory system: clear breath sounds b/l  Cardiovascular system: S1/S2+. No rubs or gallops Gastrointestinal system: Abd is soft, NT, ND & hypoactive bowel sounds  Central nervous system: Alert & oriented. Moves all extremities  Psychiatry: Judgement and insight appear normal. Appears agitated and frustrated     Data Reviewed: I have personally reviewed following labs and imaging studies  CBC: Recent Labs  Lab 10/31/21 0949 11/01/21 0619  WBC 5.4 5.8  NEUTROABS 3.1  --   HGB 15.8* 15.7*  HCT 50.1* 49.6*  MCV 85.9 83.8  PLT 408* 885   Basic Metabolic Panel: Recent Labs  Lab 10/31/21 0949 10/31/21 1920 11/01/21 0619 11/02/21 1309  NA 138 141 142 134*  K 2.9* 3.3* 3.4* 3.1*  CL 103 103 107 96*  CO2 '25 27 26 29  '$ GLUCOSE 122* 168* 190* 216*  BUN '10 10 8 15  '$ CREATININE 0.61 0.62 0.59 0.87  CALCIUM 9.2 9.5 8.7* 9.2  MG  --  2.0  --   --    GFR: Estimated Creatinine Clearance: 77 mL/min (by C-G formula  based on SCr of 0.87 mg/dL). Liver Function Tests: Recent Labs  Lab 10/31/21 0949  AST 35  ALT 26  ALKPHOS 104  BILITOT 0.4  PROT 8.5*  ALBUMIN 4.3   Recent Labs  Lab 10/31/21 1920  LIPASE 53*   No results for input(s): "AMMONIA" in the last 168 hours. Coagulation Profile: No results for input(s): "INR", "PROTIME" in the last 168 hours. Cardiac Enzymes: No results for input(s): "CKTOTAL", "CKMB", "CKMBINDEX", "TROPONINI" in the last 168 hours. BNP (last 3 results) No results for input(s): "PROBNP" in the last 8760 hours. HbA1C: No results for input(s): "HGBA1C" in the last 72 hours. CBG: Recent Labs   Lab 11/01/21 1131 11/01/21 2137 11/02/21 1224 11/02/21 1605 11/02/21 2037  GLUCAP 84 132* 249* 211* 278*   Lipid Profile: No results for input(s): "CHOL", "HDL", "LDLCALC", "TRIG", "CHOLHDL", "LDLDIRECT" in the last 72 hours. Thyroid Function Tests: No results for input(s): "TSH", "T4TOTAL", "FREET4", "T3FREE", "THYROIDAB" in the last 72 hours. Anemia Panel: No results for input(s): "VITAMINB12", "FOLATE", "FERRITIN", "TIBC", "IRON", "RETICCTPCT" in the last 72 hours. Sepsis Labs: No results for input(s): "PROCALCITON", "LATICACIDVEN" in the last 168 hours.  Recent Results (from the past 240 hour(s))  Resp Panel by RT-PCR (Flu A&B, Covid) Anterior Nasal Swab     Status: None   Collection Time: 10/31/21  5:44 PM   Specimen: Anterior Nasal Swab  Result Value Ref Range Status   SARS Coronavirus 2 by RT PCR NEGATIVE NEGATIVE Final    Comment: (NOTE) SARS-CoV-2 target nucleic acids are NOT DETECTED.  The SARS-CoV-2 RNA is generally detectable in upper respiratory specimens during the acute phase of infection. The lowest concentration of SARS-CoV-2 viral copies this assay can detect is 138 copies/mL. A negative result does not preclude SARS-Cov-2 infection and should not be used as the sole basis for treatment or other patient management decisions. A negative result may occur with  improper specimen collection/handling, submission of specimen other than nasopharyngeal swab, presence of viral mutation(s) within the areas targeted by this assay, and inadequate number of viral copies(<138 copies/mL). A negative result must be combined with clinical observations, patient history, and epidemiological information. The expected result is Negative.  Fact Sheet for Patients:  EntrepreneurPulse.com.au  Fact Sheet for Healthcare Providers:  IncredibleEmployment.be  This test is no t yet approved or cleared by the [place] FDA and  has been  authorized for detection and/or diagnosis of SARS-CoV-2 by FDA under an Emergency Use Authorization (EUA). This EUA will remain  in effect (meaning this test can be used) for the duration of the COVID-19 declaration under Section 564(b)(1) of the Act, 21 U.S.C.section 360bbb-3(b)(1), unless the authorization is terminated  or revoked sooner.       Influenza A by PCR NEGATIVE NEGATIVE Final   Influenza B by PCR NEGATIVE NEGATIVE Final    Comment: (NOTE) The Xpert Xpress SARS-CoV-2/FLU/RSV plus assay is intended as an aid in the diagnosis of influenza from Nasopharyngeal swab specimens and should not be used as a sole basis for treatment. Nasal washings and aspirates are unacceptable for Xpert Xpress SARS-CoV-2/FLU/RSV testing.  Fact Sheet for Patients: EntrepreneurPulse.com.au  Fact Sheet for Healthcare Providers: IncredibleEmployment.be  This test is not yet approved or cleared by the [place] FDA and has been authorized for detection and/or diagnosis of SARS-CoV-2 by FDA under an Emergency Use Authorization (EUA). This EUA will remain in effect (meaning this test can be used) for the duration of the COVID-19 declaration under Section 564(b)(1) of  the Act, 21 U.S.C. section 360bbb-3(b)(1), unless the authorization is terminated or revoked.  Performed at [facility], [address], [place], [place] [address]          Radiology Studies: No results found.      Scheduled Meds:  acetaminophen  650 mg Oral Once   amLODipine  5 mg Oral Daily   insulin aspart  0-5 Units Subcutaneous QHS   insulin aspart  0-9 Units Subcutaneous TID WC   insulin glargine-yfgn  6 Units Subcutaneous QHS   lipase/protease/amylase  72,000 Units Oral TID WC   multivitamin with minerals  1 tablet Oral Once   nicotine  21 mg Transdermal Daily   ondansetron (ZOFRAN) IV  4 mg Intravenous Once   pantoprazole  40 mg Oral Daily   rivaroxaban  20  mg Oral Q supper   sodium chloride flush  3 mL Intravenous Q12H   Continuous Infusions:  0.9 % NaCl with KCl 20 mEq / L       LOS: 2 days    Time spent: 35 mins     Wyvonnia Dusky, MD [facility] Pager [phone]  If 7PM-7AM, please contact night-coverage [email or web address] 11/03/2021, 7:38 AM

## 2021-11-03 NOTE — Plan of Care (Signed)
  Problem: Coping: Goal: Ability to adjust to condition or change in health will improve Outcome: Progressing   Problem: Fluid Volume: Goal: Ability to maintain a balanced intake and output will improve Outcome: Progressing   Problem: Health Behavior/Discharge Planning: Goal: Ability to identify and utilize available resources and services will improve Outcome: Progressing   Problem: Health Behavior/Discharge Planning: Goal: Ability to manage health-related needs will improve Outcome: Progressing

## 2021-11-03 NOTE — Progress Notes (Signed)
Patient discharged to [facility] with sitter, escorted by security.

## 2021-11-03 NOTE — Inpatient Diabetes Management (Signed)
Inpatient Diabetes Program Recommendations  AACE/ADA: New Consensus Statement on Inpatient Glycemic Control   Target Ranges:  Prepandial:   less than 140 mg/dL      Peak postprandial:   less than 180 mg/dL (1-2 hours)      Critically ill patients:  140 - 180 mg/dL    Latest Reference Range & Units 11/02/21 12:24 11/02/21 16:05 11/02/21 20:37 11/03/21 09:13  Glucose-Capillary 70 - 99 mg/dL 249 (H) 211 (H) 278 (H) 209 (H)   Review of Glycemic Control  Diabetes history: DM2 Outpatient Diabetes medications: Levemir 6 units daily Current orders for Inpatient glycemic control: Semglee 6 units QHS, Novolog 0-9 units TID with meals, Novolog 0-5 units QHS  Inpatient Diabetes Program Recommendations:    Insulin: Semglee NOT GIVEN on 11/02/21 (per MAR, medication not available). Please consider changing frequency of Semglee to 6 units daily (to start now) and adding Novolog 3 units TID with meals for meal coverage if patient eats at least 50% of meals.   Thanks, Barnie Alderman, RN, MSN, [place] Diabetes Coordinator Inpatient Diabetes Program [phone] (Team Pager from 8am to [place])

## 2021-11-03 NOTE — Progress Notes (Signed)
Admissions note: Pt states that she wants her family to be involved and care about her. She stated that she told her family, "I am sure you would care if I was suicidal." Pt stated she then went to go to her boyfriends house and he would not let her in so when she got back home, her mom and the police were there because of the statement she made. Pt states that she is not suicidal, she just wanted her family to care and check up on her. Pt has a 55yrold daughter in college and a 230yrld son. Pt states she is all alone now and in pain so she wants someone around. Pt states her bf uses he but she lets him just to have someone around. 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 Pt started to cry a few times during the assessment. Pt just wants her family to understand. Pt is concerned about her house and dog while she has been gone being in the hospital. Consents signed, handbook detailing the patient's rights, responsibilities, and visitor guidelines provided. Skin/belongings search completed and patient oriented to unit. Patient stable at this time. Patient given the opportunity to express concerns and ask questions. Patient given toiletries. RN will continue to monitor.  Problem: Education: Goal: Knowledge of General Education information will improve Description: Including pain rating scale, medication(s)/side effects and non-pharmacologic comfort measures Outcome: Progressing   Problem: Health Behavior/Discharge Planning: Goal: Ability to manage health-related needs will improve Outcome: Progressing    11/03/21 1550  Psych Admission Type (Psych Patients Only)  Admission Status Involuntary  Psychosocial Assessment  Patient Complaints Agitation;Sadness  Eye Contact Fair  Facial Expression Sad;Anxious  Affect Sad  Speech Logical/coherent  Interaction Assertive  Motor Activity Slow   Appearance/Hygiene Unremarkable;In scrubs  Behavior Characteristics Cooperative;Appropriate to situation;Anxious  Mood Anxious;Sad;Pleasant  Thought Process  Coherency Circumstantial  Content Blaming others  Delusions None reported or observed  Perception WDL  Hallucination None reported or observed  Judgment WDL  Confusion None  Danger to Self  Current suicidal ideation? Denies  Danger to Others  Danger to Others None reported or observed

## 2021-11-03 NOTE — Progress Notes (Signed)
Pain medicine - morphine - given as ordered.  Patient refused taking other meds.  Dr. Jimmye Norman aware.

## 2021-11-03 NOTE — Progress Notes (Signed)
Patient called for morphine for pain.  Explained to patient that there is no morphine order for pain.  Percocet is ordered but not available until 0930.  Patient states "Ok, let me go in the shower...". Sitter at bedside, RN in the room as well.  Patient refused to participate in care, refusing BG check, she states "I am not stepping out of the shower until I get Morphine.  The percocet does not help." Dr. Jimmye Norman made aware.

## 2021-11-03 NOTE — Tx Team (Signed)
Initial Treatment Plan 11/03/2021 5:20 PM Amy Wolfe [id number]:[id number]    PATIENT STRESSORS: Financial difficulties   Marital or family conflict     PATIENT STRENGTHS: Capable of independent living  Work skills    PATIENT IDENTIFIED PROBLEMS: Money   Family                   DISCHARGE CRITERIA:  Improved stabilization in mood, thinking, and/or behavior Verbal commitment to aftercare and medication compliance  PRELIMINARY DISCHARGE PLAN: Return to previous living arrangement  PATIENT/FAMILY INVOLVEMENT: This treatment plan has been presented to and reviewed with the patient, Amy Wolfe.  The patient and family have been given the opportunity to ask questions and make suggestions.  Gerrianne Scale, RN 11/03/2021, 5:20 PM

## 2021-11-04 DIAGNOSIS — F332 Major depressive disorder, recurrent severe without psychotic features: Secondary | ICD-10-CM | POA: Diagnosis not present

## 2021-11-04 LAB — GLUCOSE, CAPILLARY
Glucose-Capillary: 246 mg/dL — ABNORMAL HIGH (ref 70–99)
Glucose-Capillary: 107 mg/dL — ABNORMAL HIGH (ref 70–99)
Glucose-Capillary: 197 mg/dL — ABNORMAL HIGH (ref 70–99)
Glucose-Capillary: 230 mg/dL — ABNORMAL HIGH (ref 70–99)

## 2021-11-04 LAB — HEMOGLOBIN A1C
Hgb A1c MFr Bld: 7.7 % — ABNORMAL HIGH (ref 4.8–5.6)
Mean Plasma Glucose: 174.29 mg/dL

## 2021-11-04 LAB — TSH: TSH: 2.446 u[IU]/mL (ref 0.350–4.500)

## 2021-11-04 MED ORDER — ENSURE MAX PROTEIN PO LIQD
11.0000 [oz_av] | Freq: Every day | ORAL | Status: DC
  Filled 2021-11-04: qty 330

## 2021-11-04 MED ORDER — MIRTAZAPINE 15 MG PO TBDP
15.0000 mg | ORAL_TABLET | Freq: Every day | ORAL | Status: DC
  Administered 2021-11-04 – 2021-11-05 (×2): 15 mg via ORAL
  Filled 2021-11-04 (×4): qty 1

## 2021-11-04 MED ORDER — GLUCERNA SHAKE PO LIQD
237.0000 mL | Freq: Two times a day (BID) | ORAL | Status: DC
  Administered 2021-11-04 (×2): 237 mL via ORAL

## 2021-11-04 MED ORDER — ONDANSETRON 4 MG PO TBDP
4.0000 mg | ORAL_TABLET | Freq: Four times a day (QID) | ORAL | Status: DC | PRN
  Administered 2021-11-04 – 2021-11-07 (×6): 4 mg via ORAL
  Filled 2021-11-04 (×6): qty 1

## 2021-11-04 MED ORDER — ADULT MULTIVITAMIN W/MINERALS CH
1.0000 | ORAL_TABLET | Freq: Every day | ORAL | Status: DC
  Administered 2021-11-04 – 2021-11-07 (×4): 1 via ORAL
  Filled 2021-11-04 (×4): qty 1

## 2021-11-04 NOTE — [facility] Group Notes (Signed)
[place] Group Notes:  (Nursing/MHT/Case Management/Adjunct)  Date:  11/04/2021  Time:  8:34 PM  Type of Therapy:   Wrap up  Participation Level:  Active  Participation Quality:  Appropriate  Affect:  Appropriate  Cognitive:  Alert  Insight:  Good  Engagement in Group:  Engaged and she ask not to be woke up at 6am to get vital because she be in pain because her medicine is not due.  Modes of Intervention:  Support  Summary of Progress/Problems:  Amy Wolfe 11/04/2021, 8:34 PM

## 2021-11-04 NOTE — Inpatient Diabetes Management (Signed)
Inpatient Diabetes Program Recommendations  AACE/ADA: New Consensus Statement on Inpatient Glycemic Control   Target Ranges:  Prepandial:   less than 140 mg/dL      Peak postprandial:   less than 180 mg/dL (1-2 hours)      Critically ill patients:  140 - 180 mg/dL    Latest Reference Range & Units 11/03/21 09:13 11/03/21 12:38 11/03/21 17:23 11/03/21 22:01  Glucose-Capillary 70 - 99 mg/dL 209 (H) 91 340 (H) 115 (H)    Review of Glycemic Control  Diabetes history: DM2 Outpatient Diabetes medications: Levemir 6 units daily Current orders for Inpatient glycemic control: Semglee 6 units QHS, Novolog 0-9 units TID with meals, Novolog 0-5 units QHS   Inpatient Diabetes Program Recommendations:     Insulin: Semglee NOT GIVEN on 11/03/21 (per MAR, held due to CBG of 91).  Basal insulin should not be held without a provider order. Semglee is now ordered to be given at 8am today. Please administer Semglee as ordered.  Thanks, Barnie Alderman, RN, MSN, [place] Diabetes Coordinator Inpatient Diabetes Program [phone] (Team Pager from 8am to [place])

## 2021-11-04 NOTE — BH IP Treatment Plan (Signed)
Interdisciplinary Treatment and Diagnostic Plan Update  11/04/2021 Time of Session: 09:34 Amy Wolfe MRN: [id number]  Principal Diagnosis: Major depressive disorder, recurrent severe without psychotic features ([place])  Secondary Diagnoses: Principal Problem:   Major depressive disorder, recurrent severe without psychotic features ([place])   Current Medications:  Current Facility-Administered Medications  Medication Dose Route Frequency Provider Last Rate Last Admin   acetaminophen (TYLENOL) tablet 650 mg  650 mg Oral Q6H PRN Patrecia Pour, NP       alum & mag hydroxide-simeth (MAALOX/MYLANTA) 200-200-20 MG/5ML suspension 30 mL  30 mL Oral Q4H PRN Patrecia Pour, NP       amLODipine (NORVASC) tablet 5 mg  5 mg Oral Daily Patrecia Pour, NP   5 mg at 11/04/21 0750   feeding supplement (GLUCERNA SHAKE) (GLUCERNA SHAKE) liquid 237 mL  237 mL Oral BID BM Parks Ranger, DO   237 mL at 11/04/21 1030   insulin aspart (novoLOG) injection 0-5 Units  0-5 Units Subcutaneous QHS Patrecia Pour, NP       insulin aspart (novoLOG) injection 0-9 Units  0-9 Units Subcutaneous TID WC Patrecia Pour, NP   3 Units at 11/04/21 0752   insulin aspart (novoLOG) injection 3 Units  3 Units Subcutaneous TID WC Patrecia Pour, NP   3 Units at 11/04/21 0753   insulin glargine-yfgn (SEMGLEE) injection 6 Units  6 Units Subcutaneous Daily Patrecia Pour, NP   6 Units at 11/04/21 0753   lipase/protease/amylase (CREON) capsule 36,000 Units  36,000 Units Oral BID PRN Patrecia Pour, NP   36,000 Units at 11/03/21 2118   lipase/protease/amylase (CREON) capsule 72,000 Units  72,000 Units Oral TID WC Patrecia Pour, NP   72,000 Units at 11/04/21 0749   magnesium hydroxide (MILK OF MAGNESIA) suspension 30 mL  30 mL Oral Daily PRN Patrecia Pour, NP       multivitamin with minerals tablet 1 tablet  1 tablet Oral Daily Parks Ranger, DO       nicotine (NICODERM CQ - dosed in mg/24 hours) patch 21  mg  21 mg Transdermal Daily Patrecia Pour, NP       ondansetron [facility]) tablet 4 mg  4 mg Oral Q6H PRN Patrecia Pour, NP   4 mg at 11/03/21 2118   Or   ondansetron (ZOFRAN) injection 4 mg  4 mg Intravenous Q6H PRN Patrecia Pour, NP       oxyCODONE-acetaminophen (PERCOCET/ROXICET) 5-325 MG per tablet 1-2 tablet  1-2 tablet Oral Q6H PRN Patrecia Pour, NP   1 tablet at 11/04/21 0615   pantoprazole (PROTONIX) EC tablet 40 mg  40 mg Oral Daily Patrecia Pour, NP   40 mg at 11/04/21 0750   protein supplement (ENSURE MAX) liquid  11 oz Oral QHS Parks Ranger, DO       rivaroxaban Al[facility]) tablet 20 mg  20 mg Oral Q supper Patrecia Pour, NP   20 mg at 11/03/21 1735   silver sulfADIAZINE (SILVADENE) 1 % cream   Topical BID Patrecia Pour, NP   Given at 11/04/21 0755   PTA Medications: Medications Prior to Admission  Medication Sig Dispense Refill Last Dose   Accu-Chek Softclix Lancets lancets USE TO CHECK BLOOD SUGAR UP TO 4 TIMES DAILY AS DIRECTED 100 each 2    acetaminophen (TYLENOL) 500 MG tablet Take 2 tablets (1,000 mg total) by mouth every 6 (six) hours as needed for  mild pain, fever or headache (or Fever >/= 101). DO Not Take if taking the Percocet's as well 30 tablet 0    amLODipine (NORVASC) 5 MG tablet Take 1 tablet (5 mg total) by mouth daily. 30 tablet 0    blood glucose meter kit and supplies KIT Dispense based on patient and insurance preference. Use up to four times daily as directed. 1 each 0    blood glucose meter kit and supplies Dispense based on patient and insurance preference. Use up to four times daily as directed. (FOR ICD-10 E10.9, E11.9). 1 each 0    glucose blood (ACCU-CHEK GUIDE) test strip USE TO CHECK BLOOD SUGAR UP TO 4 TIMES DAILY AS DIRECTED 100 each 2    insulin detemir (LEVEMIR FLEXPEN) 100 UNIT/ML FlexPen Inject 6 Units into the skin daily. 15 mL 0    Insulin Pen Needle (PEN NEEDLES) 30G X 8 MM MISC 1 each by Does not apply route daily. 90 each 3     Insulin Pen Needle 31G X 5 MM MISC use with levemir flexpen daily 100 each 3    lipase/protease/amylase (CREON) 36000 UNITS CPEP capsule Take 1-2 capsules (36,000-72,000 Units total) by mouth See admin instructions. Take 2 capsules (72000u) by mouth three times daily with meals and take 1 capsule (36000u) by mouth twice daily with snacks 240 capsule 6    Multiple Vitamins-Minerals (MULTIVITAMIN ADULT) CHEW Chew 1 each by mouth daily.      nicotine (NICODERM CQ - DOSED IN MG/24 HOURS) 21 mg/24hr patch Place 1 patch (21 mg total) onto the skin daily. (Patient not taking: Reported on 09/02/2021) 28 patch 0    ondansetron (ZOFRAN) 8 MG tablet Take 1 tablet (8 mg total) by mouth every 8 (eight) hours as needed for nausea or vomiting. 30 tablet 6    oxyCODONE-acetaminophen (PERCOCET/ROXICET) 5-325 MG tablet Take 1-2 tablets by mouth every 6 (six) hours as needed for moderate pain or severe pain. 30 tablet 0    pantoprazole (PROTONIX) 40 MG tablet Take 1 tablet (40 mg total) by mouth 2 (two) times daily before a meal. 60 tablet 6    rivaroxaban (XARELTO) 20 MG TABS tablet Take 1 tablet (20 mg total) by mouth daily with supper. 90 tablet 1     Patient Stressors: Financial difficulties   Marital or family conflict    Patient Strengths: Capable of independent living  Work skills   Treatment Modalities: Medication Management, Group therapy, Case management,  1 to 1 session with clinician, Psychoeducation, Recreational therapy.   Physician Treatment Plan for Primary Diagnosis: Major depressive disorder, recurrent severe without psychotic features ([place]) Long Term Goal(s):     Short Term Goals:    Medication Management: Evaluate patient's response, side effects, and tolerance of medication regimen.  Therapeutic Interventions: 1 to 1 sessions, Unit Group sessions and Medication administration.  Evaluation of Outcomes: Not Met  Physician Treatment Plan for Secondary Diagnosis: Principal Problem:    Major depressive disorder, recurrent severe without psychotic features ([place])  Long Term Goal(s):     Short Term Goals:       Medication Management: Evaluate patient's response, side effects, and tolerance of medication regimen.  Therapeutic Interventions: 1 to 1 sessions, Unit Group sessions and Medication administration.  Evaluation of Outcomes: Not Met   RN Treatment Plan for Primary Diagnosis: Major depressive disorder, recurrent severe without psychotic features ([place]) Long Term Goal(s): Knowledge of disease and therapeutic regimen to maintain health will improve  Short Term Goals: Ability  to remain free from injury will improve, Ability to verbalize frustration and anger appropriately will improve, Ability to demonstrate self-control, Ability to participate in decision making will improve, Ability to verbalize feelings will improve, Ability to disclose and discuss suicidal ideas, Ability to identify and develop effective coping behaviors will improve, and Compliance with prescribed medications will improve  Medication Management: RN will administer medications as ordered by provider, will assess and evaluate patient's response and provide education to patient for prescribed medication. RN will report any adverse and/or side effects to prescribing provider.  Therapeutic Interventions: 1 on 1 counseling sessions, Psychoeducation, Medication administration, Evaluate responses to treatment, Monitor vital signs and CBGs as ordered, Perform/monitor CIWA, COWS, AIMS and Fall Risk screenings as ordered, Perform wound care treatments as ordered.  Evaluation of Outcomes: Not Met   LCSW Treatment Plan for Primary Diagnosis: Major depressive disorder, recurrent severe without psychotic features ([place]) Long Term Goal(s): Safe transition to appropriate next level of care at discharge, Engage patient in therapeutic group addressing interpersonal concerns.  Short Term Goals: Engage patient in  aftercare planning with referrals and resources, Increase social support, Increase ability to appropriately verbalize feelings, Increase emotional regulation, Facilitate acceptance of mental health diagnosis and concerns, and Increase skills for wellness and recovery  Therapeutic Interventions: Assess for all discharge needs, 1 to 1 time with Social worker, Explore available resources and support systems, Assess for adequacy in community support network, Educate family and significant other(s) on suicide prevention, Complete Psychosocial Assessment, Interpersonal group therapy.  Evaluation of Outcomes: Not Met   Progress in Treatment: Attending groups: No. Participating in groups: No. Taking medication as prescribed: Yes. Toleration medication: Yes. Family/Significant other contact made: No, will contact:  if given permission.  Patient understands diagnosis: No. Discussing patient identified problems/goals with staff: Yes. Medical problems stabilized or resolved: Yes. Denies suicidal/homicidal ideation: Yes. Issues/concerns per patient self-inventory: No. Other: none.  New problem(s) identified: No, Describe:  none identified.   New Short Term/Long Term Goal(s): medication management for mood stabilization; elimination of SI thoughts; development of comprehensive mental wellness/sobriety plan.  Patient Goals:  "I don't even know why I got here in the first place. I just want to go home to see my dog, talk to my daughter, and try to get my job back."  Discharge Plan or Barriers: CSW will assist pt with development of an appropriate aftercare/discharge plan.   Reason for Continuation of Hospitalization: Depression Medication stabilization Suicidal ideation  Estimated Length of Stay: 1-7 days  Last 3 [place] Suicide Severity Risk Score: Flowsheet Row Admission (Current) from 11/03/2021 in [place] ED to Hosp-Admission (Discharged) from 10/31/2021 in [place] ED from 10/16/2021 in [name] No Risk High Risk No Risk       Last PHQ 2/9 Scores:    09/02/2021   10:42 AM 03/28/2021   11:29 AM 03/03/2021    3:10 PM  Depression screen PHQ 2/9  Decreased Interest 0 0 0  Down, Depressed, Hopeless 0 0 0  PHQ - 2 Score 0 0 0  Altered sleeping 0 0 0  Tired, decreased energy 0 0 0  Change in appetite 0 0 0  Feeling bad or failure about yourself  0 0 0  Trouble concentrating 0 0 0  Moving slowly or fidgety/restless 0 0 0  Suicidal thoughts 0 0 0  PHQ-9 Score 0 0 0  Difficult doing work/chores Not difficult  at all  Not difficult at all    Scribe for Treatment Team: Shirl Harris, LCSW 11/04/2021 10:42 AM

## 2021-11-04 NOTE — Progress Notes (Signed)
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, Work, Other (Comment) Barista, Health)  Coping Skills:   Music, Education officer, community, Other (Comment), Read (Work)  Leisure Interests (2+):  Exercise - Walking, Individual - Reading, Music - Listen (Time with dog)  Frequency of Recreation/Participation: Weekly  Awareness of Community Resources:  Yes  Community Resources:  Library, Social worker, [employer], Engineer, drilling, [employer]  Current Use: No  If no, Barriers?: Transportation  Expressed Interest in [place]: Yes  [place] of Residence:  Insurance underwriter  Patient Main Form of Transportation: Walk  Patient Strengths:  Caring for others  Patient Identified Areas of Improvement:  Put myself first  Patient Goal for Hospitalization:  Love myself more  Current SI (including self-harm):  No  Current HI:  No  Current AVH: No  Staff Intervention Plan: Group Attendance, Collaborate with Interdisciplinary Treatment Team  Consent to Intern Participation: N/A  Bernarda Erck 11/04/2021, 2:44 PM

## 2021-11-04 NOTE — [facility] Suicide Risk Assessment (Signed)
[facility] Admission Suicide Risk Assessment   Nursing information obtained from:    Demographic factors:  Living alone, Unemployed Current Mental Status:  Suicidal ideation indicated by others Loss Factors:  Financial problems / change in socioeconomic status Historical Factors:  NA Risk Reduction Factors:  Positive therapeutic relationship  Total Time spent with patient: 1 hour Principal Problem: Major depressive disorder, recurrent severe without psychotic features ([place]) Diagnosis:  Principal Problem:   Major depressive disorder, recurrent severe without psychotic features ([place]) Active Problems:   Essential hypertension   Pancreatitis, chronic (HCC)   Portal vein thrombosis   Anticoagulated   Intractable vomiting with nausea   Diabetes mellitus due to underlying condition with hyperglycemia, with long-term current use of insulin (HCC)   Chronic pain syndrome   Alcohol-induced chronic pancreatitis ([place])   Alcohol abuse  Subjective Data: Patient seen and chart reviewed.  46 year old woman with a history of depression and alcohol abuse.  Petition by police who found her pounding on the door of a man acting bizarrely tearful agitated.  It was reported that she had made suicidal statements talking about how she wished she was dead.  On interview today the patient says she feels sad and negative down all the time.  She has been having frequent crying spells.  She talks at length about believing that her family does not love her and that no one is doing anything to help her.  Denies current suicidal thoughts.  Admits however having made the comments previously.  Denies psychotic symptoms.  Patient has multiple severe medical problems including chronic pancreatitis that was recently exacerbated by alcohol she was drinking the night that she came into the hospital.  Some history obtained from her mother as well that the patient has been functioning poorly for months or more with poor coping and poor care  for her health.  Family has been very concerned and feels they have been trying to help her and the patient has been pushing them away.  Patient was transiently admitted to medical service for management of pancreatitis before transfer to psychiatry.  Continued Clinical Symptoms:  Alcohol Use Disorder Identification Test Final Score (AUDIT): 1 The "Alcohol Use Disorders Identification Test", Guidelines for Use in Primary Care, Second Edition.  World [employer] [facility] Score between 0-7:  no or low risk or alcohol related problems. Score between 8-15:  moderate risk of alcohol related problems. Score between 16-19:  high risk of alcohol related problems. Score 20 or above:  warrants further diagnostic evaluation for alcohol dependence and treatment.   CLINICAL FACTORS:   Depression:   Anhedonia Insomnia   Musculoskeletal: Strength & Muscle Tone: within normal limits Gait & Station: normal Patient leans: N/A  Psychiatric Specialty Exam:  Presentation  General Appearance: No data recorded Eye Contact:No data recorded Speech:No data recorded Speech Volume:No data recorded Handedness:No data recorded  Mood and Affect  Mood:No data recorded Affect:No data recorded  Thought Process  Thought Processes:No data recorded Descriptions of Associations:No data recorded Orientation:No data recorded Thought Content:No data recorded History of Schizophrenia/Schizoaffective disorder:No data recorded Duration of Psychotic Symptoms:No data recorded Hallucinations:No data recorded Ideas of Reference:No data recorded Suicidal Thoughts:No data recorded Homicidal Thoughts:No data recorded  Sensorium  Memory:No data recorded Judgment:No data recorded Insight:No data recorded  Executive Functions  Concentration:No data recorded Attention Span:No data recorded Recall:No data recorded Fund of Knowledge:No data recorded Language:No data recorded  Psychomotor Activity   Psychomotor Activity:No data recorded  Assets  Assets:No data recorded  Sleep  Sleep:No data recorded   Physical Exam: Physical Exam Vitals and nursing note reviewed.  Constitutional:      Appearance: Normal appearance.  HENT:     Head: Normocephalic and atraumatic.     Mouth/Throat:     Pharynx: Oropharynx is clear.  Eyes:     Pupils: Pupils are equal, round, and reactive to light.  Cardiovascular:     Rate and Rhythm: Normal rate and regular rhythm.  Pulmonary:     Effort: Pulmonary effort is normal.     Breath sounds: Normal breath sounds.  Abdominal:     General: Abdomen is flat.     Palpations: Abdomen is soft.  Musculoskeletal:        General: Normal range of motion.  Skin:    General: Skin is warm and dry.  Neurological:     General: No focal deficit present.     Mental Status: She is alert. Mental status is at baseline.  Psychiatric:        Attention and Perception: She is inattentive.        Mood and Affect: Mood is anxious and depressed. Affect is tearful.        Speech: Speech is tangential.        Behavior: Behavior is agitated. Behavior is not aggressive.        Thought Content: Thought content includes suicidal ideation. Thought content does not include suicidal plan.        Cognition and Memory: Memory is impaired.        Judgment: Judgment is impulsive and inappropriate.    Review of Systems  Constitutional: Negative.   HENT: Negative.    Eyes: Negative.   Respiratory: Negative.    Cardiovascular: Negative.   Gastrointestinal:  Positive for abdominal pain.  Musculoskeletal: Negative.   Skin: Negative.   Neurological: Negative.   Psychiatric/Behavioral:  Positive for depression, substance abuse and suicidal ideas. The patient is nervous/anxious and has insomnia.    Blood pressure (!) 133/98, pulse 84, temperature 97.7 F (36.5 C), temperature source Oral, resp. rate 18, height '5\' 7"'$  (1.702 m), weight 54.9 kg, last menstrual period 09/30/2018,  SpO2 98 %. Body mass index is 18.95 kg/m.   COGNITIVE FEATURES THAT CONTRIBUTE TO RISK:  Thought constriction (tunnel vision)    SUICIDE RISK:   Mild:  Suicidal ideation of limited frequency, intensity, duration, and specificity.  There are no identifiable plans, no associated intent, mild dysphoria and related symptoms, good self-control (both objective and subjective assessment), few other risk factors, and identifiable protective factors, including available and accessible social support.  PLAN OF CARE: Continue 15-minute checks.  Continue inpatient hospitalization.  Treatment for depression.  Ongoing assessment of dangerousness prior to discharge  I certify that inpatient services furnished can reasonably be expected to improve the patient's condition.   [name], MD 11/04/2021, 3:54 PM

## 2021-11-04 NOTE — H&P (Signed)
Psychiatric Admission Assessment Adult  Patient Identification: Amy Wolfe MRN:  [id number] Date of Evaluation:  11/04/2021 Chief Complaint:  Major depressive disorder, recurrent severe without psychotic features ([place]) [F33.2] Principal Diagnosis: Major depressive disorder, recurrent severe without psychotic features ([place]) Diagnosis:  Principal Problem:   Major depressive disorder, recurrent severe without psychotic features ([place]) Active Problems:   Essential hypertension   Pancreatitis, chronic ([place])   Portal vein thrombosis   Anticoagulated   Intractable vomiting with nausea   Diabetes mellitus due to underlying condition with hyperglycemia, with long-term current use of insulin (HCC)   Chronic pain syndrome   Alcohol-induced chronic pancreatitis ([place])   Alcohol abuse  History of Present Illness: Patient seen and chart reviewed.  46 year old woman brought to the hospital under IVC filed by Event organiser.  She was found agitated confused pounding on a man's door in emotional distress made suicidal statements.  Patient was intoxicated when she came in.  She claims this was the first alcohol she had consumed in 3 years.  Even after sobering up however the patient is tearful and distraught and says she has been depressed for months or longer.  Sad all the time.  Constantly feeling overwhelmed.  Poor appetite and poor sleep.  Patient has diabetes and pancreatitis and hypertension and admits to probably suboptimal control of her illness.  She has chronic pain from her pancreatitis as well.  Patient says she feels like no one in her family cares about her.  Collateral from her family suggest that they have been trying to express care for her and do things to take care of her 4 months but the patient does not seem to see it that way.  Not currently receiving any outpatient psychiatric treatment.  Drug screen also positive for cocaine Associated Signs/Symptoms: Depression Symptoms:   depressed mood, anhedonia, insomnia, fatigue, feelings of worthlessness/guilt, difficulty concentrating, hopelessness, recurrent thoughts of death, anxiety, loss of energy/fatigue, weight loss, Duration of Depression Symptoms: Greater than two weeks  (Hypo) Manic Symptoms:  Impulsivity, Irritable Mood, Anxiety Symptoms:  Excessive Worry, Psychotic Symptoms:   None reported PTSD Symptoms: Negative Total Time spent with patient: 1 hour  Past Psychiatric History: Patient has had no previous hospitalizations.  Denies suicide attempts.  Long history of alcohol abuse culminating in chronic pancreatitis.  Is the patient at risk to self? Yes.    Has the patient been a risk to self in the past 6 months? Yes.    Has the patient been a risk to self within the distant past? No.  Is the patient a risk to others? No.  Has the patient been a risk to others in the past 6 months? No.  Has the patient been a risk to others within the distant past? No.   [place] Scale:  [place] Admission (Current) from 11/03/2021 in [place] ED to Hosp-Admission (Discharged) from 10/31/2021 in [facility] ED from 10/16/2021 in [place] No Risk High Risk No Risk        Prior Inpatient Therapy:   Prior Outpatient Therapy:    Alcohol Screening: 1. How often do you have a drink containing alcohol?: Monthly or less 2. How many drinks containing alcohol do you have on a typical day when you are drinking?: 1 or 2 3. How often do you have six or more drinks on one occasion?: Never AUDIT-C Score: 1 4. How often during the last  year have you found that you were not able to stop drinking once you had started?: Never 5. How often during the last year have you failed to do what was normally expected from you because of drinking?: Never 6. How often during the last year have you  needed a first drink in the morning to get yourself going after a heavy drinking session?: Never 7. How often during the last year have you had a feeling of guilt of remorse after drinking?: Never 8. How often during the last year have you been unable to remember what happened the night before because you had been drinking?: Never 9. Have you or someone else been injured as a result of your drinking?: No 10. Has a relative or friend or a doctor or another health worker been concerned about your drinking or suggested you cut down?: No Alcohol Use Disorder Identification Test Final Score (AUDIT): 1 Substance Abuse History in the last 12 months:  Yes.   Consequences of Substance Abuse: Worsening of pancreatitis and chronic pain Previous Psychotropic Medications: No  Psychological Evaluations: No  Past Medical History:  Past Medical History:  Diagnosis Date   Abdominal pain    Anxiety    Coronary artery disease    Diabetes mellitus without complication (HCC)    Dyspnea    GERD (gastroesophageal reflux disease)    H/O blood clots    Hypertension    Liver abscess    Pancreatitis 2019   PONV (postoperative nausea and vomiting) 11/07/2018   Thyroid disease    Uterine fibroid     Past Surgical History:  Procedure Laterality Date   BILIARY BRUSHING  08/22/2021   Procedure: BILIARY BRUSHING;  Surgeon: Irving Copas., MD;  Location: [facility];  Service: Gastroenterology;;   BIOPSY  06/01/2021   Procedure: BIOPSY;  Surgeon: Irving Copas., MD;  Location: [facility];  Service: Gastroenterology;;   CESAREAN SECTION     EMBOLIZATION N/A 06/14/2018   Procedure: Uterine Embolization;  Surgeon: Algernon Huxley, MD;  Location: [facility];  Service: Cardiovascular;  Laterality: N/A;   ENDOSCOPIC RETROGRADE CHOLANGIOPANCREATOGRAPHY (ERCP) WITH PROPOFOL N/A 08/22/2021   Procedure: ENDOSCOPIC RETROGRADE CHOLANGIOPANCREATOGRAPHY (ERCP) WITH PROPOFOL;  Surgeon: Rush Landmark  Telford Nab., MD;  Location: [facility];  Service: Gastroenterology;  Laterality: N/A;   ESOPHAGOGASTRODUODENOSCOPY (EGD) WITH PROPOFOL N/A 06/01/2021   Procedure: ESOPHAGOGASTRODUODENOSCOPY (EGD) WITH PROPOFOL;  Surgeon: Rush Landmark Telford Nab., MD;  Location: [facility];  Service: Gastroenterology;  Laterality: N/A;   ESOPHAGOGASTRODUODENOSCOPY (EGD) WITH PROPOFOL N/A 08/22/2021   Procedure: ESOPHAGOGASTRODUODENOSCOPY (EGD) WITH PROPOFOL;  Surgeon: Rush Landmark Telford Nab., MD;  Location: [facility];  Service: Gastroenterology;  Laterality: N/A;   EUS N/A 06/01/2021   Procedure: UPPER ENDOSCOPIC ULTRASOUND (EUS) RADIAL;  Surgeon: Irving Copas., MD;  Location: [facility];  Service: Gastroenterology;  Laterality: N/A;   EUS N/A 08/22/2021   Procedure: ESOPHAGEAL ENDOSCOPIC ULTRASOUND (EUS) RADIAL;  Surgeon: Rush Landmark Telford Nab., MD;  Location: [facility];  Service: Gastroenterology;  Laterality: N/A;   laceration finger Right    LAPAROSCOPIC VAGINAL HYSTERECTOMY WITH SALPINGECTOMY Bilateral 10/28/2018   Procedure: LAPAROSCOPIC ASSISTED VAGINAL HYSTERECTOMY BILATERAL WITH SALPINGECTOMY;  Surgeon: Rubie Maid, MD;  Location: [facility];  Service: Gynecology;  Laterality: Bilateral;   NEUROLYTIC CELIAC PLEXUS  08/22/2021   Procedure: NEUROLYTIC CELIAC PLEXUS;  Surgeon: Rush Landmark Telford Nab., MD;  Location: [facility];  Service: Gastroenterology;;   PANCREATIC STENT PLACEMENT  08/22/2021   Procedure: PANCREATIC STENT PLACEMENT;  Surgeon: Irving Copas., MD;  Location: [facility];  Service: Gastroenterology;;   REMOVAL OF STONES  08/22/2021   Procedure: REMOVAL OF STONES;  Surgeon: Rush Landmark Telford Nab., MD;  Location: [facility];  Service: Gastroenterology;;   [name]  08/22/2021   Procedure: [name];  Surgeon: Rush Landmark Telford Nab., MD;  Location: [facility];  Service: Gastroenterology;;   THYROIDECTOMY, PARTIAL     VAGINAL HYSTERECTOMY Bilateral  10/28/2018   Procedure: HYSTERECTOMY VAGINAL WITH BILATERAL SALPINGECTOMY;  Surgeon: Rubie Maid, MD;  Location: [facility];  Service: Gynecology;  Laterality: Bilateral;   Family History:  Family History  Problem Relation Age of Onset   Hypertension Mother    Diabetes Mother    Brain cancer Father    Aneurysm Brother    Bone cancer Maternal Grandmother    Lung cancer Maternal Grandfather    Cancer Paternal Grandmother    Cancer Paternal Grandfather    Colon cancer Neg Hx    Esophageal cancer Neg Hx    Inflammatory bowel disease Neg Hx    Liver disease Neg Hx    Pancreatic cancer Neg Hx    Rectal cancer Neg Hx    Stomach cancer Neg Hx    Family Psychiatric  History: Family history positive for anxiety and multiple medical problems Tobacco Screening:   Social History:  Social History   Substance and Sexual Activity  Alcohol Use Not Currently     Social History   Substance and Sexual Activity  Drug Use Yes   Types: Marijuana   Comment: daily    Additional Social History:                           Allergies:  No Known Allergies Lab Results:  Results for orders placed or performed during the hospital encounter of 11/03/21 (from the past 48 hour(s))  Glucose, capillary     Status: Abnormal   Collection Time: 11/03/21  5:23 PM  Result Value Ref Range   Glucose-Capillary 340 (H) 70 - 99 mg/dL    Comment: Glucose reference range applies only to samples taken after fasting for at least 8 hours.   Comment 1 Notify RN   Glucose, capillary     Status: Abnormal   Collection Time: 11/03/21 10:01 PM  Result Value Ref Range   Glucose-Capillary 115 (H) 70 - 99 mg/dL    Comment: Glucose reference range applies only to samples taken after fasting for at least 8 hours.  Glucose, capillary     Status: Abnormal   Collection Time: 11/04/21  7:48 AM  Result Value Ref Range   Glucose-Capillary 246 (H) 70 - 99 mg/dL    Comment: Glucose reference range applies only to  samples taken after fasting for at least 8 hours.  Glucose, capillary     Status: Abnormal   Collection Time: 11/04/21 11:22 AM  Result Value Ref Range   Glucose-Capillary 107 (H) 70 - 99 mg/dL    Comment: Glucose reference range applies only to samples taken after fasting for at least 8 hours.    Blood Alcohol level:  Lab Results  Component Value Date   ETH 203 (H) 10/31/2021   ETH <10 01/10/3233    Metabolic Disorder Labs:  Lab Results  Component Value Date   HGBA1C 6.7 (A) 09/02/2021   MPG 206 02/24/2021   MPG 157.07 02/05/2020   No results found for: "PROLACTIN" Lab Results  Component Value Date   TRIG 127.0 10/05/2020    Current Medications: Current Facility-Administered  Medications  Medication Dose Route Frequency Provider Last Rate Last Admin   acetaminophen (TYLENOL) tablet 650 mg  650 mg Oral Q6H PRN Patrecia Pour, NP       alum & mag hydroxide-simeth (MAALOX/MYLANTA) 200-200-20 MG/5ML suspension 30 mL  30 mL Oral Q4H PRN Patrecia Pour, NP       amLODipine (NORVASC) tablet 5 mg  5 mg Oral Daily Patrecia Pour, NP   5 mg at 11/04/21 0750   feeding supplement (GLUCERNA SHAKE) (GLUCERNA SHAKE) liquid 237 mL  237 mL Oral BID BM Parks Ranger, DO   237 mL at 11/04/21 1030   insulin aspart (novoLOG) injection 0-5 Units  0-5 Units Subcutaneous QHS Patrecia Pour, NP       insulin aspart (novoLOG) injection 0-9 Units  0-9 Units Subcutaneous TID WC Patrecia Pour, NP   3 Units at 11/04/21 0752   insulin aspart (novoLOG) injection 3 Units  3 Units Subcutaneous TID WC Patrecia Pour, NP   3 Units at 11/04/21 1128   insulin glargine-yfgn (SEMGLEE) injection 6 Units  6 Units Subcutaneous Daily Patrecia Pour, NP   6 Units at 11/04/21 0753   lipase/protease/amylase (CREON) capsule 36,000 Units  36,000 Units Oral BID PRN Patrecia Pour, NP   36,000 Units at 11/03/21 2118   lipase/protease/amylase (CREON) capsule 72,000 Units  72,000 Units Oral TID WC Patrecia Pour, NP   72,000 Units at 11/04/21 1126   magnesium hydroxide (MILK OF MAGNESIA) suspension 30 mL  30 mL Oral Daily PRN Patrecia Pour, NP       mirtazapine (REMERON SOL-TAB) disintegrating tablet 15 mg  15 mg Oral QHS Sonnie Pawloski, Madie Reno, MD       multivitamin with minerals tablet 1 tablet  1 tablet Oral Daily Parks Ranger, DO   1 tablet at 11/04/21 1129   nicotine (NICODERM CQ - dosed in mg/24 hours) patch 21 mg  21 mg Transdermal Daily Patrecia Pour, NP       ondansetron (ZOFRAN-ODT) disintegrating tablet 4 mg  4 mg Oral Q6H PRN Leanah Kolander, Madie Reno, MD       oxyCODONE-acetaminophen (PERCOCET/ROXICET) 5-325 MG per tablet 1-2 tablet  1-2 tablet Oral Q6H PRN Patrecia Pour, NP   2 tablet at 11/04/21 1118   pantoprazole (PROTONIX) EC tablet 40 mg  40 mg Oral Daily Patrecia Pour, NP   40 mg at 11/04/21 0750   protein supplement (ENSURE MAX) liquid  11 oz Oral QHS Parks Ranger, DO       rivaroxaban Alveda Reasons) tablet 20 mg  20 mg Oral Q supper Patrecia Pour, NP   20 mg at 11/03/21 1735   silver sulfADIAZINE (SILVADENE) 1 % cream   Topical BID Patrecia Pour, NP   Given at 11/04/21 0755   PTA Medications: Medications Prior to Admission  Medication Sig Dispense Refill Last Dose   Accu-Chek Softclix Lancets lancets USE TO CHECK BLOOD SUGAR UP TO 4 TIMES DAILY AS DIRECTED 100 each 2    acetaminophen (TYLENOL) 500 MG tablet Take 2 tablets (1,000 mg total) by mouth every 6 (six) hours as needed for mild pain, fever or headache (or Fever >/= 101). DO Not Take if taking the Percocet's as well 30 tablet 0    amLODipine (NORVASC) 5 MG tablet Take 1 tablet (5 mg total) by mouth daily. 30 tablet 0    blood glucose meter kit and supplies KIT Dispense based  on patient and insurance preference. Use up to four times daily as directed. 1 each 0    blood glucose meter kit and supplies Dispense based on patient and insurance preference. Use up to four times daily as directed. (FOR ICD-10  E10.9, E11.9). 1 each 0    glucose blood (ACCU-CHEK GUIDE) test strip USE TO CHECK BLOOD SUGAR UP TO 4 TIMES DAILY AS DIRECTED 100 each 2    insulin detemir (LEVEMIR FLEXPEN) 100 UNIT/ML FlexPen Inject 6 Units into the skin daily. 15 mL 0    Insulin Pen Needle (PEN NEEDLES) 30G X 8 MM MISC 1 each by Does not apply route daily. 90 each 3    Insulin Pen Needle 31G X 5 MM MISC use with levemir flexpen daily 100 each 3    lipase/protease/amylase (CREON) 36000 UNITS CPEP capsule Take 1-2 capsules (36,000-72,000 Units total) by mouth See admin instructions. Take 2 capsules (72000u) by mouth three times daily with meals and take 1 capsule (36000u) by mouth twice daily with snacks 240 capsule 6    Multiple Vitamins-Minerals (MULTIVITAMIN ADULT) CHEW Chew 1 each by mouth daily.      nicotine (NICODERM CQ - DOSED IN MG/24 HOURS) 21 mg/24hr patch Place 1 patch (21 mg total) onto the skin daily. (Patient not taking: Reported on 09/02/2021) 28 patch 0    ondansetron (ZOFRAN) 8 MG tablet Take 1 tablet (8 mg total) by mouth every 8 (eight) hours as needed for nausea or vomiting. 30 tablet 6    oxyCODONE-acetaminophen (PERCOCET/ROXICET) 5-325 MG tablet Take 1-2 tablets by mouth every 6 (six) hours as needed for moderate pain or severe pain. 30 tablet 0    pantoprazole (PROTONIX) 40 MG tablet Take 1 tablet (40 mg total) by mouth 2 (two) times daily before a meal. 60 tablet 6    rivaroxaban (XARELTO) 20 MG TABS tablet Take 1 tablet (20 mg total) by mouth daily with supper. 90 tablet 1     Musculoskeletal: Strength & Muscle Tone: within normal limits Gait & Station: normal Patient leans: N/A            Psychiatric Specialty Exam:  Presentation  General Appearance: No data recorded Eye Contact:No data recorded Speech:No data recorded Speech Volume:No data recorded Handedness:No data recorded  Mood and Affect  Mood:No data recorded Affect:No data recorded  Thought Process  Thought Processes:No  data recorded Duration of Psychotic Symptoms: No data recorded Past Diagnosis of Schizophrenia or Psychoactive disorder: No data recorded Descriptions of Associations:No data recorded Orientation:No data recorded Thought Content:No data recorded Hallucinations:No data recorded Ideas of Reference:No data recorded Suicidal Thoughts:No data recorded Homicidal Thoughts:No data recorded  Sensorium  Memory:No data recorded Judgment:No data recorded Insight:No data recorded  Executive Functions  Concentration:No data recorded Attention Span:No data recorded Recall:No data recorded Fund of Knowledge:No data recorded Language:No data recorded  Psychomotor Activity  Psychomotor Activity:No data recorded  Assets  Assets:No data recorded  Sleep  Sleep:No data recorded   Physical Exam: Physical Exam Vitals and nursing note reviewed.  Constitutional:      Appearance: Normal appearance. She is ill-appearing.  HENT:     Head: Normocephalic and atraumatic.     Mouth/Throat:     Pharynx: Oropharynx is clear.  Eyes:     Pupils: Pupils are equal, round, and reactive to light.  Cardiovascular:     Rate and Rhythm: Normal rate and regular rhythm.  Pulmonary:     Effort: Pulmonary effort is normal.  Breath sounds: Normal breath sounds.  Abdominal:     General: Abdomen is flat.     Palpations: Abdomen is soft.  Musculoskeletal:        General: Normal range of motion.  Skin:    General: Skin is warm and dry.  Neurological:     General: No focal deficit present.     Mental Status: She is alert. Mental status is at baseline.  Psychiatric:        Attention and Perception: She is inattentive.        Mood and Affect: Mood is anxious and depressed. Affect is tearful.        Speech: Speech is rapid and pressured.        Behavior: Behavior is agitated. Behavior is not aggressive.        Thought Content: Thought content includes suicidal ideation.        Cognition and Memory:  Cognition is impaired. Memory is impaired.        Judgment: Judgment is impulsive and inappropriate.    Review of Systems  Constitutional: Negative.   HENT: Negative.    Eyes: Negative.   Respiratory: Negative.    Cardiovascular: Negative.   Gastrointestinal:  Positive for abdominal pain.  Musculoskeletal: Negative.   Skin: Negative.   Neurological: Negative.   Psychiatric/Behavioral:  Positive for depression, substance abuse and suicidal ideas. The patient is nervous/anxious and has insomnia.    Blood pressure (!) 133/98, pulse 84, temperature 97.7 F (36.5 C), temperature source Oral, resp. rate 18, height _0  (1.702 m), weight 54.9 kg, last menstrual period 09/30/2018, SpO2 98 %. Body mass index is 18.95 kg/m.  Treatment Plan Summary: Medication management and Plan supportive counseling.  Met with treatment team as well.  Patient appears to be having a severe major depressive episode in the context of major life stresses.  Living by herself struggling to work.  Daughter just went off to college.  Patient is financially struggling.  Medical problems severe.  Health does not appear to be good.  Recommend treatment for depression starting with mirtazapine.  Review labs.  Include in individual and group therapy.  Psychoeducation and support around depression and alcohol abuse.  Ongoing assessment of dangerousness prior to discharge  Observation Level/Precautions:  15 minute checks  Laboratory:  Chemistry Profile  Psychotherapy:    Medications:    Consultations:    Discharge Concerns:    Estimated LOS:  Other:     Physician Treatment Plan for Primary Diagnosis: Major depressive disorder, recurrent severe without psychotic features ([place]) Long Term Goal(s): Improvement in symptoms so as ready for discharge  Short Term Goals: Ability to disclose and discuss suicidal ideas and Ability to demonstrate self-control will improve  Physician Treatment Plan for Secondary Diagnosis:  Principal Problem:   Major depressive disorder, recurrent severe without psychotic features ([place]) Active Problems:   Essential hypertension   Pancreatitis, chronic (HCC)   Portal vein thrombosis   Anticoagulated   Intractable vomiting with nausea   Diabetes mellitus due to underlying condition with hyperglycemia, with long-term current use of insulin (HCC)   Chronic pain syndrome   Alcohol-induced chronic pancreatitis ([place])   Alcohol abuse  Long Term Goal(s): Improvement in symptoms so as ready for discharge  Short Term Goals: Ability to maintain clinical measurements within normal limits will improve and Compliance with prescribed medications will improve  I certify that inpatient services furnished can reasonably be expected to improve the patient's condition.    [name], MD  11/3/20233:57 PM

## 2021-11-04 NOTE — Progress Notes (Signed)
Recreation Therapy Notes  INPATIENT RECREATION TR PLAN  Patient Details Name: Amy Wolfe MRN: [id number] DOB: 02-20-1975 Today's Date: 11/04/2021  Rec Therapy Plan Is patient appropriate for Therapeutic Recreation?: Yes Treatment times per week: at least 3 Estimated Length of Stay: 5-7 days TR Treatment/Interventions: Group participation (Comment)  Discharge Criteria Pt will be discharged from therapy if:: Discharged Treatment plan/goals/alternatives discussed and agreed upon by:: Patient/family  Discharge Summary     Amy Wolfe 11/04/2021, 2:44 PM

## 2021-11-04 NOTE — Progress Notes (Signed)
Patient has acclimated to the unit.  She has been in the dayroom with her peers.  She is med compliant and received her meds without incident.  Blood sugar registered 115 so no coverage was needed tonight.  She denies having any psychiatric symptoms at this encounter.  Encouraged her to seek staff with any questions or concerns that she may have. Q 15 minute safety rounds in place.   C Butler-Nicholson, LPN

## 2021-11-04 NOTE — Progress Notes (Signed)
Recreation Therapy Notes  Date: 11/04/2021  Time: 10:35am    Location: Craft room   Behavioral response: Appropriate  Intervention Topic:  Self-care    Discussion/Intervention:  Group content today was focused on Self-Care. The group defined self-care and some positive ways they care for themselves. Individuals expressed ways and reasons why they neglected any self-care in the past. Patients described ways to improve self-care in the future. The group explained what could happen if they did not do any self-care activities at all. The group participated in the intervention "self-care assessment" where they had a chance to discover some of their weaknesses and strengths in self- care. Patient came up with a self-care plan to improve themselves in the future.  .  Clinical Observations/Feedback: Patient came to group and defined self-care as taking care of mental health. She explained that she could improve her self-care skills by not looking for love in others. Individual was social with peers and staff while participating in the intervention.    Amy Wolfe LRT/CTRS         [name] 11/04/2021 1:20 PM

## 2021-11-04 NOTE — Progress Notes (Signed)
MHT notified this writer that patinet was complaining of being nauseous. Patient was given PRN Zofran, in which she tolerated it well, without any issues. However, she is requesting the orally disintegrating pill instead of the tablet, because she states that when she gets like this, it's hard for her to swallow. This Probation officer notified MD and patient's RN, via secure chat. Staff will continue to [facility] for safety.

## 2021-11-04 NOTE — Plan of Care (Signed)
  Problem: Nutrition: Goal: Adequate nutrition will be maintained Outcome: Progressing   Problem: Coping: Goal: Level of anxiety will decrease Outcome: Progressing   

## 2021-11-04 NOTE — Group Note (Signed)
[facility] LCSW Group Therapy Note   Group Date: 11/04/2021 Start Time: 0768 End Time: 1415  Type of Therapy and Topic:  Group Therapy:  Feelings around Relapse and Recovery  Participation Level:  Active    Description of Group:    Patients in this group will discuss emotions they experience before and after a relapse. They will process how experiencing these feelings, or avoidance of experiencing them, relates to having a relapse. Facilitator will guide patients to explore emotions they have related to recovery. Patients will be encouraged to process which emotions are more powerful. They will be guided to discuss the emotional reaction significant others in their lives may have to patients' relapse or recovery. Patients will be assisted in exploring ways to respond to the emotions of others without this contributing to a relapse.  Therapeutic Goals: Patient will identify two or more emotions that lead to relapse for them:  Patient will identify two emotions that result when they relapse:  Patient will identify two emotions related to recovery:  Patient will demonstrate ability to communicate their needs through discussion and/or role plays.   Summary of Patient Progress: Patient was present for the entirety of the group process. She defined relapse as going backwards, or hustling backwards. Pt admitted that she has issues with alcohol along with a family history of it as well. She stated that this is what she is trying to avoid returning to because she messed up her pancreas drinking so much. She identified loneliness as a risk factor for relapse for her. Peers and facilitators offered ideas of how to gain connection through things like therapy, support groups like AA/NA, and putting ourselves into situations that call for [place] to be social. Pt acknowledged that for her she will need to begin to love herself, stay away from alcohol, and potentially connect with therapy. She appeared open and receptive  to feedback/comments from both peers and facilitator. Pt presented with growth in insight compared to this morning's treatment team meeting.    Therapeutic Modalities:   Cognitive Behavioral Therapy Solution-Focused Therapy Assertiveness Training Relapse Prevention Therapy   Shirl Harris, LCSW

## 2021-11-04 NOTE — [facility] Group Notes (Signed)
[facility] Group Notes:  (Nursing/MHT/Case Management/Adjunct)  Date:  11/04/2021  Time:  10:22 AM  Type of Therapy:   Community Meeting  Participation Level:  Did Not Attend   Adela Lank [facility] 11/04/2021, 10:22 AM

## 2021-11-04 NOTE — Progress Notes (Signed)
Pt denies SI/HI/AVH and verbally agrees to approach staff if these become apparent or before harming themselves/others. Rates depression 0/10. Rates anxiety 0/10. Rates pain 7/10. Pt has gone to one group but has been in her room for most of the day. Pt goes to meals. Pt has gotten pain PRNs throughout the day. Pt takes her glucernas with her meals since that is when she takes her medications. Scheduled medications administered to pt, per MD orders. RN provided support and encouragement to pt. Q15 min safety checks implemented and continued. Pt safe on the unit. RN will continue to monitor and intervene as needed. Problem: Nutrition: Goal: Adequate nutrition will be maintained Outcome: Progressing   Problem: Coping: Goal: Level of anxiety will decrease Outcome: Progressing   11/04/21 0750  Psych Admission Type (Psych Patients Only)  Admission Status Involuntary  Psychosocial Assessment  Patient Complaints None  Eye Contact Fair  Facial Expression Worried  Affect Appropriate to circumstance  Speech Logical/coherent  Interaction Assertive  Motor Activity Slow  Appearance/Hygiene Unremarkable  Behavior Characteristics Cooperative;Appropriate to situation;Calm  Mood Anxious;Pleasant  Thought Process  Coherency Circumstantial  Content Blaming others  Delusions None reported or observed  Perception WDL  Hallucination None reported or observed  Judgment WDL  Confusion None  Danger to Self  Current suicidal ideation? Denies

## 2021-11-04 NOTE — Progress Notes (Signed)
NUTRITION ASSESSMENT  Pt identified as at risk on the Malnutrition Screen Tool  INTERVENTION:  -Glucerna Shake po BID, each supplement provides 220 kcal and 10 grams of protein  -Ensure Max po daily, each supplement provides 150 kcal and 30 grams of protein -MVI with minerals daily  NUTRITION DIAGNOSIS: Unintentional weight loss related to sub-optimal intake as evidenced by pt report.   Goal: Pt to meet >/= 90% of their estimated nutrition needs.  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  Per RN notes, pt has been compliant with meds since being admitted to unit.   Pt currently on a carb modified diet. No meal completion data available to assess at this time.   Reviewed wt hx; pt has experienced a 5.5% wt loss over the past 3 months, which is not significant for time frame. Pt is at high risk for malnutrition given lower range BMI and would benefit from addition of oral nutrition supplements.   Medications reviewed and include creon.   Lab Results  Component Value Date   HGBA1C 6.7 (A) 09/02/2021   PTA DM medications are 6 units insulin detemir daily.   Labs reviewed: K: 3.4, CBGS: 91-340 (inpatient orders for glycemic control are 0-9 units insulin as apart TID with meals, 0-5 units insulin aspart daily at bedtime, 3 units insulin aspart TID with meals, and 6 units insulin glargine-yfgn daily).    46 y.o. female  Height: Ht Readings from Last 1 Encounters:  11/03/21 '5\' 7"'$  (1.702 m)    Weight: Wt Readings from Last 1 Encounters:  11/03/21 54.9 kg    Weight Hx: Wt Readings from Last 10 Encounters:   11/03/21 54.9 kg  11/01/21 58.5 kg  10/16/21 59 kg  09/02/21 71.7 kg  08/22/21 56.2 kg  07/26/21 58.1 kg  07/07/21 58.1 kg  06/01/21 66.2 kg  03/28/21 68.5 kg  03/08/21 71.7 kg    BMI:  Body mass index is 18.95 kg/m. BMI WDL.  Estimated Nutritional Needs: Kcal: 25-30 kcal/kg Protein: > 1 gram protein/kg Fluid: 1 ml/kcal  Diet Order:  Diet Order             Diet Carb Modified Fluid consistency: Thin; Room service appropriate? Yes  Diet effective now                  Pt is also offered choice of unit snacks mid-morning and mid-afternoon.  Pt is eating as desired.   Lab results and medications reviewed.   Loistine Chance, RD, LDN, [place] Registered Dietitian II Certified Diabetes Care and Education Specialist Please refer to [facility] for RD and/or RD on-call/weekend/after hours pager

## 2021-11-05 DIAGNOSIS — F332 Major depressive disorder, recurrent severe without psychotic features: Secondary | ICD-10-CM | POA: Diagnosis not present

## 2021-11-05 LAB — GLUCOSE, CAPILLARY
Glucose-Capillary: 119 mg/dL — ABNORMAL HIGH (ref 70–99)
Glucose-Capillary: 157 mg/dL — ABNORMAL HIGH (ref 70–99)
Glucose-Capillary: 116 mg/dL — ABNORMAL HIGH (ref 70–99)
Glucose-Capillary: 129 mg/dL — ABNORMAL HIGH (ref 70–99)

## 2021-11-05 MED ORDER — OXYCODONE-ACETAMINOPHEN 5-325 MG PO TABS
1.0000 | ORAL_TABLET | Freq: Three times a day (TID) | ORAL | Status: DC
  Administered 2021-11-05 – 2021-11-07 (×7): 2 via ORAL
  Filled 2021-11-05 (×7): qty 2

## 2021-11-05 MED ORDER — DOCUSATE SODIUM 100 MG PO CAPS
100.0000 mg | ORAL_CAPSULE | Freq: Two times a day (BID) | ORAL | Status: DC
  Administered 2021-11-05 – 2021-11-07 (×4): 100 mg via ORAL
  Filled 2021-11-05 (×4): qty 1

## 2021-11-05 NOTE — Progress Notes (Signed)
Patient alert and oriented x 4 affect is flat but brightens upon approach, she denies  SI/HI/AVH interacting appropriately with peers and staff, complaint with medication regimen 15 minutes safety checks monitored will continue to monitor.

## 2021-11-05 NOTE — Progress Notes (Signed)
[facility] MD Progress Note  11/05/2021 1:11 PM Amy Wolfe  MRN:  [id number] Subjective: Follow-up patient with major depression and multiple medical problems.  Patient was complaining of severe pain today from eating.  The doses of her narcotics did not lined up with mealtime.  She is requesting a change in that.  Otherwise reports mood is feeling better.  Slept some last night.  Still dysphoric but no suicidal ideation. Principal Problem: Major depressive disorder, recurrent severe without psychotic features (Amy Wolfe) Diagnosis: Principal Problem:   Major depressive disorder, recurrent severe without psychotic features (Amy Wolfe) Active Problems:   Essential hypertension   Pancreatitis, chronic (HCC)   Portal vein thrombosis   Anticoagulated   Intractable vomiting with nausea   Diabetes mellitus due to underlying condition with hyperglycemia, with long-term current use of insulin (HCC)   Chronic pain syndrome   Alcohol-induced chronic pancreatitis (HCC)   Alcohol abuse  Total Time spent with patient: 30 minutes  Past Psychiatric History: Past history of depression and alcohol abuse  Past Medical History:  Past Medical History:  Diagnosis Date   Abdominal pain    Anxiety    Coronary artery disease    Diabetes mellitus without complication (HCC)    Dyspnea    GERD (gastroesophageal reflux disease)    H/O blood clots    Hypertension    Liver abscess    Pancreatitis 2019   PONV (postoperative nausea and vomiting) 11/07/2018   Thyroid disease    Uterine fibroid     Past Surgical History:  Procedure Laterality Date   BILIARY BRUSHING  08/22/2021   Procedure: BILIARY BRUSHING;  Surgeon: Irving Copas., MD;  Location: [facility];  Service: Gastroenterology;;   BIOPSY  06/01/2021   Procedure: BIOPSY;  Surgeon: Irving Copas., MD;  Location: [facility];  Service: Gastroenterology;;   CESAREAN SECTION     EMBOLIZATION N/A 06/14/2018   Procedure: Uterine  Embolization;  Surgeon: Algernon Huxley, MD;  Location: [facility];  Service: Cardiovascular;  Laterality: N/A;   ENDOSCOPIC RETROGRADE CHOLANGIOPANCREATOGRAPHY (ERCP) WITH PROPOFOL N/A 08/22/2021   Procedure: ENDOSCOPIC RETROGRADE CHOLANGIOPANCREATOGRAPHY (ERCP) WITH PROPOFOL;  Surgeon: Rush Landmark Telford Nab., MD;  Location: [facility];  Service: Gastroenterology;  Laterality: N/A;   ESOPHAGOGASTRODUODENOSCOPY (EGD) WITH PROPOFOL N/A 06/01/2021   Procedure: ESOPHAGOGASTRODUODENOSCOPY (EGD) WITH PROPOFOL;  Surgeon: Rush Landmark Telford Nab., MD;  Location: [facility];  Service: Gastroenterology;  Laterality: N/A;   ESOPHAGOGASTRODUODENOSCOPY (EGD) WITH PROPOFOL N/A 08/22/2021   Procedure: ESOPHAGOGASTRODUODENOSCOPY (EGD) WITH PROPOFOL;  Surgeon: Rush Landmark Telford Nab., MD;  Location: [facility];  Service: Gastroenterology;  Laterality: N/A;   EUS N/A 06/01/2021   Procedure: UPPER ENDOSCOPIC ULTRASOUND (EUS) RADIAL;  Surgeon: Irving Copas., MD;  Location: [facility];  Service: Gastroenterology;  Laterality: N/A;   EUS N/A 08/22/2021   Procedure: ESOPHAGEAL ENDOSCOPIC ULTRASOUND (EUS) RADIAL;  Surgeon: Rush Landmark Telford Nab., MD;  Location: [facility];  Service: Gastroenterology;  Laterality: N/A;   laceration finger Right    LAPAROSCOPIC VAGINAL HYSTERECTOMY WITH SALPINGECTOMY Bilateral 10/28/2018   Procedure: LAPAROSCOPIC ASSISTED VAGINAL HYSTERECTOMY BILATERAL WITH SALPINGECTOMY;  Surgeon: Rubie Maid, MD;  Location: [facility];  Service: Gynecology;  Laterality: Bilateral;   NEUROLYTIC CELIAC PLEXUS  08/22/2021   Procedure: NEUROLYTIC CELIAC PLEXUS;  Surgeon: Rush Landmark Telford Nab., MD;  Location: [facility];  Service: Gastroenterology;;   PANCREATIC STENT PLACEMENT  08/22/2021   Procedure: PANCREATIC STENT PLACEMENT;  Surgeon: Irving Copas., MD;  Location: [facility];  Service: Gastroenterology;;   REMOVAL OF STONES  08/22/2021   Procedure: REMOVAL OF STONES;   Surgeon: Rush Landmark Telford Nab., MD;  Location: [facility];  Service: Gastroenterology;;   [name]  08/22/2021   Procedure: [name];  Surgeon: Rush Landmark Telford Nab., MD;  Location: [facility];  Service: Gastroenterology;;   THYROIDECTOMY, PARTIAL     VAGINAL HYSTERECTOMY Bilateral 10/28/2018   Procedure: HYSTERECTOMY VAGINAL WITH BILATERAL SALPINGECTOMY;  Surgeon: Rubie Maid, MD;  Location: [facility];  Service: Gynecology;  Laterality: Bilateral;   Family History:  Family History  Problem Relation Age of Onset   Hypertension Mother    Diabetes Mother    Brain cancer Father    Aneurysm Brother    Bone cancer Maternal Grandmother    Lung cancer Maternal Grandfather    Cancer Paternal Grandmother    Cancer Paternal Grandfather    Colon cancer Neg Hx    Esophageal cancer Neg Hx    Inflammatory bowel disease Neg Hx    Liver disease Neg Hx    Pancreatic cancer Neg Hx    Rectal cancer Neg Hx    Stomach cancer Neg Hx    Family Psychiatric  History: See previous Social History:  Social History   Substance and Sexual Activity  Alcohol Use Not Currently     Social History   Substance and Sexual Activity  Drug Use Yes   Types: Marijuana   Comment: daily    Social History   Socioeconomic History   Marital status: Single    Spouse name: Not on file   Number of children: 2   Years of education: 12   Highest education level: Associate degree: academic program  Occupational History   Occupation: [employer]  Tobacco Use   Smoking status: Every Day    Packs/day: 0.50    Years: 35.00    Total pack years: 17.50    Types: Cigarettes   Smokeless tobacco: Never  Vaping Use   Vaping Use: Never used  Substance and Sexual Activity   Alcohol use: Not Currently   Drug use: Yes    Types: Marijuana    Comment: daily   Sexual activity: Not Currently    Birth control/protection: Surgical  Other Topics Concern   Not on file  Social History Narrative   Not on file    Social Determinants of Health   Financial Resource Strain: Low Risk  (10/04/2018)   Overall Financial Resource Strain (CARDIA)    Difficulty of Paying Living Expenses: Not hard at all  Food Insecurity: No Food Insecurity (11/03/2021)   Hunger Vital Sign    Worried About Running Out of Food in the Last Year: Never true    Ran Out of Food in the Last Year: Never true  Transportation Needs: Unmet Transportation Needs (11/03/2021)   PRAPARE - Transportation    Lack of Transportation (Medical): Yes    Lack of Transportation (Non-Medical): Yes  Physical Activity: Inactive (10/04/2018)   Exercise Vital Sign    Days of Exercise per Week: 0 days    Minutes of Exercise per Session: 0 min  Stress: No Stress Concern Present (10/04/2018)   [place]    Feeling of Stress : Only a little  Social Connections: Unknown (10/04/2018)   Social Connection and Isolation Panel [NHANES]    Frequency of Communication with Friends and Family: More than three times a week    Frequency of Social Gatherings with Friends and Family: More than three times a week    Attends Religious Services:  Never    Active Member of Clubs or Organizations: No    Attends Archivist Meetings: Never    Marital Status: Not on file   Additional Social History:                         Sleep: Fair  Appetite:  Fair  Current Medications: Current Facility-Administered Medications  Medication Dose Route Frequency Provider Last Rate Last Admin   acetaminophen (TYLENOL) tablet 650 mg  650 mg Oral Q6H PRN Patrecia Pour, NP       alum & mag hydroxide-simeth (MAALOX/MYLANTA) 200-200-20 MG/5ML suspension 30 mL  30 mL Oral Q4H PRN Patrecia Pour, NP       amLODipine (NORVASC) tablet 5 mg  5 mg Oral Daily Patrecia Pour, NP   5 mg at 11/05/21 0905   docusate sodium (COLACE) capsule 100 mg  100 mg Oral BID Etoile Looman T, MD       feeding supplement  (GLUCERNA SHAKE) (GLUCERNA SHAKE) liquid 237 mL  237 mL Oral BID BM Parks Ranger, DO   237 mL at 11/04/21 1622   insulin aspart (novoLOG) injection 0-5 Units  0-5 Units Subcutaneous QHS Patrecia Pour, NP   3 Units at 11/04/21 2132   insulin aspart (novoLOG) injection 0-9 Units  0-9 Units Subcutaneous TID WC Patrecia Pour, NP   2 Units at 11/05/21 1218   insulin aspart (novoLOG) injection 3 Units  3 Units Subcutaneous TID WC Patrecia Pour, NP   3 Units at 11/05/21 1220   insulin glargine-yfgn (SEMGLEE) injection 6 Units  6 Units Subcutaneous Daily Patrecia Pour, NP   6 Units at 11/05/21 3976   lipase/protease/amylase (CREON) capsule 36,000 Units  36,000 Units Oral BID PRN Patrecia Pour, NP   36,000 Units at 11/04/21 2127   lipase/protease/amylase (CREON) capsule 72,000 Units  72,000 Units Oral TID WC Patrecia Pour, NP   72,000 Units at 11/05/21 1128   magnesium hydroxide (MILK OF MAGNESIA) suspension 30 mL  30 mL Oral Daily PRN Patrecia Pour, NP   30 mL at 11/05/21 0908   mirtazapine (REMERON SOL-TAB) disintegrating tablet 15 mg  15 mg Oral QHS Damek Ende, Madie Reno, MD   15 mg at 11/04/21 2133   multivitamin with minerals tablet 1 tablet  1 tablet Oral Daily Parks Ranger, DO   1 tablet at 11/05/21 7341   nicotine (NICODERM CQ - dosed in mg/24 hours) patch 21 mg  21 mg Transdermal Daily Patrecia Pour, NP       ondansetron (ZOFRAN-ODT) disintegrating tablet 4 mg  4 mg Oral Q6H PRN Dai Mcadams, Madie Reno, MD   4 mg at 11/05/21 1128   oxyCODONE-acetaminophen (PERCOCET/ROXICET) 5-325 MG per tablet 1-2 tablet  1-2 tablet Oral TID AC Stanislav Gervase, Madie Reno, MD   2 tablet at 11/05/21 1250   pantoprazole (PROTONIX) EC tablet 40 mg  40 mg Oral Daily Patrecia Pour, NP   40 mg at 11/05/21 9379   protein supplement (ENSURE MAX) liquid  11 oz Oral QHS Parks Ranger, DO       rivaroxaban Alveda Reasons) tablet 20 mg  20 mg Oral Q supper Patrecia Pour, NP   20 mg at 11/04/21 1634   silver  sulfADIAZINE (SILVADENE) 1 % cream   Topical BID Patrecia Pour, NP        Lab Results:  Results for orders placed or performed  during the hospital encounter of 11/03/21 (from the past 48 hour(s))  Glucose, capillary     Status: Abnormal   Collection Time: 11/03/21  5:23 PM  Result Value Ref Range   Glucose-Capillary 340 (H) 70 - 99 mg/dL    Comment: Glucose reference range applies only to samples taken after fasting for at least 8 hours.   Comment 1 Notify RN   Glucose, capillary     Status: Abnormal   Collection Time: 11/03/21 10:01 PM  Result Value Ref Range   Glucose-Capillary 115 (H) 70 - 99 mg/dL    Comment: Glucose reference range applies only to samples taken after fasting for at least 8 hours.  Glucose, capillary     Status: Abnormal   Collection Time: 11/04/21  7:48 AM  Result Value Ref Range   Glucose-Capillary 246 (H) 70 - 99 mg/dL    Comment: Glucose reference range applies only to samples taken after fasting for at least 8 hours.  Glucose, capillary     Status: Abnormal   Collection Time: 11/04/21 11:22 AM  Result Value Ref Range   Glucose-Capillary 107 (H) 70 - 99 mg/dL    Comment: Glucose reference range applies only to samples taken after fasting for at least 8 hours.  TSH     Status: None   Collection Time: 11/04/21  4:32 PM  Result Value Ref Range   TSH 2.446 0.350 - 4.500 uIU/mL    Comment: Performed by a 3rd Generation assay with a functional sensitivity of <=0.01 uIU/mL. Performed at [facility], [address] [place], [place] [address]   Hemoglobin A1c     Status: Abnormal   Collection Time: 11/04/21  4:32 PM  Result Value Ref Range   Hgb A1c MFr Bld 7.7 (H) 4.8 - 5.6 %    Comment: (NOTE) Pre diabetes:          5.7%-6.4%  Diabetes:              >6.4%  Glycemic control for   <7.0% adults with diabetes    Mean Plasma Glucose 174.29 mg/dL    Comment: Performed at [facility] [address], [place], [place] [address]  Glucose,  capillary     Status: Abnormal   Collection Time: 11/04/21  4:35 PM  Result Value Ref Range   Glucose-Capillary 197 (H) 70 - 99 mg/dL    Comment: Glucose reference range applies only to samples taken after fasting for at least 8 hours.  Glucose, capillary     Status: Abnormal   Collection Time: 11/04/21  8:41 PM  Result Value Ref Range   Glucose-Capillary 230 (H) 70 - 99 mg/dL    Comment: Glucose reference range applies only to samples taken after fasting for at least 8 hours.  Glucose, capillary     Status: Abnormal   Collection Time: 11/05/21  7:45 AM  Result Value Ref Range   Glucose-Capillary 119 (H) 70 - 99 mg/dL    Comment: Glucose reference range applies only to samples taken after fasting for at least 8 hours.  Glucose, capillary     Status: Abnormal   Collection Time: 11/05/21 11:25 AM  Result Value Ref Range   Glucose-Capillary 157 (H) 70 - 99 mg/dL    Comment: Glucose reference range applies only to samples taken after fasting for at least 8 hours.    Blood Alcohol level:  Lab Results  Component Value Date   ETH 203 (H) 10/31/2021   ETH <10  16/10/9602    Metabolic Disorder Labs: Lab Results  Component Value Date   HGBA1C 7.7 (H) 11/04/2021   MPG 174.29 11/04/2021   MPG 206 02/24/2021   No results found for: "PROLACTIN" Lab Results  Component Value Date   TRIG 127.0 10/05/2020    Physical Findings: AIMS: Facial and Oral Movements Muscles of Facial Expression: None, normal Lips and Perioral Area: None, normal Jaw: None, normal Tongue: None, normal,Extremity Movements Upper (arms, wrists, hands, fingers): None, normal Lower (legs, knees, ankles, toes): None, normal, Trunk Movements Neck, shoulders, hips: None, normal, Overall Severity Severity of abnormal movements (highest score from questions above): None, normal Incapacitation due to abnormal movements: None, normal Patient's awareness of abnormal movements (rate only patient's report): No Awareness,  Dental Status Current problems with teeth and/or dentures?: No Does patient usually wear dentures?: No  CIWA:    COWS:     Musculoskeletal: Strength & Muscle Tone: within normal limits Gait & Station: normal Patient leans: N/A  Psychiatric Specialty Exam:  Presentation  General Appearance: No data recorded Eye Contact:No data recorded Speech:No data recorded Speech Volume:No data recorded Handedness:No data recorded  Mood and Affect  Mood:No data recorded Affect:No data recorded  Thought Process  Thought Processes:No data recorded Descriptions of Associations:No data recorded Orientation:No data recorded Thought Content:No data recorded History of Schizophrenia/Schizoaffective disorder:No data recorded Duration of Psychotic Symptoms:No data recorded Hallucinations:No data recorded Ideas of Reference:No data recorded Suicidal Thoughts:No data recorded Homicidal Thoughts:No data recorded  Sensorium  Memory:No data recorded Judgment:No data recorded Insight:No data recorded  Executive Functions  Concentration:No data recorded Attention Span:No data recorded Recall:No data recorded Fund of Knowledge:No data recorded Language:No data recorded  Psychomotor Activity  Psychomotor Activity:No data recorded  Assets  Assets:No data recorded  Sleep  Sleep:No data recorded   Physical Exam: Physical Exam Vitals and nursing note reviewed.  Constitutional:      Appearance: Normal appearance.  HENT:     Head: Normocephalic and atraumatic.     Mouth/Throat:     Pharynx: Oropharynx is clear.  Eyes:     Pupils: Pupils are equal, round, and reactive to light.  Cardiovascular:     Rate and Rhythm: Normal rate and regular rhythm.  Pulmonary:     Effort: Pulmonary effort is normal.     Breath sounds: Normal breath sounds.  Abdominal:     General: Abdomen is flat.     Palpations: Abdomen is soft.  Musculoskeletal:        General: Normal range of motion.  Skin:     General: Skin is warm and dry.  Neurological:     General: No focal deficit present.     Mental Status: She is alert. Mental status is at baseline.  Psychiatric:        Attention and Perception: Attention normal.        Mood and Affect: Mood normal.        Speech: Speech normal.        Behavior: Behavior normal.        Thought Content: Thought content normal.        Cognition and Memory: Cognition normal.    Review of Systems  Constitutional: Negative.   HENT: Negative.    Eyes: Negative.   Respiratory: Negative.    Cardiovascular: Negative.   Gastrointestinal:  Positive for abdominal pain and constipation.  Musculoskeletal: Negative.   Skin: Negative.   Neurological: Negative.   Psychiatric/Behavioral:  Positive for depression. Negative for suicidal ideas.  Blood pressure (!) 139/101, pulse 76, temperature 98.5 F (36.9 C), temperature source Oral, resp. rate 20, height '5\' 7"'$  (1.702 m), weight 54.9 kg, last menstrual period 09/30/2018, SpO2 100 %. Body mass index is 18.95 kg/m.   Treatment Plan Summary: Medication management and Plan continue current medication.  Change the doses of the narcotic so that they would line up with meals starting right away.  Added stool softener at her request.  Supportive counseling and encouragement.  [name], MD 11/05/2021, 1:11 PM

## 2021-11-05 NOTE — Plan of Care (Signed)
Pt denies SI HI AVH.  Pt c/o pain 9/10 in back and RUQ r/t pancreatitis.  Pt compliant with all medications.  Pt endorses reduced pain to 4/10 after percocet 10 mg.  Pt asked for more percocet at 11:30 stating that she needed it before eating.  MD did order the percocet to be 3x/day before meals.  Pt has been calm and cooperative throughout the day.  Pt frequently was on the phone with family and friends.  Continued monitoring via q 15 minute observations.

## 2021-11-06 ENCOUNTER — Other Ambulatory Visit: Payer: Self-pay

## 2021-11-06 DIAGNOSIS — F332 Major depressive disorder, recurrent severe without psychotic features: Secondary | ICD-10-CM | POA: Diagnosis not present

## 2021-11-06 LAB — GLUCOSE, CAPILLARY
Glucose-Capillary: 117 mg/dL — ABNORMAL HIGH (ref 70–99)
Glucose-Capillary: 128 mg/dL — ABNORMAL HIGH (ref 70–99)
Glucose-Capillary: 111 mg/dL — ABNORMAL HIGH (ref 70–99)
Glucose-Capillary: 171 mg/dL — ABNORMAL HIGH (ref 70–99)

## 2021-11-06 MED ORDER — DOCUSATE SODIUM 100 MG PO CAPS
100.0000 mg | ORAL_CAPSULE | Freq: Two times a day (BID) | ORAL | 0 refills | Status: DC
  Filled 2021-11-06: qty 60, 30d supply, fill #0

## 2021-11-06 MED ORDER — INSULIN ASPART 100 UNIT/ML IJ SOLN: 3.0000 [IU] | Freq: Three times a day (TID) | INTRAMUSCULAR | 1 refills | Status: DC

## 2021-11-06 MED ORDER — DOCUSATE SODIUM 100 MG PO CAPS: 100.0000 mg | ORAL_CAPSULE | Freq: Two times a day (BID) | ORAL | 1 refills | Status: DC

## 2021-11-06 MED ORDER — INSULIN GLARGINE-YFGN 100 UNIT/ML ~~LOC~~ SOLN: 6.0000 [IU] | Freq: Every day | SUBCUTANEOUS | 1 refills | Status: DC

## 2021-11-06 MED ORDER — MIRTAZAPINE 15 MG PO TABS
30.0000 mg | ORAL_TABLET | Freq: Every day | ORAL | Status: DC
  Administered 2021-11-06: 30 mg via ORAL
  Filled 2021-11-06: qty 2

## 2021-11-06 MED ORDER — ADULT MULTIVITAMIN W/MINERALS CH: 1.0000 | ORAL_TABLET | Freq: Every day | ORAL | 1 refills | Status: DC

## 2021-11-06 MED ORDER — PANTOPRAZOLE SODIUM 40 MG PO TBEC
40.0000 mg | DELAYED_RELEASE_TABLET | Freq: Every day | ORAL | 0 refills | Status: DC
  Filled 2021-11-06: qty 30, 30d supply, fill #0

## 2021-11-06 MED ORDER — PANCRELIPASE (LIP-PROT-AMYL) 36000-114000 UNITS PO CPEP: 72000.0000 [IU] | ORAL_CAPSULE | Freq: Three times a day (TID) | ORAL | 1 refills | Status: DC

## 2021-11-06 MED ORDER — OXYCODONE-ACETAMINOPHEN 5-325 MG PO TABS: 1.0000 | ORAL_TABLET | Freq: Three times a day (TID) | ORAL | 0 refills | Status: DC

## 2021-11-06 MED ORDER — AMLODIPINE BESYLATE 5 MG PO TABS: 5.0000 mg | ORAL_TABLET | Freq: Every day | ORAL | 1 refills | Status: DC

## 2021-11-06 MED ORDER — MIRTAZAPINE 30 MG PO TABS: 30.0000 mg | ORAL_TABLET | Freq: Every day | ORAL | 1 refills | Status: DC

## 2021-11-06 MED ORDER — RIVAROXABAN 20 MG PO TABS
20.0000 mg | ORAL_TABLET | Freq: Every day | ORAL | 0 refills | Status: DC
  Filled 2021-11-06: qty 30, 30d supply, fill #0

## 2021-11-06 MED ORDER — NOVOLOG FLEXPEN 100 UNIT/ML ~~LOC~~ SOPN
3.0000 [IU] | PEN_INJECTOR | Freq: Three times a day (TID) | SUBCUTANEOUS | 0 refills | Status: AC
  Filled 2021-11-06: qty 3, 30d supply, fill #0

## 2021-11-06 MED ORDER — PANTOPRAZOLE SODIUM 40 MG PO TBEC: 40.0000 mg | DELAYED_RELEASE_TABLET | Freq: Every day | ORAL | 1 refills | Status: DC

## 2021-11-06 MED ORDER — ADULT MULTIVITAMIN W/MINERALS CH
1.0000 | ORAL_TABLET | Freq: Every day | ORAL | 0 refills | Status: DC
  Filled 2021-11-06: qty 30, 30d supply, fill #0

## 2021-11-06 MED ORDER — MIRTAZAPINE 30 MG PO TABS
30.0000 mg | ORAL_TABLET | Freq: Every day | ORAL | 0 refills | Status: DC
  Filled 2021-11-06: qty 30, 30d supply, fill #0

## 2021-11-06 MED ORDER — INSULIN GLARGINE-YFGN 100 UNIT/ML ~~LOC~~ SOLN
6.0000 [IU] | Freq: Every day | SUBCUTANEOUS | 0 refills | Status: DC
  Filled 2021-11-06: qty 10, 166d supply, fill #0

## 2021-11-06 MED ORDER — AMLODIPINE BESYLATE 5 MG PO TABS
5.0000 mg | ORAL_TABLET | Freq: Every day | ORAL | 0 refills | Status: DC
  Filled 2021-11-06: qty 30, 30d supply, fill #0

## 2021-11-06 MED ORDER — RIVAROXABAN 20 MG PO TABS: 20.0000 mg | ORAL_TABLET | Freq: Every day | ORAL | 1 refills | Status: DC

## 2021-11-06 MED ORDER — PANCRELIPASE (LIP-PROT-AMYL) 36000-114000 UNITS PO CPEP
72000.0000 [IU] | ORAL_CAPSULE | Freq: Three times a day (TID) | ORAL | 0 refills | Status: DC
  Filled 2021-11-06: qty 180, 30d supply, fill #0

## 2021-11-06 NOTE — [facility] Group Notes (Addendum)
LCSW Group Therapy Note  11/06/2021   8:54 -9:40 AM  Type of Therapy and Topic:  Group Therapy: Anger Cues and Responses  Participation Level:  Active    Description of Group:   In this group, patients learned how to recognize the physical, cognitive, emotional, and behavioral responses they have to anger-provoking situations.  They identified a recent time they became angry and how they reacted.  They analyzed how their reaction was possibly beneficial and how it was possibly unhelpful.  The group discussed a variety of healthier coping skills that could help with such a situation in the future.  They also learned that anger is a second emotion fueled by other feelings and explored their own emotions that may frequently fuel their anger.  Focus was placed on how helpful it is to recognize the underlying emotions to our anger, because working on those can lead to a more permanent solution as well as our ability to focus on the important rather than the urgent.  Therapeutic Goals: Patients will remember their last incident of anger and how they felt emotionally and physically, what their thoughts were at the time, and how they behaved. Patients will identify how their behavior at that time worked for them, as well as how it worked against them. Patients will explore possible new behaviors to use in future anger situations. Patients will learn that anger itself is normal and cannot be eliminated, and that healthier reactions can assist with resolving conflict rather than worsening situations. Patients will learn that anger is a secondary emotion and worked to identify some of the underlying feelings that may lead to anger.  Summary of Patient Progress:  The patient shared she was last angry on Nov 2nd when she came into the hospital. Patient participated in group with sharing things on topic. Patient reports her goal is to connect to therapy at discharge as she has never really tried therapy and  really wants to.   Therapeutic Modalities:   Cognitive Behavioral Therapy  Tye Savoy

## 2021-11-06 NOTE — Progress Notes (Signed)
[facility] MD Progress Note  11/06/2021 10:17 AM Amy Wolfe  MRN:  [id number] Subjective: Patient seen and chart reviewed.  Patient says she is feeling much better.  Slept better last night.  Pain is under better control.  She denies any suicidal thoughts denies homicidal thoughts does not appear to be psychotic or agitated today.  Patient feels like she is stabilizing and shows good insight. Principal Problem: Major depressive disorder, recurrent severe without psychotic features ([place]) Diagnosis: Principal Problem:   Major depressive disorder, recurrent severe without psychotic features ([place]) Active Problems:   Essential hypertension   Pancreatitis, chronic (HCC)   Portal vein thrombosis   Anticoagulated   Intractable vomiting with nausea   Diabetes mellitus due to underlying condition with hyperglycemia, with long-term current use of insulin (HCC)   Chronic pain syndrome   Alcohol-induced chronic pancreatitis (HCC)   Alcohol abuse  Total Time spent with patient: 30 minutes  Past Psychiatric History: Past history of mood symptoms substance abuse chronic pain and depression  Past Medical History:  Past Medical History:  Diagnosis Date   Abdominal pain    Anxiety    Coronary artery disease    Diabetes mellitus without complication (HCC)    Dyspnea    GERD (gastroesophageal reflux disease)    H/O blood clots    Hypertension    Liver abscess    Pancreatitis 2019   PONV (postoperative nausea and vomiting) 11/07/2018   Thyroid disease    Uterine fibroid     Past Surgical History:  Procedure Laterality Date   BILIARY BRUSHING  08/22/2021   Procedure: BILIARY BRUSHING;  Surgeon: Irving Copas., MD;  Location: [facility];  Service: Gastroenterology;;   BIOPSY  06/01/2021   Procedure: BIOPSY;  Surgeon: Irving Copas., MD;  Location: [facility];  Service: Gastroenterology;;   CESAREAN SECTION     EMBOLIZATION N/A 06/14/2018   Procedure: Uterine Embolization;   Surgeon: Algernon Huxley, MD;  Location: [facility];  Service: Cardiovascular;  Laterality: N/A;   ENDOSCOPIC RETROGRADE CHOLANGIOPANCREATOGRAPHY (ERCP) WITH PROPOFOL N/A 08/22/2021   Procedure: ENDOSCOPIC RETROGRADE CHOLANGIOPANCREATOGRAPHY (ERCP) WITH PROPOFOL;  Surgeon: Rush Landmark Telford Nab., MD;  Location: [facility];  Service: Gastroenterology;  Laterality: N/A;   ESOPHAGOGASTRODUODENOSCOPY (EGD) WITH PROPOFOL N/A 06/01/2021   Procedure: ESOPHAGOGASTRODUODENOSCOPY (EGD) WITH PROPOFOL;  Surgeon: Rush Landmark Telford Nab., MD;  Location: [facility];  Service: Gastroenterology;  Laterality: N/A;   ESOPHAGOGASTRODUODENOSCOPY (EGD) WITH PROPOFOL N/A 08/22/2021   Procedure: ESOPHAGOGASTRODUODENOSCOPY (EGD) WITH PROPOFOL;  Surgeon: Rush Landmark Telford Nab., MD;  Location: [facility];  Service: Gastroenterology;  Laterality: N/A;   EUS N/A 06/01/2021   Procedure: UPPER ENDOSCOPIC ULTRASOUND (EUS) RADIAL;  Surgeon: Irving Copas., MD;  Location: [facility];  Service: Gastroenterology;  Laterality: N/A;   EUS N/A 08/22/2021   Procedure: ESOPHAGEAL ENDOSCOPIC ULTRASOUND (EUS) RADIAL;  Surgeon: Rush Landmark Telford Nab., MD;  Location: [facility];  Service: Gastroenterology;  Laterality: N/A;   laceration finger Right    LAPAROSCOPIC VAGINAL HYSTERECTOMY WITH SALPINGECTOMY Bilateral 10/28/2018   Procedure: LAPAROSCOPIC ASSISTED VAGINAL HYSTERECTOMY BILATERAL WITH SALPINGECTOMY;  Surgeon: Rubie Maid, MD;  Location: [facility];  Service: Gynecology;  Laterality: Bilateral;   NEUROLYTIC CELIAC PLEXUS  08/22/2021   Procedure: NEUROLYTIC CELIAC PLEXUS;  Surgeon: Rush Landmark Telford Nab., MD;  Location: [facility];  Service: Gastroenterology;;   PANCREATIC STENT PLACEMENT  08/22/2021   Procedure: PANCREATIC STENT PLACEMENT;  Surgeon: Irving Copas., MD;  Location: [facility];  Service: Gastroenterology;;   REMOVAL OF STONES  08/22/2021   Procedure: REMOVAL OF STONES;  Surgeon:  Rush Landmark Telford Nab., MD;  Location: [facility];  Service: Gastroenterology;;   Amy Wolfe  08/22/2021   Procedure: Amy Wolfe;  Surgeon: Rush Landmark Telford Nab., MD;  Location: [facility];  Service: Gastroenterology;;   THYROIDECTOMY, PARTIAL     VAGINAL HYSTERECTOMY Bilateral 10/28/2018   Procedure: HYSTERECTOMY VAGINAL WITH BILATERAL SALPINGECTOMY;  Surgeon: Rubie Maid, MD;  Location: [facility];  Service: Gynecology;  Laterality: Bilateral;   Family History:  Family History  Problem Relation Age of Onset   Hypertension Mother    Diabetes Mother    Brain cancer Father    Aneurysm Brother    Bone cancer Maternal Grandmother    Lung cancer Maternal Grandfather    Cancer Paternal Grandmother    Cancer Paternal Grandfather    Colon cancer Neg Hx    Esophageal cancer Neg Hx    Inflammatory bowel disease Neg Hx    Liver disease Neg Hx    Pancreatic cancer Neg Hx    Rectal cancer Neg Hx    Stomach cancer Neg Hx    Family Psychiatric  History: See previous Social History:  Social History   Substance and Sexual Activity  Alcohol Use Not Currently     Social History   Substance and Sexual Activity  Drug Use Yes   Types: Marijuana   Comment: daily    Social History   Socioeconomic History   Marital status: Single    Spouse name: Not on file   Number of children: 2   Years of education: 12   Highest education level: Associate degree: academic program  Occupational History   Occupation: [employer]  Tobacco Use   Smoking status: Every Day    Packs/day: 0.50    Years: 35.00    Total pack years: 17.50    Types: Cigarettes   Smokeless tobacco: Never  Vaping Use   Vaping Use: Never used  Substance and Sexual Activity   Alcohol use: Not Currently   Drug use: Yes    Types: Marijuana    Comment: daily   Sexual activity: Not Currently    Birth control/protection: Surgical  Other Topics Concern   Not on file  Social History Narrative   Not on file   Social  Determinants of Health   Financial Resource Strain: Low Risk  (10/04/2018)   Overall Financial Resource Strain (CARDIA)    Difficulty of Paying Living Expenses: Not hard at all  Food Insecurity: No Food Insecurity (11/03/2021)   Hunger Vital Sign    Worried About Running Out of Food in the Last Year: Never true    Ran Out of Food in the Last Year: Never true  Transportation Needs: Unmet Transportation Needs (11/03/2021)   PRAPARE - Transportation    Lack of Transportation (Medical): Yes    Lack of Transportation (Non-Medical): Yes  Physical Activity: Inactive (10/04/2018)   Exercise Vital Sign    Days of Exercise per Week: 0 days    Minutes of Exercise per Session: 0 min  Stress: No Stress Concern Present (10/04/2018)   [place]    Feeling of Stress : Only a little  Social Connections: Unknown (10/04/2018)   Social Connection and Isolation Panel [NHANES]    Frequency of Communication with Friends and Family: More than three times a week    Frequency of Social Gatherings with Friends and Family: More than three times a week    Attends Religious Services:  Never    Active Member of Clubs or Organizations: No    Attends Archivist Meetings: Never    Marital Status: Not on file   Additional Social History:                         Sleep: Fair  Appetite:  Fair  Current Medications: Current Facility-Administered Medications  Medication Dose Route Frequency Provider Last Rate Last Admin   acetaminophen (TYLENOL) tablet 650 mg  650 mg Oral Q6H PRN Patrecia Pour, NP   650 mg at 11/05/21 2034   alum & mag hydroxide-simeth (MAALOX/MYLANTA) 200-200-20 MG/5ML suspension 30 mL  30 mL Oral Q4H PRN Patrecia Pour, NP       amLODipine (NORVASC) tablet 5 mg  5 mg Oral Daily Patrecia Pour, NP   5 mg at 11/06/21 8338   docusate sodium (COLACE) capsule 100 mg  100 mg Oral BID Traveion Ruddock T, MD   100 mg at  11/06/21 0752   feeding supplement (GLUCERNA SHAKE) (GLUCERNA SHAKE) liquid 237 mL  237 mL Oral BID BM Parks Ranger, DO   237 mL at 11/04/21 1622   insulin aspart (novoLOG) injection 0-5 Units  0-5 Units Subcutaneous QHS Patrecia Pour, NP   3 Units at 11/04/21 2132   insulin aspart (novoLOG) injection 0-9 Units  0-9 Units Subcutaneous TID WC Patrecia Pour, NP   2 Units at 11/05/21 1218   insulin aspart (novoLOG) injection 3 Units  3 Units Subcutaneous TID WC Patrecia Pour, NP   3 Units at 11/06/21 0839   insulin glargine-yfgn (SEMGLEE) injection 6 Units  6 Units Subcutaneous Daily Patrecia Pour, NP   6 Units at 11/06/21 2505   lipase/protease/amylase (CREON) capsule 36,000 Units  36,000 Units Oral BID PRN Patrecia Pour, NP   36,000 Units at 11/05/21 2033   lipase/protease/amylase (CREON) capsule 72,000 Units  72,000 Units Oral TID WC Patrecia Pour, NP   72,000 Units at 11/06/21 0751   magnesium hydroxide (MILK OF MAGNESIA) suspension 30 mL  30 mL Oral Daily PRN Patrecia Pour, NP   30 mL at 11/05/21 0908   mirtazapine (REMERON SOL-TAB) disintegrating tablet 15 mg  15 mg Oral QHS Lanier Millon, Madie Reno, MD   15 mg at 11/05/21 2112   multivitamin with minerals tablet 1 tablet  1 tablet Oral Daily Parks Ranger, DO   1 tablet at 11/06/21 0753   ondansetron (ZOFRAN-ODT) disintegrating tablet 4 mg  4 mg Oral Q6H PRN Denton Derks, Madie Reno, MD   4 mg at 11/06/21 0755   oxyCODONE-acetaminophen (PERCOCET/ROXICET) 5-325 MG per tablet 1-2 tablet  1-2 tablet Oral TID AC Ilia Engelbert, Madie Reno, MD   2 tablet at 11/06/21 0750   pantoprazole (PROTONIX) EC tablet 40 mg  40 mg Oral Daily Patrecia Pour, NP   40 mg at 11/06/21 3976   protein supplement (ENSURE MAX) liquid  11 oz Oral QHS Parks Ranger, DO       rivaroxaban Alveda Reasons) tablet 20 mg  20 mg Oral Q supper Patrecia Pour, NP   20 mg at 11/05/21 1622    Lab Results:  Results for orders placed or performed during the hospital  encounter of 11/03/21 (from the past 48 hour(s))  Glucose, capillary     Status: Abnormal   Collection Time: 11/04/21 11:22 AM  Result Value Ref Range   Glucose-Capillary 107 (H) 70 - 99  mg/dL    Comment: Glucose reference range applies only to samples taken after fasting for at least 8 hours.  TSH     Status: None   Collection Time: 11/04/21  4:32 PM  Result Value Ref Range   TSH 2.446 0.350 - 4.500 uIU/mL    Comment: Performed by a 3rd Generation assay with a functional sensitivity of <=0.01 uIU/mL. Performed at [facility], [address] [place], [place] [address]   Hemoglobin A1c     Status: Abnormal   Collection Time: 11/04/21  4:32 PM  Result Value Ref Range   Hgb A1c MFr Bld 7.7 (H) 4.8 - 5.6 %    Comment: (NOTE) Pre diabetes:          5.7%-6.4%  Diabetes:              >6.4%  Glycemic control for   <7.0% adults with diabetes    Mean Plasma Glucose 174.29 mg/dL    Comment: Performed at [facility] [address] [place], [place] [address]  Glucose, capillary     Status: Abnormal   Collection Time: 11/04/21  4:35 PM  Result Value Ref Range   Glucose-Capillary 197 (H) 70 - 99 mg/dL    Comment: Glucose reference range applies only to samples taken after fasting for at least 8 hours.  Glucose, capillary     Status: Abnormal   Collection Time: 11/04/21  8:41 PM  Result Value Ref Range   Glucose-Capillary 230 (H) 70 - 99 mg/dL    Comment: Glucose reference range applies only to samples taken after fasting for at least 8 hours.  Glucose, capillary     Status: Abnormal   Collection Time: 11/05/21  7:45 AM  Result Value Ref Range   Glucose-Capillary 119 (H) 70 - 99 mg/dL    Comment: Glucose reference range applies only to samples taken after fasting for at least 8 hours.  Glucose, capillary     Status: Abnormal   Collection Time: 11/05/21 11:25 AM  Result Value Ref Range   Glucose-Capillary 157 (H) 70 - 99 mg/dL    Comment: Glucose reference range  applies only to samples taken after fasting for at least 8 hours.  Glucose, capillary     Status: Abnormal   Collection Time: 11/05/21  4:18 PM  Result Value Ref Range   Glucose-Capillary 116 (H) 70 - 99 mg/dL    Comment: Glucose reference range applies only to samples taken after fasting for at least 8 hours.  Glucose, capillary     Status: Abnormal   Collection Time: 11/05/21  8:37 PM  Result Value Ref Range   Glucose-Capillary 129 (H) 70 - 99 mg/dL    Comment: Glucose reference range applies only to samples taken after fasting for at least 8 hours.  Glucose, capillary     Status: Abnormal   Collection Time: 11/06/21  7:50 AM  Result Value Ref Range   Glucose-Capillary 117 (H) 70 - 99 mg/dL    Comment: Glucose reference range applies only to samples taken after fasting for at least 8 hours.    Blood Alcohol level:  Lab Results  Component Value Date   ETH 203 (H) 10/31/2021   ETH <10 47/82/9562    Metabolic Disorder Labs: Lab Results  Component Value Date   HGBA1C 7.7 (H) 11/04/2021   MPG 174.29 11/04/2021   MPG 206 02/24/2021   No results found for: "PROLACTIN" Lab Results  Component Value Date   TRIG 127.0  10/05/2020    Physical Findings: AIMS: Facial and Oral Movements Muscles of Facial Expression: None, normal Lips and Perioral Area: None, normal Jaw: None, normal Tongue: None, normal,Extremity Movements Upper (arms, wrists, hands, fingers): None, normal Lower (legs, knees, ankles, toes): None, normal, Trunk Movements Neck, shoulders, hips: None, normal, Overall Severity Severity of abnormal movements (highest score from questions above): None, normal Incapacitation due to abnormal movements: None, normal Patient's awareness of abnormal movements (rate only patient's report): No Awareness, Dental Status Current problems with teeth and/or dentures?: No Does patient usually wear dentures?: No  CIWA:    COWS:     Musculoskeletal: Strength & Muscle Tone:  within normal limits Gait & Station: normal Patient leans: N/A  Psychiatric Specialty Exam:  Presentation  General Appearance: No data recorded Eye Contact:No data recorded Speech:No data recorded Speech Volume:No data recorded Handedness:No data recorded  Mood and Affect  Mood:No data recorded Affect:No data recorded  Thought Process  Thought Processes:No data recorded Descriptions of Associations:No data recorded Orientation:No data recorded Thought Content:No data recorded History of Schizophrenia/Schizoaffective disorder:No data recorded Duration of Psychotic Symptoms:No data recorded Hallucinations:No data recorded Ideas of Reference:No data recorded Suicidal Thoughts:No data recorded Homicidal Thoughts:No data recorded  Sensorium  Memory:No data recorded Judgment:No data recorded Insight:No data recorded  Executive Functions  Concentration:No data recorded Attention Span:No data recorded Recall:No data recorded Fund of Knowledge:No data recorded Language:No data recorded  Psychomotor Activity  Psychomotor Activity:No data recorded  Assets  Assets:No data recorded  Sleep  Sleep:No data recorded   Physical Exam: Physical Exam Vitals reviewed.  Constitutional:      Appearance: Normal appearance.  HENT:     Head: Normocephalic and atraumatic.     Mouth/Throat:     Pharynx: Oropharynx is clear.  Eyes:     Pupils: Pupils are equal, round, and reactive to light.  Cardiovascular:     Rate and Rhythm: Normal rate and regular rhythm.  Pulmonary:     Effort: Pulmonary effort is normal.     Breath sounds: Normal breath sounds.  Abdominal:     General: Abdomen is flat.     Palpations: Abdomen is soft.  Musculoskeletal:        General: Normal range of motion.  Skin:    General: Skin is warm and dry.  Neurological:     General: No focal deficit present.     Mental Status: She is alert. Mental status is at baseline.  Psychiatric:        Mood and  Affect: Mood normal.        Thought Content: Thought content normal.    Review of Systems  Constitutional: Negative.   HENT: Negative.    Eyes: Negative.   Respiratory: Negative.    Cardiovascular: Negative.   Gastrointestinal: Negative.   Musculoskeletal: Negative.   Skin: Negative.   Neurological: Negative.   Psychiatric/Behavioral: Negative.     Blood pressure (!) 139/101, pulse 76, temperature 98.5 F (36.9 C), temperature source Oral, resp. rate 20, height '5\' 7"'$  (1.702 m), weight 54.9 kg, last menstrual period 09/30/2018, SpO2 100 %. Body mass index is 18.95 kg/m.   Treatment Plan Summary: Medication management and Plan patient is stabilizing and may be ready for discharge as early as tomorrow.  Plan is to increase dose of mirtazapine.  She is requesting discontinuation of Silvadene cream and nicotine patch which are fine.  No change in blood pressure medicine as we do not have a reading yet from today.  Orders will  be initiated to start planning for discharge tomorrow.  Reviewed with patient the importance of having an outpatient pain provider because I can give her pain medicine for the time being but she needs to find a way to continue that longer term.  [name], MD 11/06/2021, 10:17 AM

## 2021-11-06 NOTE — Progress Notes (Signed)
Patient alert and oriented x 4, she is receptive to staff no distress noted, she is complaint with medication and interacting appropriately with peers and staff, patient denies SI/HI/AVH. Patient was offered emotional support. 15 minutes safety checks maintained will continue to monitor.

## 2021-11-06 NOTE — Plan of Care (Signed)
Pt is calm, cooperative, denies anxiety, depression and hopelessness, denies SI, HI AVH, continues to c/o abd pain 2/2 pancreatitis, but this is relieved with premedication before meals.  Pt was told she is leaving tomorrow and is happy about this.  Continued monitoring via q 15 minute observations.

## 2021-11-07 ENCOUNTER — Other Ambulatory Visit: Payer: Self-pay

## 2021-11-07 DIAGNOSIS — F332 Major depressive disorder, recurrent severe without psychotic features: Secondary | ICD-10-CM | POA: Diagnosis not present

## 2021-11-07 LAB — GLUCOSE, CAPILLARY
Glucose-Capillary: 123 mg/dL — ABNORMAL HIGH (ref 70–99)
Glucose-Capillary: 231 mg/dL — ABNORMAL HIGH (ref 70–99)

## 2021-11-07 MED ORDER — AMLODIPINE BESYLATE 5 MG PO TABS: ORAL_TABLET | ORAL | 1 refills | Status: DC

## 2021-11-07 MED ORDER — RIVAROXABAN 20 MG PO TABS: ORAL_TABLET | ORAL | 1 refills | Status: DC

## 2021-11-07 MED ORDER — PANTOPRAZOLE SODIUM 40 MG PO TBEC: DELAYED_RELEASE_TABLET | ORAL | 1 refills | Status: DC

## 2021-11-07 MED ORDER — MIRTAZAPINE 30 MG PO TABS: ORAL_TABLET | ORAL | 1 refills | Status: DC

## 2021-11-07 MED ORDER — INSULIN PEN NEEDLE 32G X 4 MM MISC
0 refills | Status: DC
  Filled 2021-11-07: qty 100, 34d supply, fill #0

## 2021-11-07 MED ORDER — INSULIN ASPART 100 UNIT/ML FLEXPEN: PEN_INJECTOR | SUBCUTANEOUS | 1 refills | Status: DC

## 2021-11-07 MED ORDER — INSULIN GLARGINE-YFGN 100 UNIT/ML ~~LOC~~ SOPN: PEN_INJECTOR | SUBCUTANEOUS | 1 refills | Status: DC

## 2021-11-07 MED ORDER — PANCRELIPASE (LIP-PROT-AMYL) 36000-114000 UNITS PO CPEP: ORAL_CAPSULE | ORAL | 1 refills | Status: DC

## 2021-11-07 MED ORDER — OXYCODONE-ACETAMINOPHEN 5-325 MG PO TABS
2.0000 | ORAL_TABLET | Freq: Three times a day (TID) | ORAL | 0 refills | Status: DC
  Filled 2021-11-07: qty 120, 20d supply, fill #0
  Filled 2021-11-07: qty 15, 5d supply, fill #0

## 2021-11-07 NOTE — BHH Suicide Risk Assessment (Signed)
[facility] Discharge Suicide Risk Assessment   Principal Problem: Major depressive disorder, recurrent severe without psychotic features (Amy Wolfe) Discharge Diagnoses: Principal Problem:   Major depressive disorder, recurrent severe without psychotic features (Amy Wolfe) Active Problems:   Essential hypertension   Pancreatitis, chronic (HCC)   Portal vein thrombosis   Anticoagulated   Intractable vomiting with nausea   Diabetes mellitus due to underlying condition with hyperglycemia, with long-term current use of insulin (HCC)   Chronic pain syndrome   Alcohol-induced chronic pancreatitis (HCC)   Alcohol abuse   Total Time spent with patient: 45 minutes  Musculoskeletal: Strength & Muscle Tone: within normal limits Gait & Station: normal Patient leans: N/A  Psychiatric Specialty Exam  Presentation  General Appearance: No data recorded Eye Contact:No data recorded Speech:No data recorded Speech Volume:No data recorded Handedness:No data recorded  Mood and Affect  Mood:No data recorded Duration of Depression Symptoms: Greater than two weeks  Affect:No data recorded  Thought Process  Thought Processes:No data recorded Descriptions of Associations:No data recorded Orientation:No data recorded Thought Content:No data recorded History of Schizophrenia/Schizoaffective disorder:No data recorded Duration of Psychotic Symptoms:No data recorded Hallucinations:No data recorded Ideas of Reference:No data recorded Suicidal Thoughts:No data recorded Homicidal Thoughts:No data recorded  Sensorium  Memory:No data recorded Judgment:No data recorded Insight:No data recorded  Executive Functions  Concentration:No data recorded Attention Span:No data recorded Recall:No data recorded Fund of Knowledge:No data recorded Language:No data recorded  Psychomotor Activity  Psychomotor Activity:No data recorded  Assets  Assets:No data recorded  Sleep  Sleep:No data recorded  Physical  Exam: Physical Exam Vitals and nursing note reviewed.  Constitutional:      Appearance: Normal appearance.  HENT:     Head: Normocephalic and atraumatic.     Mouth/Throat:     Pharynx: Oropharynx is clear.  Eyes:     Pupils: Pupils are equal, round, and reactive to light.  Cardiovascular:     Rate and Rhythm: Normal rate and regular rhythm.  Pulmonary:     Effort: Pulmonary effort is normal.     Breath sounds: Normal breath sounds.  Abdominal:     General: Abdomen is flat.     Palpations: Abdomen is soft.  Musculoskeletal:        General: Normal range of motion.  Skin:    General: Skin is warm and dry.  Neurological:     General: No focal deficit present.     Mental Status: She is alert. Mental status is at baseline.  Psychiatric:        Attention and Perception: Attention normal.        Mood and Affect: Mood normal.        Speech: Speech normal.        Behavior: Behavior normal.        Thought Content: Thought content normal.        Cognition and Memory: Cognition normal.    Review of Systems  Constitutional: Negative.   HENT: Negative.    Eyes: Negative.   Respiratory: Negative.    Cardiovascular: Negative.   Gastrointestinal: Negative.   Musculoskeletal: Negative.   Skin: Negative.   Neurological: Negative.   Psychiatric/Behavioral: Negative.     Blood pressure (!) 152/111, pulse 94, temperature 99.3 F (37.4 C), temperature source Oral, resp. rate 20, height '5\' 7"'$  (1.702 m), weight 54.9 kg, last menstrual period 09/30/2018, SpO2 100 %. Body mass index is 18.95 kg/m.  Mental Status Per Nursing Assessment::   On Admission:  Suicidal ideation indicated by others  Demographic Factors:  Living alone  Loss Factors: Loss of significant relationship  Historical Factors: Impulsivity  Risk Reduction Factors:   Sense of responsibility to family, Religious beliefs about death, Positive social support, Positive therapeutic relationship, and Positive coping  skills or problem solving skills  Continued Clinical Symptoms:  Depression:   Comorbid alcohol abuse/dependence Alcohol/Substance Abuse/Dependencies  Cognitive Features That Contribute To Risk:  None    Suicide Risk:  Minimal: No identifiable suicidal ideation.  Patients presenting with no risk factors but with morbid ruminations; may be classified as minimal risk based on the severity of the depressive symptoms    Plan Of Care/Follow-up recommendations:  Other:  Patient completely denies suicidal ideation and has not displayed any dangerous or violent or suicidal behavior.  She is in full agreement with recommended outpatient treatment plan and appears to be lucid and able to make clear decisions.  Discharged today with outpatient follow-up arranged.  [name], MD 11/07/2021, 10:10 AM

## 2021-11-07 NOTE — Progress Notes (Signed)
  [facility] Adult Case Management Discharge Plan :  Will you be returning to the same living situation after discharge:  Yes,  pt plans to return home upon discharge. At discharge, do you have transportation home?: No. Do you have the ability to pay for your medications: Yes,  [place] Medicaid Healthy Blue.  Release of information consent forms completed and in the chart;  Patient's signature needed at discharge.  Patient to Follow up at:  Follow-up Information     [facility] Follow up.   Why: Your appointment is scheudled for Wednesday, 11/09/21 at 9:00AM. Thanks! Contact information: [place] [address] [phone]                 Next level of care provider has access to [place] and Suicide Prevention discussed: Yes,  SPE completed with pt.     Has patient been referred to the Quitline?: Patient refused referral  Patient has been referred for addiction treatment: Yes  Shirl Harris, LCSW 11/07/2021, 2:16 PM

## 2021-11-07 NOTE — BHH Group Notes (Signed)
[facility] Group Notes:  (Nursing/MHT/Case Management/Adjunct)  Date:  11/07/2021  Time:  9:44 AM  Type of Therapy:  Group Therapy  Participation Level:  Active  Participation Quality:  Appropriate  Affect:  Appropriate and Excited  Cognitive:  Alert and Appropriate  Insight:  Appropriate and Good  Engagement in Group:  Engaged  Modes of Intervention:  Discussion and Education  Summary of Progress/Problems:  Maglione,Pearse Shiffler E 11/07/2021, 9:44 AM

## 2021-11-07 NOTE — Progress Notes (Signed)
Patient ID: Amy Wolfe, female   DOB: [birth year], 46 y.o.   MRN: [id number] Patient denies SI/HI/AVH. Belongings were returned to patient. Discharge instructions  including medication and follow up information were reviewed with patient and understanding was verbalized. Patient was not observed to be in distress at time of discharge. Patient was escorted out with staff to physician parking lot to be transported to the pharmacy by the courtesy car.

## 2021-11-07 NOTE — BHH Counselor (Signed)
CSW met with pt briefly to discuss discharge. She endorsed plans to return home and stated that she would need assist with transportation to get there. CSW informed her that team would assist her with transportation. Pt and CSW discussed scheduling aftercare appointment with [facility]. Pt is in agreement with this as her aftercare provider. Pt has clothes to wear home for discharge. She is a cigarette smoker but denied interest in cessation at this time. Pt endorsed substance use and need for treatment. Pt and CSW discussed other needs. Pt stated that she needed assistance with paying her rent. CSW helped pt with completing the pt assistance acknowledgement form and informed her that they may request a copy of her lease to speak with her about financial assistance. She agreed. CSW informed her that a resource guide would be added to her discharge paperwork as well. Pt agreed. No other concerns expressed. Contact ended without incident.   Chalmers Guest. Guerry Bruin, MSW, LCSW, [place] 11/07/2021 2:15 PM

## 2021-11-07 NOTE — Progress Notes (Signed)
Recreation Therapy Notes   Date: 11/07/2021  Time: 10:35am    Location: Craft room   Behavioral response: Appropriate  Intervention Topic:  Stress-Management   Discussion/Intervention:  Group content on today was focused on stress. The group defined stress and way to cope with stress. Participants expressed how they know when they are stresses out. Individuals described the different ways they have to cope with stress. The group stated reasons why it is important to cope with stress. Patient explained what good stress is and some examples. The group participated in the intervention "Stress Management". Individuals were separated into two group and answered questions related to stress.  .  Clinical Observations/Feedback: Patient came to group and was focused on what peers and staff had to say about stress-management. Participant identified listening to music as a way she manages her stress.Individual was social with peers and staff while participating in the intervention.    Won Kreuzer LRT/CTRS         Katleen Carraway 11/07/2021 11:55 AM
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
## 2021-11-07 NOTE — Progress Notes (Signed)
Patient alert and oriented x 4, her thoughts are organized and coherent she endorses pain was medicated per standing order. Patient denies SI/HI/AVH 15 minutes safety checks maintained, will continue to monitor.

## 2021-11-07 NOTE — Progress Notes (Signed)
Recreation Therapy Notes  INPATIENT RECREATION TR PLAN  Patient Details Name: Amy Wolfe MRN: [id number] DOB: Jun 16, 1975 Today's Date: 11/07/2021  Rec Therapy Plan Is patient appropriate for Therapeutic Recreation?: Yes Treatment times per week: at least 3 Estimated Length of Stay: 5-7 days TR Treatment/Interventions: Group participation (Comment)  Discharge Criteria Pt will be discharged from therapy if:: Discharged Treatment plan/goals/alternatives discussed and agreed upon by:: Patient/family  Discharge Summary Short term goals set: Patient will identify 3 positive coping skills strategies to use post d/c within 5 recreation therapy group sessions Short term goals met: Adequate for discharge Progress toward goals comments: Groups attended Which groups?: Stress management, Other (Comment) (Self-care) Reason goals not met: N/A Therapeutic equipment acquired: N/A Reason patient discharged from therapy: Discharge from hospital Pt/family agrees with progress & goals achieved: Yes Date patient discharged from therapy: 11/07/21   [name] 11/07/2021, 12:52 PM

## 2021-11-07 NOTE — Discharge Summary (Signed)
Physician Discharge Summary Note  Patient:  Amy Wolfe is an 46 y.o., female MRN:  [id number] DOB:  Dec 19, 1975 Patient phone:  [phone] (home)  Patient address:   [address] [place] [address],  Total Time spent with patient: 30 minutes  Date of Admission:  11/03/2021 Date of Discharge: 11/07/2021  Reason for Admission: Admitted after making suicidal statements while displaying extreme emotion and very upset lots of symptoms of depression multiple physical problems major life stresses.  Principal Problem: Major depressive disorder, recurrent severe without psychotic features [facility]) Discharge Diagnoses: Principal Problem:   Major depressive disorder, recurrent severe without psychotic features ([place]) Active Problems:   Essential hypertension   Pancreatitis, chronic (HCC)   Portal vein thrombosis   Anticoagulated   Intractable vomiting with nausea   Diabetes mellitus due to underlying condition with hyperglycemia, with long-term current use of insulin (HCC)   Chronic pain syndrome   Alcohol-induced chronic pancreatitis (HCC)   Alcohol abuse   Past Psychiatric History: History of depression and alcohol abuse  Past Medical History:  Past Medical History:  Diagnosis Date   Abdominal pain    Anxiety    Coronary artery disease    Diabetes mellitus without complication (HCC)    Dyspnea    GERD (gastroesophageal reflux disease)    H/O blood clots    Hypertension    Liver abscess    Pancreatitis 2019   PONV (postoperative nausea and vomiting) 11/07/2018   Thyroid disease    Uterine fibroid     Past Surgical History:  Procedure Laterality Date   BILIARY BRUSHING  08/22/2021   Procedure: BILIARY BRUSHING;  Surgeon: Irving Copas., MD;  Location: [facility];  Service: Gastroenterology;;   BIOPSY  06/01/2021   Procedure: BIOPSY;  Surgeon: Irving Copas., MD;  Location: [facility];  Service: Gastroenterology;;   CESAREAN SECTION      EMBOLIZATION N/A 06/14/2018   Procedure: Uterine Embolization;  Surgeon: Algernon Huxley, MD;  Location: [place];  Service: Cardiovascular;  Laterality: N/A;   ENDOSCOPIC RETROGRADE CHOLANGIOPANCREATOGRAPHY (ERCP) WITH PROPOFOL N/A 08/22/2021   Procedure: ENDOSCOPIC RETROGRADE CHOLANGIOPANCREATOGRAPHY (ERCP) WITH PROPOFOL;  Surgeon: Rush Landmark [place] Nab., MD;  Location: [facility];  Service: Gastroenterology;  Laterality: N/A;   ESOPHAGOGASTRODUODENOSCOPY (EGD) WITH PROPOFOL N/A 06/01/2021   Procedure: ESOPHAGOGASTRODUODENOSCOPY (EGD) WITH PROPOFOL;  Surgeon: Rush Landmark [place] Nab., MD;  Location: [facility];  Service: Gastroenterology;  Laterality: N/A;   ESOPHAGOGASTRODUODENOSCOPY (EGD) WITH PROPOFOL N/A 08/22/2021   Procedure: ESOPHAGOGASTRODUODENOSCOPY (EGD) WITH PROPOFOL;  Surgeon: Rush Landmark [place] Nab., MD;  Location: [facility];  Service: Gastroenterology;  Laterality: N/A;   EUS N/A 06/01/2021   Procedure: UPPER ENDOSCOPIC ULTRASOUND (EUS) RADIAL;  Surgeon: Irving Copas., MD;  Location: [facility];  Service: Gastroenterology;  Laterality: N/A;   EUS N/A 08/22/2021   Procedure: ESOPHAGEAL ENDOSCOPIC ULTRASOUND (EUS) RADIAL;  Surgeon: Rush Landmark [place] Nab., MD;  Location: [facility];  Service: Gastroenterology;  Laterality: N/A;   laceration finger Right    LAPAROSCOPIC VAGINAL HYSTERECTOMY WITH SALPINGECTOMY Bilateral 10/28/2018   Procedure: LAPAROSCOPIC ASSISTED VAGINAL HYSTERECTOMY BILATERAL WITH SALPINGECTOMY;  Surgeon: Rubie Maid, MD;  Location: [facility];  Service: Gynecology;  Laterality: Bilateral;   NEUROLYTIC CELIAC PLEXUS  08/22/2021   Procedure: NEUROLYTIC CELIAC PLEXUS;  Surgeon: Rush Landmark [place] Nab., MD;  Location: [facility];  Service: Gastroenterology;;   PANCREATIC STENT PLACEMENT  08/22/2021   Procedure: PANCREATIC STENT PLACEMENT;  Surgeon: Irving Copas., MD;  Location: [facility];  Service: Gastroenterology;;   REMOVAL  OF  STONES  08/22/2021   Procedure: REMOVAL OF STONES;  Surgeon: Rush Landmark [place] Nab., MD;  Location: [facility];  Service: Gastroenterology;;   [name]  08/22/2021   Procedure: [name];  Surgeon: Rush Landmark [place] Nab., MD;  Location: [facility];  Service: Gastroenterology;;   THYROIDECTOMY, PARTIAL     VAGINAL HYSTERECTOMY Bilateral 10/28/2018   Procedure: HYSTERECTOMY VAGINAL WITH BILATERAL SALPINGECTOMY;  Surgeon: Rubie Maid, MD;  Location: [facility];  Service: Gynecology;  Laterality: Bilateral;   Family History:  Family History  Problem Relation Age of Onset   Hypertension Mother    Diabetes Mother    Brain cancer Father    Aneurysm Brother    Bone cancer Maternal Grandmother    Lung cancer Maternal Grandfather    Cancer Paternal Grandmother    Cancer Paternal Grandfather    Colon cancer Neg Hx    Esophageal cancer Neg Hx    Inflammatory bowel disease Neg Hx    Liver disease Neg Hx    Pancreatic cancer Neg Hx    Rectal cancer Neg Hx    Stomach cancer Neg Hx    Family Psychiatric  History: See previous.  Anxiety. Social History:  Social History   Substance and Sexual Activity  Alcohol Use Not Currently     Social History   Substance and Sexual Activity  Drug Use Yes   Types: Marijuana   Comment: daily    Social History   Socioeconomic History   Marital status: Single    Spouse name: Not on file   Number of children: 2   Years of education: 12   Highest education level: Associate degree: academic program  Occupational History   Occupation: [employer]  Tobacco Use   Smoking status: Every Day    Packs/day: 0.50    Years: 35.00    Total pack years: 17.50    Types: Cigarettes   Smokeless tobacco: Never  Vaping Use   Vaping Use: Never used  Substance and Sexual Activity   Alcohol use: Not Currently   Drug use: Yes    Types: Marijuana    Comment: daily   Sexual activity: Not Currently    Birth control/protection: Surgical  Other Topics  Concern   Not on file  Social History Narrative   Not on file   Social Determinants of Health   Financial Resource Strain: Low Risk  (10/04/2018)   Overall Financial Resource Strain (CARDIA)    Difficulty of Paying Living Expenses: Not hard at all  Food Insecurity: No Food Insecurity (11/03/2021)   Hunger Vital Sign    Worried About Running Out of Food in the Last Year: Never true    Ran Out of Food in the Last Year: Never true  Transportation Needs: Unmet Transportation Needs (11/03/2021)   PRAPARE - Transportation    Lack of Transportation (Medical): Yes    Lack of Transportation (Non-Medical): Yes  Physical Activity: Inactive (10/04/2018)   Exercise Vital Sign    Days of Exercise per Week: 0 days    Minutes of Exercise per Session: 0 min  Stress: No Stress Concern Present (10/04/2018)   Carl    Feeling of Stress : Only a little  Social Connections: Unknown (10/04/2018)   Social Connection and Isolation Panel [NHANES]    Frequency of Communication with Friends and Family: More than three times a week    Frequency of Social Gatherings with Friends and Family: More than three times a week  Attends Religious Services: Never    Active Member of Clubs or Organizations: No    Attends Archivist Meetings: Never    Marital Status: Not on file    Hospital Course: Admitted to psychiatric unit.  15-minute checks continued.  Patient was upset and irritable when she first came in but was never violent aggressive threatening or trying to harm her self.  She was ultimately cooperative with appropriate treatment.  Started on medicine for depression and also stabilized as far as her pancreatitis pain.  Engaged appropriately in individual and group therapy and counseling.  At the time of discharge affect and mood are stable and euthymic and she has agreed to appropriate outpatient mental health care.  Medically appears  to be stable.  Patient will be given a prescription for her narcotics for the pancreatitis pain but no refills and is warned that she needs to find her regular provider to continue maintaining appropriate treatment of the pain.  Physical Findings: AIMS: Facial and Oral Movements Muscles of Facial Expression: None, normal Lips and Perioral Area: None, normal Jaw: None, normal Tongue: None, normal,Extremity Movements Upper (arms, wrists, hands, fingers): None, normal Lower (legs, knees, ankles, toes): None, normal, Trunk Movements Neck, shoulders, hips: None, normal, Overall Severity Severity of abnormal movements (highest score from questions above): None, normal Incapacitation due to abnormal movements: None, normal Patient's awareness of abnormal movements (rate only patient's report): No Awareness, Dental Status Current problems with teeth and/or dentures?: No Does patient usually wear dentures?: No  CIWA:    COWS:     Musculoskeletal: Strength & Muscle Tone: within normal limits Gait & Station: normal Patient leans: N/A   Psychiatric Specialty Exam:  Presentation  General Appearance: No data recorded Eye Contact:No data recorded Speech:No data recorded Speech Volume:No data recorded Handedness:No data recorded  Mood and Affect  Mood:No data recorded Affect:No data recorded  Thought Process  Thought Processes:No data recorded Descriptions of Associations:No data recorded Orientation:No data recorded Thought Content:No data recorded History of Schizophrenia/Schizoaffective disorder:No data recorded Duration of Psychotic Symptoms:No data recorded Hallucinations:No data recorded Ideas of Reference:No data recorded Suicidal Thoughts:No data recorded Homicidal Thoughts:No data recorded  Sensorium  Memory:No data recorded Judgment:No data recorded Insight:No data recorded  Executive Functions  Concentration:No data recorded Attention Span:No data  recorded Recall:No data recorded Fund of Knowledge:No data recorded Language:No data recorded  Psychomotor Activity  Psychomotor Activity:No data recorded  Assets  Assets:No data recorded  Sleep  Sleep:No data recorded   Physical Exam: Physical Exam Vitals and nursing note reviewed.  Constitutional:      Appearance: Normal appearance.  HENT:     Head: Normocephalic and atraumatic.     Mouth/Throat:     Pharynx: Oropharynx is clear.  Eyes:     Pupils: Pupils are equal, round, and reactive to light.  Cardiovascular:     Rate and Rhythm: Normal rate and regular rhythm.  Pulmonary:     Effort: Pulmonary effort is normal.     Breath sounds: Normal breath sounds.  Abdominal:     General: Abdomen is flat.     Palpations: Abdomen is soft.  Musculoskeletal:        General: Normal range of motion.  Skin:    General: Skin is warm and dry.  Neurological:     General: No focal deficit present.     Mental Status: She is alert. Mental status is at baseline.  Psychiatric:        Attention and Perception: Attention  normal.        Mood and Affect: Mood normal.        Speech: Speech normal.        Behavior: Behavior normal.        Thought Content: Thought content normal.        Cognition and Memory: Cognition normal.        Judgment: Judgment normal.    Review of Systems  Constitutional: Negative.   HENT: Negative.    Eyes: Negative.   Respiratory: Negative.    Cardiovascular: Negative.   Gastrointestinal: Negative.   Musculoskeletal: Negative.   Skin: Negative.   Neurological: Negative.   Psychiatric/Behavioral: Negative.     Blood pressure (!) 152/111, pulse 94, temperature 99.3 F (37.4 C), temperature source Oral, resp. rate 20, height _0  (1.702 m), weight 54.9 kg, last menstrual period 09/30/2018, SpO2 100 %. Body mass index is 18.95 kg/m.   Social History   Tobacco Use  Smoking Status Every Day   Packs/day: 0.50   Years: 35.00   Total pack years: 17.50    Types: Cigarettes  Smokeless Tobacco Never   Tobacco Cessation:  A prescription for an FDA-approved tobacco cessation medication provided at discharge   Blood Alcohol level:  Lab Results  Component Value Date   ETH 203 (H) 10/31/2021   ETH <10 94/85/4627    Metabolic Disorder Labs:  Lab Results  Component Value Date   HGBA1C 7.7 (H) 11/04/2021   MPG 174.29 11/04/2021   MPG 206 02/24/2021   No results found for: "PROLACTIN" Lab Results  Component Value Date   TRIG 127.0 10/05/2020    See Psychiatric Specialty Exam and Suicide Risk Assessment completed by Attending Physician prior to discharge.  Discharge destination:  Home  Is patient on multiple antipsychotic therapies at discharge:  No   Has Patient had three or more failed trials of antipsychotic monotherapy by history:  No  Recommended Plan for Multiple Antipsychotic Therapies: NA  Discharge Instructions     Diet - low sodium heart healthy   Complete by: As directed    Increase activity slowly   Complete by: As directed       Allergies as of 11/07/2021   No Known Allergies      Medication List     STOP taking these medications    acetaminophen 500 MG tablet Commonly known as: TYLENOL   Levemir FlexTouch 100 UNIT/ML FlexPen Generic drug: insulin detemir Replaced by: insulin glargine-yfgn 100 UNIT/ML injection   Multivitamin Adult Chew   nicotine 21 mg/24hr patch Commonly known as: NICODERM CQ - dosed in mg/24 hours   ondansetron 8 MG tablet Commonly known as: Zofran       TAKE these medications      Indication  Accu-Chek Guide test strip Generic drug: glucose blood USE TO CHECK BLOOD SUGAR UP TO 4 TIMES DAILY AS DIRECTED  Indication: Diabetes   Accu-Chek Softclix Lancets lancets USE TO CHECK BLOOD SUGAR UP TO 4 TIMES DAILY AS DIRECTED  Indication: Diabetes   amLODipine 5 MG tablet Commonly known as: NORVASC Take 1 tablet (5 mg total) by mouth daily.  Indication: High Blood  Pressure Disorder   blood glucose meter kit and supplies Dispense based on patient and insurance preference. Use up to four times daily as directed. (FOR ICD-10 E10.9, E11.9).  Indication: Diabetes   blood glucose meter kit and supplies Kit Dispense based on patient and insurance preference. Use up to four times daily as directed.  Indication:  Diabetes   Creon 36000 UNITS Cpep capsule Generic drug: lipase/protease/amylase Take 2 capsules (72,000 Units total) by mouth 3 (three) times daily with meals. What changed:  how much to take when to take this additional instructions  Indication: Pancreatic Insufficiency   docusate sodium 100 MG capsule Commonly known as: COLACE Take 1 capsule (100 mg total) by mouth 2 (two) times daily.  Indication: Constipation   insulin glargine-yfgn 100 UNIT/ML injection Commonly known as: SEMGLEE Inject 0.06 mLs (6 Units total) into the skin daily. Replaces: Levemir FlexTouch 100 UNIT/ML FlexPen  Indication: Type 2 Diabetes   mirtazapine 30 MG tablet Commonly known as: REMERON Take 1 tablet (30 mg total) by mouth at bedtime.  Indication: Major Depressive Disorder   multivitamin with minerals Tabs tablet Take 1 tablet by mouth daily.  Indication: Malnutrition   NovoLOG FlexPen 100 UNIT/ML FlexPen Generic drug: insulin aspart Inject 3 Units into the skin 3 (three) times daily with meals.  Indication: Type 2 Diabetes   oxyCODONE-acetaminophen 5-325 MG tablet Commonly known as: PERCOCET/ROXICET Take 1-2 tablets by mouth 3 (three) times daily before meals. What changed:  when to take this reasons to take this  Indication: Pain   pantoprazole 40 MG tablet Commonly known as: PROTONIX Take 1 tablet (40 mg total) by mouth daily. What changed: when to take this  Indication: Gastroesophageal Reflux Disease   Pen Needles 30G X 8 MM Misc 1 each by Does not apply route daily. What changed: Another medication with the same name was added. Make  sure you understand how and when to take each.  Indication: Diabetes   Unifine Pentips 31G X 5 MM Misc Generic drug: Insulin Pen Needle use with levemir flexpen daily What changed: Another medication with the same name was added. Make sure you understand how and when to take each.  Indication: Diabetes   Unifine Pentips 32G X 4 MM Misc Generic drug: Insulin Pen Needle use with novolog flexpen What changed: You were already taking a medication with the same name, and this prescription was added. Make sure you understand how and when to take each.  Indication: Diabetes   rivaroxaban 20 MG Tabs tablet Commonly known as: Xarelto Take 1 tablet (20 mg total) by mouth daily with supper.  Indication: Blood Clot in a Deep Vein         Follow-up recommendations:  Other:  Follow-up with outpatient mental health treatment  Comments: Prescription and supply of medicine provided  Signed: [name], MD 11/07/2021, 10:12 AM

## 2021-11-07 NOTE — Plan of Care (Signed)
  Problem: Education: Goal: Knowledge of General Education information will improve Description: Including pain rating scale, medication(s)/side effects and non-pharmacologic comfort measures Outcome: Adequate for Discharge   Problem: Health Behavior/Discharge Planning: Goal: Ability to manage health-related needs will improve Outcome: Adequate for Discharge   Problem: Clinical Measurements: Goal: Ability to maintain clinical measurements within normal limits will improve Outcome: Adequate for Discharge Goal: Will remain free from infection Outcome: Adequate for Discharge Goal: Diagnostic test results will improve Outcome: Adequate for Discharge Goal: Respiratory complications will improve Outcome: Adequate for Discharge Goal: Cardiovascular complication will be avoided Outcome: Adequate for Discharge   Problem: Activity: Goal: Risk for activity intolerance will decrease Outcome: Adequate for Discharge   Problem: Nutrition: Goal: Adequate nutrition will be maintained Outcome: Adequate for Discharge   Problem: Coping: Goal: Level of anxiety will decrease Outcome: Adequate for Discharge   Problem: Elimination: Goal: Will not experience complications related to bowel motility Outcome: Adequate for Discharge Goal: Will not experience complications related to urinary retention Outcome: Adequate for Discharge   Problem: Pain Managment: Goal: General experience of comfort will improve Outcome: Adequate for Discharge   Problem: Safety: Goal: Ability to remain free from injury will improve Outcome: Adequate for Discharge   Problem: Skin Integrity: Goal: Risk for impaired skin integrity will decrease Outcome: Adequate for Discharge   Problem: Coping Skills Goal: STG - Patient will identify 3 positive coping skills strategies to use post d/c within 5 recreation therapy group sessions Description: STG - Patient will identify 3 positive coping skills strategies to use post d/c  within 5 recreation therapy group sessions Outcome: Adequate for Discharge

## 2021-11-07 NOTE — [facility] Suicide Risk Assessment (Signed)
[place] INPATIENT:  Family/Significant Other Suicide Prevention Education  Suicide Prevention Education:  Patient Refusal for Family/Significant Other Suicide Prevention Education: The patient Amy Wolfe has refused to provide written consent for family/significant other to be provided Family/Significant Other Suicide Prevention Education during admission and/or prior to discharge.  Physician notified.  SPE completed with pt, as pt refused to consent to family contact. SPI pamphlet provided to pt and pt was encouraged to share information with support network, ask questions, and talk about any concerns relating to SPE. Pt denies access to guns/firearms and verbalized understanding of information provided. Mobile Crisis information also provided to pt.  Shirl Harris 11/07/2021, 2:11 PM

## 2021-11-08 ENCOUNTER — Other Ambulatory Visit: Payer: Self-pay

## 2021-11-11 NOTE — [facility] Counselor (Signed)
CSW followed up with pt at [phone] regarding financial assistance application. Pt was informed that they would need a copy of the lease with her name on it or the rental agreement. She stated that she could fax it and CSW gave her [phone] to send information. She agreed, stating that she would send it tomorrow. CSW explained that he would not be here but that this would work. She stated that she would add CSW name to attention. CSW agreed. No other concerns expressed. Contact ended without incident.   Chalmers Guest. Guerry Bruin, MSW, [place], [place] 11/11/2021 3:26 PM

## 2021-11-17 ENCOUNTER — Inpatient Hospital Stay: Payer: Medicaid Other | Admitting: Internal Medicine

## 2021-11-17 NOTE — Progress Notes (Deleted)
Established Patient Office Visit  Subjective:  Patient ID: Amy Wolfe, female    DOB: 08-Dec-1975  Age: 46 y.o. MRN: [id number]  CC:  No chief complaint on file.   HPI [place] presents for hospital follow up.   Discharge Date: 11/03/21 Diagnosis: MDD/SI, acute on chronic pancreatitis, hypokalemia Procedures/tests: UDS positive for cocaine and cannabinoids.  Potassium on day of discharge 3.4, 2.9 on initial consultation Consultants: Psychiatry New medications: Remeron 30 mg, NovoLog 3 units at meals Discharge instructions: Follow-up with PCP, psychiatry Status: {Blank multiple:19196::"better","worse","stable","fluctuating"}  Alcohol Induced Pancreatitis: -Stopped drinking about 1 year ago ? -Has flares of severe cramping/sharp epigastric pains that radiate to the back on a monthly basis -Pain has been worse with eating any type of food but now pain is much better, has been eating again -Following with GI at [facility]- plan discussed: stop marijuana, tobacco use, PPI increase to BID, autoimmune work up. -Recently underwent EUS/ERCP 08/22/21 for PD cannulation and stenting/stone removal  -Currently on Creon, pantoprazole 40 mg -Follow with pain management for pain control  Hypertension: -Medications: Amlodipine 5 mg -Patient is compliant with above medications and reports no side effects. -Checking BP at home (average): *** -Highest BP at home: *** -Lowest BP at home: *** -Denies any SOB, CP, vision changes, LE edema or symptoms of hypotension -Diet: *** -Exercise: ***  Diabetes, new onset: -Last A1c 7.7% 11/23 -Medications: Levemir 6 units daily switched to insulin glargine 6 units in the hospital, added Novolog 3 units at meals  -Failed Meds: Tried Metformin 500 mg but could not tolerate -Checking BG at home: <230 but out of insulin -Diet: improved now that pain is better controlled  -Exercise: None -Eye exam: Due -Foot exam: Due -Microalbumin:  Due -Statin: No -PNA vaccine: Due -Denies symptoms of hypoglycemia, polyuria, polydipsia, numbness extremities, foot ulcers/trauma.   Chronic Portal Vein Thrombosis: -Had been on Xarelto 20 mg daily   Past Medical History:  Diagnosis Date   Abdominal pain    Anxiety    Coronary artery disease    Diabetes mellitus without complication (HCC)    Dyspnea    GERD (gastroesophageal reflux disease)    H/O blood clots    Hypertension    Liver abscess    Pancreatitis 2019   PONV (postoperative nausea and vomiting) 11/07/2018   Thyroid disease    Uterine fibroid     Past Surgical History:  Procedure Laterality Date   BILIARY BRUSHING  08/22/2021   Procedure: BILIARY BRUSHING;  Surgeon: Irving Copas., MD;  Location: [facility];  Service: Gastroenterology;;   BIOPSY  06/01/2021   Procedure: BIOPSY;  Surgeon: Irving Copas., MD;  Location: [facility];  Service: Gastroenterology;;   CESAREAN SECTION     EMBOLIZATION N/A 06/14/2018   Procedure: Uterine Embolization;  Surgeon: Algernon Huxley, MD;  Location: [facility];  Service: Cardiovascular;  Laterality: N/A;   ENDOSCOPIC RETROGRADE CHOLANGIOPANCREATOGRAPHY (ERCP) WITH PROPOFOL N/A 08/22/2021   Procedure: ENDOSCOPIC RETROGRADE CHOLANGIOPANCREATOGRAPHY (ERCP) WITH PROPOFOL;  Surgeon: Rush Landmark Telford Nab., MD;  Location: [facility];  Service: Gastroenterology;  Laterality: N/A;   ESOPHAGOGASTRODUODENOSCOPY (EGD) WITH PROPOFOL N/A 06/01/2021   Procedure: ESOPHAGOGASTRODUODENOSCOPY (EGD) WITH PROPOFOL;  Surgeon: Rush Landmark Telford Nab., MD;  Location: [facility];  Service: Gastroenterology;  Laterality: N/A;   ESOPHAGOGASTRODUODENOSCOPY (EGD) WITH PROPOFOL N/A 08/22/2021   Procedure: ESOPHAGOGASTRODUODENOSCOPY (EGD) WITH PROPOFOL;  Surgeon: Rush Landmark Telford Nab., MD;  Location: [facility];  Service: Gastroenterology;  Laterality: N/A;   EUS N/A 06/01/2021  Procedure: UPPER ENDOSCOPIC ULTRASOUND (EUS)  RADIAL;  Surgeon: Rush Landmark Telford Nab., MD;  Location: [facility];  Service: Gastroenterology;  Laterality: N/A;   EUS N/A 08/22/2021   Procedure: ESOPHAGEAL ENDOSCOPIC ULTRASOUND (EUS) RADIAL;  Surgeon: Rush Landmark Telford Nab., MD;  Location: [facility];  Service: Gastroenterology;  Laterality: N/A;   laceration finger Right    LAPAROSCOPIC VAGINAL HYSTERECTOMY WITH SALPINGECTOMY Bilateral 10/28/2018   Procedure: LAPAROSCOPIC ASSISTED VAGINAL HYSTERECTOMY BILATERAL WITH SALPINGECTOMY;  Surgeon: Rubie Maid, MD;  Location: [facility];  Service: Gynecology;  Laterality: Bilateral;   NEUROLYTIC CELIAC PLEXUS  08/22/2021   Procedure: NEUROLYTIC CELIAC PLEXUS;  Surgeon: Rush Landmark Telford Nab., MD;  Location: [facility];  Service: Gastroenterology;;   PANCREATIC STENT PLACEMENT  08/22/2021   Procedure: PANCREATIC STENT PLACEMENT;  Surgeon: Irving Copas., MD;  Location: [facility];  Service: Gastroenterology;;   REMOVAL OF STONES  08/22/2021   Procedure: REMOVAL OF STONES;  Surgeon: Irving Copas., MD;  Location: [facility];  Service: Gastroenterology;;   [name]  08/22/2021   Procedure: [name];  Surgeon: Irving Copas., MD;  Location: [facility];  Service: Gastroenterology;;   THYROIDECTOMY, PARTIAL     VAGINAL HYSTERECTOMY Bilateral 10/28/2018   Procedure: HYSTERECTOMY VAGINAL WITH BILATERAL SALPINGECTOMY;  Surgeon: Rubie Maid, MD;  Location: [facility];  Service: Gynecology;  Laterality: Bilateral;    Family History  Problem Relation Age of Onset   Hypertension Mother    Diabetes Mother    Brain cancer Father    Aneurysm Brother    Bone cancer Maternal Grandmother    Lung cancer Maternal Grandfather    Cancer Paternal Grandmother    Cancer Paternal Grandfather    Colon cancer Neg Hx    Esophageal cancer Neg Hx    Inflammatory bowel disease Neg Hx    Liver disease Neg Hx    Pancreatic cancer Neg Hx    Rectal cancer Neg Hx     Stomach cancer Neg Hx     Social History   Socioeconomic History   Marital status: Single    Spouse name: Not on file   Number of children: 2   Years of education: 12   Highest education level: Associate degree: academic program  Occupational History   Occupation: [employer]  Tobacco Use   Smoking status: Every Day    Packs/day: 0.50    Years: 35.00    Total pack years: 17.50    Types: Cigarettes   Smokeless tobacco: Never  Vaping Use   Vaping Use: Never used  Substance and Sexual Activity   Alcohol use: Not Currently   Drug use: Yes    Types: Marijuana    Comment: daily   Sexual activity: Not Currently    Birth control/protection: Surgical  Other Topics Concern   Not on file  Social History Narrative   Not on file   Social Determinants of Health   Financial Resource Strain: Low Risk  (10/04/2018)   Overall Financial Resource Strain (CARDIA)    Difficulty of Paying Living Expenses: Not hard at all  Food Insecurity: No Food Insecurity (11/03/2021)   Hunger Vital Sign    Worried About Running Out of Food in the Last Year: Never true    Ran Out of Food in the Last Year: Never true  Transportation Needs: Unmet Transportation Needs (11/03/2021)   PRAPARE - Transportation    Lack of Transportation (Medical): Yes    Lack of Transportation (Non-Medical): Yes  Physical Activity: Inactive (10/04/2018)   Exercise Vital Sign  Days of Exercise per Week: 0 days    Minutes of Exercise per Session: 0 min  Stress: No Stress Concern Present (10/04/2018)   [place]    Feeling of Stress : Only a little  Social Connections: Unknown (10/04/2018)   Social Connection and Isolation Panel [NHANES]    Frequency of Communication with Friends and Family: More than three times a week    Frequency of Social Gatherings with Friends and Family: More than three times a week    Attends Religious Services: Never    Corporate treasurer or Organizations: No    Attends Archivist Meetings: Never    Marital Status: Not on file  Intimate Partner Violence: Not At Risk (11/03/2021)   Humiliation, Afraid, Rape, and Kick questionnaire    Fear of Current or Ex-Partner: No    Emotionally Abused: No    Physically Abused: No    Sexually Abused: No    Outpatient Medications Prior to Visit  Medication Sig Dispense Refill   Accu-Chek Softclix Lancets lancets USE TO CHECK BLOOD SUGAR UP TO 4 TIMES DAILY AS DIRECTED 100 each 2   amLODipine (NORVASC) 5 MG tablet Take 1 tablet (5 mg total) by mouth daily. 30 tablet 0   amLODipine (NORVASC) 5 MG tablet Take one tablet by mouth daily 30 tablet 1   blood glucose meter kit and supplies KIT Dispense based on patient and insurance preference. Use up to four times daily as directed. 1 each 0   blood glucose meter kit and supplies Dispense based on patient and insurance preference. Use up to four times daily as directed. (FOR ICD-10 E10.9, E11.9). 1 each 0   docusate sodium (COLACE) 100 MG capsule Take 1 capsule (100 mg total) by mouth 2 (two) times daily. 60 capsule 0   glucose blood (ACCU-CHEK GUIDE) test strip USE TO CHECK BLOOD SUGAR UP TO 4 TIMES DAILY AS DIRECTED 100 each 2   insulin aspart (NOVOLOG) 100 UNIT/ML FlexPen Inject 3 units into the skin 3 times a day with meals 15 mL 1   insulin aspart (NOVOLOG FLEXPEN) 100 UNIT/ML FlexPen Inject 3 Units into the skin 3 (three) times daily with meals. 15 mL 0   insulin glargine-yfgn (SEMGLEE) 100 UNIT/ML injection Inject 0.06 mLs (6 Units total) into the skin daily. 10 mL 0   insulin glargine-yfgn (SEMGLEE) 100 UNIT/ML Pen Inject 0.6 ml into the skin daily 15 mL 1   Insulin Pen Needle (PEN NEEDLES) 30G X 8 MM MISC 1 each by Does not apply route daily. 90 each 3   Insulin Pen Needle 31G X 5 MM MISC use with levemir flexpen daily 100 each 3   Insulin Pen Needle 32G X 4 MM MISC use with novolog flexpen 100 each 0    lipase/protease/amylase (CREON) 36000 UNITS CPEP capsule Take 2 capsules by mouth 3 times daily with meals 180 capsule 1   mirtazapine (REMERON) 30 MG tablet take one tablet by mouth at bedtime 30 tablet 1   Multiple Vitamin (MULTIVITAMIN WITH MINERALS) TABS tablet Take 1 tablet by mouth daily. 30 tablet 0   oxyCODONE-acetaminophen (PERCOCET/ROXICET) 5-325 MG tablet Take 1-2 tablets by mouth 3 (three) times daily before meals. 120 tablet 0   pantoprazole (PROTONIX) 40 MG tablet Take one tablet by mouth daily 30 tablet 1   rivaroxaban (XARELTO) 20 MG TABS tablet Take 1 tablet (20 mg total) by mouth daily with supper. 30 tablet  0   rivaroxaban (XARELTO) 20 MG TABS tablet Take one tablet by mouth daily with supper 30 tablet 1   No facility-administered medications prior to visit.    No Known Allergies  ROS Review of Systems  Constitutional:  Negative for appetite change, chills and fever.  Respiratory:  Negative for shortness of breath.   Cardiovascular:  Negative for chest pain.  Gastrointestinal:  Negative for abdominal pain, constipation, diarrhea, nausea and vomiting.      Objective:    Physical Exam Constitutional:      Appearance: Normal appearance.  HENT:     Head: Normocephalic and atraumatic.  Eyes:     Conjunctiva/sclera: Conjunctivae normal.  Cardiovascular:     Rate and Rhythm: Normal rate and regular rhythm.     Pulses:          Dorsalis pedis pulses are 2+ on the right side and 2+ on the left side.  Pulmonary:     Effort: Pulmonary effort is normal.     Breath sounds: Normal breath sounds.  Musculoskeletal:     Right lower leg: No edema.     Left lower leg: No edema.     Right foot: Normal range of motion. No deformity, bunion, Charcot foot, foot drop or prominent metatarsal heads.     Left foot: Normal range of motion. No deformity, bunion, Charcot foot, foot drop or prominent metatarsal heads.  Feet:     Right foot:     Protective Sensation: 6 sites tested.   6 sites sensed.     Skin integrity: Skin integrity normal.     Toenail Condition: Right toenails are normal.     Left foot:     Protective Sensation: 6 sites tested.  6 sites sensed.     Skin integrity: Skin integrity normal.     Toenail Condition: Left toenails are normal.  Skin:    General: Skin is warm and dry.  Neurological:     General: No focal deficit present.     Mental Status: She is alert. Mental status is at baseline.  Psychiatric:        Mood and Affect: Mood normal.        Behavior: Behavior normal.     LMP 09/30/2018  Wt Readings from Last 3 Encounters:  11/01/21 128 lb 15.5 oz (58.5 kg)  10/16/21 130 lb (59 kg)  09/02/21 158 lb (71.7 kg)     Health Maintenance Due  Topic Date Due   COVID-19 Vaccine (1) Never done   OPHTHALMOLOGY EXAM  Never done   PAP SMEAR-Modifier  Never done   INFLUENZA VACCINE  Never done    There are no preventive care reminders to display for this patient.  Lab Results  Component Value Date   TSH 2.446 11/04/2021   Lab Results  Component Value Date   WBC 4.6 11/03/2021   HGB 17.2 (H) 11/03/2021   HCT 53.6 (H) 11/03/2021   MCV 83.9 11/03/2021   PLT 295 11/03/2021   Lab Results  Component Value Date   NA 136 11/03/2021   K 3.4 (L) 11/03/2021   CO2 29 11/03/2021   GLUCOSE 185 (H) 11/03/2021   BUN 14 11/03/2021   CREATININE 0.88 11/03/2021   BILITOT 0.4 10/31/2021   ALKPHOS 104 10/31/2021   AST 35 10/31/2021   ALT 26 10/31/2021   PROT 8.5 (H) 10/31/2021   ALBUMIN 4.3 10/31/2021   CALCIUM 9.4 11/03/2021   ANIONGAP 8 11/03/2021   EGFR 110 09/02/2021  GFR 97.61 03/08/2021   No results found for: "CHOL" No results found for: "HDL" No results found for: "LDLCALC" Lab Results  Component Value Date   TRIG 127.0 10/05/2020   No results found for: "CHOLHDL" Lab Results  Component Value Date   HGBA1C 7.7 (H) 11/04/2021      Assessment & Plan:   1. Diabetes mellitus due to underlying condition with  hyperglycemia, with long-term current use of insulin ([place]): POC A1c 6.7% today. Continue Levemir 6 units, refilled along with needles. Patient saying she is having an issue affording $10 insulin needles, referral to pharmacy placed to see if she qualifies for any assistance programs. Discussed how she needs the insulin and this needs to be prioritized. Foot exam and urine microalbumin today. Follow up in 3 months.   - POCT HgB A1C - Insulin Pen Needle (PEN NEEDLES) 30G X 8 MM MISC; 1 each by Does not apply route daily.  Dispense: 90 each; Refill: 3 - insulin detemir (LEVEMIR FLEXPEN) 100 UNIT/ML FlexPen; Inject 6 Units into the skin daily.  Dispense: 15 mL; Refill: 0 - AMB Referral to [facility] - Urine Microalbumin w/creat. ratio  2. Portal vein thrombosis: Has an appointment with vascular in January. I will refill her Xarelto 20 mg until she can get into vascular.   - rivaroxaban (XARELTO) 20 MG TABS tablet; Take 1 tablet (20 mg total) by mouth daily with supper.  Dispense: 90 tablet; Refill: 1  3. Protein-calorie malnutrition, unspecified severity ([place]): Nutrition status improved, gained weight. Recheck CMP today.  - COMPLETE METABOLIC PANEL WITH GFR  Follow-up: No follow-ups on file.   Teodora Medici, DO

## 2022-01-14 ENCOUNTER — Emergency Department
Admission: EM | Admit: 2022-01-14 | Discharge: 2022-01-15 | Disposition: A | Discharge status: Home / Self Care | Payer: Medicaid Other | Attending: Emergency Medicine | Admitting: Emergency Medicine

## 2022-01-14 ENCOUNTER — Emergency Department: Payer: Medicaid Other

## 2022-01-14 DIAGNOSIS — F332 Major depressive disorder, recurrent severe without psychotic features: Secondary | ICD-10-CM | POA: Diagnosis not present

## 2022-01-14 DIAGNOSIS — R45851 Suicidal ideations: Secondary | ICD-10-CM | POA: Diagnosis not present

## 2022-01-14 DIAGNOSIS — Z79899 Other long term (current) drug therapy: Secondary | ICD-10-CM | POA: Insufficient documentation

## 2022-01-14 DIAGNOSIS — E119 Type 2 diabetes mellitus without complications: Secondary | ICD-10-CM | POA: Diagnosis not present

## 2022-01-14 DIAGNOSIS — I2510 Atherosclerotic heart disease of native coronary artery without angina pectoris: Secondary | ICD-10-CM | POA: Diagnosis not present

## 2022-01-14 DIAGNOSIS — I10 Essential (primary) hypertension: Secondary | ICD-10-CM | POA: Diagnosis not present

## 2022-01-14 DIAGNOSIS — Z794 Long term (current) use of insulin: Secondary | ICD-10-CM | POA: Diagnosis not present

## 2022-01-14 LAB — SALICYLATE LEVEL: Salicylate Lvl: <7 mg/dL — ABNORMAL LOW (ref 7.0–30.0)

## 2022-01-14 LAB — COMPREHENSIVE METABOLIC PANEL
ALT: 15 U/L (ref 0–44)
AST: 26 U/L (ref 15–41)
Albumin: 4.3 g/dL (ref 3.5–5.0)
Alkaline Phosphatase: 75 U/L (ref 38–126)
Anion gap: 9 (ref 5–15)
BUN: 15 mg/dL (ref 6–20)
CO2: 28 mmol/L (ref 22–32)
Calcium: 9.4 mg/dL (ref 8.9–10.3)
Chloride: 99 mmol/L (ref 98–111)
Creatinine, Ser: 0.76 mg/dL (ref 0.44–1.00)
GFR, Estimated: >60 mL/min (ref >60)
Glucose, Bld: 218 mg/dL — ABNORMAL HIGH (ref 70–99)
Potassium: 4 mmol/L (ref 3.5–5.1)
Sodium: 136 mmol/L (ref 135–145)
Total Bilirubin: 0.8 mg/dL (ref 0.3–1.2)
Total Protein: 8.4 g/dL — ABNORMAL HIGH (ref 6.5–8.1)

## 2022-01-14 LAB — CBC WITH DIFFERENTIAL/PLATELET
Abs Immature Granulocytes: 0.01 10*3/uL (ref 0.00–0.07)
Basophils Absolute: 0.1 10*3/uL (ref 0.0–0.1)
Basophils Relative: 1 %
Eosinophils Absolute: 0.1 10*3/uL (ref 0.0–0.5)
Eosinophils Relative: 2 %
HCT: 48.5 % — ABNORMAL HIGH (ref 36.0–46.0)
Hemoglobin: 15.5 g/dL — ABNORMAL HIGH (ref 12.0–15.0)
Immature Granulocytes: 0 %
Lymphocytes Relative: 27 %
Lymphs Abs: 1.5 10*3/uL (ref 0.7–4.0)
MCH: 27.4 pg (ref 26.0–34.0)
MCHC: 32 g/dL (ref 30.0–36.0)
MCV: 85.8 fL (ref 80.0–100.0)
Monocytes Absolute: 0.7 10*3/uL (ref 0.1–1.0)
Monocytes Relative: 13 %
Neutro Abs: 3.2 10*3/uL (ref 1.7–7.7)
Neutrophils Relative %: 57 %
Platelets: 302 10*3/uL (ref 150–400)
RBC: 5.65 MIL/uL — ABNORMAL HIGH (ref 3.87–5.11)
RDW: 13.3 % (ref 11.5–15.5)
WBC: 5.6 10*3/uL (ref 4.0–10.5)
nRBC: 0 % (ref 0.0–0.2)

## 2022-01-14 LAB — ETHANOL: Alcohol, Ethyl (B): <10 mg/dL (ref <10)

## 2022-01-14 LAB — URINE DRUG SCREEN, QUALITATIVE (ARMC ONLY)
Amphetamines, Ur Screen: NOT DETECTED
Barbiturates, Ur Screen: NOT DETECTED
Benzodiazepine, Ur Scrn: POSITIVE — AB
Cannabinoid 50 Ng, Ur ~~LOC~~: POSITIVE — AB
Cocaine Metabolite,Ur ~~LOC~~: POSITIVE — AB
MDMA (Ecstasy)Ur Screen: NOT DETECTED
Methadone Scn, Ur: NOT DETECTED
Opiate, Ur Screen: NOT DETECTED
Phencyclidine (PCP) Ur S: NOT DETECTED
Tricyclic, Ur Screen: NOT DETECTED

## 2022-01-14 LAB — URINALYSIS, ROUTINE W REFLEX MICROSCOPIC
Bilirubin Urine: NEGATIVE
Glucose, UA: 50 mg/dL — AB
Ketones, ur: NEGATIVE mg/dL
Nitrite: POSITIVE — AB
Protein, ur: 30 mg/dL — AB
Specific Gravity, Urine: 1.026 (ref 1.005–1.030)
pH: 5 (ref 5.0–8.0)

## 2022-01-14 LAB — CBG MONITORING, ED
Glucose-Capillary: 184 mg/dL — ABNORMAL HIGH (ref 70–99)
Glucose-Capillary: 155 mg/dL — ABNORMAL HIGH (ref 70–99)
Glucose-Capillary: 188 mg/dL — ABNORMAL HIGH (ref 70–99)

## 2022-01-14 LAB — CHLAMYDIA/NGC RT PCR (ARMC ONLY)
Chlamydia Tr: NOT DETECTED
N gonorrhoeae: NOT DETECTED

## 2022-01-14 LAB — ACETAMINOPHEN LEVEL: Acetaminophen (Tylenol), Serum: <10 ug/mL — ABNORMAL LOW (ref 10–30)

## 2022-01-14 MED ORDER — INSULIN GLARGINE-YFGN 100 UNIT/ML ~~LOC~~ SOPN: 60.0000 [IU] | PEN_INJECTOR | SUBCUTANEOUS | Status: DC

## 2022-01-14 MED ORDER — ACETAMINOPHEN 500 MG PO TABS
1000.0000 mg | ORAL_TABLET | Freq: Once | ORAL | Status: DC
  Filled 2022-01-14: qty 2

## 2022-01-14 MED ORDER — ZIPRASIDONE MESYLATE 20 MG IM SOLR
20.0000 mg | Freq: Once | INTRAMUSCULAR | Status: DC
  Filled 2022-01-14: qty 20

## 2022-01-14 MED ORDER — ZIPRASIDONE MESYLATE 20 MG IM SOLR: 20.0000 mg | Freq: Once | INTRAMUSCULAR | Status: DC

## 2022-01-14 MED ORDER — MIRTAZAPINE 15 MG PO TABS
30.0000 mg | ORAL_TABLET | Freq: Every day | ORAL | Status: DC
  Filled 2022-01-14: qty 2

## 2022-01-14 MED ORDER — ADULT MULTIVITAMIN W/MINERALS CH
1.0000 | ORAL_TABLET | Freq: Every day | ORAL | Status: DC
  Filled 2022-01-14: qty 1

## 2022-01-14 MED ORDER — CEPHALEXIN 500 MG PO CAPS
500.0000 mg | ORAL_CAPSULE | Freq: Once | ORAL | Status: AC
  Administered 2022-01-14: 500 mg via ORAL
  Filled 2022-01-14: qty 1

## 2022-01-14 MED ORDER — PANTOPRAZOLE SODIUM 40 MG PO TBEC
40.0000 mg | DELAYED_RELEASE_TABLET | Freq: Every day | ORAL | Status: DC
  Filled 2022-01-14: qty 1

## 2022-01-14 MED ORDER — AMLODIPINE BESYLATE 5 MG PO TABS
5.0000 mg | ORAL_TABLET | Freq: Every day | ORAL | Status: DC
  Filled 2022-01-14: qty 1

## 2022-01-14 MED ORDER — INSULIN ASPART 100 UNIT/ML IJ SOLN
3.0000 [IU] | Freq: Three times a day (TID) | INTRAMUSCULAR | Status: DC
  Administered 2022-01-14 (×3): 3 [IU] via SUBCUTANEOUS
  Filled 2022-01-14 (×3): qty 1

## 2022-01-14 MED ORDER — INSULIN GLARGINE-YFGN 100 UNIT/ML ~~LOC~~ SOLN
6.0000 [IU] | Freq: Every day | SUBCUTANEOUS | Status: DC
  Administered 2022-01-15: 6 [IU] via SUBCUTANEOUS
  Filled 2022-01-14 (×2): qty 0.06

## 2022-01-14 MED ORDER — PANCRELIPASE (LIP-PROT-AMYL) 36000-114000 UNITS PO CPEP
72000.0000 [IU] | ORAL_CAPSULE | Freq: Three times a day (TID) | ORAL | Status: DC
  Administered 2022-01-14: 72000 [IU] via ORAL
  Filled 2022-01-14 (×4): qty 2

## 2022-01-14 MED ORDER — CEPHALEXIN 500 MG PO CAPS: 500.0000 mg | ORAL_CAPSULE | Freq: Two times a day (BID) | ORAL | 0 refills | Status: DC

## 2022-01-14 MED ORDER — RIVAROXABAN 20 MG PO TABS
20.0000 mg | ORAL_TABLET | Freq: Every day | ORAL | Status: DC
  Administered 2022-01-14: 20 mg via ORAL
  Filled 2022-01-14: qty 1

## 2022-01-14 MED ORDER — INSULIN GLARGINE-YFGN 100 UNIT/ML ~~LOC~~ SOPN: 6.0000 [IU] | PEN_INJECTOR | SUBCUTANEOUS | Status: DC

## 2022-01-14 NOTE — ED Notes (Signed)
Patient is IVC  will be discharge on 01-15-22 to [facility]  at 7.am
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
## 2022-01-14 NOTE — ED Notes (Addendum)
Report called to Lorina Rabon, at [facility], however [place] office will not be able to take pt until tomorrow morning. Hiram Comber RN, has been made aware.

## 2022-01-14 NOTE — ED Notes (Addendum)
Pt calling out asking for "enzyme pill" and complaining of abd pain due to not having her pill. Pt was told we are waiting for pharmacy to send it to [place]. Pharmacy was contacted around 1320 and responded by saying it will be up shortly.
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
## 2022-01-14 NOTE — ED Notes (Signed)
Pt given sprite 

## 2022-01-14 NOTE — BH Assessment (Signed)
Patient has been accepted to [facility].  Patient assigned to room Main [facility]/Adult Unit. Accepting physician is Dr. Carolee Rota.  Call report to [phone].  Representative was Domm.   ER Staff is aware of it:  Edd Arbour, ER Secretary  Dr. Archie Balboa, ER MD  Alroy Dust, Patient's Nurse
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
## 2022-01-14 NOTE — ED Notes (Addendum)
pt recieved snack and drink 

## 2022-01-14 NOTE — ED Provider Notes (Signed)
-----------------------------------------   4:12 PM on 01/14/2022 ----------------------------------------- Patient CBC is reassuring, chemistry reassuring.  Patient's urinalysis does show many bacteria and nitrite positive.  We will cover the patient with Keflex twice daily for the next 7 days.  Patient has been accepted to [facility].   Harvest Dark, MD 01/14/22 [phone]

## 2022-01-14 NOTE — ED Notes (Signed)
Pt refusing to dress out into burgundy scrubs. Pts wallet, pair of socks, and jean jacket collected - placed in belongings bag at this time.
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
## 2022-01-14 NOTE — ED Notes (Signed)
Pt given [place] sandwich tray and sprite and covered with two warm blankets. Pt appreciative and cooperative at this time. Agrees to use the restroom to collect urine.

## 2022-01-14 NOTE — ED Notes (Signed)
IVC/pending transport to [facility] in the AM

## 2022-01-14 NOTE — ED Notes (Signed)
Pt received breakfast tray and beverage at this time.

## 2022-01-15 NOTE — ED Notes (Signed)
Pt changed into burgundy and blue scrubs after shower. Pts belongings bag now has wallet, pair of socks, jean jacket, pair of shoes, leggings, two shirts, and bra. Pt still has two hair bows (one on wrist and one in hair).

## 2022-01-15 NOTE — ED Notes (Signed)
Pt verbally abusive to staff. Pt in bathroom to take shower and change into IVC clothes

## 2022-01-15 NOTE — ED Notes (Signed)
EMTALA reviewed by this RN and all required documents were completed. Pt is ready for transport.

## 2022-01-15 NOTE — ED Notes (Signed)
Hospital meal provided, pt tolerated w/o complaints.  Waste discarded appropriately.  

## 2022-01-22 ENCOUNTER — Emergency Department
Admission: EM | Admit: 2022-01-22 | Discharge: 2022-01-25 | Disposition: A | Discharge status: Other Acute Inpatient Hospital | Payer: Medicaid Other | Attending: Emergency Medicine | Admitting: Emergency Medicine

## 2022-01-22 ENCOUNTER — Emergency Department: Payer: Medicaid Other

## 2022-01-22 ENCOUNTER — Other Ambulatory Visit: Payer: Self-pay

## 2022-01-22 DIAGNOSIS — X788XXA Intentional self-harm by other sharp object, initial encounter: Secondary | ICD-10-CM | POA: Diagnosis not present

## 2022-01-22 DIAGNOSIS — T71193A Asphyxiation due to mechanical threat to breathing due to other causes, assault, initial encounter: Secondary | ICD-10-CM | POA: Diagnosis not present

## 2022-01-22 DIAGNOSIS — Z1152 Encounter for screening for COVID-19: Secondary | ICD-10-CM | POA: Insufficient documentation

## 2022-01-22 DIAGNOSIS — R45851 Suicidal ideations: Secondary | ICD-10-CM

## 2022-01-22 DIAGNOSIS — T50902A Poisoning by unspecified drugs, medicaments and biological substances, intentional self-harm, initial encounter: Secondary | ICD-10-CM

## 2022-01-22 DIAGNOSIS — T797XXA Traumatic subcutaneous emphysema, initial encounter: Secondary | ICD-10-CM

## 2022-01-22 DIAGNOSIS — F14151 Cocaine abuse with cocaine-induced psychotic disorder with hallucinations: Secondary | ICD-10-CM | POA: Diagnosis not present

## 2022-01-22 LAB — COMPREHENSIVE METABOLIC PANEL
ALT: 18 U/L (ref 0–44)
AST: 30 U/L (ref 15–41)
Albumin: 4.2 g/dL (ref 3.5–5.0)
Alkaline Phosphatase: 71 U/L (ref 38–126)
Anion gap: 9 (ref 5–15)
BUN: 12 mg/dL (ref 6–20)
CO2: 26 mmol/L (ref 22–32)
Calcium: 9.4 mg/dL (ref 8.9–10.3)
Chloride: 102 mmol/L (ref 98–111)
Creatinine, Ser: 0.7 mg/dL (ref 0.44–1.00)
GFR, Estimated: >60 mL/min (ref >60)
Glucose, Bld: 178 mg/dL — ABNORMAL HIGH (ref 70–99)
Potassium: 3.5 mmol/L (ref 3.5–5.1)
Sodium: 137 mmol/L (ref 135–145)
Total Bilirubin: 0.8 mg/dL (ref 0.3–1.2)
Total Protein: 7.9 g/dL (ref 6.5–8.1)

## 2022-01-22 LAB — URINE DRUG SCREEN, QUALITATIVE (ARMC ONLY)
Amphetamines, Ur Screen: NOT DETECTED
Barbiturates, Ur Screen: NOT DETECTED
Benzodiazepine, Ur Scrn: POSITIVE — AB
Cannabinoid 50 Ng, Ur ~~LOC~~: POSITIVE — AB
Cocaine Metabolite,Ur ~~LOC~~: POSITIVE — AB
MDMA (Ecstasy)Ur Screen: NOT DETECTED
Methadone Scn, Ur: NOT DETECTED
Opiate, Ur Screen: NOT DETECTED
Phencyclidine (PCP) Ur S: NOT DETECTED
Tricyclic, Ur Screen: NOT DETECTED

## 2022-01-22 LAB — HCG, QUANTITATIVE, PREGNANCY: hCG, Beta Chain, Quant, S: <1 m[IU]/mL (ref <5)

## 2022-01-22 LAB — CBC WITH DIFFERENTIAL/PLATELET
Abs Immature Granulocytes: 0.01 10*3/uL (ref 0.00–0.07)
Basophils Absolute: 0 10*3/uL (ref 0.0–0.1)
Basophils Relative: 1 %
Eosinophils Absolute: 0.1 10*3/uL (ref 0.0–0.5)
Eosinophils Relative: 1 %
HCT: 44.6 % (ref 36.0–46.0)
Hemoglobin: 13.9 g/dL (ref 12.0–15.0)
Immature Granulocytes: 0 %
Lymphocytes Relative: 19 %
Lymphs Abs: 1.1 10*3/uL (ref 0.7–4.0)
MCH: 27.5 pg (ref 26.0–34.0)
MCHC: 31.2 g/dL (ref 30.0–36.0)
MCV: 88.1 fL (ref 80.0–100.0)
Monocytes Absolute: 0.5 10*3/uL (ref 0.1–1.0)
Monocytes Relative: 9 %
Neutro Abs: 4 10*3/uL (ref 1.7–7.7)
Neutrophils Relative %: 70 %
Platelets: 261 10*3/uL (ref 150–400)
RBC: 5.06 MIL/uL (ref 3.87–5.11)
RDW: 13.2 % (ref 11.5–15.5)
WBC: 5.7 10*3/uL (ref 4.0–10.5)
nRBC: 0 % (ref 0.0–0.2)

## 2022-01-22 LAB — RESP PANEL BY RT-PCR (RSV, FLU A&B, COVID)  RVPGX2
Influenza A by PCR: NEGATIVE
Influenza B by PCR: NEGATIVE
Resp Syncytial Virus by PCR: NEGATIVE
SARS Coronavirus 2 by RT PCR: NEGATIVE

## 2022-01-22 LAB — ACETAMINOPHEN LEVEL: Acetaminophen (Tylenol), Serum: <10 ug/mL — ABNORMAL LOW (ref 10–30)

## 2022-01-22 LAB — LIPASE, BLOOD: Lipase: 55 U/L — ABNORMAL HIGH (ref 11–51)

## 2022-01-22 LAB — ETHANOL: Alcohol, Ethyl (B): <10 mg/dL (ref <10)

## 2022-01-22 LAB — SALICYLATE LEVEL: Salicylate Lvl: <7 mg/dL — ABNORMAL LOW (ref 7.0–30.0)

## 2022-01-22 MED ORDER — RIVAROXABAN 20 MG PO TABS
20.0000 mg | ORAL_TABLET | Freq: Every day | ORAL | Status: DC
  Administered 2022-01-24: 20 mg via ORAL
  Filled 2022-01-22 (×4): qty 1

## 2022-01-22 MED ORDER — DEXAMETHASONE SODIUM PHOSPHATE 10 MG/ML IJ SOLN
10.0000 mg | Freq: Once | INTRAMUSCULAR | Status: AC
  Administered 2022-01-22: 10 mg via INTRAVENOUS
  Filled 2022-01-22: qty 1

## 2022-01-22 MED ORDER — MIRTAZAPINE 15 MG PO TABS
30.0000 mg | ORAL_TABLET | Freq: Every day | ORAL | Status: DC
  Administered 2022-01-24: 30 mg via ORAL
  Filled 2022-01-22: qty 2

## 2022-01-22 MED ORDER — AMLODIPINE BESYLATE 5 MG PO TABS
5.0000 mg | ORAL_TABLET | Freq: Every day | ORAL | Status: DC
  Administered 2022-01-24: 5 mg via ORAL
  Filled 2022-01-22 (×4): qty 1

## 2022-01-22 MED ORDER — CEPHALEXIN 500 MG PO CAPS
500.0000 mg | ORAL_CAPSULE | Freq: Once | ORAL | Status: AC
  Administered 2022-01-22: 500 mg via ORAL
  Filled 2022-01-22: qty 1

## 2022-01-22 MED ORDER — ZIPRASIDONE MESYLATE 20 MG IM SOLR: 20.0000 mg | Freq: Once | INTRAMUSCULAR | Status: DC

## 2022-01-22 MED ORDER — HALOPERIDOL 2 MG PO TABS
2.0000 mg | ORAL_TABLET | Freq: Two times a day (BID) | ORAL | Status: DC
  Administered 2022-01-24 – 2022-01-25 (×3): 2 mg via ORAL
  Filled 2022-01-22 (×7): qty 1

## 2022-01-22 MED ORDER — PANCRELIPASE (LIP-PROT-AMYL) 36000-114000 UNITS PO CPEP
36000.0000 [IU] | ORAL_CAPSULE | Freq: Three times a day (TID) | ORAL | Status: DC
  Administered 2022-01-24 – 2022-01-25 (×3): 36000 [IU] via ORAL
  Filled 2022-01-22 (×11): qty 1

## 2022-01-22 MED ORDER — IOHEXOL 350 MG/ML SOLN
75.0000 mL | Freq: Once | INTRAVENOUS | Status: AC | PRN
  Administered 2022-01-22: 75 mL via INTRAVENOUS

## 2022-01-22 MED ORDER — INSULIN GLARGINE-YFGN 100 UNIT/ML ~~LOC~~ SOLN
6.0000 [IU] | Freq: Every day | SUBCUTANEOUS | Status: DC
  Administered 2022-01-25: 6 [IU] via SUBCUTANEOUS
  Filled 2022-01-22 (×3): qty 0.06

## 2022-01-22 MED ORDER — ONDANSETRON HCL 4 MG/2ML IJ SOLN
4.0000 mg | Freq: Once | INTRAMUSCULAR | Status: AC
  Administered 2022-01-23: 4 mg via INTRAVENOUS
  Filled 2022-01-22: qty 2

## 2022-01-22 MED ORDER — ACETAMINOPHEN 325 MG PO TABS
650.0000 mg | ORAL_TABLET | Freq: Once | ORAL | Status: DC
  Filled 2022-01-22: qty 2

## 2022-01-22 MED ORDER — INSULIN ASPART 100 UNIT/ML IJ SOLN
3.0000 [IU] | Freq: Three times a day (TID) | INTRAMUSCULAR | Status: DC
  Administered 2022-01-25 (×2): 3 [IU] via SUBCUTANEOUS
  Filled 2022-01-22 (×3): qty 1

## 2022-01-22 MED ORDER — CEPHALEXIN 500 MG PO CAPS
500.0000 mg | ORAL_CAPSULE | Freq: Four times a day (QID) | ORAL | Status: DC
  Administered 2022-01-24 – 2022-01-25 (×5): 500 mg via ORAL
  Filled 2022-01-22 (×7): qty 1

## 2022-01-22 MED ORDER — GABAPENTIN 300 MG PO CAPS
300.0000 mg | ORAL_CAPSULE | Freq: Three times a day (TID) | ORAL | Status: DC
  Administered 2022-01-24 – 2022-01-25 (×4): 300 mg via ORAL
  Filled 2022-01-22 (×6): qty 1

## 2022-01-22 MED ORDER — ONDANSETRON 4 MG PO TBDP
4.0000 mg | ORAL_TABLET | Freq: Once | ORAL | Status: AC
  Administered 2022-01-22: 4 mg via ORAL
  Filled 2022-01-22: qty 1
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
## 2022-01-22 NOTE — ED Notes (Signed)
Pt c/o vomiting x3. P.O Zofran given. Pt c/o pain "I have pancreatitis". MD Notified. Pt offered Tylenol but refused.

## 2022-01-22 NOTE — ED Notes (Signed)
This RN to bedside to introduce self to pt as new Therapist, sports. Pt is not currently dressed out per the IVC protocol. This RN will get patient dressed out and into hospital psych  clothes.

## 2022-01-22 NOTE — ED Notes (Signed)
Pt is adamantly refusing to get dressed out at this time. Charge made aware.
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
## 2022-01-22 NOTE — ED Notes (Signed)
Patient assisted to restroom with the help of this RN.

## 2022-01-22 NOTE — Consult Note (Signed)
Client tazed and given agitation medications, will try and assess when alert.  Waylan Boga, PMHNP

## 2022-01-22 NOTE — ED Notes (Addendum)
Patient to Ct with security at this time. Patient unable to complete CT per CT due to needing to use restroom. CT to come back at a later time.

## 2022-01-22 NOTE — ED Notes (Signed)
Pt finally agreed to change clothes.Pt had one striped gown and one hairtie. Pt placed in burgundy scrubs.

## 2022-01-22 NOTE — ED Notes (Signed)
Np and Student at bedside

## 2022-01-22 NOTE — ED Notes (Signed)
Patient resting on stretcher. NAD noted. Respirations even and unlabored.

## 2022-01-22 NOTE — ED Notes (Signed)
Assumed care of patient.

## 2022-01-22 NOTE — ED Notes (Signed)
IVC, psych consult unable to be performed at this time

## 2022-01-22 NOTE — ED Notes (Signed)
Patient back to CT with security.
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
## 2022-01-22 NOTE — ED Notes (Signed)
Pt given dinner tray and water at this time.

## 2022-01-23 ENCOUNTER — Emergency Department: Payer: Medicaid Other

## 2022-01-23 LAB — COMPREHENSIVE METABOLIC PANEL
ALT: 17 U/L (ref 0–44)
AST: 28 U/L (ref 15–41)
Albumin: 4.4 g/dL (ref 3.5–5.0)
Alkaline Phosphatase: 71 U/L (ref 38–126)
Anion gap: 10 (ref 5–15)
BUN: 20 mg/dL (ref 6–20)
CO2: 29 mmol/L (ref 22–32)
Calcium: 9.5 mg/dL (ref 8.9–10.3)
Chloride: 102 mmol/L (ref 98–111)
Creatinine, Ser: 0.79 mg/dL (ref 0.44–1.00)
GFR, Estimated: >60 mL/min (ref >60)
Glucose, Bld: 171 mg/dL — ABNORMAL HIGH (ref 70–99)
Potassium: 4.4 mmol/L (ref 3.5–5.1)
Sodium: 141 mmol/L (ref 135–145)
Total Bilirubin: 1.1 mg/dL (ref 0.3–1.2)
Total Protein: 8.6 g/dL — ABNORMAL HIGH (ref 6.5–8.1)

## 2022-01-23 LAB — CBC WITH DIFFERENTIAL/PLATELET
Abs Immature Granulocytes: 0.03 10*3/uL (ref 0.00–0.07)
Basophils Absolute: 0 10*3/uL (ref 0.0–0.1)
Basophils Relative: 0 %
Eosinophils Absolute: 0 10*3/uL (ref 0.0–0.5)
Eosinophils Relative: 0 %
HCT: 47.6 % — ABNORMAL HIGH (ref 36.0–46.0)
Hemoglobin: 15.1 g/dL — ABNORMAL HIGH (ref 12.0–15.0)
Immature Granulocytes: 0 %
Lymphocytes Relative: 14 %
Lymphs Abs: 1.5 10*3/uL (ref 0.7–4.0)
MCH: 27.4 pg (ref 26.0–34.0)
MCHC: 31.7 g/dL (ref 30.0–36.0)
MCV: 86.4 fL (ref 80.0–100.0)
Monocytes Absolute: 1.2 10*3/uL — ABNORMAL HIGH (ref 0.1–1.0)
Monocytes Relative: 11 %
Neutro Abs: 8.1 10*3/uL — ABNORMAL HIGH (ref 1.7–7.7)
Neutrophils Relative %: 75 %
Platelets: 388 10*3/uL (ref 150–400)
RBC: 5.51 MIL/uL — ABNORMAL HIGH (ref 3.87–5.11)
RDW: 13.4 % (ref 11.5–15.5)
WBC: 10.8 10*3/uL — ABNORMAL HIGH (ref 4.0–10.5)
nRBC: 0 % (ref 0.0–0.2)

## 2022-01-23 LAB — CBG MONITORING, ED
Glucose-Capillary: 200 mg/dL — ABNORMAL HIGH (ref 70–99)
Glucose-Capillary: 227 mg/dL — ABNORMAL HIGH (ref 70–99)

## 2022-01-23 LAB — LIPASE, BLOOD: Lipase: 36 U/L (ref 11–51)

## 2022-01-23 MED ORDER — MORPHINE SULFATE (PF) 4 MG/ML IV SOLN
4.0000 mg | INTRAVENOUS | Status: DC | PRN
  Administered 2022-01-23 – 2022-01-24 (×4): 4 mg via INTRAVENOUS
  Filled 2022-01-23 (×4): qty 1

## 2022-01-23 MED ORDER — ONDANSETRON HCL 4 MG/2ML IJ SOLN: 4.0000 mg | Freq: Once | INTRAMUSCULAR | Status: DC

## 2022-01-23 MED ORDER — IOHEXOL 300 MG/ML  SOLN
100.0000 mL | Freq: Once | INTRAMUSCULAR | Status: AC | PRN
  Administered 2022-01-23: 100 mL via INTRAVENOUS

## 2022-01-23 MED ORDER — ONDANSETRON 4 MG PO TBDP
4.0000 mg | ORAL_TABLET | Freq: Three times a day (TID) | ORAL | Status: DC | PRN
  Administered 2022-01-23: 4 mg via ORAL
  Filled 2022-01-23 (×2): qty 1

## 2022-01-23 MED ORDER — SODIUM CHLORIDE 0.9 % IV BOLUS
1000.0000 mL | Freq: Once | INTRAVENOUS | Status: AC
  Administered 2022-01-23: 1000 mL via INTRAVENOUS
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
## 2022-01-23 NOTE — ED Notes (Signed)
Pt vomiting off side of stretcher onto floor. C/o continued abd discomfort and states its due to her pancreatitis. MD aware of pt not eating and continued vomiting today. Pt remains non-compliant with medications ordered by MD.

## 2022-01-23 NOTE — ED Notes (Signed)
Patient sleep at time of snack pass. Patient reported also vomitting multiple times.

## 2022-01-23 NOTE — ED Notes (Signed)
Behavioral med NP at the bedside with TTS for pt evaluation

## 2022-01-23 NOTE — ED Provider Notes (Signed)
Was notified by nurse that patient was refusing to take any medications complaining of abdominal pain having vomiting.  Given her history of pancreatitis blood work was repeated.  Her emesis is none bloody no coffee-ground emesis.  Patient somewhat uncooperative exam for CT imaging ordered to further evaluate.  CT imaging without evidence of acute finding.  Repeat lipase is normal.  Mild leukocytosis but blood work does appear hemoconcentrated.  Given IV fluids.  Question whether this could be related to possible narcotic withdrawal.  She has not had any further vomiting since receiving dose of Zofran and morphine.  She is resting comfortably.  Do not see medical indication for admission to the hospital at this time.   Merlyn Lot, MD 01/23/22 [phone]

## 2022-01-23 NOTE — ED Notes (Signed)
Pt is continuing to vomit on the floor and refusing to use emesis bag stating " it doesn't work and leave me alone". Writer placed two towels on the floor to catch some of the vomit.

## 2022-01-23 NOTE — ED Notes (Signed)
Provided pt meal; pt refused.

## 2022-01-23 NOTE — ED Notes (Signed)
Pt had emesis episode in room. Vomit was cleaned up from the floor. Pt refusing to let writer change bedding.

## 2022-01-23 NOTE — ED Notes (Addendum)
Pt given snack and water to drink; pt refused but items placed at end of bed to ensure she would have them in case pt changes her mind.
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
## 2022-01-23 NOTE — ED Notes (Signed)
Pt ambulated to bathroom 

## 2022-01-23 NOTE — ED Notes (Signed)
Pts dinner tray and drink at bedside, pt currently sleeping.

## 2022-01-23 NOTE — ED Notes (Signed)
This Probation officer along with [facility] security and CT tech took patient to [place] room.

## 2022-01-23 NOTE — ED Notes (Signed)
IVC/Consult to be re-attempted in Am

## 2022-01-24 DIAGNOSIS — F14151 Cocaine abuse with cocaine-induced psychotic disorder with hallucinations: Secondary | ICD-10-CM | POA: Diagnosis not present

## 2022-01-24 LAB — CBG MONITORING, ED
Glucose-Capillary: 88 mg/dL (ref 70–99)
Glucose-Capillary: 131 mg/dL — ABNORMAL HIGH (ref 70–99)
Glucose-Capillary: 93 mg/dL (ref 70–99)

## 2022-01-24 MED ORDER — OXYCODONE-ACETAMINOPHEN 5-325 MG PO TABS
1.0000 | ORAL_TABLET | Freq: Four times a day (QID) | ORAL | Status: DC | PRN
  Administered 2022-01-25: 1 via ORAL
  Filled 2022-01-24: qty 1

## 2022-01-24 MED ORDER — FLUCONAZOLE 50 MG PO TABS
150.0000 mg | ORAL_TABLET | Freq: Once | ORAL | Status: AC
  Administered 2022-01-24: 150 mg via ORAL
  Filled 2022-01-24: qty 1

## 2022-01-24 NOTE — ED Notes (Signed)
ivc/consult done/daily contact with patient to assess and evaluate symptoms & progress in treatment/medication management.

## 2022-01-24 NOTE — ED Notes (Signed)
Patient awake and asking for some ginger ale. Patient given diet ginger ale along with keflex.  Patient refusing to go to restroom. Patient has odor of urine but states she does not need to go.

## 2022-01-24 NOTE — ED Notes (Signed)
INVOLUNTARY continues to await dispo

## 2022-01-24 NOTE — ED Notes (Signed)
Pt given breakfast tray- will not speak to staff at this time. Moves blanket over head when staff attempts to interact.

## 2022-01-24 NOTE — Consult Note (Signed)
[facility] ED Progress Note  01/24/2022 7:14 PM Amy Wolfe  MRN:  [id number]   Method of visit?: Face to Face   Subjective: "I am feeling better."  On evaluation, patient looks physically better.  She has not been vomiting and is alert and oriented x 4.  Speaks in clear, coherent sentences.  She is still very depressed, tearful.  She states "what am I going to do?"  She is in a precarious situation with her health and homelessness.  She reports that she is currently able to stay with her mother.  She states that she puts some cocaine or marijuana sometimes to to help with her pain because the choices for treatment of her pancreatitis are minimal.  She reports being in "constant pain."  She denies frank suicidal ideation at this time, but she remains very tearful and depressed, hopeless.  Psychiatric hospitalization continues to be warranted at this time due to her depression and medical condition.      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 ([place])  Total Time spent with patient: 20 minutes  Past Psychiatric History: See previous  Past Medical History:  Past Medical History:  Diagnosis Date   Abdominal pain    Anxiety    Coronary artery disease    Diabetes mellitus without complication (HCC)    Dyspnea    GERD (gastroesophageal reflux disease)    H/O blood clots    Hypertension    Liver abscess    Pancreatitis 2019   PONV (postoperative nausea and vomiting) 11/07/2018   Thyroid disease    Uterine fibroid     Past Surgical History:  Procedure Laterality Date   BILIARY BRUSHING  08/22/2021   Procedure: BILIARY BRUSHING;  Surgeon: Irving Copas., MD;  Location: [facility];  Service: Gastroenterology;;   BIOPSY  06/01/2021   Procedure: BIOPSY;  Surgeon: Irving Copas., MD;  Location: [facility];  Service: Gastroenterology;;   CESAREAN SECTION     EMBOLIZATION N/A 06/14/2018   Procedure: Uterine Embolization;  Surgeon: Algernon Huxley, MD;  Location: [facility];  Service: Cardiovascular;  Laterality: N/A;   ENDOSCOPIC RETROGRADE CHOLANGIOPANCREATOGRAPHY (ERCP) WITH PROPOFOL N/A 08/22/2021   Procedure: ENDOSCOPIC RETROGRADE CHOLANGIOPANCREATOGRAPHY (ERCP) WITH PROPOFOL;  Surgeon: Rush Landmark Telford Nab., MD;  Location: [facility];  Service: Gastroenterology;  Laterality: N/A;   ESOPHAGOGASTRODUODENOSCOPY (EGD) WITH PROPOFOL N/A 06/01/2021   Procedure: ESOPHAGOGASTRODUODENOSCOPY (EGD) WITH PROPOFOL;  Surgeon: Rush Landmark Telford Nab., MD;  Location: [facility];  Service: Gastroenterology;  Laterality: N/A;   ESOPHAGOGASTRODUODENOSCOPY (EGD) WITH PROPOFOL N/A 08/22/2021   Procedure: ESOPHAGOGASTRODUODENOSCOPY (EGD) WITH PROPOFOL;  Surgeon: Rush Landmark Telford Nab., MD;  Location: [facility];  Service: Gastroenterology;  Laterality: N/A;   EUS N/A 06/01/2021   Procedure: UPPER ENDOSCOPIC ULTRASOUND (EUS) RADIAL;  Surgeon: Irving Copas., MD;  Location: [facility];  Service: Gastroenterology;  Laterality: N/A;   EUS N/A 08/22/2021   Procedure: ESOPHAGEAL ENDOSCOPIC ULTRASOUND (EUS) RADIAL;  Surgeon: Rush Landmark Telford Nab., MD;  Location: [facility];  Service: Gastroenterology;  Laterality: N/A;   laceration finger Right    LAPAROSCOPIC VAGINAL HYSTERECTOMY WITH SALPINGECTOMY Bilateral 10/28/2018   Procedure: LAPAROSCOPIC ASSISTED VAGINAL HYSTERECTOMY BILATERAL WITH SALPINGECTOMY;  Surgeon: Rubie Maid, MD;  Location: [facility];  Service: Gynecology;  Laterality: Bilateral;   NEUROLYTIC CELIAC PLEXUS  08/22/2021   Procedure: NEUROLYTIC CELIAC PLEXUS;  Surgeon: Rush Landmark Telford Nab., MD;  Location:  [facility];  Service: Gastroenterology;;   PANCREATIC STENT PLACEMENT  08/22/2021   Procedure: PANCREATIC STENT PLACEMENT;  Surgeon: Rush Landmark Telford Nab., MD;   Location: [facility];  Service: Gastroenterology;;   REMOVAL OF STONES  08/22/2021   Procedure: REMOVAL OF STONES;  Surgeon: Irving Copas., MD;  Location: [facility];  Service: Gastroenterology;;   [name]  08/22/2021   Procedure: [name];  Surgeon: Irving Copas., MD;  Location: [facility];  Service: Gastroenterology;;   THYROIDECTOMY, PARTIAL     VAGINAL HYSTERECTOMY Bilateral 10/28/2018   Procedure: HYSTERECTOMY VAGINAL WITH BILATERAL SALPINGECTOMY;  Surgeon: Rubie Maid, MD;  Location: [facility];  Service: Gynecology;  Laterality: Bilateral;   Family History:  Family History  Problem Relation Age of Onset   Hypertension Mother    Diabetes Mother    Brain cancer Father    Aneurysm Brother    Bone cancer Maternal Grandmother    Lung cancer Maternal Grandfather    Cancer Paternal Grandmother    Cancer Paternal Grandfather    Colon cancer Neg Hx    Esophageal cancer Neg Hx    Inflammatory bowel disease Neg Hx    Liver disease Neg Hx    Pancreatic cancer Neg Hx    Rectal cancer Neg Hx    Stomach cancer Neg Hx    Family Psychiatric  History: See previous Social History:  Social History   Substance and Sexual Activity  Alcohol Use Not Currently     Social History   Substance and Sexual Activity  Drug Use Yes   Types: Marijuana   Comment: daily    Social History   Socioeconomic History   Marital status: Single    Spouse name: Not on file   Number of children: 2   Years of education: 12   Highest education level: Associate degree: academic program  Occupational History   Occupation: [employer]  Tobacco Use   Smoking status: Every Day    Packs/day: 0.50    Years: 35.00    Total pack years: 17.50    Types: Cigarettes   Smokeless tobacco: Never  Vaping Use   Vaping Use: Never used  Substance and Sexual Activity   Alcohol use: Not Currently   Drug use: Yes    Types: Marijuana    Comment: daily   Sexual activity: Not Currently     Birth control/protection: Surgical  Other Topics Concern   Not on file  Social History Narrative   Not on file   Social Determinants of Health   Financial Resource Strain: Low Risk  (10/04/2018)   Overall Financial Resource Strain (CARDIA)    Difficulty of Paying Living Expenses: Not hard at all  Food Insecurity: No Food Insecurity (11/03/2021)   Hunger Vital Sign    Worried About Running Out of Food in the Last Year: Never true    Ran Out of Food in the Last Year: Never true  Transportation Needs: Unmet Transportation Needs (11/03/2021)   PRAPARE - Transportation    Lack of Transportation (Medical): Yes    Lack of Transportation (Non-Medical): Yes  Physical Activity: Inactive (10/04/2018)   Exercise Vital Sign    Days of Exercise per Week: 0 days    Minutes of Exercise per Session: 0 min  Stress: No Stress Concern Present (10/04/2018)   [place]    Feeling of Stress : Only a little  Social Connections: Unknown (10/04/2018)   Social Connection and Isolation Panel [NHANES]  Frequency of Communication with Friends and Family: More than three times a week    Frequency of Social Gatherings with Friends and Family: More than three times a week    Attends Religious Services: Never    Marine scientist or Organizations: No    Attends Music therapist: Never    Marital Status: Not on file    Sleep: Fair  Appetite:  Fair  Current Medications: Current Facility-Administered Medications  Medication Dose Route Frequency Provider Last Rate Last Admin   acetaminophen (TYLENOL) tablet 650 mg  650 mg Oral Once Merlyn Lot, MD       amLODipine (NORVASC) tablet 5 mg  5 mg Oral Daily Blake Divine, MD   5 mg at 01/24/22 1016   cephALEXin (KEFLEX) capsule 500 mg  500 mg Oral Q6H Blake Divine, MD   500 mg at 01/24/22 1757   gabapentin (NEURONTIN) capsule 300 mg  300 mg Oral TID Patrecia Pour, NP    300 mg at 01/24/22 1757   haloperidol (HALDOL) tablet 2 mg  2 mg Oral BID Patrecia Pour, NP   2 mg at 01/24/22 1017   insulin aspart (novoLOG) injection 3 Units  3 Units Subcutaneous TID WC Blake Divine, MD       insulin glargine-yfgn [facility]) injection 6 Units  6 Units Subcutaneous Daily Blake Divine, MD       lipase/protease/amylase (CREON) capsule 36,000 Units  36,000 Units Oral TID WC Blake Divine, MD   36,000 Units at 01/24/22 1757   mirtazapine (REMERON) tablet 30 mg  30 mg Oral QHS Blake Divine, MD       morphine (PF) 4 MG/ML injection 4 mg  4 mg Intravenous Q3H PRN Merlyn Lot, MD   4 mg at 01/24/22 1810   ondansetron (ZOFRAN) injection 4 mg  4 mg Intravenous Once Merlyn Lot, MD       ondansetron (ZOFRAN-ODT) disintegrating tablet 4 mg  4 mg Oral Q8H PRN Rada Hay, MD   4 mg at 01/23/22 0555   rivaroxaban (XARELTO) tablet 20 mg  20 mg Oral Q supper Blake Divine, MD   20 mg at 01/24/22 1757   ziprasidone (GEODON) injection 20 mg  20 mg Intramuscular Once Merlyn Lot, MD       Current Outpatient Medications  Medication Sig Dispense Refill   amLODipine (NORVASC) 5 MG tablet Take one tablet by mouth daily (Patient taking differently: Take 5 mg by mouth daily.) 30 tablet 1   insulin aspart (NOVOLOG) 100 UNIT/ML FlexPen Inject 3 units into the skin 3 times a day with meals 15 mL 1   insulin glargine-yfgn (SEMGLEE) 100 UNIT/ML Pen Inject 0.6 ml into the skin daily 15 mL 1   lipase/protease/amylase (CREON) 36000 UNITS CPEP capsule Take 2 capsules by mouth 3 times daily with meals 180 capsule 1   Multiple Vitamin (MULTIVITAMIN WITH MINERALS) TABS tablet Take 1 tablet by mouth daily. 30 tablet 0   oxyCODONE-acetaminophen (PERCOCET/ROXICET) 5-325 MG tablet Take 1-2 tablets by mouth 3 (three) times daily before meals. 120 tablet 0   pantoprazole (PROTONIX) 40 MG tablet Take one tablet by mouth daily 30 tablet 1   rivaroxaban (XARELTO) 20 MG TABS tablet  Take one tablet by mouth daily with supper 30 tablet 1   Accu-Chek Softclix Lancets lancets USE TO CHECK BLOOD SUGAR UP TO 4 TIMES DAILY AS DIRECTED 100 each 2   amLODipine (NORVASC) 5 MG tablet Take 1 tablet (5 mg total) by  mouth daily. 30 tablet 0   blood glucose meter kit and supplies KIT Dispense based on patient and insurance preference. Use up to four times daily as directed. 1 each 0   blood glucose meter kit and supplies Dispense based on patient and insurance preference. Use up to four times daily as directed. (FOR ICD-10 E10.9, E11.9). 1 each 0   cephALEXin (KEFLEX) 500 MG capsule Take 1 capsule (500 mg total) by mouth 2 (two) times daily. 14 capsule 0   docusate sodium (COLACE) 100 MG capsule Take 1 capsule (100 mg total) by mouth 2 (two) times daily. 60 capsule 0   glucose blood (ACCU-CHEK GUIDE) test strip USE TO CHECK BLOOD SUGAR UP TO 4 TIMES DAILY AS DIRECTED 100 each 2   insulin aspart (NOVOLOG FLEXPEN) 100 UNIT/ML FlexPen Inject 3 Units into the skin 3 (three) times daily with meals. 15 mL 0   insulin glargine-yfgn (SEMGLEE) 100 UNIT/ML injection Inject 0.06 mLs (6 Units total) into the skin daily. 10 mL 0   Insulin Pen Needle (PEN NEEDLES) 30G X 8 MM MISC 1 each by Does not apply route daily. 90 each 3   Insulin Pen Needle 31G X 5 MM MISC use with levemir flexpen daily 100 each 3   Insulin Pen Needle 32G X 4 MM MISC use with novolog flexpen 100 each 0   mirtazapine (REMERON) 30 MG tablet take one tablet by mouth at bedtime (Patient not taking: Reported on 01/24/2022) 30 tablet 1   rivaroxaban (XARELTO) 20 MG TABS tablet Take 1 tablet (20 mg total) by mouth daily with supper. 30 tablet 0    Lab Results:  Results for orders placed or performed during the hospital encounter of 01/22/22 (from the past 48 hour(s))  CBG monitoring, ED     Status: Abnormal   Collection Time: 01/23/22 10:28 AM  Result Value Ref Range   Glucose-Capillary 200 (H) 70 - 99 mg/dL    Comment: Glucose  reference range applies only to samples taken after fasting for at least 8 hours.  CBG monitoring, ED     Status: Abnormal   Collection Time: 01/23/22 11:47 AM  Result Value Ref Range   Glucose-Capillary 227 (H) 70 - 99 mg/dL    Comment: Glucose reference range applies only to samples taken after fasting for at least 8 hours.  CBC with Differential     Status: Abnormal   Collection Time: 01/23/22  6:01 PM  Result Value Ref Range   WBC 10.8 (H) 4.0 - 10.5 K/uL   RBC 5.51 (H) 3.87 - 5.11 MIL/uL   Hemoglobin 15.1 (H) 12.0 - 15.0 g/dL   HCT 47.6 (H) 36.0 - 46.0 %   MCV 86.4 80.0 - 100.0 fL   MCH 27.4 26.0 - 34.0 pg   MCHC 31.7 30.0 - 36.0 g/dL   RDW 13.4 11.5 - 15.5 %   Platelets 388 150 - 400 K/uL   nRBC 0.0 0.0 - 0.2 %   Neutrophils Relative % 75 %   Neutro Abs 8.1 (H) 1.7 - 7.7 K/uL   Lymphocytes Relative 14 %   Lymphs Abs 1.5 0.7 - 4.0 K/uL   Monocytes Relative 11 %   Monocytes Absolute 1.2 (H) 0.1 - 1.0 K/uL   Eosinophils Relative 0 %   Eosinophils Absolute 0.0 0.0 - 0.5 K/uL   Basophils Relative 0 %   Basophils Absolute 0.0 0.0 - 0.1 K/uL   Immature Granulocytes 0 %   Abs Immature Granulocytes 0.03 0.00 -  0.07 K/uL    Comment: Performed at [facility], [address] [place], [place] [address]  Lipase, blood     Status: None   Collection Time: 01/23/22  6:01 PM  Result Value Ref Range   Lipase 36 11 - 51 U/L    Comment: Performed at [facility], [address] [place], [place] [address]  Comprehensive metabolic panel     Status: Abnormal   Collection Time: 01/23/22  6:01 PM  Result Value Ref Range   Sodium 141 135 - 145 mmol/L   Potassium 4.4 3.5 - 5.1 mmol/L    Comment: HEMOLYSIS AT THIS LEVEL MAY AFFECT RESULT   Chloride 102 98 - 111 mmol/L   CO2 29 22 - 32 mmol/L   Glucose, Bld 171 (H) 70 - 99 mg/dL    Comment: Glucose reference range applies only to samples taken after fasting for at least 8 hours.   BUN 20 6 - 20 mg/dL   Creatinine, Ser  0.79 0.44 - 1.00 mg/dL   Calcium 9.5 8.9 - 10.3 mg/dL   Total Protein 8.6 (H) 6.5 - 8.1 g/dL   Albumin 4.4 3.5 - 5.0 g/dL   AST 28 15 - 41 U/L    Comment: HEMOLYSIS AT THIS LEVEL MAY AFFECT RESULT   ALT 17 0 - 44 U/L    Comment: HEMOLYSIS AT THIS LEVEL MAY AFFECT RESULT   Alkaline Phosphatase 71 38 - 126 U/L   Total Bilirubin 1.1 0.3 - 1.2 mg/dL    Comment: HEMOLYSIS AT THIS LEVEL MAY AFFECT RESULT   GFR, Estimated >60 >60 mL/min    Comment: (NOTE) Calculated using the CKD-EPI Creatinine Equation (2021)    Anion gap 10 5 - 15    Comment: Performed at [facility], [address] [place], [place] [address]  CBG monitoring, ED     Status: None   Collection Time: 01/24/22  9:21 AM  Result Value Ref Range   Glucose-Capillary 88 70 - 99 mg/dL    Comment: Glucose reference range applies only to samples taken after fasting for at least 8 hours.   Comment 1 Notify RN    Comment 2 Document in Chart   CBG monitoring, ED     Status: Abnormal   Collection Time: 01/24/22 11:38 AM  Result Value Ref Range   Glucose-Capillary 131 (H) 70 - 99 mg/dL    Comment: Glucose reference range applies only to samples taken after fasting for at least 8 hours.  CBG monitoring, ED     Status: None   Collection Time: 01/24/22  5:17 PM  Result Value Ref Range   Glucose-Capillary 93 70 - 99 mg/dL    Comment: Glucose reference range applies only to samples taken after fasting for at least 8 hours.    Blood Alcohol level:  Lab Results  Component Value Date   ETH <10 01/22/2022   ETH <10 01/14/2022    Physical Findings: AIMS:  , ,  ,  ,    CIWA:    COWS:     Musculoskeletal: Strength & Muscle Tone: within normal limits Gait & Station: normal Patient leans: N/A  Psychiatric Specialty Exam:  Presentation  General Appearance:  Appropriate for Environment  Eye Contact: Good  Speech: Clear and Coherent  Speech Volume: Normal  Handedness: Right   Mood and Affect   Mood: Depressed  Affect: Congruent; Blunt   Thought Process  Thought Processes: Coherent  Descriptions of Associations:Intact  Orientation:Full (Time, Place  and Person)  Thought Content:WDL  History of Schizophrenia/Schizoaffective disorder:No data recorded Duration of Psychotic Symptoms:No data recorded Hallucinations:Hallucinations: None  Ideas of Reference:None  Suicidal Thoughts:Suicidal Thoughts: No  Homicidal Thoughts:Homicidal Thoughts: No   Sensorium  Memory: Immediate Good  Judgment: Poor  Insight: Poor   Executive Functions  Concentration: Fair  Attention Span: Fair  Recall: [place] of Knowledge: Fair  Language: Fair   Psychomotor Activity  Psychomotor Activity: Psychomotor Activity: Normal   Assets  Assets: Housing; Catering manager; Social Support   Sleep  Sleep: Sleep: Fair    Physical Exam: Physical Exam HENT:     Head: Normocephalic.     Nose: No congestion or rhinorrhea.  Cardiovascular:     Rate and Rhythm: Normal rate.  Pulmonary:     Effort: Pulmonary effort is normal.  Musculoskeletal:        General: Normal range of motion.     Cervical back: Normal range of motion.  Skin:    General: Skin is dry.  Neurological:     Mental Status: She is alert and oriented to person, place, and time.  Psychiatric:        Attention and Perception: Attention normal.        Mood and Affect: Affect is blunt and tearful.        Speech: Speech normal.        Behavior: Behavior normal.        Thought Content: Thought content normal.        Cognition and Memory: Cognition normal.        Judgment: Judgment is impulsive.    Review of Systems  Respiratory: Negative.    Cardiovascular: Negative.   Musculoskeletal: Negative.   Psychiatric/Behavioral:  Positive for depression, substance abuse and suicidal ideas (Passive). Negative for hallucinations and memory loss. The patient is not nervous/anxious and  does not have insomnia.    Blood pressure 109/76, pulse 65, temperature 98 F (36.7 C), temperature source Oral, resp. rate 16, last menstrual period 09/30/2018, SpO2 95 %. There is no height or weight on file to calculate BMI.  Treatment Plan Summary: Daily contact with patient to assess and evaluate symptoms and progress in treatment, Medication management, and Plan : Pursue inpatient psychiatric hospitalization.  Reviewed with EDP  Sherlon Handing, NP 01/24/2022, 7:14 PM
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
## 2022-01-24 NOTE — ED Notes (Signed)
Hospital meal provided, pt tolerated w/o complaints.  Waste discarded appropriately This NT offered shower Pt denied shower.

## 2022-01-24 NOTE — ED Notes (Signed)
Pt ambulatory to bathroom

## 2022-01-24 NOTE — ED Notes (Signed)
Pt eating small amount of diet tray and ginger ale

## 2022-01-24 NOTE — ED Notes (Signed)
Lunch tray given to pt.

## 2022-01-25 ENCOUNTER — Other Ambulatory Visit: Payer: Self-pay

## 2022-01-25 ENCOUNTER — Encounter (HOSPITAL_COMMUNITY): Payer: Self-pay | Admitting: Psychiatry

## 2022-01-25 ENCOUNTER — Inpatient Hospital Stay (HOSPITAL_COMMUNITY)
Admission: AD | Admit: 2022-01-25 | Discharge: 2022-01-31 | DRG: 885 | Disposition: A | Discharge status: Home / Self Care | Payer: Medicaid Other | Source: Intra-hospital | Attending: Psychiatry | Admitting: Psychiatry

## 2022-01-25 DIAGNOSIS — F1220 Cannabis dependence, uncomplicated: Secondary | ICD-10-CM | POA: Diagnosis present

## 2022-01-25 DIAGNOSIS — T71193A Asphyxiation due to mechanical threat to breathing due to other causes, assault, initial encounter: Secondary | ICD-10-CM | POA: Diagnosis not present

## 2022-01-25 DIAGNOSIS — F419 Anxiety disorder, unspecified: Secondary | ICD-10-CM | POA: Diagnosis present

## 2022-01-25 DIAGNOSIS — Y9289 Other specified places as the place of occurrence of the external cause: Secondary | ICD-10-CM

## 2022-01-25 DIAGNOSIS — F1010 Alcohol abuse, uncomplicated: Secondary | ICD-10-CM | POA: Diagnosis present

## 2022-01-25 DIAGNOSIS — F29 Unspecified psychosis not due to a substance or known physiological condition: Secondary | ICD-10-CM | POA: Diagnosis present

## 2022-01-25 DIAGNOSIS — K861 Other chronic pancreatitis: Secondary | ICD-10-CM | POA: Diagnosis present

## 2022-01-25 DIAGNOSIS — G4700 Insomnia, unspecified: Secondary | ICD-10-CM | POA: Diagnosis present

## 2022-01-25 DIAGNOSIS — I2510 Atherosclerotic heart disease of native coronary artery without angina pectoris: Secondary | ICD-10-CM | POA: Diagnosis present

## 2022-01-25 DIAGNOSIS — E0865 Diabetes mellitus due to underlying condition with hyperglycemia: Secondary | ICD-10-CM | POA: Diagnosis present

## 2022-01-25 DIAGNOSIS — Z833 Family history of diabetes mellitus: Secondary | ICD-10-CM

## 2022-01-25 DIAGNOSIS — R45851 Suicidal ideations: Secondary | ICD-10-CM | POA: Diagnosis present

## 2022-01-25 DIAGNOSIS — Z8249 Family history of ischemic heart disease and other diseases of the circulatory system: Secondary | ICD-10-CM

## 2022-01-25 DIAGNOSIS — K219 Gastro-esophageal reflux disease without esophagitis: Secondary | ICD-10-CM | POA: Diagnosis present

## 2022-01-25 DIAGNOSIS — I10 Essential (primary) hypertension: Secondary | ICD-10-CM | POA: Diagnosis present

## 2022-01-25 DIAGNOSIS — Z794 Long term (current) use of insulin: Secondary | ICD-10-CM | POA: Diagnosis not present

## 2022-01-25 DIAGNOSIS — Z79899 Other long term (current) drug therapy: Secondary | ICD-10-CM | POA: Diagnosis not present

## 2022-01-25 DIAGNOSIS — E079 Disorder of thyroid, unspecified: Secondary | ICD-10-CM | POA: Diagnosis present

## 2022-01-25 DIAGNOSIS — F332 Major depressive disorder, recurrent severe without psychotic features: Principal | ICD-10-CM | POA: Diagnosis present

## 2022-01-25 DIAGNOSIS — Z9151 Personal history of suicidal behavior: Secondary | ICD-10-CM | POA: Diagnosis not present

## 2022-01-25 DIAGNOSIS — N390 Urinary tract infection, site not specified: Secondary | ICD-10-CM | POA: Diagnosis present

## 2022-01-25 DIAGNOSIS — F1410 Cocaine abuse, uncomplicated: Secondary | ICD-10-CM | POA: Diagnosis present

## 2022-01-25 DIAGNOSIS — Z818 Family history of other mental and behavioral disorders: Secondary | ICD-10-CM

## 2022-01-25 DIAGNOSIS — Z5900 Homelessness unspecified: Secondary | ICD-10-CM | POA: Diagnosis not present

## 2022-01-25 LAB — RESP PANEL BY RT-PCR (RSV, FLU A&B, COVID)  RVPGX2
Influenza A by PCR: NEGATIVE
Influenza B by PCR: NEGATIVE
Resp Syncytial Virus by PCR: NEGATIVE
SARS Coronavirus 2 by RT PCR: NEGATIVE

## 2022-01-25 LAB — GLUCOSE, CAPILLARY
Glucose-Capillary: 189 mg/dL — ABNORMAL HIGH (ref 70–99)
Glucose-Capillary: 97 mg/dL (ref 70–99)

## 2022-01-25 LAB — CBG MONITORING, ED
Glucose-Capillary: 114 mg/dL — ABNORMAL HIGH (ref 70–99)
Glucose-Capillary: 183 mg/dL — ABNORMAL HIGH (ref 70–99)

## 2022-01-25 MED ORDER — INSULIN GLARGINE-YFGN 100 UNIT/ML ~~LOC~~ SOLN
6.0000 [IU] | Freq: Every day | SUBCUTANEOUS | Status: DC
  Administered 2022-01-26 – 2022-01-31 (×6): 6 [IU] via SUBCUTANEOUS

## 2022-01-25 MED ORDER — PANCRELIPASE (LIP-PROT-AMYL) 12000-38000 UNITS PO CPEP
36000.0000 [IU] | ORAL_CAPSULE | Freq: Three times a day (TID) | ORAL | Status: DC
  Administered 2022-01-25 – 2022-01-31 (×16): 36000 [IU] via ORAL
  Filled 2022-01-25 (×4): qty 1
  Filled 2022-01-25: qty 3
  Filled 2022-01-25 (×2): qty 1
  Filled 2022-01-25 (×2): qty 3
  Filled 2022-01-25: qty 1
  Filled 2022-01-25: qty 3
  Filled 2022-01-25 (×3): qty 1
  Filled 2022-01-25 (×3): qty 3
  Filled 2022-01-25: qty 1
  Filled 2022-01-25 (×4): qty 3
  Filled 2022-01-25: qty 1
  Filled 2022-01-25: qty 3

## 2022-01-25 MED ORDER — INSULIN ASPART 100 UNIT/ML IJ SOLN
0.0000 [IU] | Freq: Every day | INTRAMUSCULAR | Status: DC
  Administered 2022-01-26: 2 [IU] via SUBCUTANEOUS
  Administered 2022-01-29: 3 [IU] via SUBCUTANEOUS

## 2022-01-25 MED ORDER — ALUM & MAG HYDROXIDE-SIMETH 200-200-20 MG/5ML PO SUSP: 30.0000 mL | ORAL | Status: DC | PRN

## 2022-01-25 MED ORDER — TRAZODONE HCL 50 MG PO TABS
50.0000 mg | ORAL_TABLET | Freq: Every evening | ORAL | Status: DC | PRN
  Administered 2022-01-25: 50 mg via ORAL
  Filled 2022-01-25: qty 1

## 2022-01-25 MED ORDER — LORAZEPAM 1 MG PO TABS: 1.0000 mg | ORAL_TABLET | ORAL | Status: DC | PRN

## 2022-01-25 MED ORDER — INSULIN ASPART 100 UNIT/ML IJ SOLN: 0.0000 [IU] | Freq: Three times a day (TID) | INTRAMUSCULAR | Status: DC

## 2022-01-25 MED ORDER — INSULIN ASPART 100 UNIT/ML IJ SOLN
3.0000 [IU] | Freq: Three times a day (TID) | INTRAMUSCULAR | Status: DC
  Administered 2022-01-25 – 2022-01-31 (×15): 3 [IU] via SUBCUTANEOUS

## 2022-01-25 MED ORDER — OLANZAPINE 5 MG PO TBDP: 5.0000 mg | ORAL_TABLET | Freq: Three times a day (TID) | ORAL | Status: DC | PRN

## 2022-01-25 MED ORDER — INSULIN ASPART 100 UNIT/ML IJ SOLN: 3.0000 [IU] | Freq: Three times a day (TID) | INTRAMUSCULAR | Status: DC

## 2022-01-25 MED ORDER — INSULIN ASPART 100 UNIT/ML IJ SOLN
0.0000 [IU] | Freq: Three times a day (TID) | INTRAMUSCULAR | Status: DC
  Administered 2022-01-25: 2 [IU] via SUBCUTANEOUS
  Administered 2022-01-26: 1 [IU] via SUBCUTANEOUS
  Administered 2022-01-26: 2 [IU] via SUBCUTANEOUS
  Administered 2022-01-27: 1 [IU] via SUBCUTANEOUS
  Administered 2022-01-27 – 2022-01-28 (×3): 2 [IU] via SUBCUTANEOUS
  Administered 2022-01-28: 1 [IU] via SUBCUTANEOUS
  Administered 2022-01-29: 2 [IU] via SUBCUTANEOUS
  Administered 2022-01-29: 5 [IU] via SUBCUTANEOUS
  Administered 2022-01-29: 3 [IU] via SUBCUTANEOUS
  Administered 2022-01-30 (×2): 2 [IU] via SUBCUTANEOUS
  Administered 2022-01-30: 3 [IU] via SUBCUTANEOUS
  Administered 2022-01-31: 2 [IU] via SUBCUTANEOUS

## 2022-01-25 MED ORDER — MAGNESIUM HYDROXIDE 400 MG/5ML PO SUSP
30.0000 mL | Freq: Every day | ORAL | Status: DC | PRN
  Administered 2022-01-27: 30 mL via ORAL
  Filled 2022-01-25: qty 30

## 2022-01-25 MED ORDER — HYDROXYZINE HCL 25 MG PO TABS
25.0000 mg | ORAL_TABLET | Freq: Three times a day (TID) | ORAL | Status: DC | PRN
  Administered 2022-01-25: 25 mg via ORAL
  Filled 2022-01-25: qty 1

## 2022-01-25 MED ORDER — ACETAMINOPHEN 325 MG PO TABS
650.0000 mg | ORAL_TABLET | Freq: Four times a day (QID) | ORAL | Status: DC | PRN
  Administered 2022-01-25 – 2022-01-28 (×4): 650 mg via ORAL
  Filled 2022-01-25 (×4): qty 2

## 2022-01-25 MED ORDER — ZIPRASIDONE MESYLATE 20 MG IM SOLR: 20.0000 mg | INTRAMUSCULAR | Status: DC | PRN

## 2022-01-25 NOTE — ED Notes (Signed)
Pt given lunch
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
## 2022-01-25 NOTE — ED Notes (Signed)
Pt now awake, alert and eating breakfast including a biscuit, cereal and juice.

## 2022-01-25 NOTE — Progress Notes (Signed)
Patient reports she was on Keflex and Diflucan at [facility], she also reports she was on Percocet as well with the Creon for pancreatitis. Oncoming shift will be notified.

## 2022-01-25 NOTE — ED Notes (Signed)
Pt received breakfast tray and drink at this time.

## 2022-01-25 NOTE — ED Notes (Signed)
Pt did not eat or drink anything for breakfast. Resting in stretcher. Responsive to verbal stimuli, but resting when not being interacted with.

## 2022-01-25 NOTE — BH Assessment (Signed)
Patient has been accepted to [facility] on today 01/25/22 pending negative Covid results.  Patient assigned to room 504, bed# 1. Accepting physician is Dr. Louretta Shorten.  Call report to [phone].  Representative was [employer].   ER Staff is aware of it:  Lattie Haw, ER Secretary  Dr. Jacqualine Code, ER MD  Radonna Ricker, Patient's Nurse     Patient's Family/Support System [place]- mom [phone]) has been updated as well.

## 2022-01-25 NOTE — ED Notes (Signed)
[facility]  Dept  called  for  transfer  to  [place]

## 2022-01-25 NOTE — Tx Team (Signed)
Initial Treatment Plan 01/25/2022 5:32 PM Amy Wolfe YIA:[id number]    PATIENT STRESSORS: Health problems   Substance abuse     PATIENT STRENGTHS: Average or above average intelligence  General fund of knowledge    PATIENT IDENTIFIED PROBLEMS: Suicidal Ideation  Homicidal ideation                   DISCHARGE CRITERIA:  Ability to meet basic life and health needs  PRELIMINARY DISCHARGE PLAN: Placement in alternative living arrangements  PATIENT/FAMILY INVOLVEMENT: This treatment plan has been presented to and reviewed with the patient, Amy Wolfe, and/or family member, .  The patient and family have been given the opportunity to ask questions and make suggestions.  Johann Capers, RN 01/25/2022, 5:32 PM

## 2022-01-25 NOTE — Plan of Care (Signed)
  Problem: Education: Goal: Ability to describe self-care measures that may prevent or decrease complications (Diabetes Survival Skills Education) will improve Outcome: Progressing Goal: Individualized Educational Video(s) Outcome: Progressing   Problem: Coping: Goal: Ability to adjust to condition or change in health will improve Outcome: Progressing   Problem: Fluid Volume: Goal: Ability to maintain a balanced intake and output will improve Outcome: Progressing   Problem: Health Behavior/Discharge Planning: Goal: Ability to identify and utilize available resources and services will improve Outcome: Progressing Goal: Ability to manage health-related needs will improve Outcome: Progressing   Problem: Metabolic: Goal: Ability to maintain appropriate glucose levels will improve Outcome: Progressing   Problem: Nutritional: Goal: Maintenance of adequate nutrition will improve Outcome: Progressing Goal: Progress toward achieving an optimal weight will improve Outcome: Progressing   Problem: Skin Integrity: Goal: Risk for impaired skin integrity will decrease Outcome: Progressing   Problem: Tissue Perfusion: Goal: Adequacy of tissue perfusion will improve Outcome: Progressing   Problem: Education: Goal: Knowledge of [facility] Education information/materials will improve Outcome: Progressing Goal: Emotional status will improve Outcome: Progressing Goal: Mental status will improve Outcome: Progressing Goal: Verbalization of understanding the information provided will improve Outcome: Progressing   Problem: Activity: Goal: Interest or engagement in activities will improve Outcome: Progressing Goal: Sleeping patterns will improve Outcome: Progressing   Problem: Coping: Goal: Ability to verbalize frustrations and anger appropriately will improve Outcome: Progressing Goal: Ability to demonstrate self-control will improve Outcome: Progressing   Problem: Health  Behavior/Discharge Planning: Goal: Identification of resources available to assist in meeting health care needs will improve Outcome: Progressing Goal: Compliance with treatment plan for underlying cause of condition will improve Outcome: Progressing   Problem: Physical Regulation: Goal: Ability to maintain clinical measurements within normal limits will improve Outcome: Progressing   Problem: Safety: Goal: Periods of time without injury will increase Outcome: Progressing

## 2022-01-25 NOTE — ED Notes (Signed)
Pt requested shower; provided clean hospital clothing and linens.  Shower setup provided with soap, shampoo, toothbrush/toothpaste, and deodorant.  Pt able to preform own ADL's with no assistance.    

## 2022-01-25 NOTE — ED Notes (Signed)
[facility]/ Still pending Inpt. Admit

## 2022-01-25 NOTE — ED Notes (Signed)
Mother called requesting to speak with patient. RN offered phone to patient. Pt states that she will call her after lunch.

## 2022-01-26 DIAGNOSIS — F332 Major depressive disorder, recurrent severe without psychotic features: Secondary | ICD-10-CM | POA: Diagnosis not present

## 2022-01-26 DIAGNOSIS — G4700 Insomnia, unspecified: Secondary | ICD-10-CM | POA: Diagnosis present

## 2022-01-26 DIAGNOSIS — F1220 Cannabis dependence, uncomplicated: Secondary | ICD-10-CM | POA: Diagnosis present

## 2022-01-26 LAB — GLUCOSE, CAPILLARY
Glucose-Capillary: 110 mg/dL — ABNORMAL HIGH (ref 70–99)
Glucose-Capillary: 159 mg/dL — ABNORMAL HIGH (ref 70–99)
Glucose-Capillary: 193 mg/dL — ABNORMAL HIGH (ref 70–99)
Glucose-Capillary: 134 mg/dL — ABNORMAL HIGH (ref 70–99)
Glucose-Capillary: 213 mg/dL — ABNORMAL HIGH (ref 70–99)

## 2022-01-26 MED ORDER — RIVAROXABAN 20 MG PO TABS
20.0000 mg | ORAL_TABLET | Freq: Every day | ORAL | Status: DC
  Administered 2022-01-26 – 2022-01-30 (×4): 20 mg via ORAL
  Filled 2022-01-26 (×7): qty 1

## 2022-01-26 MED ORDER — NICOTINE POLACRILEX 2 MG MT GUM
2.0000 mg | CHEWING_GUM | OROMUCOSAL | Status: DC | PRN
  Administered 2022-01-26 – 2022-01-27 (×2): 2 mg via ORAL
  Filled 2022-01-26 (×2): qty 1

## 2022-01-26 MED ORDER — MIRTAZAPINE 30 MG PO TABS
30.0000 mg | ORAL_TABLET | Freq: Every day | ORAL | Status: DC
  Administered 2022-01-26: 30 mg via ORAL
  Filled 2022-01-26 (×7): qty 1

## 2022-01-26 MED ORDER — ARIPIPRAZOLE 5 MG PO TABS
5.0000 mg | ORAL_TABLET | Freq: Every day | ORAL | Status: DC
  Administered 2022-01-27 – 2022-01-28 (×2): 5 mg via ORAL
  Filled 2022-01-26 (×7): qty 1

## 2022-01-26 NOTE — Group Note (Signed)
[facility] LCSW Group Therapy Note   Group Date: 01/26/2022 Start Time: 1100 End Time: 1200   Type of Therapy and Topic: Group Therapy: Avoiding Self-Sabotaging and Enabling Behaviors  Participation Level: Active  Mood: Pleasant  Description of Group:  In this group, patients will learn how to identify obstacles, self-sabotaging and enabling behaviors, as well as: what are they, why do we do them and what needs these behaviors meet. Discuss unhealthy relationships and how to have positive healthy boundaries with those that sabotage and enable. Explore aspects of self-sabotage and enabling in yourself and how to limit these self-destructive behaviors in everyday life.   Therapeutic Goals: 1. Patient will identify one obstacle that relates to self-sabotage and enabling behaviors 2. Patient will identify one personal self-sabotaging or enabling behavior they did prior to admission 3. Patient will state a plan to change the above identified behavior 4. Patient will demonstrate ability to communicate their needs through discussion and/or role play.    Summary of Patient Progress:  Pt provided appropriate insight   Therapeutic Modalities:  Cognitive Behavioral Therapy Person-Centered Therapy Motivational Interviewing    Amy Garrette S Calla Wedekind, LCSW

## 2022-01-26 NOTE — Plan of Care (Signed)
  Problem: Safety: Goal: Periods of time without injury will increase Outcome: Progressing

## 2022-01-26 NOTE — [facility] Group Notes (Signed)
Pt attended wrap up group 

## 2022-01-26 NOTE — H&P (Addendum)
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  Patient asked for Percocet, states she takes this medication at home reviewed PDMP, and medication was sporadically prescribed in the past, but not exceeding 5 days at a time.  Education provided this will not be prescribed during this hospitalization.  Patient educated on all of her medications as listed above, rationales, benefits, possible side effects discussed with pt.  Other PRNS -Continue Tylenol 650 mg every 6 hours PRN for mild pain -Continue Maalox 30 mg every 4 hrs PRN for indigestion -Continue Imodium 2-4 mg as needed for diarrhea -Continue Milk of Magnesia as needed every 6 hrs for constipation -Continue Zofran disintegrating tabs every 6 hrs PRN for nausea  Discharge Planning: Social work and  case management to assist with discharge planning and identification of hospital follow-up needs prior to discharge Estimated LOS: 5-7 days Discharge Concerns: Need to establish a safety plan; Medication compliance and effectiveness Discharge Goals: Return home with outpatient referrals for mental health follow-up including medication management/psychotherapy  I certify that inpatient services furnished can reasonably be expected to improve the patient's condition.    Nicholes Rough, NP 1/25/20245:32 PM

## 2022-01-26 NOTE — Progress Notes (Signed)
D: Patient is anxious, depressed, alert, and cooperative. Denies SI, HI, AVH, and verbally contracts for safety.    A: Scheduled medications administered per MD order. Support provided. Patient educated on safety on the unit and medications. Routine safety checks every 15 minutes. Patient stated understanding to tell nurse about any new physical symptoms. Patient understands to tell staff of any needs.     R: No adverse drug reactions noted. Patient verbally contracts for safety. Patient remains safe at this time and will continue to monitor.    01/26/22 1000  Psych Admission Type (Psych Patients Only)  Admission Status Involuntary  Psychosocial Assessment  Patient Complaints Anxiety;Depression  Eye Contact Brief  Facial Expression Anxious  Affect Anxious;Depressed  Speech Logical/coherent  Interaction Assertive  Motor Activity Other (Comment) (WDL)  Appearance/Hygiene Unremarkable  Behavior Characteristics Anxious  Mood Anxious;Depressed  Thought Process  Coherency WDL  Content Blaming others  Delusions None reported or observed  Perception WDL  Hallucination None reported or observed  Judgment Impaired  Confusion None  Danger to Self  Current suicidal ideation? Denies  Agreement Not to Harm Self Yes  Description of Agreement verbal  Danger to Others  Danger to Others None reported or observed  Danger to Others Abnormal  Harmful Behavior to others No threats or harm toward other people
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
## 2022-01-26 NOTE — Progress Notes (Signed)
   01/26/22 0000  Psych Admission Type (Psych Patients Only)  Admission Status Involuntary  Psychosocial Assessment  Patient Complaints Anxiety;Depression  Eye Contact Brief  Facial Expression Anxious  Affect Anxious;Depressed  Speech Logical/coherent  Interaction Assertive  Motor Activity Fidgety  Appearance/Hygiene In scrubs  Behavior Characteristics Anxious  Mood Anxious;Depressed  Thought Process  Coherency WDL  Content Blaming others  Delusions None reported or observed  Perception WDL  Hallucination None reported or observed  Judgment Impaired  Confusion None  Danger to Self  Current suicidal ideation? Denies  Agreement Not to Harm Self Yes  Description of Agreement verbal  Danger to Others  Danger to Others None reported or observed  Danger to Others Abnormal  Harmful Behavior to others No threats or harm toward other people   Alert/oriented. Makes needs/concerns known to staff.  Cooperative with staff. Denies SI/HI/A/V hallucinations. Med compliant. PRN med given with good effect. Patient states went to group. Will encourage continue compliance and progression towards goals. Verbally contracted for safety. Will continue to monitor.

## 2022-01-27 ENCOUNTER — Encounter (HOSPITAL_COMMUNITY): Payer: Self-pay

## 2022-01-27 DIAGNOSIS — F332 Major depressive disorder, recurrent severe without psychotic features: Secondary | ICD-10-CM | POA: Diagnosis not present

## 2022-01-27 LAB — GLUCOSE, CAPILLARY
Glucose-Capillary: 144 mg/dL — ABNORMAL HIGH (ref 70–99)
Glucose-Capillary: 169 mg/dL — ABNORMAL HIGH (ref 70–99)
Glucose-Capillary: 169 mg/dL — ABNORMAL HIGH (ref 70–99)
Glucose-Capillary: 145 mg/dL — ABNORMAL HIGH (ref 70–99)

## 2022-01-27 NOTE — Progress Notes (Signed)
Pt refused accu check and medication at this time, stating later.

## 2022-01-27 NOTE — Progress Notes (Signed)
D) Pt received calm, visible, participating in milieu, and in no acute distress. Pt A & O x4. Pt denies SI, HI, A/ V H, depression, anxiety and pain at this time. A) Pt encouraged to drink fluids. Pt encouraged to come to staff with needs. Pt encouraged to attend and participate in groups. Pt encouraged to set reachable goals.  R) Pt remained safe on unit, in no acute distress, will continue to assess.     01/26/22 2000  Psych Admission Type (Psych Patients Only)  Admission Status Involuntary  Psychosocial Assessment  Patient Complaints Anxiety;Depression  Eye Contact Brief  Facial Expression Anxious  Affect Anxious;Depressed  Speech Logical/coherent  Interaction Assertive  Motor Activity Other (Comment) (wnl)  Appearance/Hygiene Unremarkable  Behavior Characteristics Anxious  Mood Anxious;Depressed  Thought Process  Coherency WDL  Content Blaming others  Delusions None reported or observed  Perception WDL  Hallucination None reported or observed  Judgment Impaired  Confusion None  Danger to Self  Current suicidal ideation? Denies  Agreement Not to Harm Self Yes  Description of Agreement verbal  Danger to Others  Danger to Others None reported or observed  Danger to Others Abnormal  Harmful Behavior to others No threats or harm toward other people

## 2022-01-27 NOTE — BH IP Treatment Plan (Signed)
Interdisciplinary Treatment and Diagnostic Plan Update  01/27/2022 Time of Session: 9:35 AM  Amy Wolfe MRN: [id number]  Principal Diagnosis: MDD (major depressive disorder), recurrent severe, without psychosis ([place])  Secondary Diagnoses: Principal Problem:   MDD (major depressive disorder), recurrent severe, without psychosis ([place]) Active Problems:   Pancreatitis, chronic ([place])   Diabetes mellitus due to underlying condition with hyperglycemia, with long-term current use of insulin ([place])   Cocaine use disorder ([place])   Delta-9-tetrahydrocannabinol (THC) dependence ([place])   Insomnia   Current Medications:  Current Facility-Administered Medications  Medication Dose Route Frequency Provider Last Rate Last Admin   acetaminophen (TYLENOL) tablet 650 mg  650 mg Oral Q6H PRN Nicholes Rough, NP   650 mg at 01/27/22 0846   alum & mag hydroxide-simeth (MAALOX/MYLANTA) 200-200-20 MG/5ML suspension 30 mL  30 mL Oral Q4H PRN Nicholes Rough, NP       ARIPiprazole (ABILIFY) tablet 5 mg  5 mg Oral Daily Nicholes Rough, NP   5 mg at 01/27/22 0846   hydrOXYzine (ATARAX) tablet 25 mg  25 mg Oral TID PRN Nicholes Rough, NP   25 mg at 01/25/22 2150   insulin aspart (novoLOG) injection 0-5 Units  0-5 Units Subcutaneous QHS Nicholes Rough, NP   2 Units at 01/26/22 2110   insulin aspart (novoLOG) injection 0-9 Units  0-9 Units Subcutaneous TID WC Nicholes Rough, NP   2 Units at 01/27/22 1256   insulin aspart (novoLOG) injection 3 Units  3 Units Subcutaneous TID WC Nicholes Rough, NP   3 Units at 01/27/22 1256   insulin glargine-yfgn (SEMGLEE) injection 6 Units  6 Units Subcutaneous Daily Nicholes Rough, NP   6 Units at 01/27/22 0900   lipase/protease/amylase (CREON) capsule 36,000 Units  36,000 Units Oral TID AC Nicholes Rough, NP   36,000 Units at 01/27/22 1204   OLANZapine zydis (ZYPREXA) disintegrating tablet 5 mg  5 mg Oral Q8H PRN Nicholes Rough, NP       And   LORazepam (ATIVAN) tablet 1 mg  1  mg Oral PRN Nicholes Rough, NP       And   ziprasidone (GEODON) injection 20 mg  20 mg Intramuscular PRN Nkwenti, Doris, NP       magnesium hydroxide (MILK OF MAGNESIA) suspension 30 mL  30 mL Oral Daily PRN Nicholes Rough, NP       mirtazapine (REMERON) tablet 30 mg  30 mg Oral QHS Nkwenti, Doris, NP   30 mg at 01/26/22 2110   nicotine polacrilex (NICORETTE) gum 2 mg  2 mg Oral PRN Janine Limbo, MD   2 mg at 01/26/22 1847   rivaroxaban (XARELTO) tablet 20 mg  20 mg Oral Q supper Massengill, Ovid Curd, MD   20 mg at 01/26/22 1657   traZODone (DESYREL) tablet 50 mg  50 mg Oral QHS PRN Nicholes Rough, NP   50 mg at 01/25/22 2153   PTA Medications: Medications Prior to Admission  Medication Sig Dispense Refill Last Dose   Accu-Chek Softclix Lancets lancets USE TO CHECK BLOOD SUGAR UP TO 4 TIMES DAILY AS DIRECTED 100 each 2    amLODipine (NORVASC) 5 MG tablet Take 1 tablet (5 mg total) by mouth daily. 30 tablet 0    amLODipine (NORVASC) 5 MG tablet Take one tablet by mouth daily (Patient taking differently: Take 5 mg by mouth daily.) 30 tablet 1    blood glucose meter kit and supplies KIT Dispense based on patient and insurance preference. Use up to four times daily  as directed. 1 each 0    blood glucose meter kit and supplies Dispense based on patient and insurance preference. Use up to four times daily as directed. (FOR ICD-10 E10.9, E11.9). 1 each 0    cephALEXin (KEFLEX) 500 MG capsule Take 1 capsule (500 mg total) by mouth 2 (two) times daily. 14 capsule 0    docusate sodium (COLACE) 100 MG capsule Take 1 capsule (100 mg total) by mouth 2 (two) times daily. 60 capsule 0    glucose blood (ACCU-CHEK GUIDE) test strip USE TO CHECK BLOOD SUGAR UP TO 4 TIMES DAILY AS DIRECTED 100 each 2    insulin aspart (NOVOLOG FLEXPEN) 100 UNIT/ML FlexPen Inject 3 Units into the skin 3 (three) times daily with meals. 15 mL 0    insulin aspart (NOVOLOG) 100 UNIT/ML FlexPen Inject 3 units into the skin 3 times a  day with meals 15 mL 1    insulin glargine-yfgn (SEMGLEE) 100 UNIT/ML injection Inject 0.06 mLs (6 Units total) into the skin daily. 10 mL 0    insulin glargine-yfgn (SEMGLEE) 100 UNIT/ML Pen Inject 0.6 ml into the skin daily 15 mL 1    Insulin Pen Needle (PEN NEEDLES) 30G X 8 MM MISC 1 each by Does not apply route daily. 90 each 3    Insulin Pen Needle 31G X 5 MM MISC use with levemir flexpen daily 100 each 3    Insulin Pen Needle 32G X 4 MM MISC use with novolog flexpen 100 each 0    lipase/protease/amylase (CREON) 36000 UNITS CPEP capsule Take 2 capsules by mouth 3 times daily with meals 180 capsule 1    mirtazapine (REMERON) 30 MG tablet take one tablet by mouth at bedtime (Patient not taking: Reported on 01/24/2022) 30 tablet 1    Multiple Vitamin (MULTIVITAMIN WITH MINERALS) TABS tablet Take 1 tablet by mouth daily. 30 tablet 0    oxyCODONE-acetaminophen (PERCOCET/ROXICET) 5-325 MG tablet Take 1-2 tablets by mouth 3 (three) times daily before meals. 120 tablet 0    pantoprazole (PROTONIX) 40 MG tablet Take one tablet by mouth daily 30 tablet 1    rivaroxaban (XARELTO) 20 MG TABS tablet Take 1 tablet (20 mg total) by mouth daily with supper. 30 tablet 0    rivaroxaban (XARELTO) 20 MG TABS tablet Take one tablet by mouth daily with supper 30 tablet 1     Patient Stressors: Health problems   Substance abuse    Patient Strengths: Average or above average intelligence  General fund of knowledge   Treatment Modalities: Medication Management, Group therapy, Case management,  1 to 1 session with clinician, Psychoeducation, Recreational therapy.   Physician Treatment Plan for Primary Diagnosis: MDD (major depressive disorder), recurrent severe, without psychosis ([place]) Long Term Goal(s): Improvement in symptoms so as ready for discharge   Short Term Goals: Ability to identify changes in lifestyle to reduce recurrence of condition will improve Ability to verbalize feelings will  improve Ability to disclose and discuss suicidal ideas Ability to identify and develop effective coping behaviors will improve Ability to maintain clinical measurements within normal limits will improve Ability to identify triggers associated with substance abuse/mental health issues will improve  Medication Management: Evaluate patient's response, side effects, and tolerance of medication regimen.  Therapeutic Interventions: 1 to 1 sessions, Unit Group sessions and Medication administration.  Evaluation of Outcomes: Progressing  Physician Treatment Plan for Secondary Diagnosis: Principal Problem:   MDD (major depressive disorder), recurrent severe, without psychosis ([place]) Active Problems:  Pancreatitis, chronic ([facility])   Diabetes mellitus due to underlying condition with hyperglycemia, with long-term current use of insulin ([facility])   Cocaine use disorder ([facility])   Delta-9-tetrahydrocannabinol (THC) dependence ([place])   Insomnia  Long Term Goal(s): Improvement in symptoms so as ready for discharge   Short Term Goals: Ability to identify changes in lifestyle to reduce recurrence of condition will improve Ability to verbalize feelings will improve Ability to disclose and discuss suicidal ideas Ability to identify and develop effective coping behaviors will improve Ability to maintain clinical measurements within normal limits will improve Ability to identify triggers associated with substance abuse/mental health issues will improve     Medication Management: Evaluate patient's response, side effects, and tolerance of medication regimen.  Therapeutic Interventions: 1 to 1 sessions, Unit Group sessions and Medication administration.  Evaluation of Outcomes: Progressing   RN Treatment Plan for Primary Diagnosis: MDD (major depressive disorder), recurrent severe, without psychosis ([place]) Long Term Goal(s): Knowledge of disease and therapeutic regimen to maintain health will improve  Short  Term Goals: Ability to remain free from injury will improve, Ability to verbalize frustration and anger appropriately will improve, Ability to demonstrate self-control, Ability to participate in decision making will improve, Ability to verbalize feelings will improve, Ability to disclose and discuss suicidal ideas, Ability to identify and develop effective coping behaviors will improve, and Compliance with prescribed medications will improve  Medication Management: RN will administer medications as ordered by provider, will assess and evaluate patient's response and provide education to patient for prescribed medication. RN will report any adverse and/or side effects to prescribing provider.  Therapeutic Interventions: 1 on 1 counseling sessions, Psychoeducation, Medication administration, Evaluate responses to treatment, Monitor vital signs and CBGs as ordered, Perform/monitor CIWA, COWS, AIMS and Fall Risk screenings as ordered, Perform wound care treatments as ordered.  Evaluation of Outcomes: Progressing   LCSW Treatment Plan for Primary Diagnosis: MDD (major depressive disorder), recurrent severe, without psychosis ([place]) Long Term Goal(s): Safe transition to appropriate next level of care at discharge, Engage patient in therapeutic group addressing interpersonal concerns.  Short Term Goals: Engage patient in aftercare planning with referrals and resources, Increase social support, Increase ability to appropriately verbalize feelings, Increase emotional regulation, Facilitate acceptance of mental health diagnosis and concerns, Facilitate patient progression through stages of change regarding substance use diagnoses and concerns, Identify triggers associated with mental health/substance abuse issues, and Increase skills for wellness and recovery  Therapeutic Interventions: Assess for all discharge needs, 1 to 1 time with Social worker, Explore available resources and support systems, Assess for  adequacy in community support network, Educate family and significant other(s) on suicide prevention, Complete Psychosocial Assessment, Interpersonal group therapy.  Evaluation of Outcomes: Progressing   Progress in Treatment: Attending groups: Yes. Participating in groups: Yes. Taking medication as prescribed: Yes. Toleration medication: Yes. Family/Significant other contact made: No, will contact:  Rod Holler  [phone] (Friend) Patient understands diagnosis: Yes. Discussing patient identified problems/goals with staff: Yes. Medical problems stabilized or resolved: Yes. Denies suicidal/homicidal ideation: Yes. Issues/concerns per patient self-inventory: No.   New problem(s) identified: No, Describe:  None Reported   New Short Term/Long Term Goal(s):medication stabilization, elimination of SI thoughts, development of comprehensive mental wellness plan.    Patient Goals:  "Get out of here, and medication"   Discharge Plan or Barriers: Patient recently admitted. CSW will continue to follow and assess for appropriate referrals and possible discharge planning.    Reason for Continuation of Hospitalization: Aggression Depression Homicidal ideation Mania  Medication stabilization Suicidal ideation  Estimated Length of Stay: 3-5 days   Last 3 [place] Suicide Severity Risk Score: Flowsheet Row Admission (Current) from 01/25/2022 in [facility] [facility] ED from 01/22/2022 in [facility] Emergency Department at [facility] ED from 01/14/2022 in [facility] Emergency Department at [place] High Risk High Risk High Risk       Last PHQ 2/9 Scores:    09/02/2021   10:42 AM 03/28/2021   11:29 AM 03/03/2021    3:10 PM  Depression screen PHQ 2/9  Decreased Interest 0 0 0  Down, Depressed, Hopeless 0 0 0  PHQ - 2 Score 0 0 0  Altered sleeping 0 0 0  Tired, decreased energy 0 0 0  Change in appetite 0 0 0  Feeling bad or  failure about yourself  0 0 0  Trouble concentrating 0 0 0  Moving slowly or fidgety/restless 0 0 0  Suicidal thoughts 0 0 0  PHQ-9 Score 0 0 0  Difficult doing work/chores Not difficult at all  Not difficult at all    Scribe for Treatment Team: Sherre Lain, LCSWA 01/27/2022 1:12 PM

## 2022-01-27 NOTE — Progress Notes (Signed)
D: Patient is alert, oriented, and cooperative. Affect and mood are anxious and depressed. Denies SI, HI, AVH, and verbally contracts for safety.    A: Scheduled medications administered per MD order. Support provided. Patient educated on safety on the unit and medications. Routine safety checks every 15 minutes. Patient stated understanding to tell nurse about any new physical symptoms. Patient understands to tell staff of any needs.     R: No adverse drug reactions noted. Patient verbally contracts for safety. Patient remains safe at this time and will continue to monitor.    01/27/22 1100  Psych Admission Type (Psych Patients Only)  Admission Status Involuntary  Psychosocial Assessment  Patient Complaints Anxiety;Depression  Eye Contact Fair  Facial Expression Anxious  Affect Anxious;Depressed  Speech Logical/coherent  Interaction Assertive  Motor Activity Other (Comment) (WDL)  Appearance/Hygiene Unremarkable  Behavior Characteristics Anxious  Mood Anxious;Depressed  Thought Process  Coherency WDL  Content Blaming others  Delusions None reported or observed  Perception WDL  Hallucination None reported or observed  Judgment Impaired  Confusion None  Danger to Self  Current suicidal ideation? Denies  Agreement Not to Harm Self Yes  Description of Agreement verbal  Danger to Others  Danger to Others None reported or observed  Danger to Others Abnormal  Harmful Behavior to others No threats or harm toward other people  Destructive Behavior No threats or harm toward property

## 2022-01-27 NOTE — Group Note (Signed)
Recreation Therapy Group Note   Group Topic:Team Building  Group Date: 01/27/2022 Start Time: 0935 End Time: 1020 Facilitators: Japhet Morgenthaler-McCall, LRT,CTRS Location: [address] Dayroom   Goal Area(s) Addresses:  Patient will effectively work with peer towards shared goal.  Patient will identify skills used to make activity successful.  Patient will identify how skills used during activity can be used to reach post d/c goals.   Group Description: Straw Bridge. In teams of 3-5, patients were given 15 plastic drinking straws and an equal length of masking tape. Using the materials provided, patients were instructed to build a free standing bridge-like structure to suspend an everyday item (ex: puzzle box) off of the floor or table surface. All materials were required to be used by the team in their design. LRT facilitated post-activity discussion reviewing team process. Patients were encouraged to reflect how the skills used in this activity can be generalized to daily life post discharge.   Affect/Mood: N/A   Participation Level: Did not attend    Clinical Observations/Individualized Feedback:     Plan: Continue to engage patient in RT group sessions 2-3x/week.   Quadry Kampa-McCall, LRT,CTRS 01/27/2022 1:24 PM
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
## 2022-01-27 NOTE — Group Note (Signed)
Date:  01/27/2022 Time:  5:01 PM  Group Topic/Focus:  Wellness Toolbox:   The focus of this group is to discuss various aspects of wellness, balancing those aspects and exploring ways to increase the ability to experience wellness.  Patients will create a wellness toolbox for use upon discharge.    Participation Level:  Active  Participation Quality:  Appropriate  Affect:  Appropriate  Cognitive:  Appropriate  Insight: Appropriate  Engagement in Group:  Engaged  Modes of Intervention:  Exploration   Additional Comments:   Patient uses the Serenity Prayer as a way of accepting  situations that are overwhelming in her life.   Jerrye Beavers 01/27/2022, 5:01 PM
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
## 2022-01-28 DIAGNOSIS — F332 Major depressive disorder, recurrent severe without psychotic features: Secondary | ICD-10-CM | POA: Diagnosis not present

## 2022-01-28 LAB — URINALYSIS, COMPLETE (UACMP) WITH MICROSCOPIC
Bilirubin Urine: NEGATIVE
Glucose, UA: NEGATIVE mg/dL
Ketones, ur: NEGATIVE mg/dL
Leukocytes,Ua: NEGATIVE
Nitrite: NEGATIVE
Protein, ur: NEGATIVE mg/dL
Specific Gravity, Urine: 1.012 (ref 1.005–1.030)
pH: 7 (ref 5.0–8.0)

## 2022-01-28 LAB — GLUCOSE, CAPILLARY
Glucose-Capillary: 143 mg/dL — ABNORMAL HIGH (ref 70–99)
Glucose-Capillary: 156 mg/dL — ABNORMAL HIGH (ref 70–99)

## 2022-01-28 MED ORDER — CEPHALEXIN 500 MG PO CAPS
500.0000 mg | ORAL_CAPSULE | Freq: Two times a day (BID) | ORAL | Status: DC
  Administered 2022-01-29 – 2022-01-31 (×5): 500 mg via ORAL
  Filled 2022-01-28 (×9): qty 1
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
## 2022-01-28 NOTE — Progress Notes (Signed)
Refused evening vitals

## 2022-01-28 NOTE — Progress Notes (Signed)
Pt anxious, but pleasant, rated her day a 6/10 and states that she wants to work on being more positive throughout her day. Pt refused any sleep medications, states that it makes her feel "groggy and irritable" during the day. At bedtime pt states that she used the restroom and there was a small amount of faint blood on the toilet paper. Pt reports that she was diagnosed with a UTI in ED and was started on Keflex, but was not continued when she arrived here at [facility]. Informed NP, and pt states that she will speak with physician in the morning also. Urine cup in room. Denies SI/HI or hallucinations (a) 15 min checks (r) safety maintained.

## 2022-01-28 NOTE — Progress Notes (Signed)
Psychoeducational Group Note  Date:  01/28/2022 Time:  wrap up group Group Topic/Focus:  Wrap up group  Participation Level: Did Not Attend  Participation Quality:  Not Applicable  Affect:  Not Applicable  Cognitive:  Not Applicable  Insight:  Not Applicable  Engagement in Group: Not Applicable  Additional Comments:  Did not attend.   Shellia Cleverly 01/28/2022, 9:15 PM

## 2022-01-28 NOTE — Plan of Care (Signed)
  Problem: Education: Goal: Knowledge of [facility] [facility] Education information/materials will improve Outcome: Progressing Goal: Emotional status will improve Outcome: Progressing Goal: Mental status will improve Outcome: Progressing

## 2022-01-28 NOTE — [facility] Group Notes (Signed)
.  Psychoeducational Group Note    Date:  01/28/2022 Time:1300-1400    Purpose of Group: . The group focus' on teaching patients on how to identify their needs and their Life Skills:  A group where two lists are made. What people need and what are things that we do that are unhealthy. The lists are developed by the patients and it is explained that we often do the actions that are not healthy to get our list of needs met.  Goal:: to develop the coping skills needed to get their needs met  Participation Level:  did not attend Additional Comments:    [name]

## 2022-01-28 NOTE — Progress Notes (Signed)
Pt refused CBG monitoring and snack

## 2022-01-28 NOTE — [facility] Group Notes (Signed)
Group Focus: affirmation, clarity of thought, and goals/reality orientation Treatment Modality:  Psychoeducation Interventions utilized were assignment, group exercise, and support Purpose: To be able to understand and verbalize the reason for their admission to the hospital. To understand that the medication helps with their chemical imbalance but they also need to work on their choices in life. To be challenged to develop a list of 30 positives about themselves. Also introduce the concept that "feelings" are not reality.  Participation Level:  did not attend Additional Comments:  ...  [name]

## 2022-01-28 NOTE — Progress Notes (Signed)
  Patient isolative to her room this shift. Patient laying in bed at time of assessment, patient refused to talk to this RN, patient ignored every conversation with this RN, patient stated "I'm sleeping, I will talk to you tomorrow."  Patient was asked to notify RN when she is ready to talk and  for her medications, patient stated "No, thank you." Patient refused accucheck  x 3 attempts. Q 15 minutes safety checks maintained.

## 2022-01-28 NOTE — Progress Notes (Signed)
D: Patient alert and oriented, able to make needs known. Continues to be labile and irritable at times. Denies SI/HI, AVH at present. Denies pain at present. Patient goal today "peace, nicotine" Rates depression 1/10, hopelessness 0/10, and anxiety 0/10. Patient reports energy level as normal. She reports she slept poorly last night but she refused to take any medication for it. Patient does not request any PRN medication at this time.   A: Scheduled medications administered to patient per MD order. Support and encouragement provided. Routine safety checks conducted every fifteen minutes. Patient informed to notify staff with problems or concerns. Frequent verbal contact made.   R: No adverse drug reactions noted. Patient contracts for safety at this time. Patient is compliant with medications and treatment plan. Patient receptive, calm and cooperative. Patient interacts with others appropriately on unit at present. Patient remains safe at present.

## 2022-01-28 NOTE — Progress Notes (Signed)
Patient did not get up and go to dinner, she is crying because she states she is so sleepy from the medication she took earlier, (Abilify). She refuses accucheck at this time and medications. Will attempt again before shift is over. She was offered dinner but refused.

## 2022-01-28 NOTE — Group Note (Signed)
LCSW Group Therapy Note   No social work group was held; the following was provided to each patient  in lieu of in-person group:  Healthy vs. 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

## 2022-01-29 DIAGNOSIS — F332 Major depressive disorder, recurrent severe without psychotic features: Secondary | ICD-10-CM | POA: Diagnosis not present

## 2022-01-29 LAB — GLUCOSE, CAPILLARY
Glucose-Capillary: 159 mg/dL — ABNORMAL HIGH (ref 70–99)
Glucose-Capillary: 202 mg/dL — ABNORMAL HIGH (ref 70–99)
Glucose-Capillary: 251 mg/dL — ABNORMAL HIGH (ref 70–99)
Glucose-Capillary: 249 mg/dL — ABNORMAL HIGH (ref 70–99)

## 2022-01-29 MED ORDER — WHITE PETROLATUM EX OINT
TOPICAL_OINTMENT | CUTANEOUS | Status: AC
  Administered 2022-01-29: 1
  Filled 2022-01-29: qty 10
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
## 2022-01-29 NOTE — Plan of Care (Signed)
  Problem: Coping: Goal: Ability to demonstrate self-control will improve Outcome: Progressing   Problem: Safety: Goal: Periods of time without injury will increase Outcome: Progressing

## 2022-01-29 NOTE — Plan of Care (Signed)
  Problem: Education: Goal: Mental status will improve Outcome: Progressing   Problem: Activity: Goal: Interest or engagement in activities will improve Outcome: Progressing   Problem: Safety: Goal: Periods of time without injury will increase Outcome: Progressing

## 2022-01-29 NOTE — Group Note (Signed)
LCSW Group Therapy Note   Group Date: 01/29/2022 Start Time: 3846 End Time: 1145   Type of Therapy and Topic:  Group Therapy:  Communication  Participation Level:  Minimal   Description of Group:    In this group patients will be encouraged to explore how individuals communicate with one another appropriately and inappropriately. Patients will be guided to discuss their thoughts, feelings, and behaviors related to barriers communicating feelings, needs, and stressors. The group will process together ways to execute positive and appropriate communications, with attention given to how one use behavior, tone, and body language to communicate. Patient will be encouraged to reflect on an incident where they were successfully able to communicate and the factors that they believe helped them to communicate. Each patient will be encouraged to identify specific changes they are motivated to make in order to overcome communication barriers with self, peers, authority, and parents. This group will be process-oriented, with patients participating in exploration of their own experiences as well as giving and receiving support and challenging self as well as other group members.  Therapeutic Goals: Patient will identify how people communicate (body language, facial expression, and electronics) Also discuss tone, voice and how these impact what is communicated and how the message is perceived.  Patient will identify feelings (such as fear or worry), thought process and behaviors related to why people internalize feelings rather than express self openly. Patient will identify two changes they are willing to make to overcome communication barriers. Members will then practice through Role Play how to communicate by utilizing psycho-education material (such as I Feel statements and acknowledging feelings rather than displacing on others)   Summary of Patient Progress Patient came to group and accepted the  provided worksheet. Patient introduced herself and afterwards was reviewing her worksheet. Patient did not share any insight on topic or how she would respond to the practice questions. Patient left group halfway through.    Therapeutic Modalities:   Cognitive Behavioral Therapy Solution Focused Therapy Motivational Interviewing Family Systems Approach   Sherre Lain, Latanya Presser 01/29/2022  2:23 PM

## 2022-01-29 NOTE — Progress Notes (Signed)
   01/29/22 0900  Psych Admission Type (Psych Patients Only)  Admission Status Involuntary  Psychosocial Assessment  Patient Complaints Irritability  Eye Contact Brief  Facial Expression Anxious  Affect Anxious;Labile  Speech International aid/development worker;Restless  Appearance/Hygiene In hospital gown  Behavior Characteristics Cooperative;Irritable  Mood Depressed  Thought Process  Coherency WDL  Content Blaming others  Delusions None reported or observed  Perception WDL  Hallucination None reported or observed  Judgment Impaired  Confusion None  Danger to Self  Current suicidal ideation? Denies  Agreement Not to Harm Self Yes  Description of Agreement Verbal  Danger to Others  Danger to Others None reported or observed  Danger to Others Abnormal  Harmful Behavior to others No threats or harm toward other people

## 2022-01-29 NOTE — Progress Notes (Signed)
   01/29/22 2200  Psych Admission Type (Psych Patients Only)  Admission Status Involuntary  Psychosocial Assessment  Patient Complaints Anxiety  Eye Contact Fair  Facial Expression Anxious  Affect Anxious  Speech Logical/coherent  Interaction Assertive  Motor Activity Restless  Appearance/Hygiene Improved  Behavior Characteristics Cooperative  Mood Anxious  Thought Process  Coherency WDL  Content WDL  Delusions None reported or observed  Perception WDL  Hallucination None reported or observed  Judgment Impaired  Confusion None  Danger to Self  Current suicidal ideation? Denies  Agreement Not to Harm Self Yes  Description of Agreement verbal  Danger to Others  Danger to Others None reported or observed  Danger to Others Abnormal  Harmful Behavior to others No threats or harm toward other people   Alert/oriented. Makes needs/concerns known to staff. Pleasant cooperative with staff. Denies SI/HI/A/V hallucinations. Med complian. Patient states went to group. Will encourage continue compliance and progression towards goals. Verbally contracted for safety. Will continue to monitor.

## 2022-01-29 NOTE — [facility] Group Notes (Signed)
Adult Psychoeducational Group  Date:  01/29/2022 Time: 1300-1400  Group Topic/Focus: Continuation of the group from Saturday. Looking at the lists that were created and talking about what needs to be done with the homework of 30 positives about themselves.                                     Talking about taking their power back and helping themselves to develop a positive self esteem.      Participation Quality:  did not attend  [name]

## 2022-01-30 DIAGNOSIS — F332 Major depressive disorder, recurrent severe without psychotic features: Secondary | ICD-10-CM | POA: Diagnosis not present

## 2022-01-30 LAB — GLUCOSE, CAPILLARY
Glucose-Capillary: 186 mg/dL — ABNORMAL HIGH (ref 70–99)
Glucose-Capillary: 242 mg/dL — ABNORMAL HIGH (ref 70–99)
Glucose-Capillary: 191 mg/dL — ABNORMAL HIGH (ref 70–99)
Glucose-Capillary: 193 mg/dL — ABNORMAL HIGH (ref 70–99)

## 2022-01-30 NOTE — Group Note (Signed)
Occupational Therapy Group Note  Group Topic:Coping Skills  Group Date: 01/30/2022 Start Time: 1415 End Time: 1510 Facilitators: Brantley Stage, OT   Group Description: Group encouraged increased engagement and participation through discussion and activity focused on "Coping Ahead." Patients were split up into teams and selected a card from a stack of positive coping strategies. Patients were instructed to act out/charade the coping skill for other peers to guess and receive points for their team. Discussion followed with a focus on identifying additional positive coping strategies and patients shared how they were going to cope ahead over the weekend while continuing hospitalization stay.  Therapeutic Goal(s): Identify positive vs negative coping strategies. Identify coping skills to be used during hospitalization vs coping skills outside of hospital/at home Increase participation in therapeutic group environment and promote engagement in treatment   Participation Level: Active and Engaged   Participation Quality: Independent   Behavior: Appropriate and Attentive    Speech/Thought Process: Coherent and Relevant   Affect/Mood: Appropriate   Insight: Fair   Judgement: Fair   Individualization: pt was active in their participation of group discussion/activity. New skills were identified  Modes of Intervention: Discussion and Education  Patient Response to Interventions:  Attentive   Plan: Continue to engage patient in OT groups 2 - 3x/week.  01/30/2022  Brantley Stage, OT Cornell Barman, OT
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
## 2022-01-30 NOTE — Plan of Care (Signed)
  Problem: Coping: Goal: Ability to adjust to condition or change in health will improve Outcome: Progressing   Problem: Health Behavior/Discharge Planning: Goal: Ability to identify and utilize available resources and services will improve Outcome: Progressing Goal: Ability to manage health-related needs will improve Outcome: Progressing

## 2022-01-30 NOTE — Progress Notes (Signed)
   01/30/22 0800  Psych Admission Type (Psych Patients Only)  Admission Status Involuntary  Psychosocial Assessment  Patient Complaints Anxiety  Eye Contact Fair  Facial Expression Anxious  Affect Anxious  Speech Logical/coherent  Interaction Assertive  Motor Activity Restless  Appearance/Hygiene Improved  Behavior Characteristics Cooperative  Mood Anxious  Thought Process  Coherency WDL  Content WDL  Delusions None reported or observed  Perception WDL  Hallucination None reported or observed  Judgment Impaired  Confusion None  Danger to Self  Current suicidal ideation? Denies  Agreement Not to Harm Self Yes  Description of Agreement verbal  Danger to Others  Danger to Others None reported or observed  Danger to Others Abnormal  Harmful Behavior to others No threats or harm toward other people  Destructive Behavior No threats or harm toward property

## 2022-01-31 DIAGNOSIS — F332 Major depressive disorder, recurrent severe without psychotic features: Secondary | ICD-10-CM | POA: Diagnosis not present

## 2022-01-31 LAB — GLUCOSE, CAPILLARY: Glucose-Capillary: 162 mg/dL — ABNORMAL HIGH (ref 70–99)

## 2022-01-31 MED ORDER — CEPHALEXIN 500 MG PO CAPS: 500.0000 mg | ORAL_CAPSULE | Freq: Two times a day (BID) | ORAL | 0 refills | Status: DC

## 2022-01-31 NOTE — Progress Notes (Signed)
  [facility] Adult Case Management Discharge Plan :  Will you be returning to the same living situation after discharge:  Yes,  Home with Friend  At discharge, do you have transportation home?: Yes,  Taxi  Do you have the ability to pay for your medications: Yes,  Medicaid   Release of information consent forms completed and in the chart;  Patient's signature needed at discharge.  Patient to Follow up at:  Follow-up Information     [facility] on 02/03/2022.   Why: You have a hospital follow up appointment on 02/03/22 at 10:00 am.   This appointment will be held in person.  Following this appointment, you will be scheduled for a clinical assessment, to obtain necessary therapy and medication management services. Contact information: [place] [address] [phone]                 Next level of care provider has access to [place] and Suicide Prevention discussed: Yes,  with patient and friend      Has patient been referred to the Quitline?: N/A patient is not a smoker  Patient has been referred for addiction treatment: Pt. refused referral, patient may inquire about substance use services during intake appointment at [facility].  Darleen Crocker, LCSWA 01/31/2022, 9:00 AM

## 2022-01-31 NOTE — Progress Notes (Signed)

## 2022-01-31 NOTE — [facility] Group Notes (Signed)
PT did not attend goals group today
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
## 2022-01-31 NOTE — Progress Notes (Signed)
Pt discharged at 1109. Pt denied current SI/HI/AVH. Pt left with all belongings. Pt verbalized understanding of discharge instructions. Paper prescription provided to pick up medication from pharmacy and explained to pt process to pick up medication. Suicide safety plan given to pt to keep following discharge.
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
## 2022-02-16 NOTE — Congregational Nurse Program (Signed)
  Dept: [phone]   Congregational Nurse Program Note  Date of Encounter: 02/16/2022 11:15 am Client to [facility] day center with reports of needing a PCP apt. She has been seen at [facility] in the past. Client agreeable to RN contacting [facility] for apt. Apt made for 2/19 at 2 pm. Client reports she has been out of her medications and does not eat due to not having her insulin. She also does not have a glucometer. She does have Medicaid and should be able to get prescriptions at her apt on Monday. She is currently living in a tent with her large dog, recently evicted, unclear as to how long. Client was anxious to leave the center, but appreciate of appointment made. Appointment card given. Emotional support given. Past Medical History: Past Medical History:  Diagnosis Date   Abdominal pain    Anxiety    Coronary artery disease    Diabetes mellitus without complication (HCC)    Dyspnea    GERD (gastroesophageal reflux disease)    H/O blood clots    Hypertension    Liver abscess    Pancreatitis 2019   PONV (postoperative nausea and vomiting) 11/07/2018   Thyroid disease    Uterine fibroid     Encounter Details:  CNP Questionnaire - 02/16/22 2104       Questionnaire   Ask client: Do you give verbal consent for me to treat you today? Yes    Student Assistance N/A    Location Patient [place]    Visit Setting with Client Organization    Patient Status Unhoused   reports she was recently evicted   Insurance Medicaid    Insurance/Financial Assistance Referral N/A    Medication Have Medication Insecurities   client reports not having her medication "in months"   Medical Provider Yes   [facility]   Screening Referrals Made N/A    Medical Referrals Made N/A    Medical Appointment Made [facility] PCP/clinic   apt made at client request for 2/19 at 2 pm   Recently w/o PCP, now 1st time PCP visit completed due to CNs referral or appointment made [facility] Insecurities    Transportation N/A   unsure of trnasportaion needs,   Housing/Utilities No permanent housing    Interpersonal Safety N/A    Interventions Advocate/Support;Navigate Healthcare System;Case Management    Abnormal to Normal Screening Since Last CN Visit N/A    Screenings CN Performed N/A    Sent Client to Lab for: N/A    Did client attend any of the following based off CNs referral or appointments made? N/A    ED Visit Averted N/A    Life-Saving Intervention Made N/A

## 2022-02-19 NOTE — Progress Notes (Deleted)
Established Patient Office Visit  Subjective:  Patient ID: Amy Wolfe, female    DOB: June 03, 1975  Age: 47 y.o. MRN: [id number]  CC:  No chief complaint on file.   HPI [place] presents for follow up. Friend accompanying her to visit today.   Alcohol Induced Pancreatitis: -Stopped drinking about 1 year ago -Has flares of severe cramping/sharp epigastric pains that radiate to the back on a monthly basis -Pain has been worse with eating any type of food but now pain is much better, has been eating again -Following with GI at [facility]- plan discussed: stop marijuana, tobacco use, PPI increase to BID, autoimmune work up. -Recently underwent EUS/ERCP 08/22/21 for PD cannulation and stenting/stone removal  -Currently on Creon, Protonix   Diabetes, new onset: -Last A1c 7.7% 11/23 -Medications: Levemir 6 units daily, started on Novolog 3 units TID -Failed Meds: Tried Metformin 500 mg but could not tolerate -Checking BG at home: <230 but out of insulin -Diet: improved now that pain is better controlled  -Exercise: None -Eye exam: Due -Foot exam: UTD 9/23 -Microalbumin: UTD 9/23 -Statin: No -PNA vaccine: Due -Denies symptoms of hypoglycemia, polyuria, polydipsia, numbness extremities, foot ulcers/trauma.   Chronic Portal Vein Thrombosis: -Had been on Xarelto 20 mg daily but has been out recently, needs refills   MDD: -Following with Psychiatry, hospital discharge summary reviewed from 01/31/22 -Mood status: {Blank single:19197::"controlled","uncontrolled","better","worse","exacerbated","stable"} -Current treatment: Remeron 30 mg -Satisfied with current treatment?: {Blank single:19197::"yes","no"} -Symptom severity: {Blank single:19197::"mild","moderate","severe"}  -Duration of current treatment : {Blank single:19197::"chronic","months","years"} -Side effects: {Blank single:19197::"yes","no"} Medication compliance: {Blank single:19197::"excellent  compliance","good compliance","fair compliance","poor compliance"} Psychotherapy/counseling: {Blank single:19197::"yes","no"} {Blank single:19197::"current","in the past"} Previous psychiatric medications: {Blank multiple:19196::"abilify","amitryptiline","buspar","celexa","cymbalta","depakote","effexor","lamictal","lexapro","lithium","nortryptiline","paxil","prozac","pristiq (desvenlafaxine","seroquel","wellbutrin","zoloft","zyprexa"} Depressed mood: {Blank single:19197::"yes","no"} Anxious mood: {Blank single:19197::"yes","no"} Anhedonia: {Blank single:19197::"yes","no"} Significant weight loss or gain: {Blank single:19197::"yes","no"} Insomnia: {Blank single:19197::"yes","no"} {Blank single:19197::"hard to fall asleep","hard to stay asleep"} Fatigue: {Blank single:19197::"yes","no"} Feelings of worthlessness or guilt: {Blank single:19197::"yes","no"} Impaired concentration/indecisiveness: {Blank single:19197::"yes","no"} Suicidal ideations: {Blank single:19197::"yes","no"} Hopelessness: {Blank single:19197::"yes","no"} Crying spells: {Blank single:19197::"yes","no"}    09/02/2021   10:42 AM 03/28/2021   11:29 AM 03/03/2021    3:10 PM 02/24/2021    1:47 PM 10/04/2018   10:25 AM  Depression screen PHQ 2/9  Decreased Interest 0 0 0 0 0  Down, Depressed, Hopeless 0 0 0 0 0  PHQ - 2 Score 0 0 0 0 0  Altered sleeping 0 0 0 0 0  Tired, decreased energy 0 0 0 0 0  Change in appetite 0 0 0 0 0  Feeling bad or failure about yourself  0 0 0 0 0  Trouble concentrating 0 0 0 0 0  Moving slowly or fidgety/restless 0 0 0 0 0  Suicidal thoughts 0 0 0 0 0  PHQ-9 Score 0 0 0 0 0  Difficult doing work/chores Not difficult at all  Not difficult at all Not difficult at all Not difficult at all     Past Medical History:  Diagnosis Date   Abdominal pain    Anxiety    Coronary artery disease    Diabetes mellitus without complication (HCC)    Dyspnea    GERD (gastroesophageal reflux disease)    H/O  blood clots    Hypertension    Liver abscess    Pancreatitis 2019   PONV (postoperative nausea and vomiting) 11/07/2018   Thyroid disease    Uterine fibroid     Past Surgical History:  Procedure Laterality Date   BILIARY BRUSHING  08/22/2021   Procedure:  BILIARY BRUSHING;  Surgeon: Rush Landmark Telford Nab., MD;  Location: [facility];  Service: Gastroenterology;;   BIOPSY  06/01/2021   Procedure: BIOPSY;  Surgeon: Irving Copas., MD;  Location: [facility];  Service: Gastroenterology;;   CESAREAN SECTION     EMBOLIZATION N/A 06/14/2018   Procedure: Uterine Embolization;  Surgeon: Algernon Huxley, MD;  Location: [facility];  Service: Cardiovascular;  Laterality: N/A;   ENDOSCOPIC RETROGRADE CHOLANGIOPANCREATOGRAPHY (ERCP) WITH PROPOFOL N/A 08/22/2021   Procedure: ENDOSCOPIC RETROGRADE CHOLANGIOPANCREATOGRAPHY (ERCP) WITH PROPOFOL;  Surgeon: Rush Landmark Telford Nab., MD;  Location: [facility];  Service: Gastroenterology;  Laterality: N/A;   ESOPHAGOGASTRODUODENOSCOPY (EGD) WITH PROPOFOL N/A 06/01/2021   Procedure: ESOPHAGOGASTRODUODENOSCOPY (EGD) WITH PROPOFOL;  Surgeon: Rush Landmark Telford Nab., MD;  Location: [facility];  Service: Gastroenterology;  Laterality: N/A;   ESOPHAGOGASTRODUODENOSCOPY (EGD) WITH PROPOFOL N/A 08/22/2021   Procedure: ESOPHAGOGASTRODUODENOSCOPY (EGD) WITH PROPOFOL;  Surgeon: Rush Landmark Telford Nab., MD;  Location: [facility];  Service: Gastroenterology;  Laterality: N/A;   EUS N/A 06/01/2021   Procedure: UPPER ENDOSCOPIC ULTRASOUND (EUS) RADIAL;  Surgeon: Irving Copas., MD;  Location: [facility];  Service: Gastroenterology;  Laterality: N/A;   EUS N/A 08/22/2021   Procedure: ESOPHAGEAL ENDOSCOPIC ULTRASOUND (EUS) RADIAL;  Surgeon: Rush Landmark Telford Nab., MD;  Location: [facility];  Service: Gastroenterology;  Laterality: N/A;   laceration finger Right    LAPAROSCOPIC VAGINAL HYSTERECTOMY WITH SALPINGECTOMY Bilateral 10/28/2018    Procedure: LAPAROSCOPIC ASSISTED VAGINAL HYSTERECTOMY BILATERAL WITH SALPINGECTOMY;  Surgeon: Rubie Maid, MD;  Location: [facility];  Service: Gynecology;  Laterality: Bilateral;   NEUROLYTIC CELIAC PLEXUS  08/22/2021   Procedure: NEUROLYTIC CELIAC PLEXUS;  Surgeon: Rush Landmark Telford Nab., MD;  Location: [facility];  Service: Gastroenterology;;   PANCREATIC STENT PLACEMENT  08/22/2021   Procedure: PANCREATIC STENT PLACEMENT;  Surgeon: Irving Copas., MD;  Location: [facility];  Service: Gastroenterology;;   REMOVAL OF STONES  08/22/2021   Procedure: REMOVAL OF STONES;  Surgeon: Irving Copas., MD;  Location: [facility];  Service: Gastroenterology;;   [name]  08/22/2021   Procedure: [name];  Surgeon: Irving Copas., MD;  Location: [facility];  Service: Gastroenterology;;   THYROIDECTOMY, PARTIAL     VAGINAL HYSTERECTOMY Bilateral 10/28/2018   Procedure: HYSTERECTOMY VAGINAL WITH BILATERAL SALPINGECTOMY;  Surgeon: Rubie Maid, MD;  Location: [facility];  Service: Gynecology;  Laterality: Bilateral;    Family History  Problem Relation Age of Onset   Hypertension Mother    Diabetes Mother    Brain cancer Father    Aneurysm Brother    Bone cancer Maternal Grandmother    Lung cancer Maternal Grandfather    Cancer Paternal Grandmother    Cancer Paternal Grandfather    Colon cancer Neg Hx    Esophageal cancer Neg Hx    Inflammatory bowel disease Neg Hx    Liver disease Neg Hx    Pancreatic cancer Neg Hx    Rectal cancer Neg Hx    Stomach cancer Neg Hx     Social History   Socioeconomic History   Marital status: Single    Spouse name: Not on file   Number of children: 2   Years of education: 12   Highest education level: Associate degree: academic program  Occupational History   Occupation: [employer]  Tobacco Use   Smoking status: Former    Packs/day: 0.50    Years: 35.00    Total pack years: 17.50    Types: Cigarettes   Smokeless  tobacco: Never  Media planner  Vaping Use: Never used  Substance and Sexual Activity   Alcohol use: Not Currently   Drug use: Yes    Types: Marijuana, Cocaine    Comment: daily   Sexual activity: Not Currently    Birth control/protection: Surgical  Other Topics Concern   Not on file  Social History Narrative   Not on file   Social Determinants of Health   Financial Resource Strain: Low Risk  (10/04/2018)   Overall Financial Resource Strain (CARDIA)    Difficulty of Paying Living Expenses: Not hard at all  Food Insecurity: Food Insecurity Present (01/25/2022)   Hunger Vital Sign    Worried About Running Out of Food in the Last Year: Often true    Ran Out of Food in the Last Year: Often true  Transportation Needs: Unmet Transportation Needs (01/25/2022)   PRAPARE - Transportation    Lack of Transportation (Medical): Yes    Lack of Transportation (Non-Medical): Yes  Physical Activity: Inactive (10/04/2018)   Exercise Vital Sign    Days of Exercise per Week: 0 days    Minutes of Exercise per Session: 0 min  Stress: No Stress Concern Present (10/04/2018)   [place]    Feeling of Stress : Only a little  Social Connections: Unknown (10/04/2018)   Social Connection and Isolation Panel [NHANES]    Frequency of Communication with Friends and Family: More than three times a week    Frequency of Social Gatherings with Friends and Family: More than three times a week    Attends Religious Services: Never    Marine scientist or Organizations: No    Attends Archivist Meetings: Never    Marital Status: Not on file  Intimate Partner Violence: Not At Risk (01/25/2022)   Humiliation, Afraid, Rape, and Kick questionnaire    Fear of Current or Ex-Partner: No    Emotionally Abused: No    Physically Abused: No    Sexually Abused: No    Outpatient Medications Prior to Visit  Medication Sig Dispense Refill    cephALEXin (KEFLEX) 500 MG capsule Take 1 capsule (500 mg total) by mouth every 12 (twelve) hours. 6 capsule 0   insulin aspart (NOVOLOG FLEXPEN) 100 UNIT/ML FlexPen Inject 3 Units into the skin 3 (three) times daily with meals. 15 mL 0   insulin glargine-yfgn (SEMGLEE) 100 UNIT/ML injection Inject 0.06 mLs (6 Units total) into the skin daily. 10 mL 0   lipase/protease/amylase (CREON) 36000 UNITS CPEP capsule Take 2 capsules by mouth 3 times daily with meals 180 capsule 1   mirtazapine (REMERON) 30 MG tablet take one tablet by mouth at bedtime (Patient not taking: Reported on 01/24/2022) 30 tablet 1   pantoprazole (PROTONIX) 40 MG tablet Take one tablet by mouth daily 30 tablet 1   rivaroxaban (XARELTO) 20 MG TABS tablet Take 1 tablet (20 mg total) by mouth daily with supper. 30 tablet 0   No facility-administered medications prior to visit.    No Known Allergies  ROS Review of Systems  Constitutional:  Negative for appetite change, chills and fever.  Respiratory:  Negative for shortness of breath.   Cardiovascular:  Negative for chest pain.  Gastrointestinal:  Negative for abdominal pain, constipation, diarrhea, nausea and vomiting.      Objective:    Physical Exam Constitutional:      Appearance: Normal appearance.  HENT:     Head: Normocephalic and atraumatic.  Eyes:  Conjunctiva/sclera: Conjunctivae normal.  Cardiovascular:     Rate and Rhythm: Normal rate and regular rhythm.     Pulses:          Dorsalis pedis pulses are 2+ on the right side and 2+ on the left side.  Pulmonary:     Effort: Pulmonary effort is normal.     Breath sounds: Normal breath sounds.  Musculoskeletal:     Right lower leg: No edema.     Left lower leg: No edema.     Right foot: Normal range of motion. No deformity, bunion, Charcot foot, foot drop or prominent metatarsal heads.     Left foot: Normal range of motion. No deformity, bunion, Charcot foot, foot drop or prominent metatarsal heads.   Feet:     Right foot:     Protective Sensation: 6 sites tested.  6 sites sensed.     Skin integrity: Skin integrity normal.     Toenail Condition: Right toenails are normal.     Left foot:     Protective Sensation: 6 sites tested.  6 sites sensed.     Skin integrity: Skin integrity normal.     Toenail Condition: Left toenails are normal.  Skin:    General: Skin is warm and dry.  Neurological:     General: No focal deficit present.     Mental Status: She is alert. Mental status is at baseline.  Psychiatric:        Mood and Affect: Mood normal.        Behavior: Behavior normal.     LMP 09/30/2018  Wt Readings from Last 3 Encounters:  11/01/21 128 lb 15.5 oz (58.5 kg)  10/16/21 130 lb (59 kg)  09/02/21 158 lb (71.7 kg)     Health Maintenance Due  Topic Date Due   COVID-19 Vaccine (1) Never done   OPHTHALMOLOGY EXAM  Never done   PAP SMEAR-Modifier  Never done   INFLUENZA VACCINE  Never done    There are no preventive care reminders to display for this patient.  Lab Results  Component Value Date   TSH 2.446 11/04/2021   Lab Results  Component Value Date   WBC 10.8 (H) 01/23/2022   HGB 15.1 (H) 01/23/2022   HCT 47.6 (H) 01/23/2022   MCV 86.4 01/23/2022   PLT 388 01/23/2022   Lab Results  Component Value Date   NA 141 01/23/2022   K 4.4 01/23/2022   CO2 29 01/23/2022   GLUCOSE 171 (H) 01/23/2022   BUN 20 01/23/2022   CREATININE 0.79 01/23/2022   BILITOT 1.1 01/23/2022   ALKPHOS 71 01/23/2022   AST 28 01/23/2022   ALT 17 01/23/2022   PROT 8.6 (H) 01/23/2022   ALBUMIN 4.4 01/23/2022   CALCIUM 9.5 01/23/2022   ANIONGAP 10 01/23/2022   EGFR 110 09/02/2021   GFR 97.61 03/08/2021   No results found for: "CHOL" No results found for: "HDL" No results found for: "LDLCALC" Lab Results  Component Value Date   TRIG 127.0 10/05/2020   No results found for: "CHOLHDL" Lab Results  Component Value Date   HGBA1C 7.7 (H) 11/04/2021      Assessment & Plan:    1. Diabetes mellitus due to underlying condition with hyperglycemia, with long-term current use of insulin ([place]): POC A1c 6.7% today. Continue Levemir 6 units, refilled along with needles. Patient saying she is having an issue affording $10 insulin needles, referral to pharmacy placed to see if she qualifies for any assistance  programs. Discussed how she needs the insulin and this needs to be prioritized. Foot exam and urine microalbumin today. Follow up in 3 months.   - POCT HgB A1C - Insulin Pen Needle (PEN NEEDLES) 30G X 8 MM MISC; 1 each by Does not apply route daily.  Dispense: 90 each; Refill: 3 - insulin detemir (LEVEMIR FLEXPEN) 100 UNIT/ML FlexPen; Inject 6 Units into the skin daily.  Dispense: 15 mL; Refill: 0 - AMB Referral to [facility] - Urine Microalbumin w/creat. ratio  2. Portal vein thrombosis: Has an appointment with vascular in January. I will refill her Xarelto 20 mg until she can get into vascular.   - rivaroxaban (XARELTO) 20 MG TABS tablet; Take 1 tablet (20 mg total) by mouth daily with supper.  Dispense: 90 tablet; Refill: 1  3. Protein-calorie malnutrition, unspecified severity ([place]): Nutrition status improved, gained weight. Recheck CMP today.  - COMPLETE METABOLIC PANEL WITH GFR  Follow-up: No follow-ups on file.   Teodora Medici, DO

## 2022-02-20 ENCOUNTER — Ambulatory Visit: Payer: Medicaid Other | Admitting: Internal Medicine

## 2022-03-08 NOTE — Progress Notes (Unsigned)
   Acute Office Visit  Subjective:     Patient ID: Amy Wolfe, female    DOB: 1975/08/22, 47 y.o.   MRN: [id number]  No chief complaint on file.   HPI Patient is in today for concerns for UTI.   URINARY SYMPTOMS  Dysuria: {Blank single:19197::"yes","no","burning"} Urinary frequency: {Blank single:19197::"yes","no"} Urgency: {Blank single:19197::"yes","no"} Small volume voids: {Blank single:19197::"yes","no"} Symptom severity: {Blank single:19197::"yes","no"} Urinary incontinence: {Blank single:19197::"yes","no"} Foul odor: {Blank single:19197::"yes","no"} Hematuria: {Blank single:19197::"yes","no"} Abdominal pain: {Blank single:19197::"yes","no"} Back pain: {Blank single:19197::"yes","no"} Suprapubic pain/pressure: {Blank single:19197::"yes","no"} Flank pain: {Blank single:19197::"yes","no"} Fever:  {Blank multiple:19196::"yes","no","subjective","low grade"} Vomiting: {Blank single:19197::"yes","no"} Relief with cranberry juice: {Blank single:19197::"yes","no"} Relief with pyridium: {Blank single:19197::"yes","no"} Status: better/worse/stable Previous urinary tract infection: {Blank single:19197::"yes","no"} Recurrent urinary tract infection: {Blank single:19197::"yes","no"} Sexual activity: No sexually active/monogomous/practicing safe sex History of sexually transmitted disease: {Blank single:19197::"yes","no"} Penile discharge: {Blank single:19197::"yes","no"} Treatments attempted: {Blank multiple:19196::"none","antibiotics","pyridium","cranberry","increasing fluids"}    ROS      Objective:    LMP 09/30/2018  {Vitals History (Optional):23777}  Physical Exam  No results found for any visits on 03/09/22.      Assessment & Plan:   Problem List Items Addressed This Visit   None   No orders of the defined types were placed in this encounter.   No follow-ups on file.  Teodora Medici, DO

## 2022-03-09 ENCOUNTER — Ambulatory Visit (INDEPENDENT_AMBULATORY_CARE_PROVIDER_SITE_OTHER): Payer: Medicaid Other | Admitting: Internal Medicine

## 2022-03-09 ENCOUNTER — Encounter: Payer: Self-pay | Admitting: Internal Medicine

## 2022-03-09 ENCOUNTER — Other Ambulatory Visit: Payer: Self-pay

## 2022-03-09 VITALS — BP 122/68 | HR 101 | Temp 98.0°F | Resp 18 | Ht 67.0 in | Wt 135.9 lb

## 2022-03-09 DIAGNOSIS — Z794 Long term (current) use of insulin: Secondary | ICD-10-CM | POA: Diagnosis not present

## 2022-03-09 DIAGNOSIS — E0865 Diabetes mellitus due to underlying condition with hyperglycemia: Secondary | ICD-10-CM

## 2022-03-09 DIAGNOSIS — K860 Alcohol-induced chronic pancreatitis: Secondary | ICD-10-CM

## 2022-03-09 DIAGNOSIS — E1165 Type 2 diabetes mellitus with hyperglycemia: Secondary | ICD-10-CM

## 2022-03-09 DIAGNOSIS — N3001 Acute cystitis with hematuria: Secondary | ICD-10-CM | POA: Diagnosis not present

## 2022-03-09 DIAGNOSIS — F332 Major depressive disorder, recurrent severe without psychotic features: Secondary | ICD-10-CM

## 2022-03-09 DIAGNOSIS — R309 Painful micturition, unspecified: Secondary | ICD-10-CM

## 2022-03-09 DIAGNOSIS — I81 Portal vein thrombosis: Secondary | ICD-10-CM

## 2022-03-09 LAB — POCT GLYCOSYLATED HEMOGLOBIN (HGB A1C): Hemoglobin A1C: 7.2 % — AB (ref 4.0–5.6)

## 2022-03-09 LAB — POCT URINALYSIS DIPSTICK
Bilirubin, UA: NEGATIVE
Glucose, UA: POSITIVE — AB
Ketones, UA: POSITIVE
Nitrite, UA: POSITIVE
Odor: NORMAL
Protein, UA: NEGATIVE
Spec Grav, UA: 1.015 (ref 1.010–1.025)
Urobilinogen, UA: 0.2 E.U./dL
pH, UA: 6.5 (ref 5.0–8.0)

## 2022-03-09 MED ORDER — BLOOD GLUCOSE MONITOR SYSTEM W/DEVICE KIT
1.0000 | PACK | Freq: Three times a day (TID) | 0 refills | Status: AC
  Filled 2022-03-09 – 2022-05-03 (×2): qty 1, 1d supply, fill #0

## 2022-03-09 MED ORDER — RIVAROXABAN 20 MG PO TABS
20.0000 mg | ORAL_TABLET | Freq: Every day | ORAL | 1 refills | Status: AC
  Filled 2022-03-09: qty 30, 30d supply, fill #0
  Filled 2022-05-03: qty 30, 30d supply, fill #1

## 2022-03-09 MED ORDER — SULFAMETHOXAZOLE-TRIMETHOPRIM 800-160 MG PO TABS
1.0000 | ORAL_TABLET | Freq: Two times a day (BID) | ORAL | 0 refills | Status: AC
  Filled 2022-03-09: qty 6, 3d supply, fill #0

## 2022-03-09 MED ORDER — LANCET DEVICE MISC
1.0000 | Freq: Three times a day (TID) | 0 refills | Status: AC
  Filled 2022-03-09: qty 1, 30d supply, fill #0

## 2022-03-09 MED ORDER — PANCRELIPASE (LIP-PROT-AMYL) 36000-114000 UNITS PO CPEP
72000.0000 [IU] | ORAL_CAPSULE | Freq: Three times a day (TID) | ORAL | 1 refills | Status: AC
  Filled 2022-03-09: qty 180, 30d supply, fill #0
  Filled 2022-05-03 (×2): qty 180, 30d supply, fill #1

## 2022-03-09 MED ORDER — PANTOPRAZOLE SODIUM 40 MG PO TBEC
40.0000 mg | DELAYED_RELEASE_TABLET | Freq: Every day | ORAL | 1 refills | Status: AC
  Filled 2022-03-09: qty 30, 30d supply, fill #0
  Filled 2022-05-03: qty 30, 30d supply, fill #1

## 2022-03-09 MED ORDER — FLUCONAZOLE 150 MG PO TABS
150.0000 mg | ORAL_TABLET | Freq: Once | ORAL | 0 refills | Status: AC
  Filled 2022-03-09: qty 1, 1d supply, fill #0

## 2022-03-09 MED ORDER — MIRTAZAPINE 30 MG PO TABS
30.0000 mg | ORAL_TABLET | Freq: Every day | ORAL | 1 refills | Status: AC
  Filled 2022-03-09: qty 30, 30d supply, fill #0
  Filled 2022-05-03: qty 30, 30d supply, fill #1

## 2022-03-09 MED ORDER — BLOOD GLUCOSE TEST VI STRP
1.0000 | ORAL_STRIP | Freq: Three times a day (TID) | 3 refills | Status: AC
  Filled 2022-03-09 – 2022-05-03 (×2): qty 100, 34d supply, fill #0

## 2022-03-09 MED ORDER — ONDANSETRON 8 MG PO TBDP
8.0000 mg | ORAL_TABLET | Freq: Three times a day (TID) | ORAL | 0 refills | Status: AC | PRN
  Filled 2022-03-09: qty 20, 7d supply, fill #0

## 2022-03-09 MED ORDER — ACCU-CHEK SOFTCLIX LANCETS MISC
1.0000 | Freq: Three times a day (TID) | 0 refills | Status: AC
  Filled 2022-03-09: qty 100, 30d supply, fill #0
  Filled 2022-05-03: qty 100, 34d supply, fill #0

## 2022-03-09 MED ORDER — LANTUS SOLOSTAR 100 UNIT/ML ~~LOC~~ SOPN
4.0000 [IU] | PEN_INJECTOR | Freq: Every day | SUBCUTANEOUS | 0 refills | Status: AC
  Filled 2022-03-09: qty 10, 90d supply, fill #0
  Filled 2022-03-10: qty 3, 75d supply, fill #0
  Filled 2022-05-03: qty 15, 90d supply, fill #0

## 2022-03-09 NOTE — Patient Instructions (Addendum)
It was great seeing you today!  Plan discussed at today's visit: -A1c 7.2% -Restart insulin at 4 units daily -Check blood sugar in the morning and 1 hour after largest meal, let me know if it is going too low (<90).  -Restart all other medications -Antibiotic for UTI - take twice a day for 3 days -Yeast infection medication sent as well -Referral to social work and Psychiatry placed    Follow up in: 2 weeks  Take care and let us know if you have any questions or concerns prior to your next visit.  Dr. Rosana Berger

## 2022-03-10 ENCOUNTER — Other Ambulatory Visit: Payer: Self-pay

## 2022-03-11 LAB — URINE CULTURE
MICRO NUMBER:: 14664130
SPECIMEN QUALITY:: ADEQUATE

## 2022-03-13 ENCOUNTER — Other Ambulatory Visit: Payer: Self-pay | Admitting: Internal Medicine

## 2022-03-13 ENCOUNTER — Other Ambulatory Visit: Payer: Self-pay

## 2022-03-13 DIAGNOSIS — B379 Candidiasis, unspecified: Secondary | ICD-10-CM

## 2022-03-13 MED ORDER — FLUCONAZOLE 150 MG PO TABS
150.0000 mg | ORAL_TABLET | Freq: Once | ORAL | 0 refills | Status: AC
  Filled 2022-03-13: qty 3, 3d supply, fill #0

## 2022-03-14 ENCOUNTER — Ambulatory Visit: Payer: Self-pay

## 2022-03-14 ENCOUNTER — Ambulatory Visit: Payer: Medicaid Other | Admitting: Internal Medicine

## 2022-03-14 NOTE — Telephone Encounter (Signed)
  Chief Complaint: Vomiting, diarrhea - pt thinks this is pancreatitis Symptoms: above Frequency: Since starting medications on 03/09/2022 Pertinent Negatives: Patient denies  Disposition: [x] ED /[] Urgent Care (no appt availability in office) / [] Appointment(In office/virtual)/ []  [facility]/ [] Home Care/ [x] Refused Recommended Disposition /[]  Mobile Bus/ []  Follow-up with PCP Additional Notes: Pt states that since she started all the medication on 03/09/2022, she has felt terrible. She has kept nothing down and anything that has stayed has resulted in diarrhea. Pt thinks that this is from all the medication that was started on 03/09/2022. Pt also thinks that this is pancreatitis. Pt sounds weak. She also sounds like she is in a lot of pain.   Recommended EMS. PT will wait until her mother comes home and then will see how she feels. Mother is expected shortly.  Made appt for this afternoon. PT is unsure if she will come for that.   Please advise.   Summary: Medication Reaction   Patient states that since she has started taking her medications on 03/09/22 she has been sick with vomiting and diarrhea. Patient said she was going to stop taking all her medications.     Reason for Disposition  Sounds like a life-threatening emergency to the triager  Answer Assessment - Initial Assessment Questions 1. VOMITING SEVERITY: "How many times have you vomited in the past 24 hours?"     - MILD:  1 - 2 times/day    - MODERATE: 3 - 5 times/day, decreased oral intake without significant weight loss or symptoms of dehydration    - SEVERE: 6 or more times/day, vomits everything or nearly everything, with significant weight loss, symptoms of dehydration      3 times at least  2. ONSET: "When did the vomiting begin?"      03/09/2022 3. FLUIDS: "What fluids or food have you vomited up today?" "Have you been able to keep any fluids down?"     no 4. ABDOMEN PAIN: "Are your having any abdomen  pain?" If Yes : "How bad is it and what does it feel like?" (e.g., crampy, dull, intermittent, constant)      pancreas 5. DIARRHEA: "Is there any diarrhea?" If Yes, ask: "How many times today?"      yes 6. CONTACTS: "Is there anyone else in the family with the same symptoms?"      no 7. CAUSE: "What do you think is causing your vomiting?"     Pancreatitis 8. HYDRATION STATUS: "Any signs of dehydration?" (e.g., dry mouth [not only dry lips], too weak to stand) "When did you last urinate?"      9. OTHER SYMPTOMS: "Do you have any other symptoms?" (e.g., fever, headache, vertigo, vomiting blood or coffee grounds, recent head injury)  Protocols used: Vomiting-A-AH

## 2022-03-14 NOTE — Telephone Encounter (Signed)
Called pt notified her she will need to cal her GI specialist who manages this or go to ER.  She informed she has no ride anyway and to cancel appt

## 2022-03-20 NOTE — Progress Notes (Deleted)
Established Patient Office Visit  Subjective:  Patient ID: Amy Wolfe, female    DOB: 1975/04/24  Age: 47 y.o. MRN: [id number]  CC:  No chief complaint on file.   HPI [name] presents for follow up.   Alcohol Induced Pancreatitis: -Stopped drinking about 1 year ago -Has flares of severe cramping/sharp epigastric pains that radiate to the back on a monthly basis -Pain has been worse with eating any type of food but now pain is much better, has been eating again -Following with GI at [facility]- plan discussed: stop marijuana, tobacco use, PPI increase to BID, autoimmune work up. -Recently underwent EUS/ERCP 08/22/21 for PD cannulation and stenting/stone removal  -Currently on Creon, Protonix   Diabetes, new onset: -Last A1c 7.2% 3/24 -Medications: Restarted glargine 4 units daily -Failed Meds: Tried Metformin 500 mg but could not tolerate -Checking BG at home: <230 but out of insulin -Diet: improved now that pain is better controlled  -Exercise: None -Eye exam: Due -Foot exam: UTD 9/23 -Microalbumin: UTD 9/23 -Statin: No -PNA vaccine: Due -Denies symptoms of hypoglycemia, polyuria, polydipsia, numbness extremities, foot ulcers/trauma.   Chronic Portal Vein Thrombosis: -Had been on Xarelto 20 mg daily but has been out recently, needs refills   MDD: -Following with Psychiatry, hospital discharge summary reviewed from 01/31/22 -Mood status: {Blank single:19197::"controlled","uncontrolled","better","worse","exacerbated","stable"} -Current treatment: Remeron 30 mg -Satisfied with current treatment?: {Blank single:19197::"yes","no"} -Symptom severity: {Blank single:19197::"mild","moderate","severe"}  -Duration of current treatment : {Blank single:19197::"chronic","months","years"} -Side effects: {Blank single:19197::"yes","no"} Medication compliance: {Blank single:19197::"excellent compliance","good compliance","fair compliance","poor  compliance"} Psychotherapy/counseling: {Blank single:19197::"yes","no"} {Blank single:19197::"current","in the past"} Previous psychiatric medications: {Blank multiple:19196::"abilify","amitryptiline","buspar","celexa","cymbalta","depakote","effexor","lamictal","lexapro","lithium","nortryptiline","paxil","prozac","pristiq (desvenlafaxine","seroquel","wellbutrin","zoloft","zyprexa"} Depressed mood: {Blank single:19197::"yes","no"} Anxious mood: {Blank single:19197::"yes","no"} Anhedonia: {Blank single:19197::"yes","no"} Significant weight loss or gain: {Blank single:19197::"yes","no"} Insomnia: {Blank single:19197::"yes","no"} {Blank single:19197::"hard to fall asleep","hard to stay asleep"} Fatigue: {Blank single:19197::"yes","no"} Feelings of worthlessness or guilt: {Blank single:19197::"yes","no"} Impaired concentration/indecisiveness: {Blank single:19197::"yes","no"} Suicidal ideations: {Blank single:19197::"yes","no"} Hopelessness: {Blank single:19197::"yes","no"} Crying spells: {Blank single:19197::"yes","no"}    03/09/2022    1:12 PM 09/02/2021   10:42 AM 03/28/2021   11:29 AM 03/03/2021    3:10 PM 02/24/2021    1:47 PM  Depression screen PHQ 2/9  Decreased Interest 0 0 0 0 0  Down, Depressed, Hopeless 0 0 0 0 0  PHQ - 2 Score 0 0 0 0 0  Altered sleeping 0 0 0 0 0  Tired, decreased energy 0 0 0 0 0  Change in appetite 0 0 0 0 0  Feeling bad or failure about yourself  0 0 0 0 0  Trouble concentrating 0 0 0 0 0  Moving slowly or fidgety/restless 0 0 0 0 0  Suicidal thoughts 0 0 0 0 0  PHQ-9 Score 0 0 0 0 0  Difficult doing work/chores Not difficult at all Not difficult at all  Not difficult at all Not difficult at all     Past Medical History:  Diagnosis Date   Abdominal pain    Anxiety    Coronary artery disease    Diabetes mellitus without complication (HCC)    Dyspnea    GERD (gastroesophageal reflux disease)    H/O blood clots    Hypertension    Liver abscess     Pancreatitis 2019   PONV (postoperative nausea and vomiting) 11/07/2018   Thyroid disease    Uterine fibroid     Past Surgical History:  Procedure Laterality Date   BILIARY BRUSHING  08/22/2021   Procedure: BILIARY BRUSHING;  Surgeon: Irving Copas., MD;  Location:  [facility];  Service: Gastroenterology;;   BIOPSY  06/01/2021   Procedure: BIOPSY;  Surgeon: Irving Copas., MD;  Location: [facility];  Service: Gastroenterology;;   CESAREAN SECTION     EMBOLIZATION N/A 06/14/2018   Procedure: Uterine Embolization;  Surgeon: Algernon Huxley, MD;  Location: [facility];  Service: Cardiovascular;  Laterality: N/A;   ENDOSCOPIC RETROGRADE CHOLANGIOPANCREATOGRAPHY (ERCP) WITH PROPOFOL N/A 08/22/2021   Procedure: ENDOSCOPIC RETROGRADE CHOLANGIOPANCREATOGRAPHY (ERCP) WITH PROPOFOL;  Surgeon: Rush Landmark Telford Nab., MD;  Location: [facility];  Service: Gastroenterology;  Laterality: N/A;   ESOPHAGOGASTRODUODENOSCOPY (EGD) WITH PROPOFOL N/A 06/01/2021   Procedure: ESOPHAGOGASTRODUODENOSCOPY (EGD) WITH PROPOFOL;  Surgeon: Rush Landmark Telford Nab., MD;  Location: [facility];  Service: Gastroenterology;  Laterality: N/A;   ESOPHAGOGASTRODUODENOSCOPY (EGD) WITH PROPOFOL N/A 08/22/2021   Procedure: ESOPHAGOGASTRODUODENOSCOPY (EGD) WITH PROPOFOL;  Surgeon: Rush Landmark Telford Nab., MD;  Location: [facility];  Service: Gastroenterology;  Laterality: N/A;   EUS N/A 06/01/2021   Procedure: UPPER ENDOSCOPIC ULTRASOUND (EUS) RADIAL;  Surgeon: Irving Copas., MD;  Location: [facility];  Service: Gastroenterology;  Laterality: N/A;   EUS N/A 08/22/2021   Procedure: ESOPHAGEAL ENDOSCOPIC ULTRASOUND (EUS) RADIAL;  Surgeon: Rush Landmark Telford Nab., MD;  Location: [facility];  Service: Gastroenterology;  Laterality: N/A;   laceration finger Right    LAPAROSCOPIC VAGINAL HYSTERECTOMY WITH SALPINGECTOMY Bilateral 10/28/2018   Procedure: LAPAROSCOPIC ASSISTED VAGINAL HYSTERECTOMY  BILATERAL WITH SALPINGECTOMY;  Surgeon: Rubie Maid, MD;  Location: [facility];  Service: Gynecology;  Laterality: Bilateral;   NEUROLYTIC CELIAC PLEXUS  08/22/2021   Procedure: NEUROLYTIC CELIAC PLEXUS;  Surgeon: Rush Landmark Telford Nab., MD;  Location: [facility];  Service: Gastroenterology;;   PANCREATIC STENT PLACEMENT  08/22/2021   Procedure: PANCREATIC STENT PLACEMENT;  Surgeon: Irving Copas., MD;  Location: [facility];  Service: Gastroenterology;;   REMOVAL OF STONES  08/22/2021   Procedure: REMOVAL OF STONES;  Surgeon: Irving Copas., MD;  Location: [facility];  Service: Gastroenterology;;   [name]  08/22/2021   Procedure: [name];  Surgeon: Irving Copas., MD;  Location: [facility];  Service: Gastroenterology;;   THYROIDECTOMY, PARTIAL     VAGINAL HYSTERECTOMY Bilateral 10/28/2018   Procedure: HYSTERECTOMY VAGINAL WITH BILATERAL SALPINGECTOMY;  Surgeon: Rubie Maid, MD;  Location: [facility];  Service: Gynecology;  Laterality: Bilateral;    Family History  Problem Relation Age of Onset   Hypertension Mother    Diabetes Mother    Brain cancer Father    Aneurysm Brother    Bone cancer Maternal Grandmother    Lung cancer Maternal Grandfather    Cancer Paternal Grandmother    Cancer Paternal Grandfather    Colon cancer Neg Hx    Esophageal cancer Neg Hx    Inflammatory bowel disease Neg Hx    Liver disease Neg Hx    Pancreatic cancer Neg Hx    Rectal cancer Neg Hx    Stomach cancer Neg Hx     Social History   Socioeconomic History   Marital status: Single    Spouse name: Not on file   Number of children: 2   Years of education: 12   Highest education level: Associate degree: academic program  Occupational History   Occupation: [employer]  Tobacco Use   Smoking status: Former    Packs/day: 0.50    Years: 35.00    Additional pack years: 0.00    Total pack years: 17.50    Types: Cigarettes   Smokeless tobacco: Never  Vaping  Use   Vaping Use:  Never used  Substance and Sexual Activity   Alcohol use: Not Currently   Drug use: Yes    Types: Marijuana, Cocaine    Comment: daily   Sexual activity: Not Currently    Birth control/protection: Surgical  Other Topics Concern   Not on file  Social History Narrative   Not on file   Social Determinants of Health   Financial Resource Strain: Low Risk  (10/04/2018)   Overall Financial Resource Strain (CARDIA)    Difficulty of Paying Living Expenses: Not hard at all  Food Insecurity: Food Insecurity Present (01/25/2022)   Hunger Vital Sign    Worried About Running Out of Food in the Last Year: Often true    Ran Out of Food in the Last Year: Often true  Transportation Needs: Unmet Transportation Needs (01/25/2022)   PRAPARE - Transportation    Lack of Transportation (Medical): Yes    Lack of Transportation (Non-Medical): Yes  Physical Activity: Inactive (10/04/2018)   Exercise Vital Sign    Days of Exercise per Week: 0 days    Minutes of Exercise per Session: 0 min  Stress: No Stress Concern Present (10/04/2018)   [place]    Feeling of Stress : Only a little  Social Connections: Unknown (10/04/2018)   Social Connection and Isolation Panel [NHANES]    Frequency of Communication with Friends and Family: More than three times a week    Frequency of Social Gatherings with Friends and Family: More than three times a week    Attends Religious Services: Never    Marine scientist or Organizations: No    Attends Archivist Meetings: Never    Marital Status: Not on file  Intimate Partner Violence: Not At Risk (01/25/2022)   Humiliation, Afraid, Rape, and Kick questionnaire    Fear of Current or Ex-Partner: No    Emotionally Abused: No    Physically Abused: No    Sexually Abused: No    Outpatient Medications Prior to Visit  Medication Sig Dispense Refill   Accu-Chek Softclix Lancets  lancets 1 each in the morning, at noon, and at bedtime. 100 each 0   Blood Glucose Monitoring Suppl (BLOOD GLUCOSE MONITOR SYSTEM) w/Device KIT 1 each by Does not apply route in the morning, at noon, and at bedtime. 1 kit 0   Glucose Blood (BLOOD GLUCOSE TEST STRIPS) STRP 1 each by In Vitro route in the morning, at noon, and at bedtime. May substitute to any manufacturer covered by patient's insurance. 100 strip 3   insulin aspart (NOVOLOG FLEXPEN) 100 UNIT/ML FlexPen Inject 3 Units into the skin 3 (three) times daily with meals. (Patient not taking: Reported on 03/09/2022) 15 mL 0   insulin glargine-yfgn (SEMGLEE) 100 UNIT/ML Pen Inject 4 Units into the skin daily. 15 mL 0   Lancet Device MISC 1 each by Does not apply route in the morning, at noon, and at bedtime. May substitute to any manufacturer covered by patient's insurance. 1 each 0   lipase/protease/amylase (CREON) 36000 UNITS CPEP capsule Take 2 capsules (72,000 Units total) by mouth 3 (three) times daily with meals. 180 capsule 1   mirtazapine (REMERON) 30 MG tablet Take 1 tablet (30 mg total) by mouth at bedtime. 30 tablet 1   ondansetron (ZOFRAN-ODT) 8 MG disintegrating tablet Take 1 tablet (8 mg total) by mouth every 8 (eight) hours as needed for nausea or vomiting. 20 tablet 0   pantoprazole (PROTONIX) 40 MG  tablet Take 1 tablet (40 mg total) by mouth daily. 30 tablet 1   rivaroxaban (XARELTO) 20 MG TABS tablet Take 1 tablet (20 mg total) by mouth daily with supper. 30 tablet 1   No facility-administered medications prior to visit.    No Known Allergies  ROS Review of Systems  Constitutional:  Negative for appetite change, chills and fever.  Respiratory:  Negative for shortness of breath.   Cardiovascular:  Negative for chest pain.  Gastrointestinal:  Negative for abdominal pain, constipation, diarrhea, nausea and vomiting.      Objective:    Physical Exam Constitutional:      Appearance: Normal appearance.  HENT:      Head: Normocephalic and atraumatic.  Eyes:     Conjunctiva/sclera: Conjunctivae normal.  Cardiovascular:     Rate and Rhythm: Normal rate and regular rhythm.     Pulses:          Dorsalis pedis pulses are 2+ on the right side and 2+ on the left side.  Pulmonary:     Effort: Pulmonary effort is normal.     Breath sounds: Normal breath sounds.  Musculoskeletal:     Right lower leg: No edema.     Left lower leg: No edema.     Right foot: Normal range of motion. No deformity, bunion, Charcot foot, foot drop or prominent metatarsal heads.     Left foot: Normal range of motion. No deformity, bunion, Charcot foot, foot drop or prominent metatarsal heads.  Feet:     Right foot:     Protective Sensation: 6 sites tested.  6 sites sensed.     Skin integrity: Skin integrity normal.     Toenail Condition: Right toenails are normal.     Left foot:     Protective Sensation: 6 sites tested.  6 sites sensed.     Skin integrity: Skin integrity normal.     Toenail Condition: Left toenails are normal.  Skin:    General: Skin is warm and dry.  Neurological:     General: No focal deficit present.     Mental Status: She is alert. Mental status is at baseline.  Psychiatric:        Mood and Affect: Mood normal.        Behavior: Behavior normal.     LMP 09/30/2018  Wt Readings from Last 3 Encounters:  03/09/22 135 lb 14.4 oz (61.6 kg)  11/01/21 128 lb 15.5 oz (58.5 kg)  10/16/21 130 lb (59 kg)     Health Maintenance Due  Topic Date Due   OPHTHALMOLOGY EXAM  Never done   PAP SMEAR-Modifier  Never done    There are no preventive care reminders to display for this patient.  Lab Results  Component Value Date   TSH 2.446 11/04/2021   Lab Results  Component Value Date   WBC 10.8 (H) 01/23/2022   HGB 15.1 (H) 01/23/2022   HCT 47.6 (H) 01/23/2022   MCV 86.4 01/23/2022   PLT 388 01/23/2022   Lab Results  Component Value Date   NA 141 01/23/2022   K 4.4 01/23/2022   CO2 29 01/23/2022    GLUCOSE 171 (H) 01/23/2022   BUN 20 01/23/2022   CREATININE 0.79 01/23/2022   BILITOT 1.1 01/23/2022   ALKPHOS 71 01/23/2022   AST 28 01/23/2022   ALT 17 01/23/2022   PROT 8.6 (H) 01/23/2022   ALBUMIN 4.4 01/23/2022   CALCIUM 9.5 01/23/2022   ANIONGAP 10 01/23/2022  EGFR 110 09/02/2021   GFR 97.61 03/08/2021   No results found for: "CHOL" No results found for: "HDL" No results found for: "LDLCALC" Lab Results  Component Value Date   TRIG 127.0 10/05/2020   No results found for: "CHOLHDL" Lab Results  Component Value Date   HGBA1C 7.2 (A) 03/09/2022      Assessment & Plan:   1. Diabetes mellitus due to underlying condition with hyperglycemia, with long-term current use of insulin ([place]): POC A1c 6.7% today. Continue Levemir 6 units, refilled along with needles. Patient saying she is having an issue affording $10 insulin needles, referral to pharmacy placed to see if she qualifies for any assistance programs. Discussed how she needs the insulin and this needs to be prioritized. Foot exam and urine microalbumin today. Follow up in 3 months.   - POCT HgB A1C - Insulin Pen Needle (PEN NEEDLES) 30G X 8 MM MISC; 1 each by Does not apply route daily.  Dispense: 90 each; Refill: 3 - insulin detemir (LEVEMIR FLEXPEN) 100 UNIT/ML FlexPen; Inject 6 Units into the skin daily.  Dispense: 15 mL; Refill: 0 - AMB Referral to [facility] - Urine Microalbumin w/creat. ratio  2. Portal vein thrombosis: Has an appointment with vascular in January. I will refill her Xarelto 20 mg until she can get into vascular.   - rivaroxaban (XARELTO) 20 MG TABS tablet; Take 1 tablet (20 mg total) by mouth daily with supper.  Dispense: 90 tablet; Refill: 1  3. Protein-calorie malnutrition, unspecified severity ([place]): Nutrition status improved, gained weight. Recheck CMP today.  - COMPLETE METABOLIC PANEL WITH GFR  Follow-up: No follow-ups on file.   Teodora Medici, DO

## 2022-03-21 ENCOUNTER — Other Ambulatory Visit: Payer: Self-pay

## 2022-03-21 ENCOUNTER — Ambulatory Visit: Payer: Medicaid Other | Admitting: Internal Medicine

## 2022-04-26 ENCOUNTER — Other Ambulatory Visit: Payer: Medicaid Other

## 2022-04-26 NOTE — Patient Instructions (Signed)
Visit Information  The Patient                                              was given information about Medicaid Managed Care team care coordination services and consented to engagement with the [facility] Managed Care team.   Social Worker will follow up in 7 days.   Abelino Derrick, MHA [facility]  Managed [facility] Social Worker [phone]

## 2022-04-26 NOTE — Patient Outreach (Signed)
Medicaid Managed Care Social Work Note  04/26/2022 Name:  Amy Wolfe MRN:  [id number] DOB:  24-Dec-1975  Amy Wolfe is an 47 y.o. year old female who is a primary patient of Margarita Mail, DO.  The Medicaid Managed Care Coordination team was consulted for assistance with:  Community Resources   [name] was given information about Medicaid Managed Care Coordination team services today. [name] Patient agreed to services and verbal consent obtained.  Engaged with patient  for by telephone forinitial visit in response to referral for case management and/or care coordination services.   Assessments/Interventions:  Review of past medical history, allergies, medications, health status, including review of consultants reports, laboratory and other test data, was performed as part of comprehensive evaluation and provision of chronic care management services.  SDOH: (Social Determinant of Health) assessments and interventions performed: SDOH Interventions    Flowsheet Row ED to Hosp-Admission (Discharged) from 10/31/2021 in [facility] 1C MEDICAL TELEMETRY  SDOH Interventions   Housing Interventions Patient Refused     BSW completed a telephone outreach with patient, she stated she is homeless living in a tent. She keeps getting denied for foodstamps and goes to a church on [address] for food, showers, and to [employer]. Patient states she is worker with a case worker with Amy Wolfe stated if patient goes to [facility] she can help her more. Patient is looking for transitioal housing. Resources BSW provided patient has already tried or been too. BSW will contact Amy Wolfe [phone] ext [phone] or [phone] to collobrate on next steps for patient since she is already connected with them.  Advanced Directives Status:  Not addressed in this encounter.  Care Plan                 No Known Allergies  Medications Reviewed  Today     Reviewed by Margarita Mail, DO (Physician) on 03/09/22 at 1649  Med List Status: <None>   Medication Order Taking? Sig Documenting Provider Last Dose Status Informant  Accu-Chek Softclix Lancets lancets [id number] Yes 1 each in the morning, at noon, and at bedtime. Margarita Mail, DO  Active   Blood Glucose Monitoring Suppl (BLOOD GLUCOSE MONITOR SYSTEM) w/Device KIT [id number] Yes 1 each by Does not apply route in the morning, at noon, and at bedtime. Margarita Mail, DO  Active   fluconazole (DIFLUCAN) 150 MG tablet [id number] Yes Take 1 tablet (150 mg total) by mouth once for 1 dose. Margarita Mail, DO  Active   Glucose Blood (BLOOD GLUCOSE TEST STRIPS) STRP [id number] Yes 1 each by In Vitro route in the morning, at noon, and at bedtime. May substitute to any manufacturer covered by patient's insurance. Margarita Mail, DO  Active   insulin aspart (NOVOLOG FLEXPEN) 100 UNIT/ML FlexPen [id number] No Inject 3 Units into the skin 3 (three) times daily with meals.  Patient not taking: Reported on 03/09/2022   Clapacs, Jackquline Denmark, MD Not Taking Active Mother, Pharmacy Records, Other  insulin glargine-yfgn [facility]) 100 UNIT/ML injection [id number]  Inject 0.04 mLs (4 Units total) into the skin daily. Margarita Mail, DO  Active   Lancet Device MISC [id number] Yes 1 each by Does not apply route in the morning, at noon, and at bedtime. May substitute to any manufacturer covered by patient's insurance. Margarita Mail, DO  Active   lipase/protease/amylase (CREON) 36000 UNITS CPEP capsule [id number]  Take 2 capsules (72,000 Units total) by mouth 3 (three) times daily with meals.  Margarita Mail, DO  Active   mirtazapine (REMERON) 30 MG tablet [id number]  Take 1 tablet (30 mg total) by mouth at bedtime. Margarita Mail, DO  Active   ondansetron (ZOFRAN-ODT) 8 MG disintegrating tablet [id number] Yes Take 1 tablet (8 mg total) by mouth every 8 (eight) hours as needed for nausea or  vomiting. Margarita Mail, DO  Active   pantoprazole (PROTONIX) 40 MG tablet [id number]  Take 1 tablet (40 mg total) by mouth daily. Margarita Mail, DO  Active   rivaroxaban (XARELTO) 20 MG TABS tablet [id number]  Take 1 tablet (20 mg total) by mouth daily with supper. Margarita Mail, DO  Active   sulfamethoxazole-trimethoprim (BACTRIM DS) 800-160 MG tablet [id number] Yes Take 1 tablet by mouth 2 (two) times daily for 3 days. 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   Follow up:  Patient agrees to Care Plan and Follow-up.  Plan: The Managed Medicaid care management team will reach out to the patient again over the next 7 days.  Date/time of next scheduled Social Work care management/care coordination outreach:  05/05/22  Gus Puma, Kenard Gower, [facility]  Managed [facility] Social Worker [phone]

## 2022-04-28 ENCOUNTER — Encounter: Payer: Self-pay | Admitting: Obstetrics and Gynecology

## 2022-04-28 ENCOUNTER — Other Ambulatory Visit: Payer: Medicaid Other | Admitting: Obstetrics and Gynecology

## 2022-04-28 NOTE — Patient Instructions (Signed)
Hi Ms. Riche, thank you for speaking with me today.  Ms. Schrier was given information about Medicaid Managed Care team care coordination services and verbally consented to engagement with the [facility] Managed Care team.   Ms. Margerum - following are the goals we discussed in your visit today:   Goals Addressed    Timeframe:  Long-Range Goal Priority:  High Start Date:   04/28/22                          Expected End Date: ongoing                      Follow Up Date 05/30/22    - practice safe sex - schedule appointment for vaccines needed due to my age or health - schedule recommended health tests (blood work, mammogram, colonoscopy, pap test) - schedule and keep appointment for annual check-up    Why is this important?   Screening tests can find diseases early when they are easier to treat.  Your doctor or nurse will talk with you about which tests are important for you.  Getting shots for common diseases like the flu and shingles will help prevent them.    Patient verbalizes understanding of instructions and care plan provided today and agrees to view in MyChart. Active MyChart status and patient understanding of how to access instructions and care plan via MyChart confirmed with patient.     The Managed Medicaid care management team will reach out to the patient again over the next 30 business  days.  The  Patient has been provided with contact information for the Managed Medicaid care management team and has been advised to call with any health related questions or concerns.   Kathi Der RN, BSN [place]  [employer], production - Managed Medicaid High Risk [phone]

## 2022-04-28 NOTE — Patient Outreach (Signed)
Medicaid Managed Care   Nurse Care Manager Note  04/28/2022 Name:  Amy Wolfe MRN:  [id number] DOB:  October 24, 1975  Amy Wolfe is an 47 y.o. year old female who is a primary patient of Margarita Mail, DO.  [facility] Managed Care Coordination team was consulted for assistance with:    Chronic healthcare management needs,  HTN, DM, chronic pancreatitis, MDD, substance abuse   Amy Wolfe was given information about Medicaid Managed Care Coordination team services today. [name] Patient agreed to services and verbal consent obtained.  Engaged with patient by telephone for initial visit in response to provider referral for case management and/or care coordination services.   Assessments/Interventions:  Review of past medical history, allergies, medications, health status, including review of consultants reports, laboratory and other test data, was performed as part of comprehensive evaluation and provision of chronic care management services.  SDOH (Social Determinants of Health) assessments and interventions performed: SDOH Interventions    Flowsheet Row Patient Outreach Telephone from 04/28/2022 in [place] [facility] ED to Hosp-Admission (Discharged) from 10/31/2021 in [facility] 1C MEDICAL TELEMETRY  SDOH Interventions    Housing Interventions -- Patient Refused  Stress Interventions Other (Comment)  [BSW and LCSW referrals placed] --     Care Plan  No Known Allergies  Medications Reviewed Today     Reviewed by Danie Chandler, RN (Registered Nurse) on 04/28/22 at 1338  Med List Status: <None>   Medication Order Taking? Sig Documenting Provider Last Dose Status Informant  Blood Glucose Monitoring Suppl (BLOOD GLUCOSE MONITOR SYSTEM) w/Device KIT [id number] No 1 each by Does not apply route in the morning, at noon, and at bedtime.  Patient not taking: Reported on 04/28/2022   Margarita Mail, DO Not Taking  Active   Glucose Blood (BLOOD GLUCOSE TEST STRIPS) STRP [id number] No 1 each by In Vitro route in the morning, at noon, and at bedtime. May substitute to any manufacturer covered by patient's insurance.  Patient not taking: Reported on 04/28/2022   Margarita Mail, DO Not Taking Active   insulin aspart (NOVOLOG FLEXPEN) 100 UNIT/ML FlexPen [id number] No Inject 3 Units into the skin 3 (three) times daily with meals.  Patient not taking: Reported on 03/09/2022   Clapacs, Jackquline Denmark, MD Not Taking Active Mother, Pharmacy Records, Other  insulin glargine-yfgn [facility]) 100 UNIT/ML Pen [id number] No Inject 4 Units into the skin daily.  Patient not taking: Reported on 04/28/2022   Margarita Mail, DO Not Taking Active   lipase/protease/amylase (CREON) 36000 UNITS CPEP capsule [id number] No Take 2 capsules (72,000 Units total) by mouth 3 (three) times daily with meals.  Patient not taking: Reported on 04/28/2022   Margarita Mail, DO Not Taking Active   mirtazapine (REMERON) 30 MG tablet [id number] No Take 1 tablet (30 mg total) by mouth at bedtime.  Patient not taking: Reported on 04/28/2022   Margarita Mail, DO Not Taking Active   ondansetron (ZOFRAN-ODT) 8 MG disintegrating tablet [id number] No Take 1 tablet (8 mg total) by mouth every 8 (eight) hours as needed for nausea or vomiting.  Patient not taking: Reported on 04/28/2022   Margarita Mail, DO Not Taking Active   pantoprazole (PROTONIX) 40 MG tablet [id number] No Take 1 tablet (40 mg total) by mouth daily.  Patient not taking: Reported on 04/28/2022   Margarita Mail, DO Not Taking Active   rivaroxaban (XARELTO) 20 MG TABS tablet [id number] No Take 1 tablet (20 mg total) by mouth daily  with supper.  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) 01/22/2022   MDD (major depressive disorder), recurrent severe, without psychosis (HCC) 11/03/2021   Cocaine use disorder (HCC) 11/01/2021   Diabetes mellitus due to underlying condition with hyperglycemia, with long-term current use of insulin (HCC) 03/03/2021   Status post laparoscopic hysterectomy 10/28/2018   Symptomatic anemia 06/13/2018   Essential hypertension 05/17/2018   Pancreatitis, chronic (HCC) 05/17/2018   Portal vein thrombosis 05/17/2018   Conditions to be addressed/monitored per PCP order:  HTN, DM, chronic pancreatitis, MDD, substance abuse   Care Plan : RN Care Manager Plan of Care  Updates made by Danie Chandler, RN since 04/28/2022 12:00 AM     Problem: Health Promotion or Disease Self-Management (General Plan of Care)      Long-Range Goal: Chronic Disease Management and Complex Care Coordination Needs   Start Date: 04/28/2022  Expected End Date: 07/28/2022  Priority: High  Note:   Current Barriers:  Knowledge Deficits related to plan of care for management of HTN, DM, chronic pancreatitis, MDD, substance abuse  Care Coordination needs related to Financial constraints, Limited social support, Transportation, Limited access to food, Housing barriers, Medication procurement, Mental Health Concerns , Substance abuse issues and Lacks knowledge of community resource:  Chronic Disease Management support and education needs related to HTN, DM, chronic pancreatitis, MDD, substance abuse  Lacks caregiver support Environmental consultant barriers Non-adherence to scheduled provider appointments Non-adherence to prescribed medication regimen Difficulty obtaining medications 04/28/22:  Patient with multiple SDOH needs identified today, multiple referrals  placed for, assistance.  Patient with mental health needs-referral placed.  Patient with medication needs-referral placed.  RNCM  Clinical Goal(s):  Patient will verbalize understanding of plan for management of HTN, DM, chronic pancreatitis, MDD, substance abuse as evidenced by patient report verbalize basic understanding of HTN, DM, chronic pancreatitis, MDD, substance abuse  disease process and self health management plan as evidenced by patient report take all medications exactly as prescribed and will call provider for medication related questions as evidenced by patient report demonstrate understanding of rationale for each prescribed medication as evidenced by patient report attend all scheduled medical appointments as evidenced by patient report and EMR review demonstrate improved  adherence to prescribed treatment plan for HTN, DM, chronic pancreatitis, MDD, substance abuse  as evidenced by patient report and EMR review continue to work with RN Care Manager to address care management and care coordination needs related to HTN, DM, chronic pancreatitis, MDD, substance abuse   as evidenced by adherence to CM Team Scheduled appointments work with pharmacist to address medication procurement related to  HTN, DM, chronic pancreatitis, MDD, substance abuse  as evidenced by review or EMR and patient or pharmacist report work with Child psychotherapist to address  related to the management of MDD related to the management of HTN, DM, chronic pancreatitis, substance abuse as evidenced by review of EMR and patient or Child psychotherapist report work with BSW  to address needs related to food insecurity, homelessness, PCP resources, dental resources  as evidenced by patient and/or BSW support through collaboration with Medical illustrator, provider, and care team.   Interventions: Inter-disciplinary care team collaboration (see longitudinal plan of care)  Evaluation of current treatment plan related to  self management and patient's adherence to plan as established by provider Collaborated with Pharmacist Pharmacist referral for medication  procurement and education Collaborated with BSW BSW referral for food insecurity, homelessness, dental and PCP resources Collaborated with LCSW LCSW referral for MDD-patient requests Psychiatrist referral, patient states she needs medication-virtual appt requested  Diabetes Interventions:  (Status:  New goal.) Long Term Goal Assessed patient's understanding of A1c goal: <7% Reviewed medications with patient and discussed importance of medication adherence Counseled on importance of regular laboratory monitoring as prescribed Discussed plans with patient for ongoing care management follow up and provided patient with direct contact information for care management team Reviewed scheduled/upcoming provider appointments, no upcoming appts Referral made to pharmacy team for assistance with medication procurement and education Referral made to social work team for assistance with Psychiatry appointment at patient request to obtain needed medication Referral made to BSW  for assistance with PCP and dental resources, food insecurity, homelessness issues Review of patient status, including review of consultants reports, relevant laboratory and other test results, and medications completed Assessed social determinant of health barriers Lab Results  Component Value Date   HGBA1C 7.2 (A) 03/09/2022  Hypertension Interventions:  (Status:  New goal.) Long Term Goal Last practice recorded BP readings:  BP Readings from Last 3 Encounters:  03/09/22 122/68  01/25/22 119/83  01/15/22 117/88  Most recent eGFR/CrCl:  Lab Results  Component Value Date   EGFR 110 09/02/2021    No components found for: "CRCL"  Evaluation of current treatment plan related to hypertension self management and patient's adherence to plan as established by provider Reviewed medications with patient and discussed importance of compliance Discussed plans with patient for ongoing care management follow up and provided patient  with direct contact information for care management team Reviewed scheduled/upcoming provider appointments, no appts Assessed social determinant of health barriers  Patient Goals/Self-Care Activities: Take all medications as prescribed Attend all scheduled provider appointments Call pharmacy for medication refills 3-7 days in advance of running out of medications Perform all self care activities independently  Perform IADL's (shopping, preparing meals, housekeeping, managing finances) independently Call provider office for new concerns or questions  Work with the social worker to address care coordination needs and will continue to work with the clinical team to address health care and disease management related needs  Follow Up Plan:  The patient has been provided with contact information for the care management team and has been advised to call with any health related questions or concerns.  The care management team will reach out to the patient again over the next 30 business  days.    Long-Range Goal: Establish Plan of Care for Chronic Disease Management Needsand Care Coordination Needs   Priority: High  Note:   Timeframe:  Long-Range Goal Priority:  High Start Date:   04/28/22                          Expected End Date: ongoing                      Follow Up Date 05/30/22    - practice safe sex - schedule appointment for vaccines needed due to my age or health - schedule recommended health tests (blood work, mammogram, colonoscopy, pap test) - schedule and keep appointment for annual check-up    Why is this important?   Screening tests can find diseases  early when they are easier to treat.  Your doctor or nurse will talk with you about which tests are important for you.  Getting shots for common diseases like the flu and shingles will help prevent them.     Follow Up:  Patient agrees to Care Plan and Follow-up.  Plan: The Managed Medicaid care management team will reach out  to the patient again over the next 30 business  days. and The  Patient has been provided with contact information for the Managed Medicaid care management team and has been advised to call with any health related questions or concerns.  Date/time of next scheduled RN care management/care coordination outreach: 05/30/22 at 230

## 2022-05-02 ENCOUNTER — Ambulatory Visit: Payer: Medicaid Other | Admitting: Licensed Clinical Social Worker

## 2022-05-02 ENCOUNTER — Telehealth: Payer: Self-pay | Admitting: Licensed Clinical Social Worker

## 2022-05-02 NOTE — Patient Outreach (Signed)
Care Coordination  05/02/2022  Amy Wolfe 10-18-1975 [id number]  [facility] LCSW completed outreach call to patient at 10:30 for scheduled SW appointment but was unable to reach patient. Voice message was left. [facility] LCSW completed second outreach attempt and was able to reach patient but she reports that she only has 4 percent and is not near her charger and will have to reschedule. Appointment was successfully rescheduled for 05/05/22 at 9 am.  Dickie La, BSW, MSW, LCSW Managed Medicaid LCSW [facility]  [facility] [email or web address] Phone: [phone]

## 2022-05-03 ENCOUNTER — Other Ambulatory Visit: Payer: Self-pay

## 2022-05-03 ENCOUNTER — Other Ambulatory Visit: Payer: Medicaid Other | Admitting: Pharmacist

## 2022-05-03 NOTE — Patient Instructions (Signed)
Goals Addressed             This Visit's Progress    Pharmacy Goals       Please contact [facility] at [phone] to schedule a follow up appointment with your doctor.  Please follow up with Congregational Nurse Clydie Braun at [facility] [phone]) regarding obtaining refills of your medications when needed.  Please reach out for any medication questions or concerns.  Thank you!  Estelle Grumbles, PharmD, [facility] [phone]

## 2022-05-03 NOTE — Progress Notes (Signed)
05/03/2022 Name: Amy Wolfe MRN: [id number] DOB: [birth year]  Chief Complaint  Patient presents with   Medication Access    Amy Wolfe is a 47 y.o. year old female who presented for a telephone visit.   They were referred to the pharmacist by their Case Management Team  for assistance in managing medication access.   Patient is participating in a Managed Medicaid Plan:  Yes, Healthy Blue  Subjective:  Care Team: Primary Care Provider: Margarita Mail, DO Managed Medicaid RN: Danie Chandler, RN; Next Scheduled Visit: 05/30/2022 Managed Medicaid Social Worker: Dickie La, [place]; Next Scheduled Visit: 05/05/2022 Managed Medicaid Social Worker: Gus Puma, [place]; Next Scheduled Visit: 05/04/2022  Medication Access/Adherence  Current Pharmacy:  [facility] [address] [place] [address] Phone: [phone] Fax: [phone]   Patient reports affordability concerns with their medications: Yes  Patient reports access/transportation concerns to their pharmacy: Yes  Patient reports adherence concerns with their medications:  Yes    Receive message from Managed Medicaid RNCM advising patient has been out of her medications for 2 months, is currently homeless and has been unable to obtain her medications from the pharmacy due to lack of transportation.  Patient reports she goes to [facility] on [address] in [place] and collects her mail from this address.   Objective:  Lab Results  Component Value Date   HGBA1C 7.2 (A) 03/09/2022    Lab Results  Component Value Date   CREATININE 0.79 01/23/2022   BUN 20 01/23/2022   NA 141 01/23/2022   K 4.4 01/23/2022   CL 102 01/23/2022   CO2 29 01/23/2022    Lab Results  Component Value Date   TRIG 127.0 10/05/2020   BP Readings from Last 3 Encounters:  03/09/22 122/68  01/31/22 113/86  01/25/22 119/83   Pulse Readings from Last 3  Encounters:  03/09/22 (!) 101  01/31/22 83  01/25/22 86     Medications Reviewed Today     Reviewed by Danie Chandler, RN (Registered Nurse) on 04/28/22 at 1338  Med List Status: <None>   Medication Order Taking? Sig Documenting Provider Last Dose Status Informant  Blood Glucose Monitoring Suppl (BLOOD GLUCOSE MONITOR SYSTEM) w/Device KIT [id number] No 1 each by Does not apply route in the morning, at noon, and at bedtime.  Patient not taking: Reported on 04/28/2022   Margarita Mail, DO Not Taking Active   Glucose Blood (BLOOD GLUCOSE TEST STRIPS) STRP [id number] No 1 each by In Vitro route in the morning, at noon, and at bedtime. May substitute to any manufacturer covered by patient's insurance.  Patient not taking: Reported on 04/28/2022   Margarita Mail, DO Not Taking Active   insulin aspart (NOVOLOG FLEXPEN) 100 UNIT/ML FlexPen [id number] No Inject 3 Units into the skin 3 (three) times daily with meals.  Patient not taking: Reported on 03/09/2022   Clapacs, Jackquline Denmark, MD Not Taking Active Mother, Pharmacy Records, Other  insulin glargine-yfgn [facility]) 100 UNIT/ML Pen [id number] No Inject 4 Units into the skin daily.  Patient not taking: Reported on 04/28/2022   Margarita Mail, DO Not Taking Active   lipase/protease/amylase (CREON) 36000 UNITS CPEP capsule [id number] No Take 2 capsules (72,000 Units total) by mouth 3 (three) times daily with meals.  Patient not taking: Reported on 04/28/2022   Margarita Mail, DO Not Taking Active   mirtazapine (REMERON) 30 MG tablet [id number] No Take 1 tablet (30 mg total) by  mouth at bedtime.  Patient not taking: Reported on 04/28/2022   Margarita Mail, DO Not Taking Active   ondansetron (ZOFRAN-ODT) 8 MG disintegrating tablet [id number] No Take 1 tablet (8 mg total) by mouth every 8 (eight) hours as needed for nausea or vomiting.  Patient not taking: Reported on 04/28/2022   Margarita Mail, DO Not Taking Active   pantoprazole (PROTONIX)  40 MG tablet [id number] No Take 1 tablet (40 mg total) by mouth daily.  Patient not taking: Reported on 04/28/2022   Margarita Mail, DO Not Taking Active   rivaroxaban (XARELTO) 20 MG TABS tablet [id number] No Take 1 tablet (20 mg total) by mouth daily with supper.  Patient not taking: Reported on 04/28/2022   Margarita Mail, DO Not Taking Active               Assessment/Plan:   Outreach to [facility] today and speak with [facility] Congregational Nurse Dayna Barker. Clydie Braun advises she has worked with patient and note in February she helped patient with scheduling appointment with PCP. Clydie Braun offers to assist with picking up medications from pharmacy for patient and delivering these to her at [facility] day center  Collaborate with patient and Pharmacist Havery Moros at [facility] regarding refilling prescriptions for patient, including blood sugar testing supplies, Semglee, Creon, mirtazapine, ondansetron, pantoprazole and Xarelto. Danford Bad collaborates with Congregational Nurse Dayna Barker regarding pick up of medications for patient - Patient plans to follow up with Nurse Clydie Braun to pick up these medications on Monday at [facility]  Advise patient to contact PCP office to schedule a follow up appointment with PCP  Will collaborate with Managed Medicaid team and PCP to provide update  Follow Up Plan: Clinical Pharmacist will follow up with patient by telephone again within the next 30 days  Estelle Grumbles, PharmD, [facility] [phone]

## 2022-05-04 ENCOUNTER — Other Ambulatory Visit: Payer: Medicaid Other

## 2022-05-04 ENCOUNTER — Other Ambulatory Visit: Payer: Self-pay

## 2022-05-04 MED ORDER — TECHLITE PEN NEEDLES 32G X 4 MM MISC
0 refills | Status: AC
  Filled 2022-05-04: qty 100, 90d supply, fill #0

## 2022-05-04 NOTE — Patient Instructions (Signed)
  Medicaid Managed Care   Unsuccessful Outreach Note  05/04/2022 Name: Amy Wolfe MRN: [id number] DOB: 03-Apr-1975  Referred by: Margarita Mail, DO Reason for referral : High Risk Managed Medicaid (MM social work telephone outreach )   An unsuccessful telephone outreach was attempted today. The patient was referred to the case management team for assistance with care management and care coordination.   Follow Up Plan: A HIPAA compliant phone message was left for the patient providing contact information and requesting a return call.   Abelino Derrick, MHA [facility]  Managed [facility] Social Worker [phone]

## 2022-05-04 NOTE — Patient Outreach (Signed)
  Medicaid Managed Care   Unsuccessful Outreach Note  05/04/2022 Name: Amy Wolfe MRN: [id number] DOB: 10/12/1975  Referred by: Andrews, Elisabeth, DO Reason for referral : High Risk Managed Medicaid (MM social work telephone outreach )   An unsuccessful telephone outreach was attempted today. The patient was referred to the case management team for assistance with care management and care coordination.   Follow Up Plan: A HIPAA compliant phone message was left for the patient providing contact information and requesting a return call.   Rayden Dock, BSW, MHA [employer]  Managed Medicaid Social Worker [phone]  

## 2022-05-04 NOTE — Patient Outreach (Signed)
BSW completed a telephone call with [name] from [place]. She states it will take a year for patient to get into independent housing, but she can get her into a group home. [name] would like to have a 3 way call with patient and BSW.    Abelino Derrick, MHA [facility]  Managed [facility] Social Worker [phone]

## 2022-05-05 ENCOUNTER — Other Ambulatory Visit: Payer: Medicaid Other | Admitting: Licensed Clinical Social Worker

## 2022-05-05 ENCOUNTER — Ambulatory Visit: Payer: Medicaid Other

## 2022-05-05 NOTE — Patient Instructions (Signed)
[name] ,   [facility] Managed Care Team is available to provide assistance to you with your healthcare needs at no cost and as a benefit of your [facility] Health plan. I'm sorry I was unable to reach you today for our scheduled appointment. Our care guide will call you to reschedule our telephone appointment. Please call me at the number below. I am available to be of assistance to you regarding your healthcare needs. .   Thank you,   Dickie La, BSW, MSW, LCSW Managed Medicaid LCSW [facility]  [address] [email or web address] Phone: [phone]

## 2022-05-05 NOTE — Patient Outreach (Signed)
  Medicaid Managed Care   Unsuccessful Attempt Note   05/05/2022 Name: Amy Wolfe MRN: [id number] DOB: 25-Jun-1975  Referred by: Margarita Mail, DO Reason for referral : No chief complaint on file.   An unsuccessful telephone outreach was attempted today. The patient was referred to the case management team for assistance with care management and care coordination.  Two calls were made on 9 am and at 1 pm.  Follow Up Plan: A HIPAA compliant phone message was left for the patient providing contact information and requesting a return call.   Dickie La, BSW, MSW, [employer] Managed Medicaid LCSW [facility]  [facility] [email or web address] Phone: [phone]

## 2022-05-12 ENCOUNTER — Other Ambulatory Visit: Payer: Medicaid Other | Admitting: Licensed Clinical Social Worker

## 2022-05-12 NOTE — Patient Outreach (Signed)
  Medicaid Managed Care   Unsuccessful Attempt Note   05/12/2022 Name: Amy Wolfe MRN: [id number] DOB: 10-Apr-1975  Referred by: Margarita Mail, DO Reason for referral : No chief complaint on file.   Third unsuccessful telephone outreach was attempted today. The patient was referred to the case management team for assistance with care management and care coordination. The patient's primary care provider has been notified of our unsuccessful attempts to make or maintain contact with the patient. The care management team is pleased to engage with this patient at any time in the future should he/she be interested in assistance from the care management team.    Follow Up Plan: A HIPAA compliant phone message was left for the patient providing contact information and requesting a return call. Email sent too.   Dickie La, BSW, MSW, [employer] Managed Medicaid LCSW [facility]  [facility] [email or web address] Phone: [phone]

## 2022-05-12 NOTE — Patient Instructions (Signed)
[name] ,   [facility] Managed Care Team is available to provide assistance to you with your healthcare needs at no cost and as a benefit of your [facility] Health plan. We have been unable to reach you on 3 separate attempts. The care management team is available to assist with your healthcare needs at any time. Please do not hesitate to contact me at the number below. .   Thank you,   Dickie La, BSW, MSW, LCSW Managed Medicaid LCSW [facility]  [email or web address] Phone: [phone]

## 2022-05-16 ENCOUNTER — Telehealth: Payer: Self-pay

## 2022-05-16 NOTE — Telephone Encounter (Signed)
..   Medicaid Managed Care   Unsuccessful Outreach Note  05/16/2022 Name: Amy Wolfe MRN: [id number] DOB: [birth year]  Referred by: Margarita Mail, DO Reason for referral : Appointment (I called the patient to get her missed phone appointments with the MM BSW and LCSW rescheduled. I had to leave a message on her voicemail.)   Third unsuccessful telephone outreach was attempted today. The patient was referred to the case management team for assistance with care management and care coordination. The patient's primary care provider has been notified of our unsuccessful attempts to make or maintain contact with the patient. The care management team is pleased to engage with this patient at any time in the future should he/she be interested in assistance from the care management team. This was the 4th unsuccessful call.  Follow Up Plan: We have been unable to make contact with the patient for follow up. The care management team is available to follow up with the patient after provider conversation with the patient regarding recommendation for care management engagement and subsequent re-referral to the care management team.   Weston Settle Care Guide  [facility] Managed  Care Guide [facility]  [phone]

## 2022-05-30 ENCOUNTER — Other Ambulatory Visit: Payer: Medicaid Other | Admitting: Obstetrics and Gynecology

## 2022-05-30 NOTE — Patient Outreach (Signed)
Care Coordination  05/30/2022  Raniesha Hurney [birth year] [id number]  Medicaid Managed Care   Unsuccessful Outreach Note  05/30/2022 Name: Ayelene Bullinger MRN: [id number] DOB: 1975-03-11  Referred by: Margarita Mail, DO Reason for referral : High Risk Managed Medicaid (Unsuccessful telephone outreach)  Third unsuccessful telephone outreach was attempted. The patient was referred to the case management team for assistance with care management and care coordination. The patient's primary care provider has been notified of our unsuccessful attempts to make or maintain contact with the patient. The care management team is pleased to engage with this patient at any time in the future should he/she be interested in assistance from the care management team.   Follow Up Plan: The patient has been provided with contact information for the care management team and has been advised to call with any health related questions or concerns.  We have been unable to make contact with the patient for follow up. The care management team is available to follow up with the patient after provider conversation with the patient regarding recommendation for care management engagement and subsequent re-referral to the care management team.   Kathi Der RN, BSN [place]  [facility] Care Management Coordinator - Managed [place] High Risk [phone]

## 2022-05-30 NOTE — Patient Instructions (Signed)
Hi Amy Wolfe, I am sorry we have been unable to reach you- as a part of your Medicaid benefit, you are eligible for care management and care coordination services at no cost or copay. We have unable to reach you by phone but would be happy to help you with your health related needs. Please feel free to call me at [phone].  Amy Der RN, BSN [place]  [facility] [employer] - Managed Medicaid High Risk [phone]

## 2022-05-31 ENCOUNTER — Other Ambulatory Visit: Payer: Medicaid Other | Admitting: Pharmacist

## 2022-05-31 ENCOUNTER — Telehealth: Payer: Self-pay | Admitting: Pharmacist

## 2022-05-31 NOTE — Progress Notes (Signed)
   05/31/2022  Patient ID: Amy Wolfe, female   DOB: Sep 17, 1975, 47 y.o.   MRN: [id number]  Outreach to patient today for scheduled appointment.  Reach patient, but she states that she is waiting for a call from Managed Medicaid RN Kathi Der. Advise patient that Managed Medicaid RN tried to reach her yesterday and offer patient a call back number for Terri, but patient hangs up.  Collaborate with Managed Medicaid RN.  Plan: Will attempt to reach patient by telephone again within the next 30 days.  Estelle Grumbles, PharmD, [facility] [phone]

## 2022-06-21 ENCOUNTER — Telehealth: Payer: Self-pay | Admitting: Pharmacist

## 2022-06-21 ENCOUNTER — Other Ambulatory Visit: Payer: Medicaid Other | Admitting: Pharmacist

## 2022-06-21 NOTE — Progress Notes (Signed)
   Outreach Note  06/21/2022 Name: Amy Wolfe MRN: [id number] DOB: [birth year]  Referred by: Margarita Mail, DO Reason for referral : No chief complaint on file.   Was unable to reach patient via telephone today and have left HIPAA compliant voicemail asking patient to return my call.   Follow Up Plan: Will attempt to reach patient by telephone again within the next 30 days  Estelle Grumbles, PharmD, [facility] Clinical Pharmacist [facility] [phone]

## 2022-07-05 ENCOUNTER — Other Ambulatory Visit: Payer: Medicaid Other | Admitting: Pharmacist

## 2022-07-05 ENCOUNTER — Telehealth: Payer: Self-pay | Admitting: Pharmacist

## 2022-07-05 NOTE — Progress Notes (Signed)
   Outreach Note  07/05/2022 Name: Amy Wolfe MRN: [id number] DOB: [birth year]  Referred by: Margarita Mail, DO Reason for referral : No chief complaint on file.   Was unable to reach patient via telephone today and have left HIPAA compliant voicemail asking patient to return my call. Outreach attempt #2.   Follow Up Plan: Will attempt to reach patient by telephone again within the next 30 days  Estelle Grumbles, PharmD, [facility] Clinical Pharmacist [facility] [phone]

## 2022-08-02 ENCOUNTER — Other Ambulatory Visit: Payer: Medicaid Other | Admitting: Pharmacist

## 2022-08-02 NOTE — Patient Instructions (Signed)
Goals Addressed             This Visit's Progress    Pharmacy Goals       Please consider contacting [facility] at [phone] to schedule a follow up appointment with your doctor.  Please follow up with Congregational Nurse Clydie Braun at [facility] and [facility] [phone]) regarding obtaining refills of your medications when needed.  Please reach out for any medication questions or concerns.  Thank you!   Estelle Grumbles, PharmD, [facility] [phone]

## 2022-08-02 NOTE — Progress Notes (Addendum)
   08/02/2022  Patient ID: Amy Wolfe, female   DOB: September 24, 1975, 47 y.o.   MRN: [id number]  Amy Wolfe is a 47 y.o. year old female who presented for a telephone visit.   They were referred to the pharmacist by their Case Management Team  for assistance in managing medication access.    Patient is participating in a Managed Medicaid Plan:  Yes, reports now changed to [name]  From review of chart, note patient has not scheduled follow up with PCP. Last seen in office on 03/09/2022. Also note Managed Medicaid team (Nursing and Social Workers) have been unable to get in touch with patient.  Today patient shares that she has not scheduled follow up with PCP because she plans to switch to a new PCP.  - States would like help from [facility] team with finding a new PCP that is accepting patients. Says that she previously missed calls from the team because her phone was not working, but that it is back on now. - Provide patient with phone numbers for Managed Medicaid team members as requested. Also provide patient with phone number for current PCP office  Patient also asks about having a continuous glucose monitor (CGM). Requests message be sent to Dr. Caralee Ates to ask if provider is willing to prescribe this for her.  Plan:  1) Will collaborate with Managed Medicaid team to let team know that patient reports previously had difficulty with her phone, but is requesting outreach and help with finding a new PCP that is accepting patients with her insurance  2) Send message to PCP regarding request from patient for continuous glucose monitor  3) Patient denies further medication questions or concerns today Provide patient with contact information for clinic pharmacist to contact if needed in future for medication questions/concerns   Estelle Grumbles, PharmD, [facility] [phone]

## 2022-08-03 ENCOUNTER — Other Ambulatory Visit: Payer: Self-pay | Admitting: Internal Medicine

## 2022-08-03 ENCOUNTER — Other Ambulatory Visit: Payer: Self-pay

## 2022-08-03 DIAGNOSIS — E0865 Diabetes mellitus due to underlying condition with hyperglycemia: Secondary | ICD-10-CM

## 2022-08-03 MED ORDER — FREESTYLE LIBRE 3 SENSOR MISC
1.0000 | 6 refills | Status: AC
  Filled 2022-08-03 – 2022-08-09 (×2): qty 1, 14d supply, fill #0

## 2022-08-09 ENCOUNTER — Other Ambulatory Visit: Payer: Self-pay

## 2022-08-10 ENCOUNTER — Telehealth: Payer: Self-pay

## 2022-08-10 NOTE — Telephone Encounter (Signed)
..   Medicaid Managed Care   Unsuccessful Outreach Note  08/10/2022 Name: Amy Wolfe MRN: [id number] DOB: 09/15/1975  Referred by: Margarita Mail, DO Reason for referral : Appointment   An unsuccessful telephone outreach was attempted today. The patient was referred to the case management team for assistance with care management and care coordination.   Follow Up Plan: A HIPAA compliant phone message was left for the patient providing contact information and requesting a return call.  The care management team will reach out to the patient again over the next 7 days.   Weston Settle Care Guide  [facility] Managed  Care Guide [facility]  [phone]

## 2022-08-18 ENCOUNTER — Other Ambulatory Visit: Payer: Self-pay

## 2022-08-22 ENCOUNTER — Other Ambulatory Visit: Payer: Medicaid Other

## 2022-08-22 NOTE — Patient Instructions (Signed)
  Medicaid Managed Care   Unsuccessful Outreach Note  08/22/2022 Name: Amy Wolfe MRN: [id number] DOB: [birth year]  Referred by: Margarita Mail, DO Reason for referral : High Risk Managed Medicaid (MM Social work unsuccessful telephone outreach )   An unsuccessful telephone outreach was attempted today. The patient was referred to the case management team for assistance with care management and care coordination.   Follow Up Plan: A HIPAA compliant phone message was left for the patient providing contact information and requesting a return call.   Abelino Derrick, MHA [facility]  Managed [facility] Social Worker [phone]

## 2022-08-22 NOTE — Patient Outreach (Signed)
BSW contacted patient and she answered the phone, but call was disconnected. Contacted patient back and left a voicemail.    Medicaid Managed Care   Unsuccessful Outreach Note  08/22/2022 Name: Amy Wolfe MRN: [id number] DOB: 03-27-1975  Referred by: Margarita Mail, DO Reason for referral : High Risk Managed Medicaid (MM Social work unsuccessful telephone outreach )   An unsuccessful telephone outreach was attempted today. The patient was referred to the case management team for assistance with care management and care coordination.   Follow Up Plan: A HIPAA compliant phone message was left for the patient providing contact information and requesting a return call.   Abelino Derrick, MHA [facility]  Managed [facility] Social Worker [phone]

## 2022-09-15 ENCOUNTER — Other Ambulatory Visit: Payer: Medicaid Other

## 2022-09-15 NOTE — Patient Outreach (Signed)
Medicaid Managed Care   Unsuccessful Outreach Note  09/15/2022 Name: Amy Wolfe MRN: [id number] DOB: 27-Jul-1975  Referred by: Margarita Mail, DO Reason for referral : High Risk Managed Medicaid (MM social work unsuccessful telephone outreach )   Third unsuccessful telephone outreach was attempted today. The patient was referred to the case management team for assistance with care management and care coordination. The patient's primary care provider has been notified of our unsuccessful attempts to make or maintain contact with the patient. The care management team is pleased to engage with this patient at any time in the future should he/she be interested in assistance from the care management team.   Follow Up Plan: The patient has been provided with contact information for the care management team and has been advised to call with any health related questions or concerns.   Abelino Derrick, MHA [facility]  Managed [facility] Social Worker [phone]

## 2022-09-15 NOTE — Patient Instructions (Addendum)
Medicaid Managed Care   Unsuccessful Outreach Note  09/15/2022 Name: Alizeah Quintiliani MRN: [id number] DOB: 27-May-1975  Referred by: Margarita Mail, DO Reason for referral : High Risk Managed Medicaid (MM social work unsuccessful telephone outreach )   An unsuccessful telephone outreach was attempted today. The patient was referred to the case management team for assistance with care management and care coordination.   Follow Up Plan: A HIPAA compliant phone message was left for the patient providing contact information and requesting a return call.   Abelino Derrick, MHA [facility]  Managed Medicaid Social Worker [phone]  Medicaid Managed Care   Unsuccessful Outreach Note  09/15/2022 Name: Deneice Brautigan MRN: [id number] DOB: 25-Jul-1975  Referred by: Margarita Mail, DO Reason for referral : High Risk Managed Medicaid (MM social work unsuccessful telephone outreach )   Third unsuccessful telephone outreach was attempted today. The patient was referred to the case management team for assistance with care management and care coordination. The patient's primary care provider has been notified of our unsuccessful attempts to make or maintain contact with the patient. The care management team is pleased to engage with this patient at any time in the future should he/she be interested in assistance from the care management team.   Follow Up Plan: The patient has been provided with contact information for the care management team and has been advised to call with any health related questions or concerns.   Abelino Derrick, MHA [facility]  Managed [facility] Social Worker [phone]

## 2022-09-15 NOTE — Patient Outreach (Signed)
Medicaid Managed Care   Unsuccessful Outreach Note  09/15/2022 Name: Amy Wolfe MRN: [id number] DOB: 1975-05-25  Referred by: Margarita Mail, DO Reason for referral : High Risk Managed Medicaid (MM social work unsuccessful telephone outreach )   An unsuccessful telephone outreach was attempted today. The patient was referred to the case management team for assistance with care management and care coordination.   Follow Up Plan: A HIPAA compliant phone message was left for the patient providing contact information and requesting a return call.   Abelino Derrick, MHA [facility]  Managed [facility] Social Worker [phone]

## 2022-10-13 ENCOUNTER — Other Ambulatory Visit: Payer: Medicaid Other

## 2023-02-08 ENCOUNTER — Other Ambulatory Visit: Payer: Self-pay
